# Patient Record
Sex: Female | Born: 1945 | Race: White | Hispanic: No | Smoking: Former smoker
Health system: Southern US, Community
[De-identification: ages and names within clinical notes are randomized; demographics above are authoritative.]

## PROBLEM LIST (undated history)

## (undated) ENCOUNTER — Emergency Department (HOSPITAL_COMMUNITY): Admission: EM | Discharge status: Absent without leave | Payer: Medicare Other

## (undated) DIAGNOSIS — E079 Disorder of thyroid, unspecified: Secondary | ICD-10-CM

## (undated) DIAGNOSIS — M1710 Unilateral primary osteoarthritis, unspecified knee: Secondary | ICD-10-CM

## (undated) DIAGNOSIS — E039 Hypothyroidism, unspecified: Secondary | ICD-10-CM

## (undated) DIAGNOSIS — Z87442 Personal history of urinary calculi: Secondary | ICD-10-CM

## (undated) DIAGNOSIS — R112 Nausea with vomiting, unspecified: Secondary | ICD-10-CM

## (undated) DIAGNOSIS — Z803 Family history of malignant neoplasm of breast: Secondary | ICD-10-CM

## (undated) DIAGNOSIS — Z800 Family history of malignant neoplasm of digestive organs: Secondary | ICD-10-CM

## (undated) DIAGNOSIS — Z96659 Presence of unspecified artificial knee joint: Secondary | ICD-10-CM

## (undated) DIAGNOSIS — C50411 Malignant neoplasm of upper-outer quadrant of right female breast: Secondary | ICD-10-CM

## (undated) DIAGNOSIS — H6690 Otitis media, unspecified, unspecified ear: Secondary | ICD-10-CM

## (undated) DIAGNOSIS — G8929 Other chronic pain: Secondary | ICD-10-CM

## (undated) DIAGNOSIS — E559 Vitamin D deficiency, unspecified: Secondary | ICD-10-CM

## (undated) DIAGNOSIS — Z923 Personal history of irradiation: Secondary | ICD-10-CM

## (undated) DIAGNOSIS — T7840XA Allergy, unspecified, initial encounter: Secondary | ICD-10-CM

## (undated) DIAGNOSIS — C4492 Squamous cell carcinoma of skin, unspecified: Secondary | ICD-10-CM

## (undated) DIAGNOSIS — M1990 Unspecified osteoarthritis, unspecified site: Secondary | ICD-10-CM

## (undated) DIAGNOSIS — R011 Cardiac murmur, unspecified: Secondary | ICD-10-CM

## (undated) DIAGNOSIS — Z9889 Other specified postprocedural states: Secondary | ICD-10-CM

## (undated) DIAGNOSIS — C801 Malignant (primary) neoplasm, unspecified: Secondary | ICD-10-CM

## (undated) DIAGNOSIS — C50919 Malignant neoplasm of unspecified site of unspecified female breast: Secondary | ICD-10-CM

## (undated) DIAGNOSIS — Z807 Family history of other malignant neoplasms of lymphoid, hematopoietic and related tissues: Secondary | ICD-10-CM

## (undated) DIAGNOSIS — Z170 Estrogen receptor positive status [ER+]: Secondary | ICD-10-CM

## (undated) DIAGNOSIS — J302 Other seasonal allergic rhinitis: Secondary | ICD-10-CM

## (undated) DIAGNOSIS — Z8041 Family history of malignant neoplasm of ovary: Secondary | ICD-10-CM

## (undated) DIAGNOSIS — Z836 Family history of other diseases of the respiratory system: Secondary | ICD-10-CM

## (undated) DIAGNOSIS — Z1379 Encounter for other screening for genetic and chromosomal anomalies: Secondary | ICD-10-CM

## (undated) HISTORY — PX: CATARACT EXTRACTION, BILATERAL: SHX1313

## (undated) HISTORY — DX: Malignant neoplasm of upper-outer quadrant of right female breast: C50.411

## (undated) HISTORY — DX: Family history of other malignant neoplasms of lymphoid, hematopoietic and related tissues: Z80.7

## (undated) HISTORY — DX: Otitis media, unspecified, unspecified ear: H66.90

## (undated) HISTORY — DX: Malignant neoplasm of unspecified site of unspecified female breast: C50.919

## (undated) HISTORY — DX: Estrogen receptor positive status (ER+): Z17.0

## (undated) HISTORY — DX: Family history of malignant neoplasm of ovary: Z80.41

## (undated) HISTORY — DX: Family history of other diseases of the respiratory system: Z83.6

## (undated) HISTORY — DX: Other chronic pain: G89.29

## (undated) HISTORY — DX: Presence of unspecified artificial knee joint: Z96.659

## (undated) HISTORY — DX: Unilateral primary osteoarthritis, unspecified knee: M17.10

## (undated) HISTORY — PX: TONSILLECTOMY: SUR1361

## (undated) HISTORY — DX: Vitamin D deficiency, unspecified: E55.9

## (undated) HISTORY — DX: Disorder of thyroid, unspecified: E07.9

## (undated) HISTORY — DX: Hypothyroidism, unspecified: E03.9

## (undated) HISTORY — DX: Encounter for other screening for genetic and chromosomal anomalies: Z13.79

## (undated) HISTORY — DX: Family history of malignant neoplasm of digestive organs: Z80.0

## (undated) HISTORY — DX: Malignant (primary) neoplasm, unspecified: C80.1

## (undated) HISTORY — DX: Family history of malignant neoplasm of breast: Z80.3

## (undated) HISTORY — DX: Cardiac murmur, unspecified: R01.1

## (undated) HISTORY — DX: Morbid (severe) obesity due to excess calories: E66.01

---
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
## 1999-04-22 ENCOUNTER — Other Ambulatory Visit: Admission: RE | Admit: 1999-04-22 | Discharge: 1999-04-22 | Payer: Self-pay | Admitting: Obstetrics and Gynecology

## 2000-07-20 ENCOUNTER — Other Ambulatory Visit: Admission: RE | Admit: 2000-07-20 | Discharge: 2000-07-20 | Payer: Self-pay | Admitting: Obstetrics and Gynecology

## 2001-09-08 ENCOUNTER — Other Ambulatory Visit: Admission: RE | Admit: 2001-09-08 | Discharge: 2001-09-08 | Payer: Self-pay | Admitting: Obstetrics and Gynecology

## 2003-08-29 ENCOUNTER — Encounter (HOSPITAL_COMMUNITY): Admission: RE | Admit: 2003-08-29 | Discharge: 2003-11-27 | Payer: Self-pay | Admitting: Radiology

## 2003-09-14 HISTORY — PX: BREAST LUMPECTOMY: SHX2

## 2003-09-27 ENCOUNTER — Encounter: Admission: RE | Admit: 2003-09-27 | Discharge: 2003-09-27 | Payer: Self-pay | Admitting: Surgery

## 2003-09-27 ENCOUNTER — Ambulatory Visit (HOSPITAL_COMMUNITY): Admission: RE | Admit: 2003-09-27 | Discharge: 2003-09-27 | Payer: Self-pay | Admitting: Surgery

## 2003-10-01 ENCOUNTER — Ambulatory Visit (HOSPITAL_BASED_OUTPATIENT_CLINIC_OR_DEPARTMENT_OTHER): Admission: RE | Admit: 2003-10-01 | Discharge: 2003-10-01 | Payer: Self-pay | Admitting: Surgery

## 2003-10-01 ENCOUNTER — Ambulatory Visit (HOSPITAL_COMMUNITY): Admission: RE | Admit: 2003-10-01 | Discharge: 2003-10-01 | Payer: Self-pay | Admitting: Surgery

## 2003-11-06 ENCOUNTER — Ambulatory Visit: Admission: RE | Admit: 2003-11-06 | Discharge: 2003-12-26 | Payer: Self-pay | Admitting: *Deleted

## 2003-12-28 HISTORY — PX: OTHER SURGICAL HISTORY: SHX169

## 2004-03-27 ENCOUNTER — Encounter: Admission: RE | Admit: 2004-03-27 | Discharge: 2004-03-27 | Payer: Self-pay | Admitting: Surgery

## 2004-05-19 ENCOUNTER — Ambulatory Visit: Payer: Self-pay | Admitting: Oncology

## 2004-08-25 ENCOUNTER — Encounter: Admission: RE | Admit: 2004-08-25 | Discharge: 2004-08-25 | Payer: Self-pay | Admitting: Oncology

## 2004-11-14 ENCOUNTER — Ambulatory Visit: Payer: Self-pay | Admitting: Oncology

## 2004-11-17 ENCOUNTER — Ambulatory Visit (HOSPITAL_COMMUNITY): Admission: RE | Admit: 2004-11-17 | Discharge: 2004-11-17 | Payer: Self-pay | Admitting: Oncology

## 2005-03-30 ENCOUNTER — Encounter: Admission: RE | Admit: 2005-03-30 | Discharge: 2005-03-30 | Payer: Self-pay | Admitting: Oncology

## 2005-11-02 ENCOUNTER — Encounter: Admission: RE | Admit: 2005-11-02 | Discharge: 2005-11-02 | Payer: Self-pay | Admitting: Oncology

## 2005-11-05 ENCOUNTER — Ambulatory Visit: Payer: Self-pay | Admitting: Oncology

## 2005-11-10 LAB — CBC WITH DIFFERENTIAL/PLATELET
Basophils Absolute: 0 10*3/uL (ref 0.0–0.1)
Eosinophils Absolute: 0.1 10*3/uL (ref 0.0–0.5)
HGB: 13.9 g/dL (ref 11.6–15.9)
LYMPH%: 16.6 % (ref 14.0–48.0)
MCV: 87.9 fL (ref 81.0–101.0)
MONO#: 0.5 10*3/uL (ref 0.1–0.9)
MONO%: 8 % (ref 0.0–13.0)
NEUT#: 4.7 10*3/uL (ref 1.5–6.5)
Platelets: 192 10*3/uL (ref 145–400)
WBC: 6.4 10*3/uL (ref 3.9–10.0)

## 2005-11-10 LAB — COMPREHENSIVE METABOLIC PANEL
Albumin: 4.2 g/dL (ref 3.5–5.2)
Alkaline Phosphatase: 76 U/L (ref 39–117)
BUN: 18 mg/dL (ref 6–23)
CO2: 25 mEq/L (ref 19–32)
Glucose, Bld: 98 mg/dL (ref 70–99)
Potassium: 4.5 mEq/L (ref 3.5–5.3)
Total Bilirubin: 1 mg/dL (ref 0.3–1.2)
Total Protein: 6.2 g/dL (ref 6.0–8.3)

## 2006-05-18 ENCOUNTER — Encounter: Admission: RE | Admit: 2006-05-18 | Discharge: 2006-05-18 | Payer: Self-pay | Admitting: Oncology

## 2006-08-23 ENCOUNTER — Ambulatory Visit (HOSPITAL_COMMUNITY): Admission: RE | Admit: 2006-08-23 | Discharge: 2006-08-24 | Payer: Self-pay | Admitting: General Surgery

## 2006-08-23 ENCOUNTER — Encounter (INDEPENDENT_AMBULATORY_CARE_PROVIDER_SITE_OTHER): Payer: Self-pay | Admitting: Specialist

## 2006-08-23 HISTORY — PX: APPENDECTOMY: SHX54

## 2006-11-04 ENCOUNTER — Encounter: Admission: RE | Admit: 2006-11-04 | Discharge: 2006-11-04 | Payer: Self-pay | Admitting: Family Medicine

## 2006-11-05 ENCOUNTER — Ambulatory Visit: Payer: Self-pay | Admitting: Oncology

## 2006-11-10 LAB — CBC WITH DIFFERENTIAL/PLATELET
Basophils Absolute: 0 10*3/uL (ref 0.0–0.1)
Eosinophils Absolute: 0.1 10*3/uL (ref 0.0–0.5)
HGB: 14 g/dL (ref 11.6–15.9)
MCV: 87.5 fL (ref 81.0–101.0)
MONO#: 0.8 10*3/uL (ref 0.1–0.9)
MONO%: 10.8 % (ref 0.0–13.0)
NEUT#: 4.9 10*3/uL (ref 1.5–6.5)
RBC: 4.55 10*6/uL (ref 3.70–5.32)
RDW: 13.9 % (ref 11.3–14.5)
WBC: 7.1 10*3/uL (ref 3.9–10.0)

## 2006-11-10 LAB — COMPREHENSIVE METABOLIC PANEL
Albumin: 4.4 g/dL (ref 3.5–5.2)
Alkaline Phosphatase: 78 U/L (ref 39–117)
BUN: 16 mg/dL (ref 6–23)
CO2: 26 mEq/L (ref 19–32)
Calcium: 8.8 mg/dL (ref 8.4–10.5)
Chloride: 105 mEq/L (ref 96–112)
Glucose, Bld: 96 mg/dL (ref 70–99)
Potassium: 4.4 mEq/L (ref 3.5–5.3)
Sodium: 143 mEq/L (ref 135–145)
Total Protein: 6.4 g/dL (ref 6.0–8.3)

## 2006-11-10 LAB — CANCER ANTIGEN 27.29: CA 27.29: 18 U/mL (ref 0–39)

## 2006-11-30 ENCOUNTER — Other Ambulatory Visit: Admission: RE | Admit: 2006-11-30 | Discharge: 2006-11-30 | Payer: Self-pay | Admitting: Family Medicine

## 2007-11-08 ENCOUNTER — Encounter: Admission: RE | Admit: 2007-11-08 | Discharge: 2007-11-08 | Payer: Self-pay | Admitting: Oncology

## 2007-11-21 ENCOUNTER — Ambulatory Visit: Payer: Self-pay | Admitting: Oncology

## 2007-11-23 LAB — CBC WITH DIFFERENTIAL/PLATELET
BASO%: 0.8 % (ref 0.0–2.0)
Basophils Absolute: 0.1 10*3/uL (ref 0.0–0.1)
EOS%: 1.9 % (ref 0.0–7.0)
Eosinophils Absolute: 0.1 10*3/uL (ref 0.0–0.5)
HCT: 41 % (ref 34.8–46.6)
HGB: 14.2 g/dL (ref 11.6–15.9)
LYMPH%: 25 % (ref 14.0–48.0)
MCH: 30.4 pg (ref 26.0–34.0)
MCHC: 34.6 g/dL (ref 32.0–36.0)
MCV: 87.9 fL (ref 81.0–101.0)
MONO#: 0.6 10*3/uL (ref 0.1–0.9)
MONO%: 8.7 % (ref 0.0–13.0)
NEUT#: 4.4 10*3/uL (ref 1.5–6.5)
NEUT%: 63.6 % (ref 39.6–76.8)
Platelets: 202 10*3/uL (ref 145–400)
RBC: 4.66 10*6/uL (ref 3.70–5.32)
RDW: 13.3 % (ref 11.3–14.5)
WBC: 6.9 10*3/uL (ref 3.9–10.0)
lymph#: 1.7 10*3/uL (ref 0.9–3.3)

## 2007-11-24 LAB — COMPREHENSIVE METABOLIC PANEL
ALT: 20 U/L (ref 0–35)
AST: 16 U/L (ref 0–37)
Albumin: 4.3 g/dL (ref 3.5–5.2)
Alkaline Phosphatase: 86 U/L (ref 39–117)
BUN: 16 mg/dL (ref 6–23)
CO2: 23 mEq/L (ref 19–32)
Calcium: 9 mg/dL (ref 8.4–10.5)
Chloride: 107 mEq/L (ref 96–112)
Creatinine, Ser: 0.94 mg/dL (ref 0.40–1.20)
Glucose, Bld: 97 mg/dL (ref 70–99)
Potassium: 4.2 mEq/L (ref 3.5–5.3)
Sodium: 140 mEq/L (ref 135–145)
Total Bilirubin: 0.8 mg/dL (ref 0.3–1.2)
Total Protein: 6.7 g/dL (ref 6.0–8.3)

## 2007-11-24 LAB — CANCER ANTIGEN 27.29: CA 27.29: 21 U/mL (ref 0–39)

## 2008-05-15 ENCOUNTER — Ambulatory Visit: Payer: Self-pay | Admitting: Oncology

## 2008-11-08 ENCOUNTER — Encounter: Admission: RE | Admit: 2008-11-08 | Discharge: 2008-11-08 | Payer: Self-pay | Admitting: Oncology

## 2008-11-30 ENCOUNTER — Ambulatory Visit: Payer: Self-pay | Admitting: Oncology

## 2008-12-04 LAB — COMPREHENSIVE METABOLIC PANEL
ALT: 18 U/L (ref 0–35)
AST: 18 U/L (ref 0–37)
Albumin: 4.2 g/dL (ref 3.5–5.2)
Alkaline Phosphatase: 79 U/L (ref 39–117)
BUN: 15 mg/dL (ref 6–23)
CO2: 25 mEq/L (ref 19–32)
Calcium: 9.4 mg/dL (ref 8.4–10.5)
Chloride: 105 mEq/L (ref 96–112)
Creatinine, Ser: 0.98 mg/dL (ref 0.40–1.20)
Glucose, Bld: 93 mg/dL (ref 70–99)
Potassium: 4.7 mEq/L (ref 3.5–5.3)
Sodium: 140 mEq/L (ref 135–145)
Total Bilirubin: 1 mg/dL (ref 0.3–1.2)
Total Protein: 6.6 g/dL (ref 6.0–8.3)

## 2008-12-04 LAB — CBC WITH DIFFERENTIAL/PLATELET
BASO%: 0.4 % (ref 0.0–2.0)
Basophils Absolute: 0 10*3/uL (ref 0.0–0.1)
EOS%: 3.7 % (ref 0.0–7.0)
Eosinophils Absolute: 0.2 10*3/uL (ref 0.0–0.5)
HCT: 40.9 % (ref 34.8–46.6)
HGB: 14.7 g/dL (ref 11.6–15.9)
LYMPH%: 17.9 % (ref 14.0–49.7)
MCH: 32 pg (ref 25.1–34.0)
MCHC: 35.9 g/dL (ref 31.5–36.0)
MCV: 89.2 fL (ref 79.5–101.0)
MONO#: 0.5 10*3/uL (ref 0.1–0.9)
MONO%: 8.1 % (ref 0.0–14.0)
NEUT#: 4.2 10*3/uL (ref 1.5–6.5)
NEUT%: 69.9 % (ref 38.4–76.8)
Platelets: 200 10*3/uL (ref 145–400)
RBC: 4.59 10*6/uL (ref 3.70–5.45)
RDW: 13.6 % (ref 11.2–14.5)
WBC: 6.1 10*3/uL (ref 3.9–10.3)
lymph#: 1.1 10*3/uL (ref 0.9–3.3)

## 2008-12-04 LAB — CANCER ANTIGEN 27.29: CA 27.29: 22 U/mL (ref 0–39)

## 2009-11-14 ENCOUNTER — Encounter: Admission: RE | Admit: 2009-11-14 | Discharge: 2009-11-14 | Payer: Self-pay | Admitting: Family Medicine

## 2009-12-03 ENCOUNTER — Ambulatory Visit: Payer: Self-pay | Admitting: Oncology

## 2010-09-25 ENCOUNTER — Ambulatory Visit: Payer: PRIVATE HEALTH INSURANCE | Attending: Family Medicine | Admitting: Physical Therapy

## 2010-09-25 DIAGNOSIS — IMO0001 Reserved for inherently not codable concepts without codable children: Secondary | ICD-10-CM | POA: Insufficient documentation

## 2010-09-25 DIAGNOSIS — M25519 Pain in unspecified shoulder: Secondary | ICD-10-CM | POA: Insufficient documentation

## 2010-09-25 DIAGNOSIS — M25619 Stiffness of unspecified shoulder, not elsewhere classified: Secondary | ICD-10-CM | POA: Insufficient documentation

## 2010-09-30 ENCOUNTER — Ambulatory Visit: Payer: PRIVATE HEALTH INSURANCE | Admitting: Physical Therapy

## 2010-10-02 ENCOUNTER — Ambulatory Visit: Payer: PRIVATE HEALTH INSURANCE | Admitting: Physical Therapy

## 2010-10-07 ENCOUNTER — Ambulatory Visit: Payer: PRIVATE HEALTH INSURANCE | Admitting: Physical Therapy

## 2010-10-09 ENCOUNTER — Ambulatory Visit: Payer: PRIVATE HEALTH INSURANCE | Admitting: Physical Therapy

## 2010-10-14 ENCOUNTER — Ambulatory Visit: Payer: PRIVATE HEALTH INSURANCE | Attending: Family Medicine | Admitting: Physical Therapy

## 2010-10-14 DIAGNOSIS — M25619 Stiffness of unspecified shoulder, not elsewhere classified: Secondary | ICD-10-CM | POA: Insufficient documentation

## 2010-10-14 DIAGNOSIS — IMO0001 Reserved for inherently not codable concepts without codable children: Secondary | ICD-10-CM | POA: Insufficient documentation

## 2010-10-14 DIAGNOSIS — M25519 Pain in unspecified shoulder: Secondary | ICD-10-CM | POA: Insufficient documentation

## 2010-10-16 ENCOUNTER — Other Ambulatory Visit: Payer: Self-pay | Admitting: Family Medicine

## 2010-10-16 ENCOUNTER — Ambulatory Visit: Payer: PRIVATE HEALTH INSURANCE | Admitting: Physical Therapy

## 2010-10-16 DIAGNOSIS — Z9889 Other specified postprocedural states: Secondary | ICD-10-CM

## 2010-10-21 ENCOUNTER — Ambulatory Visit: Payer: PRIVATE HEALTH INSURANCE | Admitting: Physical Therapy

## 2010-10-23 ENCOUNTER — Encounter: Payer: PRIVATE HEALTH INSURANCE | Admitting: Physical Therapy

## 2010-10-28 ENCOUNTER — Ambulatory Visit: Payer: PRIVATE HEALTH INSURANCE | Admitting: Physical Therapy

## 2010-10-31 NOTE — Op Note (Signed)
Elizabeth Ramirez, Elizabeth Ramirez              ACCOUNT NO.:  [id number]   MEDICAL RECORD NO.:  [id number]          PATIENT TYPE:  AMB   LOCATION:  DAY                          FACILITY:  [facility]   PHYSICIAN:  Adolph Pollack, M.D.DATE OF BIRTH:  14-Jul-1945   DATE OF PROCEDURE:  08/23/2006  DATE OF DISCHARGE:                               OPERATIVE REPORT   PREOPERATIVE DIAGNOSIS:  Acute appendicitis.   POSTOPERATIVE DIAGNOSIS:  Acute appendicitis.   PROCEDURE:  Laparoscopic appendectomy.   SURGEON:  Adolph Pollack, M.D.   ANESTHESIA:  General.   INDICATIONS:  This 65 year old female presented to Dr. Bascom Levels office  with increasing right lower quadrant pain.  Evaluation demonstrated  white blood cell count 15,400 and a CT scan was consistent with acute  appendicitis.  She was brought to [facility] and is now  brought to the operating room for laparoscopic and possible open  appendectomy.   TECHNIQUE:  She was brought to the operating room, placed supine on the  operating table and general anesthetic was administered.  A Foley  catheter placed in the bladder.  The abdominal wall sterilely prepped  and draped.  Dilute Marcaine solution was infiltrated in the  subumbilical region.  A subumbilical incision was made through skin and  subcutaneous tissue.  She is obese so she had a very deep abdominal  wall.  An incision was made through the anterior fascia, posterior  fascia and peritoneum and peritoneal cavity was entered.  0-0 Vicryl  placed around the fascia of both.  A Hassan trocar was introduced into  the peritoneal cavity and pneumoperitoneum created by insufflation of  CO2 gas.   The laparoscope was introduced.  A 5 mm trocar was then placed into the  left lower quadrant and one in the right upper quadrant.  I retracted  the cecum medially exposing the appendix which was adherent to the  antimesenteric fat pad of the distal ileum.  I carefully separated this  using  blunt dissection then divided the mesoappendix with a harmonic  scalpel down to the interface between the appendix and cecum.  The  appendix was then amputated off the cecum with the GIA stapler.  The  appendix was put an Endopouch bag and removed through the subumbilical  port.   Following this I copiously irrigated out the right lower quadrant  region.  Of note was that there was no evidence of perforation of the  appendix.  I then evacuated fluid.  The staple line was solid without  evidence of bleeding.  The appendix had been removed in the Endopouch  bag through the subumbilical port and that trocar had been replaced.  I  then removed that subumbilical trocar.  A pursestring suture of 0-0  Vicryl had been placed and once it was tied down.  There was still a  fascia defect at the inferior portion the incision.  I then attempted to  use the Endoclose device; however, my trocar configuration did not allow  me to grasp the suture very well.  I subsequently abandoned this and  grasped the fascial edges with  Kochers and using a larger size needle  with 0-0 Vicryl, I was able to close this with two interrupted sutures.  I reinspected the abdominal cavity after closure.  There was no evidence  of bleeding or visceral injury.  Following this, remaining trocars were  removed and the CO2 gas released.  The skin incisions were closed with 4-  0 Monocryl subcuticular stitches followed by Steri-Strips and sterile  dressings.   She tolerated procedure well without any apparent complications and was  taken to recovery in satisfactory condition.      Adolph Pollack, M.D.  Electronically Signed     TJR/MEDQ  D:  08/23/2006  T:  08/24/2006  Job:  [id number]   cc:   Al Decant. Janey Greaser, MD  Fax: [phone]

## 2010-11-04 ENCOUNTER — Ambulatory Visit: Payer: PRIVATE HEALTH INSURANCE | Admitting: Physical Therapy

## 2010-11-06 ENCOUNTER — Ambulatory Visit: Payer: PRIVATE HEALTH INSURANCE | Admitting: Physical Therapy

## 2010-11-13 ENCOUNTER — Ambulatory Visit: Payer: PRIVATE HEALTH INSURANCE | Admitting: Physical Therapy

## 2010-11-18 ENCOUNTER — Encounter: Payer: PRIVATE HEALTH INSURANCE | Admitting: Physical Therapy

## 2010-11-19 ENCOUNTER — Encounter: Payer: PRIVATE HEALTH INSURANCE | Admitting: Physical Therapy

## 2010-11-20 ENCOUNTER — Ambulatory Visit: Payer: PRIVATE HEALTH INSURANCE | Attending: Family Medicine | Admitting: Physical Therapy

## 2010-11-20 DIAGNOSIS — IMO0001 Reserved for inherently not codable concepts without codable children: Secondary | ICD-10-CM | POA: Insufficient documentation

## 2010-11-20 DIAGNOSIS — M25519 Pain in unspecified shoulder: Secondary | ICD-10-CM | POA: Insufficient documentation

## 2010-11-20 DIAGNOSIS — M25619 Stiffness of unspecified shoulder, not elsewhere classified: Secondary | ICD-10-CM | POA: Insufficient documentation

## 2010-11-24 ENCOUNTER — Ambulatory Visit
Admission: RE | Admit: 2010-11-24 | Discharge: 2010-11-24 | Disposition: A | Discharge status: Home / Self Care | Payer: PRIVATE HEALTH INSURANCE | Source: Ambulatory Visit | Attending: Family Medicine | Admitting: Family Medicine

## 2010-11-24 DIAGNOSIS — Z9889 Other specified postprocedural states: Secondary | ICD-10-CM

## 2011-07-29 ENCOUNTER — Ambulatory Visit (INDEPENDENT_AMBULATORY_CARE_PROVIDER_SITE_OTHER): Payer: PRIVATE HEALTH INSURANCE | Admitting: Internal Medicine

## 2011-07-29 DIAGNOSIS — E039 Hypothyroidism, unspecified: Secondary | ICD-10-CM

## 2011-07-29 DIAGNOSIS — J329 Chronic sinusitis, unspecified: Secondary | ICD-10-CM

## 2011-07-29 DIAGNOSIS — R05 Cough: Secondary | ICD-10-CM

## 2011-07-29 DIAGNOSIS — J019 Acute sinusitis, unspecified: Secondary | ICD-10-CM

## 2011-07-29 HISTORY — DX: Hypothyroidism, unspecified: E03.9

## 2011-07-29 MED ORDER — HYDROCODONE-ACETAMINOPHEN 7.5-500 MG/15ML PO SOLN: 5.0000 mL | Freq: Four times a day (QID) | ORAL | Status: AC | PRN

## 2011-07-29 MED ORDER — AMOXICILLIN 500 MG PO CAPS: 1000.0000 mg | ORAL_CAPSULE | Freq: Two times a day (BID) | ORAL | Status: AC

## 2011-07-29 NOTE — Progress Notes (Signed)
Subjective:     Patient ID: Elizabeth Ramirez, female   DOB: March 31, 1946, 66 y.o.   MRN: [id number]  HPI Head congestion with a cough. Discharge is yellow-green.No SOB,CP.  Review of Systems     Objective:   Physical Exam  Constitutional: She is oriented to person, place, and time. She appears well-nourished.  HENT:  Right Ear: External ear normal.  Left Ear: External ear normal.  Nose: Mucosal edema, rhinorrhea and sinus tenderness present. Right sinus exhibits maxillary sinus tenderness and frontal sinus tenderness. Left sinus exhibits maxillary sinus tenderness and frontal sinus tenderness.  Mouth/Throat: No oropharyngeal exudate.  Cardiovascular: S1 normal, S2 normal and normal heart sounds.   Pulmonary/Chest: Effort normal and breath sounds normal. No respiratory distress.  Neurological: She is alert and oriented to person, place, and time. She has normal strength. Coordination normal.       Mild HA       Assessment:     Sinusitis Cough    Plan:     Amoxil 1g bid 10d lortab 6oz prn cough     RTC prn worse

## 2011-11-17 ENCOUNTER — Other Ambulatory Visit: Payer: Self-pay | Admitting: Family Medicine

## 2011-11-17 DIAGNOSIS — Z853 Personal history of malignant neoplasm of breast: Secondary | ICD-10-CM

## 2011-12-03 ENCOUNTER — Ambulatory Visit
Admission: RE | Admit: 2011-12-03 | Discharge: 2011-12-03 | Disposition: A | Discharge status: Home / Self Care | Payer: PRIVATE HEALTH INSURANCE | Source: Ambulatory Visit | Attending: Family Medicine | Admitting: Family Medicine

## 2011-12-03 DIAGNOSIS — Z853 Personal history of malignant neoplasm of breast: Secondary | ICD-10-CM

## 2012-04-06 ENCOUNTER — Ambulatory Visit: Payer: PRIVATE HEALTH INSURANCE | Admitting: Family Medicine

## 2012-10-06 ENCOUNTER — Ambulatory Visit (INDEPENDENT_AMBULATORY_CARE_PROVIDER_SITE_OTHER): Payer: PRIVATE HEALTH INSURANCE | Admitting: Physician Assistant

## 2012-10-06 ENCOUNTER — Encounter: Payer: Self-pay | Admitting: Physician Assistant

## 2012-10-06 VITALS — BP 156/88 | HR 83 | Temp 98.0°F | Resp 16 | Ht 64.0 in | Wt 248.0 lb

## 2012-10-06 DIAGNOSIS — N390 Urinary tract infection, site not specified: Secondary | ICD-10-CM

## 2012-10-06 DIAGNOSIS — R3915 Urgency of urination: Secondary | ICD-10-CM

## 2012-10-06 LAB — POCT URINALYSIS DIPSTICK
Glucose, UA: NEGATIVE
Nitrite, UA: POSITIVE
Protein, UA: NEGATIVE
Urobilinogen, UA: 0.2

## 2012-10-06 LAB — POCT UA - MICROSCOPIC ONLY
Bacteria, U Microscopic: NEGATIVE
Casts, Ur, LPF, POC: NEGATIVE
Crystals, Ur, HPF, POC: NEGATIVE
Epithelial cells, urine per micros: NEGATIVE
Mucus, UA: NEGATIVE
Yeast, UA: NEGATIVE

## 2012-10-06 MED ORDER — SULFAMETHOXAZOLE-TRIMETHOPRIM 800-160 MG PO TABS: 1.0000 | ORAL_TABLET | Freq: Two times a day (BID) | ORAL | Status: DC

## 2012-10-06 NOTE — Progress Notes (Signed)
Subjective:    Patient ID: Elizabeth Ramirez, female    DOB: [birth year], 67 y.o.   MRN: [id number]  HPI    Elizabeth Ramirez is a very pleasant 68 yr old female here because she feels like she has a UTI.  Noted urgency that started last night.  Also frequency and pressure.  Took Azo around midnight last night which helped with symptoms.  Denies dysuria or hematuria.  Has been trying to increase fluid intake.  States she gets these every couple years.  Denies vaginal symptoms.  Denies nausea, vomiting, back pain.  Some chills yesterday but no fever.     Review of Systems  Constitutional: Positive for chills. Negative for fever.  HENT: Negative.   Respiratory: Negative.   Cardiovascular: Negative.   Gastrointestinal: Positive for abdominal pain (lower abd, pressure). Negative for nausea and vomiting.  Genitourinary: Positive for urgency and frequency. Negative for dysuria, hematuria, flank pain and vaginal discharge.  Neurological: Negative.        Objective:   Physical Exam  Vitals reviewed. Constitutional: She is oriented to person, place, and time. She appears well-developed and well-nourished. No distress.  HENT:  Head: Normocephalic and atraumatic.  Eyes: Conjunctivae are normal. No scleral icterus.  Cardiovascular: Normal rate, regular rhythm and normal heart sounds.   Pulmonary/Chest: Effort normal and breath sounds normal. She has no wheezes. She has no rales.  Abdominal: Soft. Bowel sounds are normal. There is no tenderness. There is no CVA tenderness.  Neurological: She is alert and oriented to person, place, and time.  Skin: Skin is warm and dry.  Psychiatric: She has a normal mood and affect. Her behavior is normal.     Filed Vitals:   10/06/12 1037  BP: 156/88  Pulse: 83  Temp: 98 F (36.7 C)  Resp: 16     Results for orders placed in visit on 10/06/12  POCT UA - MICROSCOPIC ONLY      Result Value Range   WBC, Ur, HPF, POC 1-5     RBC, urine, microscopic neg     Bacteria, U Microscopic neg     Mucus, UA neg     Epithelial cells, urine per micros neg     Crystals, Ur, HPF, POC neg     Casts, Ur, LPF, POC neg     Yeast, UA neg    POCT URINALYSIS DIPSTICK      Result Value Range   Color, UA orange     Clarity, UA clear     Glucose, UA neg     Bilirubin, UA neg     Ketones, UA neg     Spec Grav, UA 1.010     Blood, UA neg     pH, UA 6.0     Protein, UA neg     Urobilinogen, UA 0.2     Nitrite, UA positive     Leukocytes, UA small (1+)          Assessment & Plan:  UTI (urinary tract infection) - Plan: Urine culture, sulfamethoxazole-trimethoprim (BACTRIM DS,SEPTRA DS) 800-160 MG per tablet  Urinary urgency - Plan: POCT UA - Microscopic Only, POCT urinalysis dipstick   Elizabeth Ramirez is a very pleasant 67 yr old female with UTI.  UA shows nitrite and leuks.  Will send cultures.  Pt with allergies to quinolones and tetracycline.  Has used Bactrim in the past with no issues.  Will treat with Bactrim x 5 days.  May continue Azo for the next  1-2 days.  Push fluids.  Will follow up on culture and alter therapy if necessary.  Pt understands and is in agreement with this plan.

## 2012-10-06 NOTE — Patient Instructions (Addendum)
Begin taking the antibiotic as directed.  Be sure to finish the full course.  Plenty of fluids (water is best!)  Continue Azo for the next 1-2 days if needed.  I will let you know when your culture is back and if we need to make any changes to your antibiotic.  Please let me know if any symptoms are worsening or not improving.    Urinary Tract Infection Urinary tract infections (UTIs) can develop anywhere along your urinary tract. Your urinary tract is your body's drainage system for removing wastes and extra water. Your urinary tract includes two kidneys, two ureters, a bladder, and a urethra. Your kidneys are a pair of bean-shaped organs. Each kidney is about the size of your fist. They are located below your ribs, one on each side of your spine. CAUSES Infections are caused by microbes, which are microscopic organisms, including fungi, viruses, and bacteria. These organisms are so small that they can only be seen through a microscope. Bacteria are the microbes that most commonly cause UTIs. SYMPTOMS  Symptoms of UTIs may vary by age and gender of the patient and by the location of the infection. Symptoms in young women typically include a frequent and intense urge to urinate and a painful, burning feeling in the bladder or urethra during urination. Older women and men are more likely to be tired, shaky, and weak and have muscle aches and abdominal pain. A fever may mean the infection is in your kidneys. Other symptoms of a kidney infection include pain in your back or sides below the ribs, nausea, and vomiting. DIAGNOSIS To diagnose a UTI, your caregiver will ask you about your symptoms. Your caregiver also will ask to provide a urine sample. The urine sample will be tested for bacteria and white blood cells. White blood cells are made by your body to help fight infection. TREATMENT  Typically, UTIs can be treated with medication. Because most UTIs are caused by a bacterial infection, they usually  can be treated with the use of antibiotics. The choice of antibiotic and length of treatment depend on your symptoms and the type of bacteria causing your infection. HOME CARE INSTRUCTIONS  If you were prescribed antibiotics, take them exactly as your caregiver instructs you. Finish the medication even if you feel better after you have only taken some of the medication.  Drink enough water and fluids to keep your urine clear or pale yellow.  Avoid caffeine, tea, and carbonated beverages. They tend to irritate your bladder.  Empty your bladder often. Avoid holding urine for long periods of time.  Empty your bladder before and after sexual intercourse.  After a bowel movement, women should cleanse from front to back. Use each tissue only once. SEEK MEDICAL CARE IF:   You have back pain.  You develop a fever.  Your symptoms do not begin to resolve within 3 days. SEEK IMMEDIATE MEDICAL CARE IF:   You have severe back pain or lower abdominal pain.  You develop chills.  You have nausea or vomiting.  You have continued burning or discomfort with urination. MAKE SURE YOU:   Understand these instructions.  Will watch your condition.  Will get help right away if you are not doing well or get worse. Document Released: 03/11/2005 Document Revised: 12/01/2011 Document Reviewed: 07/10/2011 [facility] Patient Information 2013 [place], [place].

## 2012-10-08 LAB — URINE CULTURE: Colony Count: 30000

## 2012-10-10 ENCOUNTER — Telehealth: Payer: Self-pay

## 2012-10-10 DIAGNOSIS — N390 Urinary tract infection, site not specified: Secondary | ICD-10-CM

## 2012-10-10 MED ORDER — SULFAMETHOXAZOLE-TRIMETHOPRIM 800-160 MG PO TABS: 1.0000 | ORAL_TABLET | Freq: Two times a day (BID) | ORAL | Status: DC

## 2012-10-10 NOTE — Telephone Encounter (Signed)
Thanks. Called her to advise.  

## 2012-10-10 NOTE — Telephone Encounter (Signed)
Called her, levaquin reaction was tendonitis, c/o persistant pain after this was 2007,(pain continues still ) she does want any quinolones.

## 2012-10-10 NOTE — Telephone Encounter (Signed)
Pt is calling back about lab results.  Call back number is [phone] she said that you could leave a message on her voicemail

## 2012-10-10 NOTE — Telephone Encounter (Signed)
Sent in rx for 3 more days of Septra

## 2012-10-10 NOTE — Telephone Encounter (Signed)
Has she taken Cipro before? Did she have tendon rupture with levaquin? We may need to change to this if she is still having symptoms.

## 2012-10-10 NOTE — Telephone Encounter (Signed)
Patient given information about the culture/ finishing Antibiotic, she takes the last one today, still has symptoms, burning/ urgency, please advise. She states okay to leave message. [facility] culture note as follows: Please let pt know that her urine culture did grow bacteria. The bacteria should be susceptible to the medication she is on. Encourage her to finish the full course.

## 2012-11-02 ENCOUNTER — Other Ambulatory Visit: Payer: Self-pay

## 2012-11-02 DIAGNOSIS — Z1231 Encounter for screening mammogram for malignant neoplasm of breast: Secondary | ICD-10-CM

## 2012-12-05 ENCOUNTER — Other Ambulatory Visit: Payer: Self-pay

## 2012-12-05 ENCOUNTER — Ambulatory Visit: Admission: RE | Admit: 2012-12-05 | Payer: PRIVATE HEALTH INSURANCE | Source: Ambulatory Visit

## 2012-12-05 ENCOUNTER — Ambulatory Visit
Admission: RE | Admit: 2012-12-05 | Discharge: 2012-12-05 | Disposition: A | Discharge status: Home / Self Care | Payer: No Typology Code available for payment source | Source: Ambulatory Visit

## 2012-12-05 DIAGNOSIS — Z1231 Encounter for screening mammogram for malignant neoplasm of breast: Secondary | ICD-10-CM

## 2012-12-08 ENCOUNTER — Ambulatory Visit: Payer: No Typology Code available for payment source

## 2013-02-09 ENCOUNTER — Ambulatory Visit (INDEPENDENT_AMBULATORY_CARE_PROVIDER_SITE_OTHER): Payer: PRIVATE HEALTH INSURANCE | Admitting: Family Medicine

## 2013-02-09 VITALS — BP 130/76 | HR 97 | Temp 97.9°F | Resp 20 | Ht 63.0 in | Wt 252.4 lb

## 2013-02-09 DIAGNOSIS — J019 Acute sinusitis, unspecified: Secondary | ICD-10-CM

## 2013-02-09 DIAGNOSIS — R05 Cough: Secondary | ICD-10-CM

## 2013-02-09 DIAGNOSIS — R059 Cough, unspecified: Secondary | ICD-10-CM

## 2013-02-09 MED ORDER — IPRATROPIUM BROMIDE 0.03 % NA SOLN: 2.0000 | Freq: Two times a day (BID) | NASAL | Status: DC

## 2013-02-09 MED ORDER — HYDROCODONE-HOMATROPINE 5-1.5 MG/5ML PO SYRP: 5.0000 mL | ORAL_SOLUTION | Freq: Every evening | ORAL | Status: DC | PRN

## 2013-02-09 MED ORDER — AMOXICILLIN-POT CLAVULANATE 875-125 MG PO TABS: 1.0000 | ORAL_TABLET | Freq: Two times a day (BID) | ORAL | Status: DC

## 2013-02-09 NOTE — Patient Instructions (Signed)

## 2013-02-09 NOTE — Progress Notes (Signed)
 [facility]:  Office Visit  Chief Complaint:  Chief Complaint  Patient presents with  . Sinus Problem    x 1 week    HPI: Elizabeth Ramirez is a 67 y.o. female who complains of 1 week history of sinus sxs and has had productive yellow/green sputum and is mostly at night. She feels congested. She tried otc meds and also fluticasone nasal spray but stopped due to nose bleeds. She has been taking Claritin D and Mucinex without relief. Denies ear pain. + facial pain   Past Medical History  Diagnosis Date  . Thyroid disease   . Cancer    Past Surgical History  Procedure Laterality Date  . Appendectomy    . Brain surgery     History   Social History  . Marital Status: Single    Spouse Name: N/A    Number of Children: N/A  . Years of Education: N/A   Social History Main Topics  . Smoking status: Former Smoker    Quit date: 06/16/1983  . Smokeless tobacco: None  . Alcohol Use: None  . Drug Use: None  . Sexual Activity: None   Other Topics Concern  . None   Social History Narrative  . None   Family History  Problem Relation Age of Onset  . Dementia Mother   . Pulmonary fibrosis Father   . Diabetes Brother   . Thyroid disease Brother   . Cancer Maternal Grandmother   . Stroke Maternal Grandfather   . Diabetes Paternal Grandfather    Allergies  Allergen Reactions  . Levofloxacin Other (See Comments)    Affected tendons  . Tetracyclines & Related Swelling    tongue swelling and peeling   Prior to Admission medications   Medication Sig Start Date End Date Taking? Authorizing Provider  Calcium-Vitamin D (CALTRATE 600 PLUS-VIT D PO) Take by mouth.   Yes Historical Provider, MD  diphenhydrAMINE (BENADRYL ALLERGY) 25 MG tablet Take 25 mg by mouth every 4 (four) hours.   Yes Historical Provider, MD  levothyroxine (SYNTHROID, LEVOTHROID) 150 MCG tablet Take 150 mcg by mouth daily.   Yes Historical Provider, MD  Acetaminophen (TYLENOL PO) Take by  mouth every 4 (four) hours as needed.    Historical Provider, MD  phenazopyridine (PYRIDIUM) 95 MG tablet Take 95 mg by mouth 3 (three) times daily as needed for pain.    Historical Provider, MD  sulfamethoxazole-trimethoprim (BACTRIM DS,SEPTRA DS) 800-160 MG per tablet Take 1 tablet by mouth 2 (two) times daily. 10/10/12   Heather Jaquita Rector, PA-C     ROS: The patient denies fevers, chills, night sweats, unintentional weight loss, chest pain, palpitations, wheezing, dyspnea on exertion, nausea, vomiting, abdominal pain, dysuria, hematuria, melena, numbness, weakness, or tingling.   All other systems have been reviewed and were otherwise negative with the exception of those mentioned in the HPI and as above.    PHYSICAL EXAM: Filed Vitals:   02/09/13 1606  BP: 130/76  Pulse: 97  Temp: 97.9 F (36.6 C)  Resp: 20   Filed Vitals:   02/09/13 1606  Height: 5\' 3"  (1.6 m)  Weight: 252 lb 6.4 oz (114.488 kg)   Body mass index is 44.72 kg/(m^2).  General: Alert, no acute distress HEENT:  Normocephalic, atraumatic, oropharynx patent. EOMI, PERRLA, + sinus tenderness, no Exudates. TM , + blood in nasal passage left nostril Cardiovascular:  Regular rate and rhythm, no rubs murmurs or gallops.  No Carotid bruits, radial pulse intact.  No pedal edema.  Respiratory: Clear to auscultation bilaterally.  No wheezes, rales, or rhonchi.  No cyanosis, no use of accessory musculature GI: No organomegaly, abdomen is soft and non-tender, positive bowel sounds.  No masses. Skin: No rashes. Neurologic: Facial musculature symmetric. Psychiatric: Patient is appropriate throughout our interaction. Lymphatic: No cervical lymphadenopathy Musculoskeletal: Gait intact.   LABS:    EKG/XRAY:   Primary read interpreted by Dr. Conley Rolls at [facility].   ASSESSMENT/PLAN: Encounter Diagnoses  Name Primary?  . Sinusitis, acute Yes  . Cough    Rx Augmentin Rx Hycodan F/u prn Gross sideeffects, risk and benefits, and  alternatives of medications d/w patient. Patient is aware that all medications have potential sideeffects and we are unable to predict every sideeffect or drug-drug interaction that may occur.  ,  PHUONG, DO 02/09/2013 5:50 PM

## 2013-04-20 ENCOUNTER — Other Ambulatory Visit: Payer: Self-pay

## 2013-04-28 ENCOUNTER — Ambulatory Visit (INDEPENDENT_AMBULATORY_CARE_PROVIDER_SITE_OTHER): Payer: PRIVATE HEALTH INSURANCE | Admitting: Emergency Medicine

## 2013-04-28 VITALS — BP 124/82 | HR 96 | Temp 98.0°F | Resp 16 | Ht 63.5 in | Wt 240.0 lb

## 2013-04-28 DIAGNOSIS — J209 Acute bronchitis, unspecified: Secondary | ICD-10-CM

## 2013-04-28 DIAGNOSIS — J018 Other acute sinusitis: Secondary | ICD-10-CM

## 2013-04-28 MED ORDER — AMOXICILLIN-POT CLAVULANATE ER 1000-62.5 MG PO TB12: 2.0000 | ORAL_TABLET | Freq: Two times a day (BID) | ORAL | Status: DC

## 2013-04-28 MED ORDER — HYDROCOD POLST-CHLORPHEN POLST 10-8 MG/5ML PO LQCR: 5.0000 mL | Freq: Two times a day (BID) | ORAL | Status: DC | PRN

## 2013-04-28 MED ORDER — PSEUDOEPHEDRINE-GUAIFENESIN ER 60-600 MG PO TB12: 1.0000 | ORAL_TABLET | Freq: Two times a day (BID) | ORAL | Status: DC

## 2013-04-28 MED ORDER — ALBUTEROL SULFATE HFA 108 (90 BASE) MCG/ACT IN AERS: 2.0000 | INHALATION_SPRAY | RESPIRATORY_TRACT | Status: DC | PRN

## 2013-04-28 NOTE — Patient Instructions (Signed)
Metered Dose Inhaler (No Spacer Used) Inhaled medicines are the basis of asthma treatment and other breathing problems. Inhaled medicine can only be effective if used properly. Good technique assures that the medicine reaches the lungs. Metered dose inhalers (MDIs) are used to deliver a variety of inhaled medicines. These include quick relief or rescue medicines (such as bronchodilators) and controller medicines (such as corticosteroids). The medicine is delivered by pushing down on a metal canister to release a set amount of spray. If you are using different kinds of inhalers, use your quick relief medicine to open the airways 10 15 minutes before using a steroid if instructed to do so by your health care provider. If you are unsure which inhalers to use and the order of using them, ask your health care provider, nurse, or respiratory therapist. HOW TO USE THE INHALER 1. Remove cap from inhaler. 2. If you are using the inhaler for the first time, you will need to prime it. Shake the inhaler for 5 seconds and release four puffs into the air, away from your face. Ask your health care provider or pharmacist if you have questions about priming your inhaler. 3. Shake inhaler for 5 seconds before each breath in (inhalation). 4. Position the inhaler so that the top of the canister faces up. 5. Put your index finger on the top of the medicine canister. Your thumb supports the bottom of the inhaler. 6. Open your mouth. 7. Either place the inhaler between your teeth and place your lips tightly around the mouthpiece, or hold the inhaler 1 2 inches away from your open mouth. If you are unsure of which technique to use, ask your health care provider. 8. Breathe out (exhale) normally and as completely as possible. 9. Press the canister down with the index finger to release the medicine. 10. At the same time as the canister is pressed, inhale deeply and slowly until the lungs are completely filled. This should take  4 6 seconds. Keep your tongue down. 11. Hold the medicine in your lungs for up to 5 10 seconds (10 seconds is best). This helps the medicine get into the small airways of your lungs. 12. Breathe out slowly, through pursed lips. Whistling is an example of pursed lips. 13. Wait at least 1 minute between puffs. Continue with the above steps until you have taken the number of puffs your health care provider has ordered. Do not use the inhaler more than your health care provider directs you to. 14. Replace cap on inhaler. 15. Follow the directions from your health care provider or the inhaler insert for cleaning the inhaler. If you are using a steroid inhaler, rinse your mouth with water after your last puff, gargle, and spit out the water. Do not swallow the water. AVOID:  Inhaling before or after starting the spray of medicine. It takes practice to coordinate your breathing with triggering the spray.  Inhaling through the nose (rather than the mouth) when triggering the spray. HOW TO DETERMINE IF YOUR INHALER IS FULL OR NEARLY EMPTY You cannot know when an inhaler is empty by shaking it. A few inhalers are now being made with dose counters. Ask your health care provider for a prescription that has a dose counter if you feel you need that extra help. If your inhaler does not have a counter, ask your health care provider to help you determine the date you need to refill your inhaler. Write the refill date on a calendar or your inhaler canister.   Refill your inhaler 7 10 days before it runs out. Be sure to keep an adequate supply of medicine. This includes making sure it is not expired, and you have a spare inhaler.  SEEK MEDICAL CARE IF:   Symptoms are only partially relieved with your inhaler.  You are having trouble using your inhaler.  You experience some increase in phlegm. SEEK IMMEDIATE MEDICAL CARE IF:   You feel little or no relief with your inhalers. You are still wheezing and are feeling  shortness of breath or tightness in your chest or both.  You have dizziness, headaches, or fast heart rate.  You have chills, fever, or night sweats.  There is a noticeable increase in phlegm production, or there is blood in the phlegm. Document Released: 03/29/2007 Document Revised: 02/01/2013 Document Reviewed: 11/17/2012 [facility] Patient Information 2014 [place], [place]. Sinusitis Sinusitis is redness, soreness, and swelling (inflammation) of the paranasal sinuses. Paranasal sinuses are air pockets within the bones of your face (beneath the eyes, the middle of the forehead, or above the eyes). In healthy paranasal sinuses, mucus is able to drain out, and air is able to circulate through them by way of your nose. However, when your paranasal sinuses are inflamed, mucus and air can become trapped. This can allow bacteria and other germs to grow and cause infection. Sinusitis can develop quickly and last only a short time (acute) or continue over a long period (chronic). Sinusitis that lasts for more than 12 weeks is considered chronic.  CAUSES  Causes of sinusitis include:  Allergies.  Structural abnormalities, such as displacement of the cartilage that separates your nostrils (deviated septum), which can decrease the air flow through your nose and sinuses and affect sinus drainage.  Functional abnormalities, such as when the small hairs (cilia) that line your sinuses and help remove mucus do not work properly or are not present. SYMPTOMS  Symptoms of acute and chronic sinusitis are the same. The primary symptoms are pain and pressure around the affected sinuses. Other symptoms include:  Upper toothache.  Earache.  Headache.  Bad breath.  Decreased sense of smell and taste.  A cough, which worsens when you are lying flat.  Fatigue.  Fever.  Thick drainage from your nose, which often is green and may contain pus (purulent).  Swelling and warmth over the affected  sinuses. DIAGNOSIS  Your caregiver will perform a physical exam. During the exam, your caregiver may:  Look in your nose for signs of abnormal growths in your nostrils (nasal polyps).  Tap over the affected sinus to check for signs of infection.  View the inside of your sinuses (endoscopy) with a special imaging device with a light attached (endoscope), which is inserted into your sinuses. If your caregiver suspects that you have chronic sinusitis, one or more of the following tests may be recommended:  Allergy tests.  Nasal culture A sample of mucus is taken from your nose and sent to a lab and screened for bacteria.  Nasal cytology A sample of mucus is taken from your nose and examined by your caregiver to determine if your sinusitis is related to an allergy. TREATMENT  Most cases of acute sinusitis are related to a viral infection and will resolve on their own within 10 days. Sometimes medicines are prescribed to help relieve symptoms (pain medicine, decongestants, nasal steroid sprays, or saline sprays).  However, for sinusitis related to a bacterial infection, your caregiver will prescribe antibiotic medicines. These are medicines that will help kill the  bacteria causing the infection.  Rarely, sinusitis is caused by a fungal infection. In theses cases, your caregiver will prescribe antifungal medicine. For some cases of chronic sinusitis, surgery is needed. Generally, these are cases in which sinusitis recurs more than 3 times per year, despite other treatments. HOME CARE INSTRUCTIONS   Drink plenty of water. Water helps thin the mucus so your sinuses can drain more easily.  Use a humidifier.  Inhale steam 3 to 4 times a day (for example, sit in the bathroom with the shower running).  Apply a warm, moist washcloth to your face 3 to 4 times a day, or as directed by your caregiver.  Use saline nasal sprays to help moisten and clean your sinuses.  Take over-the-counter or  prescription medicines for pain, discomfort, or fever only as directed by your caregiver. SEEK IMMEDIATE MEDICAL CARE IF:  You have increasing pain or severe headaches.  You have nausea, vomiting, or drowsiness.  You have swelling around your face.  You have vision problems.  You have a stiff neck.  You have difficulty breathing. MAKE SURE YOU:   Understand these instructions.  Will watch your condition.  Will get help right away if you are not doing well or get worse. Document Released: 06/01/2005 Document Revised: 08/24/2011 Document Reviewed: 06/16/2011 [facility] Patient Information 2014 [place], [place].

## 2013-04-28 NOTE — Progress Notes (Signed)
[facility] [address], [place] [address] [phone]  Date:  04/28/2013   Name:  Elizabeth Ramirez   DOB:  [birth year]   MRN:  [id number]  PCP:  Gweneth Dimitri, MD    Chief Complaint: Sinus Problem   History of Present Illness:  Elizabeth Ramirez is a 67 y.o. very pleasant female patient who presents with the following:  Ill for a week with nasal congestion and purulent nasal discharge and post nasal drip.  Has a mild sore throat.  No fever or chills.  Has a cough productive of purulent sputum.  No wheezing or shortness of breath.  No nausea or vomiting.  No stool change of rash.  No improvement with over the counter medications or other home remedies.Denies other complaint or health concern today.   Patient Active Problem List   Diagnosis Date Noted  . Hypothyroid 07/29/2011    Past Medical History  Diagnosis Date  . Thyroid disease   . Cancer     Past Surgical History  Procedure Laterality Date  . Appendectomy    . Brain surgery      History  Substance Use Topics  . Smoking status: Former Smoker    Quit date: 06/16/1983  . Smokeless tobacco: Not on file  . Alcohol Use: Not on file    Family History  Problem Relation Age of Onset  . Dementia Mother   . Pulmonary fibrosis Father   . Diabetes Brother   . Thyroid disease Brother   . Cancer Maternal Grandmother   . Stroke Maternal Grandfather   . Diabetes Paternal Grandfather     Allergies  Allergen Reactions  . Levofloxacin Other (See Comments)    Affected tendons  . Tetracyclines & Related Swelling    tongue swelling and peeling    Medication list has been reviewed and updated.  Current Outpatient Prescriptions on File Prior to Visit  Medication Sig Dispense Refill  . Acetaminophen (TYLENOL PO) Take by mouth every 4 (four) hours as needed.      . Calcium-Vitamin D (CALTRATE 600 PLUS-VIT D PO) Take by mouth.      . diphenhydrAMINE (BENADRYL ALLERGY) 25 MG tablet Take 25 mg  by mouth every 4 (four) hours.      Marland Kitchen HYDROcodone-homatropine (HYCODAN) 5-1.5 MG/5ML syrup Take 5 mLs by mouth at bedtime as needed for cough.  120 mL  0  . ipratropium (ATROVENT) 0.03 % nasal spray Place 2 sprays into the nose every 12 (twelve) hours.  30 mL  0  . levothyroxine (SYNTHROID, LEVOTHROID) 150 MCG tablet Take 150 mcg by mouth daily.       No current facility-administered medications on file prior to visit.    Review of Systems:  As per HPI, otherwise negative.    Physical Examination: Filed Vitals:   04/28/13 1334  BP: 124/82  Pulse: 96  Temp: 98 F (36.7 C)  Resp: 16   Filed Vitals:   04/28/13 1334  Height: 5' 3.5" (1.613 m)  Weight: 240 lb (108.863 kg)   Body mass index is 41.84 kg/(m^2). Ideal Body Weight: Weight in (lb) to have BMI = 25: 143.1  GEN: WDWN, NAD, Non-toxic, A & O x 3 HEENT: Atraumatic, Normocephalic. Neck supple. No masses, No LAD. Ears and Nose: No external deformity. CV: RRR, No M/G/R. No JVD. No thrill. No extra heart sounds. PULM: CTA B, diffuse wheezes, no crackles, rhonchi. No retractions. No resp. distress. No accessory muscle use. ABD: S,  NT, ND, +BS. No rebound. No HSM. EXTR: No c/c/e NEURO Normal gait.  PSYCH: Normally interactive. Conversant. Not depressed or anxious appearing.  Calm demeanor.    Assessment and Plan: Sinusitis  Bronchitis with bronchospasm proventil augmentin mucinex d  Signed,  Phillips Odor, MD

## 2013-04-30 ENCOUNTER — Encounter: Payer: Self-pay | Admitting: Emergency Medicine

## 2013-07-18 ENCOUNTER — Ambulatory Visit (INDEPENDENT_AMBULATORY_CARE_PROVIDER_SITE_OTHER): Payer: No Typology Code available for payment source | Admitting: Emergency Medicine

## 2013-07-18 VITALS — BP 126/72 | HR 96 | Temp 98.6°F | Resp 16 | Ht 63.5 in | Wt 236.8 lb

## 2013-07-18 DIAGNOSIS — J018 Other acute sinusitis: Secondary | ICD-10-CM

## 2013-07-18 DIAGNOSIS — J209 Acute bronchitis, unspecified: Secondary | ICD-10-CM

## 2013-07-18 MED ORDER — HYDROCOD POLST-CHLORPHEN POLST 10-8 MG/5ML PO LQCR: 5.0000 mL | Freq: Two times a day (BID) | ORAL | Status: DC | PRN

## 2013-07-18 MED ORDER — PSEUDOEPHEDRINE-GUAIFENESIN ER 60-600 MG PO TB12: 1.0000 | ORAL_TABLET | Freq: Two times a day (BID) | ORAL | Status: DC

## 2013-07-18 MED ORDER — AMOXICILLIN-POT CLAVULANATE 875-125 MG PO TABS: 1.0000 | ORAL_TABLET | Freq: Two times a day (BID) | ORAL | Status: DC

## 2013-07-18 NOTE — Progress Notes (Signed)
[facility] [address] [place] [address] [phone]  Date:  07/18/2013   Name:  Elizabeth Ramirez   DOB:  09-Nov-1945   MRN:  [id number]  PCP:  Cari Caraway, MD    Chief Complaint: sinus pain, Cough, sinus drainnage and Chills   History of Present Illness:  Elizabeth Ramirez is a 68 y.o. very pleasant female patient who presents with the following:  Ill with nasal congestion and drainage. No fever or chills. Has a purulent discharge and post nasal drainage.  Scant non productive cough.  No wheezing or shortness of breath.  Had a fever and chills that have resolved.  No rash.  No improvement with over the counter medications or other home remedies. Denies other complaint or health concern today.   Patient Active Problem List   Diagnosis Date Noted  . Hypothyroid 07/29/2011    Past Medical History  Diagnosis Date  . Thyroid disease   . Cancer     Past Surgical History  Procedure Laterality Date  . Appendectomy    . Brain surgery      History  Substance Use Topics  . Smoking status: Former Smoker    Quit date: 06/16/1983  . Smokeless tobacco: Not on file  . Alcohol Use: Not on file    Family History  Problem Relation Age of Onset  . Dementia Mother   . Pulmonary fibrosis Father   . Diabetes Brother   . Thyroid disease Brother   . Cancer Maternal Grandmother   . Stroke Maternal Grandfather   . Diabetes Paternal Grandfather     Allergies  Allergen Reactions  . Levofloxacin Other (See Comments)    Affected tendons  . Tetracyclines & Related Swelling    tongue swelling and peeling    Medication list has been reviewed and updated.  Current Outpatient Prescriptions on File Prior to Visit  Medication Sig Dispense Refill  . Acetaminophen (TYLENOL PO) Take by mouth every 4 (four) hours as needed.      . Calcium-Vitamin D (CALTRATE 600 PLUS-VIT D PO) Take by mouth.      . diphenhydrAMINE (BENADRYL ALLERGY) 25 MG tablet Take 25 mg by  mouth every 4 (four) hours.      Marland Kitchen ipratropium (ATROVENT) 0.03 % nasal spray Place 2 sprays into the nose every 12 (twelve) hours.  30 mL  0  . levothyroxine (SYNTHROID, LEVOTHROID) 150 MCG tablet Take 150 mcg by mouth daily.      Marland Kitchen loratadine-pseudoephedrine (CLARITIN-D 12-HOUR) 5-120 MG per tablet Take 1 tablet by mouth 2 (two) times daily.      Marland Kitchen albuterol (PROVENTIL HFA;VENTOLIN HFA) 108 (90 BASE) MCG/ACT inhaler Inhale 2 puffs into the lungs every 4 (four) hours as needed for wheezing or shortness of breath (cough, shortness of breath or wheezing.).  1 Inhaler  1  . amoxicillin-clavulanate (AUGMENTIN XR) 1000-62.5 MG per tablet Take 2 tablets by mouth 2 (two) times daily.  40 tablet  0  . chlorpheniramine-HYDROcodone (TUSSIONEX PENNKINETIC ER) 10-8 MG/5ML LQCR Take 5 mLs by mouth every 12 (twelve) hours as needed.  60 mL  0  . Dextromethorphan-Guaifenesin (MUCINEX DM) 30-600 MG TB12 Take by mouth.      Marland Kitchen HYDROcodone-homatropine (HYCODAN) 5-1.5 MG/5ML syrup Take 5 mLs by mouth at bedtime as needed for cough.  120 mL  0  . pseudoephedrine-guaifenesin (MUCINEX D) 60-600 MG per tablet Take 1 tablet by mouth every 12 (twelve) hours.  18 tablet  0  No current facility-administered medications on file prior to visit.    Review of Systems:  As per HPI, otherwise negative.    Physical Examination: Filed Vitals:   07/18/13 1418  BP: 126/72  Pulse: 96  Temp: 98.6 F (37 C)  Resp: 16   Filed Vitals:   07/18/13 1418  Height: 5' 3.5" (1.613 m)  Weight: 236 lb 12.8 oz (107.412 kg)   Body mass index is 41.28 kg/(m^2). Ideal Body Weight: Weight in (lb) to have BMI = 25: 143.1  GEN: WDWN, NAD, Non-toxic, A & O x 3 HEENT: Atraumatic, Normocephalic. Neck supple. No masses, No LAD. Ears and Nose: No external deformity. CV: RRR, No M/G/R. No JVD. No thrill. No extra heart sounds. PULM: CTA B, no wheezes, crackles, rhonchi. No retractions. No resp. distress. No accessory muscle use. ABD: S,  NT, ND, +BS. No rebound. No HSM. EXTR: No c/c/e NEURO Normal gait.  PSYCH: Normally interactive. Conversant. Not depressed or anxious appearing.  Calm demeanor.    Assessment and Plan: Sinusitis Bronchitis augmentin mucinex tussionex   Signed,  Ellison Carwin, MD

## 2013-07-18 NOTE — Patient Instructions (Signed)

## 2013-10-06 ENCOUNTER — Encounter: Payer: Self-pay | Admitting: Internal Medicine

## 2013-10-19 ENCOUNTER — Ambulatory Visit (INDEPENDENT_AMBULATORY_CARE_PROVIDER_SITE_OTHER): Payer: No Typology Code available for payment source | Admitting: Family Medicine

## 2013-10-19 VITALS — BP 120/92 | HR 93 | Temp 98.2°F | Resp 20 | Ht 63.0 in | Wt 241.0 lb

## 2013-10-19 DIAGNOSIS — H6690 Otitis media, unspecified, unspecified ear: Secondary | ICD-10-CM

## 2013-10-19 DIAGNOSIS — J329 Chronic sinusitis, unspecified: Secondary | ICD-10-CM

## 2013-10-19 HISTORY — DX: Otitis media, unspecified, unspecified ear: H66.90

## 2013-10-19 MED ORDER — AZITHROMYCIN 250 MG PO TABS: ORAL_TABLET | ORAL | Status: DC

## 2013-10-19 MED ORDER — NEOMYCIN-POLYMYXIN-HC 3.5-10000-1 OT SOLN: 3.0000 [drp] | Freq: Three times a day (TID) | OTIC | Status: DC

## 2013-10-19 NOTE — Progress Notes (Signed)
1.  SUBJECTIVE: URI symptoms:  Elizabeth Ramirez is a 68 y.o. female who complains of URI symptoms present for past 5 days.  Describes ear pain in both ears since that time.  Has tried mucinex without relief.  Sick contacts are none.  No fevers or chills. No nausea or vomiting.  Denies smoking cigarettes.  OBJECTIVE: BP 120/92  Pulse 93  Temp(Src) 98.2 F (36.8 C) (Oral)  Resp 20  Ht 5\' 3"  (1.6 m)  Wt 241 lb (109.317 kg)  BMI 42.70 kg/m2  SpO2 95% Gen:  Patient sitting on exam table, appears stated age in no acute distress Head: Normocephalic atraumatic Eyes: EOMI, PERRL, sclera and conjunctiva non-erythematous Ears:  Canals clear on Left.  Does have some very mild abrasion and erythema on Right from scratching yesterday.  No wax present.  TM's erythematous and edematous Right and Left side.  Nose:  Nasal turbinates grossly enlarged bilaterally.  Mouth: Mucosa membranes moist. Tonsils +2, nonenlarged, non-erythematous.  Throat and pharynx normal appearing. Neck: No cervical lymphadenopathy noted Heart:  RRR, no murmurs auscultated. Pulm:  Clear to auscultation bilaterally with good air movement.  No wheezes or rales noted.    Imp/Plan: 1.  Ear infection: - Bilateral - Treat with cortisporin plus z-pak.  States she doesn't think Augmentin is helping her.  -Patient requesting referral to ENT for increasing number of sinus and ear infections per year.  Has seen [facility] in past.  Will place referral today.

## 2013-10-19 NOTE — Patient Instructions (Signed)
I'm sending in the [facility] for you.  We will refer you to ENT.  Use hydrogen peroxide on your Right ear.  Try the drops one at a time there and stop if it hurts. You can use them regularly in your Left ear.    Keep taking the Claritin.    Keep using the Ipratropium bromide.

## 2013-10-24 ENCOUNTER — Encounter: Payer: Self-pay | Admitting: Internal Medicine

## 2013-11-17 ENCOUNTER — Other Ambulatory Visit: Payer: Self-pay

## 2013-11-17 DIAGNOSIS — Z1231 Encounter for screening mammogram for malignant neoplasm of breast: Secondary | ICD-10-CM

## 2013-12-07 ENCOUNTER — Ambulatory Visit
Admission: RE | Admit: 2013-12-07 | Discharge: 2013-12-07 | Disposition: A | Discharge status: Home / Self Care | Payer: No Typology Code available for payment source | Source: Ambulatory Visit

## 2013-12-07 DIAGNOSIS — Z1231 Encounter for screening mammogram for malignant neoplasm of breast: Secondary | ICD-10-CM

## 2014-01-15 ENCOUNTER — Other Ambulatory Visit: Payer: Self-pay | Admitting: Gastroenterology

## 2014-04-10 ENCOUNTER — Ambulatory Visit (INDEPENDENT_AMBULATORY_CARE_PROVIDER_SITE_OTHER): Payer: No Typology Code available for payment source | Admitting: Family Medicine

## 2014-04-10 VITALS — BP 123/84 | HR 78 | Temp 98.4°F | Resp 17 | Ht 64.5 in | Wt 248.0 lb

## 2014-04-10 DIAGNOSIS — J0100 Acute maxillary sinusitis, unspecified: Secondary | ICD-10-CM

## 2014-04-10 DIAGNOSIS — R059 Cough, unspecified: Secondary | ICD-10-CM

## 2014-04-10 DIAGNOSIS — R05 Cough: Secondary | ICD-10-CM

## 2014-04-10 MED ORDER — HYDROCOD POLST-CHLORPHEN POLST 10-8 MG/5ML PO LQCR: 5.0000 mL | Freq: Two times a day (BID) | ORAL | Status: DC | PRN

## 2014-04-10 MED ORDER — AMOXICILLIN-POT CLAVULANATE 875-125 MG PO TABS: 1.0000 | ORAL_TABLET | Freq: Two times a day (BID) | ORAL | Status: DC

## 2014-04-10 NOTE — Progress Notes (Signed)
 Chief Complaint:  Chief Complaint  Patient presents with  . URI  . Sinusitis    HPI: Elizabeth Ramirez is a 68 y.o. female who is here for  Nasal congestion and chest congestion and deep cough and has yellow green productive cough 6 days, has tried claritin D in the daytime,  And has been using nasal saline and atrovent NS.  She has had chills, no fevers, No CP SOB or wheezing  Past Medical History  Diagnosis Date  . Thyroid disease   . Cancer    Past Surgical History  Procedure Laterality Date  . Appendectomy    . Brain surgery     History   Social History  . Marital Status: Single    Spouse Name: N/A    Number of Children: N/A  . Years of Education: N/A   Social History Main Topics  . Smoking status: Former Smoker    Quit date: 06/16/1983  . Smokeless tobacco: None  . Alcohol Use: None  . Drug Use: None  . Sexual Activity: None   Other Topics Concern  . None   Social History Narrative  . None   Family History  Problem Relation Age of Onset  . Dementia Mother   . Pulmonary fibrosis Father   . Diabetes Brother   . Thyroid disease Brother   . Cancer Maternal Grandmother   . Stroke Maternal Grandfather   . Diabetes Paternal Grandfather    Allergies  Allergen Reactions  . vofloxacin Other (See Comments)    Affected tendons  . Tetracyclines & Related Swelling    tongue swelling and peeling   Prior to Admission medications   Medication Sig Start Date End Date Taking? Authorizing Provider  diphenhydrAMINE (BENADRYL ALLERGY) 25 MG tablet Take 25 mg by mouth every 4 (four) hours.   Yes Historical Provider, MD  ipratropium (ATROVENT) 0.03 % nasal spray Place 2 sprays into the nose every 12 (twelve) hours. 02/09/13  Yes  P , DO  levothyroxine (SYNTHROID, LEVOTHROID) 137 MCG tablet Take 137 mcg by mouth daily before breakfast.   Yes Historical Provider, MD  loratadine-pseudoephedrine (CLARITIN-D 12-HOUR) 5-120 MG per tablet Take 1 tablet by mouth 2  (two) times daily.   Yes Historical Provider, MD     ROS: The patient denies fevers,  night sweats, unintentional weight loss, chest pain, palpitations, wheezing, dyspnea on exertion, nausea, vomiting, abdominal pain, dysuria, hematuria, melena, numbness, weakness, or tingling.   All other systems have been reviewed and were otherwise negative with the exception of those mentioned in the HPI and as above.    PHYSICAL EXAM: Filed Vitals:   04/10/14 1843  BP: 123/84  Pulse: 78  Temp: 98.4 F (36.9 C)  Resp: 17   Filed Vitals:   04/10/14 1843  Height: 5' 4.5" (1.638 m)  Weight: 248 lb (112.492 kg)   Body mass index is 41.93 kg/(m^2).  General: Alert, no acute distress HEENT:  Normocephalic, atraumatic, oropharynx patent. EOMI, PERRLA, + sinus tenderness ( fronatl and maxillary), TM nl, no exudates, + PND Cardiovascular:  Regular rate and rhythm, no rubs murmurs or gallops.  No Carotid bruits, radial pulse intact. No pedal edema.  Respiratory: Clear to auscultation bilaterally.  No wheezes, rales, or rhonchi.  No cyanosis, no use of accessory musculature GI: No organomegaly, abdomen is soft and non-tender, positive bowel sounds.  No masses. Skin: No rashes. Neurologic: Facial musculature symmetric. Psychiatric: Patient is appropriate throughout our interaction. Lymphatic: No cervical lymphadenopathy  Musculoskeletal: Gait intact.   LABS: Results for orders placed in visit on 10/06/12  URINE CULTURE      Result Value Ref Range   Culture ESCHERICHIA COLI     Colony Count 30,000 COLONIES/ML     Organism ID, Bacteria ESCHERICHIA COLI    POCT UA - MICROSCOPIC ONLY      Result Value Ref Range   WBC, Ur, HPF, POC 1-5     RBC, urine, microscopic neg     Bacteria, U Microscopic neg     Mucus, UA neg     Epithelial cells, urine per micros neg     Crystals, Ur, HPF, POC neg     Casts, Ur, LPF, POC neg     Yeast, UA neg    POCT URINALYSIS DIPSTICK      Result Value Ref Range    Color, UA orange     Clarity, UA clear     Glucose, UA neg     Bilirubin, UA neg     Ketones, UA neg     Spec Grav, UA 1.010     Blood, UA neg     pH, UA 6.0     Protein, UA neg     Urobilinogen, UA 0.2     Nitrite, UA positive     Leukocytes, UA small (1+)       EKG/XRAY:   Primary read interpreted by Dr. Marin Comment at [facility].   ASSESSMENT/PLAN: Encounter Diagnoses  Name Primary?  . Acute maxillary sinusitis, recurrence not specified Yes  . Cough    Rx Tussionex Rx Augmenitn C/w atrovent NS and also saline, F/u prn, no chest xray at this time, she will get one if worsenign sxs   Gross sideeffects, risk and benefits, and alternatives of medications d/w patient. Patient is aware that all medications have potential sideeffects and we are unable to predict every sideeffect or drug-drug interaction that may occur.  , [place], DO 04/11/2014 2:07 PM

## 2014-04-10 NOTE — Patient Instructions (Signed)

## 2014-06-15 HISTORY — PX: BREAST LUMPECTOMY: SHX2

## 2014-11-28 ENCOUNTER — Other Ambulatory Visit: Payer: Self-pay

## 2014-11-28 ENCOUNTER — Ambulatory Visit (INDEPENDENT_AMBULATORY_CARE_PROVIDER_SITE_OTHER): Payer: No Typology Code available for payment source | Admitting: Emergency Medicine

## 2014-11-28 VITALS — BP 104/70 | HR 79 | Temp 97.8°F | Resp 18 | Ht 64.0 in | Wt 252.0 lb

## 2014-11-28 DIAGNOSIS — J209 Acute bronchitis, unspecified: Secondary | ICD-10-CM | POA: Diagnosis not present

## 2014-11-28 DIAGNOSIS — Z1231 Encounter for screening mammogram for malignant neoplasm of breast: Secondary | ICD-10-CM

## 2014-11-28 DIAGNOSIS — J0140 Acute pansinusitis, unspecified: Secondary | ICD-10-CM | POA: Diagnosis not present

## 2014-11-28 MED ORDER — AMOXICILLIN-POT CLAVULANATE 875-125 MG PO TABS: 1.0000 | ORAL_TABLET | Freq: Two times a day (BID) | ORAL | Status: DC

## 2014-11-28 MED ORDER — HYDROCOD POLST-CPM POLST ER 10-8 MG/5ML PO SUER: 5.0000 mL | Freq: Two times a day (BID) | ORAL | Status: DC

## 2014-11-28 NOTE — Progress Notes (Signed)
Subjective:  Patient ID: Elizabeth Ramirez, female    DOB: July 12, 1945  Age: 69 y.o. MRN: [id number]  CC: Nasal Congestion and Cough   HPI Elizabeth Ramirez presents  With an acute upper respiratory infection. She has nasal congestion and pressure cheeks and forehead. She has post nasal drainage noted. On color. She has a cough productive of purulent sputum. She has no wheezing or shortness of breath. No fever or chills. He has no nausea vomiting. No stool change. She's been ill for 1 week. She has no improvement with over-the-counter medication. Using Claritin D with no improvement.  History Elizabeth Ramirez has a past medical history of Thyroid disease and Cancer.   She has past surgical history that includes Appendectomy and Brain surgery.   Her  family history includes Cancer in her maternal grandmother; Dementia in her mother; Diabetes in her brother and paternal grandfather; Pulmonary fibrosis in her father; Stroke in her maternal grandfather; Thyroid disease in her brother.  She   reports that she quit smoking about 31 years ago. She does not have any smokeless tobacco history on file. Her alcohol and drug histories are not on file.  Outpatient Prescriptions Prior to Visit  Medication Sig Dispense Refill  . diphenhydrAMINE (BENADRYL ALLERGY) 25 MG tablet Take 25 mg by mouth every 4 (four) hours.    Marland Kitchen ipratropium (ATROVENT) 0.03 % nasal spray Place 2 sprays into the nose every 12 (twelve) hours. 30 mL 0  . levothyroxine (SYNTHROID, LEVOTHROID) 137 MCG tablet Take 137 mcg by mouth daily before breakfast.    . loratadine-pseudoephedrine (CLARITIN-D 12-HOUR) 5-120 MG per tablet Take 1 tablet by mouth 2 (two) times daily.    Marland Kitchen amoxicillin-clavulanate (AUGMENTIN) 875-125 MG per tablet Take 1 tablet by mouth 2 (two) times daily. 20 tablet 0  . chlorpheniramine-HYDROcodone (TUSSIONEX PENNKINETIC ER) 10-8 MG/5ML LQCR Take 5 mLs by mouth every 12 (twelve) hours as needed for cough. 120 mL 0   No  facility-administered medications prior to visit.    History   Social History  . Marital Status: Single    Spouse Name: N/A  . Number of Children: N/A  . Years of Education: N/A   Social History Main Topics  . Smoking status: Former Smoker    Quit date: 06/16/1983  . Smokeless tobacco: Not on file  . Alcohol Use: Not on file  . Drug Use: Not on file  . Sexual Activity: Not on file   Other Topics Concern  . None   Social History Narrative     Review of Systems  Constitutional: Negative for fever, chills and appetite change.  HENT: Positive for congestion, postnasal drip, rhinorrhea, sinus pressure and sore throat. Negative for ear pain.   Eyes: Negative for pain and redness.  Respiratory: Positive for cough. Negative for shortness of breath and wheezing.   Cardiovascular: Negative for leg swelling.  Gastrointestinal: Negative for nausea, vomiting, abdominal pain, diarrhea, constipation and blood in stool.  Endocrine: Negative for polyuria.  Genitourinary: Negative for dysuria, urgency, frequency and flank pain.  Musculoskeletal: Negative for gait problem.  Skin: Negative for rash.  Neurological: Negative for weakness and headaches.  Psychiatric/Behavioral: Negative for confusion and decreased concentration. The patient is not nervous/anxious.     Objective:  BP 104/70 mmHg  Pulse 79  Temp(Src) 97.8 F (36.6 C)  Resp 18  Ht 5\' 4"  (1.626 m)  Wt 252 lb (114.306 kg)  BMI 43.23 kg/m2  SpO2 98%  Physical Exam  Constitutional: She is oriented to  person, place, and time. She appears well-developed and well-nourished. No distress.  HENT:  Head: Normocephalic and atraumatic.  Right Ear: External ear normal.  Left Ear: External ear normal.  Nose: Nose normal.  Eyes: Conjunctivae and EOM are normal. Pupils are equal, round, and reactive to light. No scleral icterus.  Neck: Normal range of motion. Neck supple. No tracheal deviation present.  Cardiovascular: Normal rate,  regular rhythm and normal heart sounds.   Pulmonary/Chest: Effort normal. No respiratory distress. She has no wheezes. She has no rales.  Abdominal: She exhibits no mass. There is no tenderness. There is no rebound and no guarding.  Musculoskeletal: She exhibits no edema.  Lymphadenopathy:    She has no cervical adenopathy.  Neurological: She is alert and oriented to person, place, and time. Coordination normal.  Skin: Skin is warm and dry. No rash noted.  Psychiatric: She has a normal mood and affect. Her behavior is normal.      Assessment & Plan:   Elizabeth Ramirez was seen today for nasal congestion and cough.  Diagnoses and all orders for this visit:  Acute bronchitis, unspecified organism  Acute pansinusitis, recurrence not specified  Other orders -     amoxicillin-clavulanate (AUGMENTIN) 875-125 MG per tablet; Take 1 tablet by mouth 2 (two) times daily. -     chlorpheniramine-HYDROcodone (TUSSIONEX PENNKINETIC ER) 10-8 MG/5ML SUER; Take 5 mLs by mouth 2 (two) times daily.   I have discontinued Elizabeth Ramirez's amoxicillin-clavulanate and chlorpheniramine-HYDROcodone. I am also having her start on amoxicillin-clavulanate and chlorpheniramine-HYDROcodone. Additionally, I am having her maintain her diphenhydrAMINE, ipratropium, loratadine-pseudoephedrine, and levothyroxine.  Meds ordered this encounter  Medications  . amoxicillin-clavulanate (AUGMENTIN) 875-125 MG per tablet    Sig: Take 1 tablet by mouth 2 (two) times daily.    Dispense:  20 tablet    Refill:  0  . chlorpheniramine-HYDROcodone (TUSSIONEX PENNKINETIC ER) 10-8 MG/5ML SUER    Sig: Take 5 mLs by mouth 2 (two) times daily.    Dispense:  60 mL    Refill:  0    Appropriate red flag conditions were discussed with the patient as well as actions that should be taken.  Patient expressed his understanding.  Follow-up: Return if symptoms worsen or fail to improve.  Roselee Culver, MD

## 2014-11-28 NOTE — Patient Instructions (Signed)

## 2014-12-11 ENCOUNTER — Ambulatory Visit
Admission: RE | Admit: 2014-12-11 | Discharge: 2014-12-11 | Disposition: A | Discharge status: Home / Self Care | Payer: No Typology Code available for payment source | Source: Ambulatory Visit

## 2014-12-11 DIAGNOSIS — Z1231 Encounter for screening mammogram for malignant neoplasm of breast: Secondary | ICD-10-CM

## 2014-12-13 ENCOUNTER — Other Ambulatory Visit: Payer: Self-pay | Admitting: Family Medicine

## 2014-12-13 DIAGNOSIS — R928 Other abnormal and inconclusive findings on diagnostic imaging of breast: Secondary | ICD-10-CM

## 2014-12-14 ENCOUNTER — Other Ambulatory Visit: Payer: Self-pay | Admitting: Family Medicine

## 2014-12-14 ENCOUNTER — Ambulatory Visit
Admission: RE | Admit: 2014-12-14 | Discharge: 2014-12-14 | Disposition: A | Discharge status: Home / Self Care | Payer: No Typology Code available for payment source | Source: Ambulatory Visit | Attending: Family Medicine | Admitting: Family Medicine

## 2014-12-14 DIAGNOSIS — R921 Mammographic calcification found on diagnostic imaging of breast: Secondary | ICD-10-CM

## 2014-12-14 DIAGNOSIS — R928 Other abnormal and inconclusive findings on diagnostic imaging of breast: Secondary | ICD-10-CM

## 2014-12-19 ENCOUNTER — Ambulatory Visit (INDEPENDENT_AMBULATORY_CARE_PROVIDER_SITE_OTHER): Payer: No Typology Code available for payment source | Admitting: Family Medicine

## 2014-12-19 VITALS — BP 150/86 | HR 92 | Temp 97.9°F | Resp 18 | Ht 64.0 in | Wt 253.0 lb

## 2014-12-19 DIAGNOSIS — R300 Dysuria: Secondary | ICD-10-CM | POA: Diagnosis not present

## 2014-12-19 DIAGNOSIS — N3090 Cystitis, unspecified without hematuria: Secondary | ICD-10-CM

## 2014-12-19 LAB — POCT URINALYSIS DIPSTICK
BILIRUBIN UA: NEGATIVE
Glucose, UA: NEGATIVE
KETONES UA: NEGATIVE
Nitrite, UA: NEGATIVE
Protein, UA: 30
Spec Grav, UA: 1.025
Urobilinogen, UA: 0.2
pH, UA: 6

## 2014-12-19 LAB — POCT UA - MICROSCOPIC ONLY
CASTS, UR, LPF, POC: NEGATIVE
Crystals, Ur, HPF, POC: NEGATIVE
Mucus, UA: NEGATIVE
Renal tubular cells: POSITIVE
Yeast, UA: NEGATIVE

## 2014-12-19 MED ORDER — SULFAMETHOXAZOLE-TRIMETHOPRIM 800-160 MG PO TABS: 1.0000 | ORAL_TABLET | Freq: Two times a day (BID) | ORAL | Status: DC

## 2014-12-19 MED ORDER — PHENAZOPYRIDINE HCL 200 MG PO TABS: 200.0000 mg | ORAL_TABLET | Freq: Three times a day (TID) | ORAL | Status: DC | PRN

## 2014-12-19 NOTE — Progress Notes (Signed)
  Subjective:  Patient ID: Elizabeth Ramirez, female    DOB: 01-06-1946  Age: 69 y.o. MRN: [id number]  69 year old lady with history of dysuria. This started yesterday. Not sexually involved. It is been a long time since she has had a UTI.     Objective:   Abdomen soft and nontender. No CVA tenderness. Results for orders placed or performed in visit on 12/19/14  POCT urinalysis dipstick  Result Value Ref Range   Color, UA yellow    Clarity, UA clear    Glucose, UA neg    Bilirubin, UA neg    Ketones, UA neg    Spec Grav, UA 1.025    Blood, UA trace-intact    pH, UA 6.0    Protein, UA 30    Urobilinogen, UA 0.2    Nitrite, UA neg    Leukocytes, UA large (3+) (A) Negative    Assessment & Plan:   Assessment: Cystitis Dysuria  Plan: Patient Instructions  Drink plenty of fluids  Take sulfamethoxazole one pill twice daily at breakfast and supper  Take the Pyridium 1 pill 3 times daily for a day or 2 as needed for the urinary pain and frequency. Take caution because the brown urine does stain white clothing.  In the event of getting acutely worse return or go to the emergency room if necessary  We will let you know if there are any concerns from the urine culture     Karina Lenderman, MD 12/19/2014

## 2014-12-19 NOTE — Patient Instructions (Signed)
Drink plenty of fluids  Take sulfamethoxazole one pill twice daily at breakfast and supper  Take the Pyridium 1 pill 3 times daily for a day or 2 as needed for the urinary pain and frequency. Take caution because the brown urine does stain white clothing.  In the event of getting acutely worse return or go to the emergency room if necessary  We will let you know if there are any concerns from the urine culture

## 2014-12-21 ENCOUNTER — Ambulatory Visit
Admission: RE | Admit: 2014-12-21 | Discharge: 2014-12-21 | Disposition: A | Discharge status: Home / Self Care | Payer: No Typology Code available for payment source | Source: Ambulatory Visit | Attending: Family Medicine | Admitting: Family Medicine

## 2014-12-21 ENCOUNTER — Other Ambulatory Visit: Payer: Self-pay | Admitting: Family Medicine

## 2014-12-21 DIAGNOSIS — R921 Mammographic calcification found on diagnostic imaging of breast: Secondary | ICD-10-CM

## 2014-12-22 LAB — URINE CULTURE

## 2014-12-24 ENCOUNTER — Ambulatory Visit
Admission: RE | Admit: 2014-12-24 | Discharge: 2014-12-24 | Disposition: A | Discharge status: Home / Self Care | Payer: No Typology Code available for payment source | Source: Ambulatory Visit | Attending: Family Medicine | Admitting: Family Medicine

## 2014-12-24 ENCOUNTER — Other Ambulatory Visit: Payer: Self-pay | Admitting: Family Medicine

## 2014-12-24 DIAGNOSIS — R921 Mammographic calcification found on diagnostic imaging of breast: Secondary | ICD-10-CM

## 2014-12-24 MED ORDER — NITROFURANTOIN MONOHYD MACRO 100 MG PO CAPS: 100.0000 mg | ORAL_CAPSULE | Freq: Two times a day (BID) | ORAL | Status: DC

## 2014-12-28 ENCOUNTER — Other Ambulatory Visit: Payer: Self-pay

## 2014-12-28 DIAGNOSIS — D0511 Intraductal carcinoma in situ of right breast: Secondary | ICD-10-CM

## 2014-12-28 NOTE — Addendum Note (Signed)
Addended by: Excell Seltzer T on: 12/28/2014 06:09 PM   Modules accepted: Orders

## 2015-01-01 ENCOUNTER — Encounter: Payer: Self-pay | Admitting: Genetic Counselor

## 2015-01-01 ENCOUNTER — Telehealth: Payer: Self-pay | Admitting: *Deleted

## 2015-01-01 ENCOUNTER — Ambulatory Visit (HOSPITAL_BASED_OUTPATIENT_CLINIC_OR_DEPARTMENT_OTHER): Payer: No Typology Code available for payment source | Admitting: Genetic Counselor

## 2015-01-01 ENCOUNTER — Other Ambulatory Visit: Payer: Self-pay | Admitting: General Surgery

## 2015-01-01 ENCOUNTER — Other Ambulatory Visit: Payer: No Typology Code available for payment source

## 2015-01-01 DIAGNOSIS — Z809 Family history of malignant neoplasm, unspecified: Secondary | ICD-10-CM

## 2015-01-01 DIAGNOSIS — Z853 Personal history of malignant neoplasm of breast: Secondary | ICD-10-CM

## 2015-01-01 DIAGNOSIS — Z801 Family history of malignant neoplasm of trachea, bronchus and lung: Secondary | ICD-10-CM

## 2015-01-01 DIAGNOSIS — D0511 Intraductal carcinoma in situ of right breast: Secondary | ICD-10-CM | POA: Diagnosis not present

## 2015-01-01 DIAGNOSIS — C50911 Malignant neoplasm of unspecified site of right female breast: Secondary | ICD-10-CM

## 2015-01-01 DIAGNOSIS — C50919 Malignant neoplasm of unspecified site of unspecified female breast: Secondary | ICD-10-CM

## 2015-01-01 DIAGNOSIS — Z8041 Family history of malignant neoplasm of ovary: Secondary | ICD-10-CM

## 2015-01-01 DIAGNOSIS — Z803 Family history of malignant neoplasm of breast: Secondary | ICD-10-CM | POA: Diagnosis not present

## 2015-01-01 DIAGNOSIS — Z800 Family history of malignant neoplasm of digestive organs: Secondary | ICD-10-CM

## 2015-01-01 DIAGNOSIS — Z315 Encounter for genetic counseling: Secondary | ICD-10-CM | POA: Diagnosis not present

## 2015-01-01 DIAGNOSIS — D493 Neoplasm of unspecified behavior of breast: Secondary | ICD-10-CM

## 2015-01-01 DIAGNOSIS — Z806 Family history of leukemia: Secondary | ICD-10-CM

## 2015-01-01 NOTE — Telephone Encounter (Signed)
Received referral from [facility].  Called pt and confirmed 01/01/15 genetic appt and 01/14/15 med onc appt.  Mailed before appt letter, calendar, welcoming packet & intake form to pt.  Emailed Robin at [facility] to make her aware.  Placed a copy of records in Dr. Virgie Dad box and took one to HIM to scan.

## 2015-01-01 NOTE — Progress Notes (Signed)
REFERRING PROVIDER: Lurline Del, MD  PRIMARY PROVIDER:  Cari Caraway, MD  OTHER PROVIDER(S): Excell Seltzer, MD  PRIMARY REASON FOR VISIT:  1. Breast cancer, right   2. History of breast cancer in female   3. Family history of ovarian cancer   4. Family history of stomach cancer   5. Family history of chronic lymphoid leukemia   6. Family history of colon cancer   7. Family history of cancer      HISTORY OF PRESENT ILLNESS:   [name], a 69 y.o. female, was seen for a [facility] cancer genetics consultation at the request of Dr. [name] due to a personal history of two breast cancer primaries and a family history of ovarian, and other cancers.  [name] presents to clinic today with a friend to discuss the possibility of a hereditary predisposition to cancer, genetic testing, and to further clarify her future cancer risks, as well as potential cancer risks for family members.   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  She will see Drs. Magrinat and Pablo Ledger to discuss treatment on 01/14/15.  Surgery will be either mastectomy or bilateral mastectomy.  [name] has a history of a separate primary cancer of the right breast, diagnosed in 2005 at the age of 41.  She reports that this breast cancer was ER+ and that this was treated with right lumpectomy and anastrozole.  CANCER HISTORY:   No history exists.  2016 - DCIS of right breast (08M); ER/PR+ 2005 - Separate primary breast cancer (58y); ER+  HORMONAL RISK FACTORS:  Menarche was at age 12.  First live birth at age - no children.  OCP use for approximately 0 years.  Ovaries intact: yes.  Hysterectomy: no.  Menopausal status: postmenopausal.  HRT use: 0 years. Colonoscopy: yes; one sessile serrated adenoma found in 2015; return in 5 years. Mammogram within the last year: yes. Number of breast biopsies: 2. Up to  date with pelvic exams:  yes. Any excessive radiation exposure in the past:  Recalls that she swallowed some quarters when she was 69 years old and had to be x-rayed everyday for 2 weeks   Past Medical History  Diagnosis Date  . Thyroid disease   . Cancer   . Breast cancer 2016; 2005    Separate primaries; 2016 - DCIS of R breast; ER/PR+    Past Surgical History  Procedure Laterality Date  . Appendectomy    . Brain surgery      History   Social History  . Marital Status: Single    Spouse Name: N/A  . Number of Children: N/A  . Years of Education: N/A   Social History Main Topics  . Smoking status: Former Smoker -- 1.00 packs/day for 9 years    Types: Cigarettes    Quit date: 06/16/1983  . Smokeless tobacco: Never Used  . Alcohol Use: 4.2 oz/week    7 Glasses of wine per week     Comment: one glass w/ dinner  . Drug Use: Not on file  . Sexual Activity: Not on file   Other Topics Concern  . None   Social History Narrative     FAMILY HISTORY:  We obtained a detailed, 4-generation family history.  Significant diagnoses are listed below: Family History  Problem Relation Age of Onset  . Dementia Mother   . Other Mother 37    "  spot on lung"; secondhand smoke exposure  . Pulmonary fibrosis Father   . Diabetes Brother   . Thyroid disease Brother   . Leukemia Brother 70    chronic lymphocytic   . Stomach cancer Maternal Grandmother     dx. 28s  . Heart attack Maternal Grandfather   . Diabetes Paternal Grandfather   . Stroke Paternal Grandfather   . Parkinson's disease Maternal Aunt   . Diabetes Paternal Aunt   . Other Paternal Aunt     bowel obstruction  . Heart Problems Paternal Uncle   . Ovarian cancer Maternal Aunt     dx. 45s; paternal half sister of her mother  . Multiple myeloma Maternal Aunt   . Multiple myeloma Maternal Aunt 75  . Kidney failure Paternal Aunt   . Pulmonary fibrosis Paternal Aunt   . Stroke Paternal Aunt   . Heart Problems  Paternal Aunt   . Emphysema Paternal Uncle   . Heart Problems Paternal Uncle   . Lung cancer Paternal Uncle     smoker  . Colon cancer Cousin   . Breast cancer Cousin     dx. 55s    [name] has one brother who was diagnosed with CLL at the age of 28.  Her mother died at the age of 25 and had a history of a "spot on her lung" found at the age of 66.  Ms. Shelby Mattocks father died of pulmonary fibrosis at the age of 48.    There is a maternal family history of cancer.  Ms. Shelby Mattocks mother has two full siblings and eight paternal half siblings.  Her full brother and sister lived into their 42s, cancer-free.  One paternal half-sister (of four half-sisters) died of ovarian cancer in her 17s.  [name] has no information on this aunt's children.  Two more half-sisters were diagnosed with multiple myeloma in their 70s-80s.  One half-brother died of alcohol-related issues in his 34s.  The remaining half siblings are alive and cancer-free in their 29s.  Ms. Shelby Mattocks maternal grandfather died of a heart attack at 107.  Her maternal grandmother died of stomach cancer at the age of 67.  She has no information for the siblings or parents of her maternal grandmother.  To her knowledge, no additional maternal relatives have had cancer.  Ms. Shelby Mattocks father had two full brothers, three full sisters, two maternal half-sisters, and one maternal half-brother.  His full siblings all passed away between the ages of 17 and their 92s.  One of these full sisters passed away at 60 of diabetes and also had complications related to a bowel obstruction.  His maternal half-brother passed away of lung cancer at the age of 55, he was a smoker.  [name] has one first cousin, through one of the half-sisters who had colon cancer at an unknown age.  The daughter of this aunt (Ms. Shelby Mattocks first cousin once-removed) was diagnosed with breast cancer in her 52s.  Both of [name]'s paternal grandparents died in their  70s--her grandfather due to a stroke and her grandmother with colitis.  To her knowledge, no additional paternal relatives have had cancer.  Patient's maternal ancestors are of [place] and [place] descent, and paternal ancestors are of [place] and [place] descent. There is no reported Ashkenazi Jewish ancestry. There is no known consanguinity.  GENETIC COUNSELING ASSESSMENT: Samari Bittinger is a 69 y.o. female with a personal and family history of cancer which is somewhat suggestive of a hereditary cancer syndrome and  predisposition to cancer. We, therefore, discussed and recommended the following at today's visit.   DISCUSSION: We reviewed the characteristics, features and inheritance patterns of hereditary cancer syndromes. We also discussed genetic testing, including the appropriate family members to test, the process of testing, insurance coverage and turn-around-time for results. We discussed the implications of a negative, positive and/or variant of uncertain significant result. We recommended [name] pursue genetic testing for the 20-gene Breast/Ovarian Cancer panel through [facility] [name], MD).   Based on [name]'s personal and family history of cancer, she meets medical criteria for genetic testing. Despite that she meets criteria, she may still have an out of pocket cost. We discussed that if her out of pocket cost for testing is over $100, the laboratory will call and confirm whether she wants to proceed with testing.  If the out of pocket cost of testing is less than $100 she will be billed by the genetic testing laboratory.   PLAN: After considering the risks, benefits, and limitations, [name]  provided informed consent to pursue genetic testing and the blood sample was sent to [facility] for analysis of the 20-gene Breast/Ovarian Cancer Panel test. The Breast/Ovarian Cancer gene panel offered by [facility] includes sequencing and deletion/duplication analysis  for the following 19 genes:  ATM, BARD1, BRCA1, BRCA2, BRIP1, CDH1, CHEK2, FANCC, MLH1, MSH2, MSH6, NBN, PALB2, PMS2, PTEN, RAD51C, RAD51D, TP53, and XRCC2.  This panel also includes deletion/duplication analysis (without sequencing) for one gene, EPCAM.  Results should be available within approximately 2-3 weeks' time, at which point they will be disclosed by telephone to [name], as will any additional recommendations warranted by these results. [name] will receive a summary of her genetic counseling visit and a copy of her results once available. This information will also be available in Epic. We encouraged [name] to remain in contact with cancer genetics annually so that we can continuously update the family history and inform her of any changes in cancer genetics and testing that may be of benefit for her family. Ms. Shelby Mattocks questions were answered to her satisfaction today. Our contact information was provided should additional questions or concerns arise.  Thank you for the referral and allowing [place] to share in the care of your patient.   Jeanine Luz, MS Genetic Counselor [email or web address] Phone: [phone]  The patient was seen for a total of 60 minutes in face-to-face genetic counseling.  This patient was discussed with Drs. Magrinat, Lindi Adie and/or Burr Medico who agrees with the above.    _______________________________________________________________________ For Office Staff:  Number of people involved in session: 2 Was an Intern/ student involved with case: no

## 2015-01-09 ENCOUNTER — Encounter: Payer: Self-pay | Admitting: Internal Medicine

## 2015-01-13 NOTE — Progress Notes (Signed)
[facility]  [phone]) [phone] [phone]     ID: Elizabeth Ramirez DOB: July 28, 1945  MR#: [id number]  QZR#:[id number]  Patient Care Team: Cari Caraway, MD as PCP - General (Family Medicine) Chauncey Cruel, MD as Consulting Physician (Oncology) Excell Seltzer, MD as Consulting Physician (General Surgery) PCP: Cari Caraway, MD GYN:  Bobbye Charleston M.D. OTHER MD: Gaynelle Arabian MD,  Justice Britain M.D.  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 This was estrogen and progesterone receptor positive, HER-2 not amplified. Margins were ample. She participated in the MA-27 study and received anastrozole between June 2005 and June 2010, at which time she was released from follow-up.  More recently, Elizabeth Ramirez had screening bilateral mammography showing a suspicious area in the right breast and on 12/14/2014 underwent right diagnostic mammography at the breast Center. The breast density was category A. There was a group of pleomorphic calcifications in the upper central right breast spanning up to 2.8 cm. Biopsy of this area was obtained 12/21/2014 ([id number]) and showed ductal carcinoma in situ, grade 2 or 3, estrogen receptor 95% positive, and progesterone receptor 60% positive, both with strong staining intensity.  The patient's case was presented at the multidisciplinary breast cancer conference 01/02/2015. At that time a preliminary plan was proposed, namely genetics counseling and if no mutation noted breast MRI to be followed by lumpectomy, radiation, and anti-estrogens.  Her subsequent history is as detailed below.  INTERVAL HISTORY:  Elizabeth Ramirez  Was evaluated in the breast clinic  01/14/2015 accompanied by her significant other Elizabeth Ramirez. I had  not seen Elizabeth Ramirez for the last 5 years.  She is still working in Personal assistant, and she [facility] for [facility] or at a [employer]. She is still very concerned about radiation and she had many questions regarding anti-estrogens which we have addressed today  REVIEW OF SYSTEMS: There were no specific symptoms leading to the original mammogram, which was routinely scheduled. The patient denies unusual headaches, visual changes, nausea, vomiting, stiff neck, dizziness, or gait imbalance. There has been no cough, phlegm production, or pleurisy, no chest pain or pressure, and no change in bowel or bladder habits. The patient denies fever, rash, bleeding, unexplained fatigue or unexplained weight loss. She does have some arthritis symptoms Elizabeth Ramirez is planning to have right shoulder and right knee replacement surgery later this year. She wonders if the anastrozole she took for 5 years could have contributed to that. A detailed review of systems was otherwise entirely negative.  PAST MEDICAL HISTORY: Past Medical History  Diagnosis Date  . Thyroid disease   . Cancer   . Breast cancer 2016; 2005    Separate primaries; 2016 - DCIS of R breast; ER/PR+    PAST SURGICAL HISTORY: Past Surgical History  Procedure Laterality Date  . Appendectomy    . Brain surgery      FAMILY HISTORY Family History  Problem Relation Age of Onset  . Dementia Mother   . Other Mother 109    "spot on lung"; secondhand smoke exposure  . Pulmonary fibrosis Father   . Diabetes Brother   . Thyroid disease Brother   . Leukemia Brother 70    chronic lymphocytic   . Stomach cancer Maternal Grandmother  dx. 54s  . Heart attack Maternal Grandfather   . Diabetes Paternal Grandfather   . Stroke Paternal Grandfather   . Parkinson's disease Maternal Aunt   . Diabetes Paternal Aunt   . Other Paternal Aunt     bowel obstruction  . Heart Problems Paternal Uncle   . Ovarian cancer Maternal Aunt     dx. 44s;  paternal half sister of her mother  . Multiple myeloma Maternal Aunt   . Multiple myeloma Maternal Aunt 75  . Kidney failure Paternal Aunt   . Pulmonary fibrosis Paternal Aunt   . Stroke Paternal Aunt   . Heart Problems Paternal Aunt   . Emphysema Paternal Uncle   . Heart Problems Paternal Uncle   . Lung cancer Paternal Uncle     smoker  . Colon cancer Cousin   . Breast cancer Cousin     dx. 43s   [facility] Cancer father died at the age of 38 from pulmonary fibrosis. Her mother died at age 71 with severe dementia. The patient had one brother no sisters. Her maternal grandmother died from stomach cancer the age of 81. The patient's mother had a half sister with ovarian cancer.  GYNECOLOGIC HISTORY:  No LMP recorded. Patient is postmenopausal.  menarche age 27, she isGX P0. Menopause 2003. Did not take hormone replacement  SOCIAL HISTORY:  Works as a Cabin crew, Publishing copy in renovations. At home it's her and Elizabeth Ramirez , who also is in real estate, with her own investment business.Elizabeth Ramirez is a catholic, though not currently practicing    ADVANCED DIRECTIVES:  Elizabeth Ramirez is Elizabeth Ramirez as healthcare part turning   HEALTH MAINTENANCE: History  Substance Use Topics  . Smoking status: Former Smoker -- 1.00 packs/day for 9 years    Types: Cigarettes    Quit date: 06/16/1983  . Smokeless tobacco: Never Used  . Alcohol Use: 4.2 oz/week    7 Glasses of wine per week     Comment: one glass w/ dinner     Colonoscopy:  20/15, [place] she knee  PAP: up-to-date/Elizabeth Ramirez  Bone density: due  Lipid panel:  Allergies  Allergen Reactions  . Tetracyclines & Related Swelling    tongue swelling and peeling  . Levofloxacin Other (See Comments)    Affected tendons  . Other Other (See Comments)    Crabs and bay leaves    Current Outpatient Prescriptions  Medication Sig Dispense Refill  . Calcium Carbonate-Vitamin D (CALTRATE 600+D PO) Take 1 tablet by mouth 2 (two) times daily.    . diphenhydrAMINE (BENADRYL  ALLERGY) 25 MG tablet Take 25 mg by mouth every 4 (four) hours.    Marland Kitchen ipratropium (ATROVENT) 0.03 % nasal spray Place 2 sprays into the nose every 12 (twelve) hours. 30 mL 0  . levothyroxine (SYNTHROID, LEVOTHROID) 137 MCG tablet Take 137 mcg by mouth daily before breakfast.    . loratadine-pseudoephedrine (CLARITIN-D 12-HOUR) 5-120 MG per tablet Take 1 tablet by mouth 2 (two) times daily.    . Omega-3 Fatty Acids (FISH OIL) 1200 MG CAPS Take 1,200 mg by mouth daily.     No current facility-administered medications for this visit.    OBJECTIVE:  Middle-aged white woman in no acute distress Filed Vitals:   01/14/15 1617  BP: 137/62  Ramirez: 79  Temp: 97.8 F (36.6 C)  Resp: 18     Body mass index is 43.15 kg/(m^2).    ECOG FS:1 - Symptomatic but completely ambulatory  Ocular: Sclerae unicteric, pupils equal, round and reactive  to light Ear-nose-throat: Oropharynx clear and moist Lymphatic: No cervical or supraclavicular adenopathy Lungs no rales or rhonchi, good excursion bilaterally Heart regular rate and rhythm, no murmur appreciated Abd soft,  Obese,nontender, positive bowel sounds MSK no focal spinal tenderness, no joint edema Neuro: non-focal, well-oriented, appropriate affect Breasts:  The right breast is status post prior lumpectomy and sentinel lymph node sampling. Those incisions have healed nicely. I do not palpate any suspicious mass and there is no skin or nipple change of concern. The right axilla is benign. The left breast is unremarkable   LAB RESULTS:  CMP     Component Value Date/Time   NA 140 12/04/2008 1133   K 4.7 12/04/2008 1133   CL 105 12/04/2008 1133   CO2 25 12/04/2008 1133   GLUCOSE 93 12/04/2008 1133   BUN 15 12/04/2008 1133   CREATININE 0.98 12/04/2008 1133   CALCIUM 9.4 12/04/2008 1133   PROT 6.6 12/04/2008 1133   ALBUMIN 4.2 12/04/2008 1133   AST 18 12/04/2008 1133   ALT 18 12/04/2008 1133   ALKPHOS 79 12/04/2008 1133   BILITOT 1.0 12/04/2008  1133    INo results found for: SPEP, UPEP  Lab Results  Component Value Date   WBC 6.1 12/04/2008   NEUTROABS 4.2 12/04/2008   HGB 14.7 12/04/2008   HCT 40.9 12/04/2008   MCV 89.2 12/04/2008   PLT 200 12/04/2008      Chemistry      Component Value Date/Time   NA 140 12/04/2008 1133   K 4.7 12/04/2008 1133   CL 105 12/04/2008 1133   CO2 25 12/04/2008 1133   BUN 15 12/04/2008 1133   CREATININE 0.98 12/04/2008 1133      Component Value Date/Time   CALCIUM 9.4 12/04/2008 1133   ALKPHOS 79 12/04/2008 1133   AST 18 12/04/2008 1133   ALT 18 12/04/2008 1133   BILITOT 1.0 12/04/2008 1133       Lab Results  Component Value Date   LABCA2 22 12/04/2008    No components found for: [id number]  No results for input(s): INR in the last 168 hours.  Urinalysis    Component Value Date/Time   BILIRUBINUR neg 12/19/2014 1427   PROTEINUR 30 12/19/2014 1427   UROBILINOGEN 0.2 12/19/2014 1427   NITRITE neg 12/19/2014 1427   LEUKOCYTESUR large (3+)* 12/19/2014 1427    STUDIES: Mm Digital Diagnostic Unilat R  12/21/2014   CLINICAL DATA:  Suspicious microcalcifications right breast post biopsy.  EXAM: DIAGNOSTIC RIGHT MAMMOGRAM POST STEREOTACTIC BIOPSY  COMPARISON:  Previous exam(s).  FINDINGS: Mammographic images were obtained following right breast stereotactic vacuum guided biopsy of calcifications in the right breast 12 o'clock. Cc and lateral views of the right breast demonstrate biopsy clip in the area of concern.  IMPRESSION: Post biopsy mammogram demonstrating biopsy clip in the area of concern.  Final Assessment: Post Procedure Mammograms for Marker Placement   Electronically Signed   By: Elizabeth Ramirez M.D.   On: 12/21/2014 15:21   Mm Radiologist Eval And Mgmt  12/24/2014   ADDENDUM REPORT: 12/24/2014 15:33  ADDENDUM: Pathology results were deemed concordant by Elizabeth Ramirez. I discussed these pathology results with the patient today, and the need for surgical consultation was  discussed.   Electronically Signed   By: Elizabeth Ramirez M.D.   On: 12/24/2014 15:33   12/24/2014   EXAM: ESTABLISHED PATIENT OFFICE VISIT - LEVEL II  CHIEF COMPLAINT: Patient with history of recent stereotactic biopsy for right breast calcifications presenting  for pathology results and post biopsy evaluation.  HISTORY OF PRESENT ILLNESS: Right breast calcifications status post stereotactic biopsy revealing DCIS.  EXAM: Patient without complaint status post stereotactic biopsy. No evidence of postprocedural complications. No evidence of postprocedural infection or significant hematoma.  PATHOLOGY: Breast, right, needle core biopsy, 12 o'clock- DUCTAL CARCINOMA IN SITU WITH NECROSIS AND CALCIFICATIONS,  ASSESSMENT AND PLAN: ASSESSMENT AND PLAN Recommend surgical consultation for excision.  Electronically Signed: By: Elizabeth Ramirez M.D. On: 12/24/2014 15:12   Mm Rt Breast Bx W Loc Dev 1st Lesion Image Bx Spec Stereo Guide  12/21/2014   CLINICAL DATA:  Suspicious right breast calcifications for biopsy  EXAM: RIGHT BREAST STEREOTACTIC CORE NEEDLE BIOPSY  COMPARISON:  Previous exams.  FINDINGS: The patient and I discussed the procedure of stereotactic-guided biopsy including benefits and alternatives. We discussed the high likelihood of a successful procedure. We discussed the risks of the procedure including infection, bleeding, tissue injury, clip migration, and inadequate sampling. Informed written consent was given. The usual time out protocol was performed immediately prior to the procedure.  Using sterile technique and 2% Lidocaine as local anesthetic, under stereotactic guidance, a 9 gauge vacuum assisted stereotactic device was used to perform core needle biopsy of calcifications in the lower outer quadrant of the left breast using a cranial approach. Specimen radiograph was performed showing inclusion a calcifications of concern. Specimens with calcifications are identified for pathology.  At the conclusion of  the procedure, a tissue marker clip was deployed into the biopsy cavity. Follow-up 2-view mammogram was performed and dictated separately.  IMPRESSION: Stereotactic-guided biopsy of right breast. No apparent complications.   Electronically Signed   By: Elizabeth Ramirez M.D.   On: 12/21/2014 15:06    ASSESSMENT: 69 y.o. [place] woman  (1) status post right lumpectomy April 2005 for a pT1a pN0, stage IA invasive tubular breast cancer, estrogen and progesterone receptor positive, HER-2 negative, in the setting of DCIS;   (a) did not receive radiation  (b) status post anastrozole for 5 years completed June 2010  (2) status post right breast upper outer quadrant biopsy 12/21/2014 for ductal carcinoma in situ, grade 2 or 3, estrogen and progesterone receptor positive.  PLAN: We spent the better part of today's hour-long appointment discussing the biology of breast cancer in general, and the specifics of the patient's tumor in particular.The patient understands that in noninvasive ductal carcinoma, also called ductal carcinoma in situ ("DCIS") the breast cancer cells remain trapped in the ducts were they started. They cannot travel to a vital organ. For that reason these cancers in themselves are not life-threatening.  If the whole breast is removed then all the ducts are removed and since the cancer cells are trapped in the ducts, the cure rate with mastectomy for noninvasive breast cancer is approximately 99%. Nevertheless we recommend lumpectomy, because there is no survival advantage to mastectomy and because the cosmetic result is generally superior with breast conservation.  In patients who carry a deleterious mutations such as for example BRCA, the risk of a new breast cancer developing may be sufficiently great that the patient may choose bilateral mastectomies. However if the patient wishes to keep her breasts in that situation it is safe to do so. That would require intensified screening, which  generally means not only yearly mammography but a yearly breast MRI as well. Of course, if there is a deleterious mutation bilateral oophorectomy would be necessary as there is no standard screening protocol for ovarian cancer.  Kaylanie has a good understanding of all these facts. Because of her father's history of pulmonary fibrosis she remains very concerned regarding postoperative radiation. She understands that this is completely standard and that it will reduce the risk of recurrence in the breast by more than half. Furthermore she is interested in avoiding further anti-estrogens and I think this would be fairly reasonable if she does take radiation but more of an issue if she does not. For those reasons I strongly urged her to bring her questions to Dr. Pablo Ledger next week and clarify concerns.   If the genetics testing is negative, as I expect, the patient will proceed to breast MRI and then surgery under Dr. Excell Seltzer. She would then proceed to radiation and this is can I get take her into October.  Accordingly she will see me again in November. She knows I will be glad to discuss any questions or concerns with her before then as necessary.  When she returns to see me will make a definitive decision regarding anti-estrogens.   The patient has a good understanding of the overall plan. She agrees with it. She knows the goal of treatment in her case is cure. She will call with any problems that may develop before her next visit here.  Chauncey Cruel, MD   01/14/2015 5:24 PM Medical Oncology and Hematology [facility] [address] [place], [place] [address] Tel. [phone]    Fax. [phone]

## 2015-01-14 ENCOUNTER — Encounter: Payer: Self-pay | Admitting: Oncology

## 2015-01-14 ENCOUNTER — Ambulatory Visit: Payer: No Typology Code available for payment source

## 2015-01-14 ENCOUNTER — Ambulatory Visit (HOSPITAL_BASED_OUTPATIENT_CLINIC_OR_DEPARTMENT_OTHER): Payer: No Typology Code available for payment source | Admitting: Oncology

## 2015-01-14 VITALS — BP 137/62 | HR 79 | Temp 97.8°F | Resp 18 | Ht 64.0 in | Wt 251.5 lb

## 2015-01-14 DIAGNOSIS — C50411 Malignant neoplasm of upper-outer quadrant of right female breast: Secondary | ICD-10-CM | POA: Insufficient documentation

## 2015-01-14 DIAGNOSIS — Z803 Family history of malignant neoplasm of breast: Secondary | ICD-10-CM

## 2015-01-14 DIAGNOSIS — D0511 Intraductal carcinoma in situ of right breast: Secondary | ICD-10-CM | POA: Diagnosis not present

## 2015-01-14 DIAGNOSIS — Z853 Personal history of malignant neoplasm of breast: Secondary | ICD-10-CM | POA: Diagnosis not present

## 2015-01-14 DIAGNOSIS — Z807 Family history of other malignant neoplasms of lymphoid, hematopoietic and related tissues: Secondary | ICD-10-CM

## 2015-01-14 DIAGNOSIS — Z8041 Family history of malignant neoplasm of ovary: Secondary | ICD-10-CM

## 2015-01-14 DIAGNOSIS — C50911 Malignant neoplasm of unspecified site of right female breast: Secondary | ICD-10-CM

## 2015-01-14 DIAGNOSIS — Z170 Estrogen receptor positive status [ER+]: Secondary | ICD-10-CM | POA: Diagnosis not present

## 2015-01-14 DIAGNOSIS — Z801 Family history of malignant neoplasm of trachea, bronchus and lung: Secondary | ICD-10-CM

## 2015-01-14 HISTORY — DX: Malignant neoplasm of upper-outer quadrant of right female breast: C50.411

## 2015-01-17 ENCOUNTER — Telehealth: Payer: Self-pay | Admitting: Genetic Counselor

## 2015-01-17 ENCOUNTER — Ambulatory Visit: Payer: No Typology Code available for payment source | Admitting: Radiation Oncology

## 2015-01-17 ENCOUNTER — Ambulatory Visit: Payer: No Typology Code available for payment source

## 2015-01-18 ENCOUNTER — Ambulatory Visit: Payer: Self-pay | Admitting: Genetic Counselor

## 2015-01-18 DIAGNOSIS — C50911 Malignant neoplasm of unspecified site of right female breast: Secondary | ICD-10-CM

## 2015-01-18 DIAGNOSIS — Z809 Family history of malignant neoplasm, unspecified: Secondary | ICD-10-CM

## 2015-01-18 DIAGNOSIS — Z1379 Encounter for other screening for genetic and chromosomal anomalies: Secondary | ICD-10-CM

## 2015-01-18 HISTORY — DX: Encounter for other screening for genetic and chromosomal anomalies: Z13.79

## 2015-01-18 NOTE — Progress Notes (Signed)
GENETIC TEST RESULTS  HPI: [name] was previously seen in the [facility] clinic due to a personal history of breast cancer, family history of ovarian and other cancers, and concerns regarding a hereditary predisposition to cancer. Please refer to our prior cancer genetics clinic note from January 01, 2015 for more information regarding Elizabeth Ramirez's medical, social and family histories, and our assessment and recommendations, at the time. [name] recent genetic test results were disclosed to her, as were recommendations warranted by these results. These results and recommendations are discussed in more detail below.  GENETIC TEST RESULTS: At the time of Elizabeth Ramirez's visit on 01/01/15, we recommended she pursue genetic testing of the 20-gene Breast/Ovarian Cancer Panel through [facility] Elizabeth Pigeon, MD).  The Breast/Ovarian gene panel offered by [facility] includes sequencing and deletion/duplication analysis for the following 19 genes:  ATM, BARD1, BRCA1, BRCA2, BRIP1, CDH1, CHEK2, FANCC, MLH1, MSH2, MSH6, NBN, PALB2, PMS2, PTEN, RAD51C, RAD51D, TP53, and XRCC2.  This panel also includes deletion/duplication analysis (without sequencing) for one gene, EPCAM.  Those results are now back, the report date of which is January 14, 2015.  Genetic testing was normal, and did not reveal a deleterious mutation in these genes.  Additionally, no variants of uncertain significance (VUSs) were found.  The test report will be scanned into EPIC and will be located under the Media tab.   We discussed with [name] that since the current genetic testing is not perfect, it is possible there may be a gene mutation in one of these genes that current testing cannot detect, but that chance is small. We also discussed, that it is possible that another gene that has not yet been discovered, or that we have not yet tested, is responsible for the cancer diagnoses in the family, and it is,  therefore, important to remain in touch with cancer genetics in the future so that we can continue to offer [name] the most up to date genetic testing.   CANCER SCREENING RECOMMENDATIONS:  While we still do not have an explanation for the personal or family history of cancer, this result is reassuring and indicates that [name] likely does not have an increased risk for a future cancer due to a mutation in one of these genes. This normal test also suggests that Elizabeth Ramirez's cancer was most likely not due to an inherited predisposition associated with one of these genes.  Most cancers happen by chance and this negative test suggests that her cancer falls into this category, in particular because there are many family members (including Elizabeth Ramirez's parents) who are unaffected by cancer.  We, therefore, recommended she continue to follow the cancer management and screening guidelines provided by her oncology and primary healthcare providers.   RECOMMENDATIONS FOR FAMILY MEMBERS: Women in this family might be at some increased risk of developing cancer, over the general population risk, simply due to the family history of cancer. We recommended women in this family have a yearly mammogram beginning at age 76, or 61 years younger than the earliest onset of cancer, an an annual clinical breast exam, and perform monthly breast self-exams. Women in this family should also have a gynecological exam as recommended by their primary provider. All family members should have a colonoscopy by age 25.  Based on Elizabeth Ramirez's family history, her maternal first cousins (the daughters of the aunt who passed away of ovarian cancer) would be eligible for genetic counseling and testing.  [name]  is not in contact with these relatives, however, and does not currently have the ability to reach out to them.  In the future, [name] will let us know if we can be of any assistance in coordinating genetic  counseling and/or testing for these family members.  FOLLOW-UP: Lastly, we discussed with [name] that cancer genetics is a rapidly advancing field and it is possible that new genetic tests will be appropriate for her and/or her family members in the future. We encouraged her to remain in contact with cancer genetics on an annual basis so we can update her personal and family histories and let her know of advances in cancer genetics that may benefit this family.   Our contact number was provided. [name] questions were answered to her satisfaction, and she knows she is welcome to call us at anytime with additional questions or concerns.   Jeanine Luz, MS Genetic Counselor [email or web address] Phone: [phone]

## 2015-01-18 NOTE — Telephone Encounter (Signed)
Discussed with [name] that her genetic test results were negative for changes within any of 20 genes that would cause her to be at an increased risk for breast, ovarian, and other related types of cancer.  Additionally, no uncertain changes were found.  We still do not have an explanation for the personal or family history of cancer, but most cancers happen by chance.  We discussed that this result and the family history (several relatives, including [name]'s parents are unaffected by cancer) is further reassuring that this is the likely the case.  Future cancer screening for [name] and her relatives will be based on the personal and family history of cancer.  Women in the family are at a somewhat increased risk for cancer based on the history and should begin mammogram screening by the age of 12, if they have not already done so.  We discussed that [name]'s maternal first cousins through her aunt who passed away of ovarian cancer, would be eligible for genetic counseling and testing, in the event that she would ever have the ability of re-establishing contact with them.

## 2015-01-21 ENCOUNTER — Encounter: Payer: Self-pay | Admitting: *Deleted

## 2015-01-21 ENCOUNTER — Telehealth: Payer: Self-pay | Admitting: Oncology

## 2015-01-21 NOTE — Telephone Encounter (Signed)
Left message to confirm appointment for October/November. Mailed calendar

## 2015-01-21 NOTE — Progress Notes (Addendum)
Location of Breast Cancer:Right Breast ductal carcinoma in situ  Histology per Pathology Report:12/21/14   Diagnosis Breast, right, needle core biopsy, 12 o'clock - DUCTAL CARCINOMA IN SITU WITH NECROSIS AND CALCIFICATIONS, SEE COMMENT. Receptor Status: ER(+,95% PR (+60%), Her2-neu (neg)  Did patient present with symptoms (if so, please note symptoms) or was this found on screening mammography?: routine screening mammogram,   Past/Anticipated interventions by surgeon, if any: Referral Dr. Melina Schools 04/29/15 right total knee arthroplasty pre scheduled  Past/Anticipated interventions by medical oncology, if any: Chemotherapy : Dr. [name], 01/14/15 Breast clinic,Genetic testing  Was neg, will proceed with MRI and then surgery with Dr. Excell Seltzer  Lymphedema issues, if any: No   Pain issues, if any:No  SAFETY ISSUES:  Prior radiation? NO  Pacemaker/ICD?   Possible current pregnancy?post-menopausal  Is the patient on methotrexate?No  Current Complaints / other details:Here with [name].,No pregnancies.menarache age 2, GOPO;menopause [age] S/P Right  Lumpectomy  April 2005 no radiation, followed by hormonal therapy  Anastrozole 5 years completed June 2010,Former smoker 1ppd x 9 years,,Quit 06/16/1983; occasional alcohol ,  Brother Leukemia  ,Maternal Aunt Multiple myeloma,, Maternal Aunt ovarian ca dx 30's, paternal half sister of mother;Paternal Uncle,Lung ca(smoker), cousin breast ca, cousin colon ca, Father died age 65 pulmonary fibrosis, mother died age 29 severe dementia,  Allergies:  Tetracyclines & related=swelling, Levofloxacin, crabs & bay leaves Emmaline Kluver, Kathyrn Drown, RN 01/21/2015,8:13 AM

## 2015-01-23 ENCOUNTER — Ambulatory Visit
Admission: RE | Admit: 2015-01-23 | Discharge: 2015-01-23 | Disposition: A | Discharge status: Home / Self Care | Payer: No Typology Code available for payment source | Source: Ambulatory Visit | Attending: Radiation Oncology | Admitting: Radiation Oncology

## 2015-01-23 ENCOUNTER — Encounter: Payer: Self-pay | Admitting: Radiation Oncology

## 2015-01-23 VITALS — BP 144/86 | HR 88 | Temp 98.2°F | Wt 254.7 lb

## 2015-01-23 DIAGNOSIS — C50911 Malignant neoplasm of unspecified site of right female breast: Secondary | ICD-10-CM

## 2015-01-23 DIAGNOSIS — D0591 Unspecified type of carcinoma in situ of right breast: Secondary | ICD-10-CM | POA: Insufficient documentation

## 2015-01-23 DIAGNOSIS — Z510 Encounter for antineoplastic radiation therapy: Secondary | ICD-10-CM | POA: Insufficient documentation

## 2015-01-23 HISTORY — DX: Allergy, unspecified, initial encounter: T78.40XA

## 2015-01-23 NOTE — Progress Notes (Signed)
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 She underwent right lumpectomy and sentinel lymph node biopsy. Her tumor was ER/PR positive with negative margins. She received anastrozole between June 2005 and June 2010. She underwent a screening mammogram in July of this year. A group of pleomorphic calcifications were seen in the right breast measuring 2.8 cm. Biopsy showed grade II DCIS, this was ER/PR positive. She has an MRI of the bilateral breast ordered, but not scheduled. She is waiting to see Dr. Excell Seltzer to discuss surgery until after the MRI.  She has seen Dr. [name] and discussed antiestrogen therapy. She has many concerns about radiation. She has undergone genetic testing which is negative for genetic mutations. She is accompanied by her partner. She has kne surgery scheduled in November.   PREVIOUS RADIATION THERAPY: No  Past medical, social and family history were reviewed in the electronic chart. Review of symptoms was reviewed in the electronic chart. Medications were reviewed in the electronic chart.   PHYSICAL EXAM:  Filed Vitals:   01/23/15 1333  BP: 144/86  Pulse: 88  Temp: 98.2 F (36.8 C)  .254 lb 11.2 oz (115.531 kg). Pt is a pleasant woman who is alert and oriented times three. No respiratory distress.  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 She doesn't want a mastectomy due to symmetry issues. We also discussed since that this is a recurrent disease in the same breast, I recommend adjuvant radiation even though this is  Stage 0. She had many questions including skin irritation, lung damage, and secondary malignancies. We discussed each one of these in detail. We discussed the process of CT simulation and the placement of tattoos. We discussed 5 weeks of radiation treatment as an outpatient and hopefully finish her treatment in time for a month of rest before her knee surgery on April 29, 2015. She has several conferences in the Fall and we discussed that she cannot miss more than 4 treatments without starting to affect her outcome.  She is going to have her MRI scheduled now that her genetic testing is back and will follow up with Dr. Excell Seltzer to get her surgery scheduled. I will plan on seeing her back after her surgery to schedule simulation. She was given information on simulation and the use of a CT scan to avoid critical structures including her lung, which she is concerned about due to her family hx of pulmonary fibrosis.  I advised the pt that it is fine for her to receive steroid injections into her shoulder and knee for pain. I also gave the pt a calendar on the "After Breast Cancer Class" that might help her with her shoulder ROM.  I spent 40 minutes  face to face with the patient and more than 50% of that time was spent in counseling and/or coordination of care.  This document serves as a record of services personally performed by Thea Silversmith, MD. It was created on her behalf by Darcus Austin, a trained medical scribe. The creation of this record is based on the scribe's personal observations  and the provider's statements to them. This document has been checked and approved by the attending provider.    ------------------------------------------------  Thea Silversmith, MD

## 2015-02-01 ENCOUNTER — Other Ambulatory Visit: Payer: Self-pay | Admitting: General Surgery

## 2015-02-01 ENCOUNTER — Ambulatory Visit
Admission: RE | Admit: 2015-02-01 | Discharge: 2015-02-01 | Disposition: A | Discharge status: Home / Self Care | Payer: No Typology Code available for payment source | Source: Ambulatory Visit | Attending: General Surgery | Admitting: General Surgery

## 2015-02-01 DIAGNOSIS — D0511 Intraductal carcinoma in situ of right breast: Secondary | ICD-10-CM

## 2015-02-01 MED ORDER — GADOBENATE DIMEGLUMINE 529 MG/ML IV SOLN
20.0000 mL | Freq: Once | INTRAVENOUS | Status: AC | PRN
  Administered 2015-02-01: 20 mL via INTRAVENOUS

## 2015-02-05 ENCOUNTER — Other Ambulatory Visit: Payer: Self-pay | Admitting: General Surgery

## 2015-02-05 DIAGNOSIS — D0511 Intraductal carcinoma in situ of right breast: Secondary | ICD-10-CM

## 2015-02-07 ENCOUNTER — Encounter (HOSPITAL_BASED_OUTPATIENT_CLINIC_OR_DEPARTMENT_OTHER): Payer: Self-pay | Admitting: *Deleted

## 2015-02-08 ENCOUNTER — Encounter (HOSPITAL_BASED_OUTPATIENT_CLINIC_OR_DEPARTMENT_OTHER): Payer: Self-pay | Admitting: *Deleted

## 2015-02-11 ENCOUNTER — Encounter (HOSPITAL_BASED_OUTPATIENT_CLINIC_OR_DEPARTMENT_OTHER)
Admission: RE | Disposition: A | Discharge status: Home / Self Care | Payer: Self-pay | Source: Ambulatory Visit | Attending: General Surgery

## 2015-02-11 ENCOUNTER — Ambulatory Visit (HOSPITAL_BASED_OUTPATIENT_CLINIC_OR_DEPARTMENT_OTHER)
Admission: RE | Admit: 2015-02-11 | Discharge: 2015-02-11 | Disposition: A | Discharge status: Home / Self Care | Payer: No Typology Code available for payment source | Source: Ambulatory Visit | Attending: General Surgery | Admitting: General Surgery

## 2015-02-11 ENCOUNTER — Ambulatory Visit (HOSPITAL_BASED_OUTPATIENT_CLINIC_OR_DEPARTMENT_OTHER): Payer: No Typology Code available for payment source | Admitting: Anesthesiology

## 2015-02-11 ENCOUNTER — Encounter (HOSPITAL_BASED_OUTPATIENT_CLINIC_OR_DEPARTMENT_OTHER): Payer: Self-pay | Admitting: Anesthesiology

## 2015-02-11 ENCOUNTER — Ambulatory Visit
Admission: RE | Admit: 2015-02-11 | Discharge: 2015-02-11 | Disposition: A | Discharge status: Home / Self Care | Payer: No Typology Code available for payment source | Source: Ambulatory Visit | Attending: General Surgery | Admitting: General Surgery

## 2015-02-11 DIAGNOSIS — Z170 Estrogen receptor positive status [ER+]: Secondary | ICD-10-CM | POA: Insufficient documentation

## 2015-02-11 DIAGNOSIS — Z87891 Personal history of nicotine dependence: Secondary | ICD-10-CM | POA: Diagnosis not present

## 2015-02-11 DIAGNOSIS — Z6841 Body Mass Index (BMI) 40.0 and over, adult: Secondary | ICD-10-CM | POA: Insufficient documentation

## 2015-02-11 DIAGNOSIS — E039 Hypothyroidism, unspecified: Secondary | ICD-10-CM | POA: Insufficient documentation

## 2015-02-11 DIAGNOSIS — D0511 Intraductal carcinoma in situ of right breast: Secondary | ICD-10-CM | POA: Insufficient documentation

## 2015-02-11 DIAGNOSIS — Z881 Allergy status to other antibiotic agents status: Secondary | ICD-10-CM | POA: Insufficient documentation

## 2015-02-11 DIAGNOSIS — C50911 Malignant neoplasm of unspecified site of right female breast: Secondary | ICD-10-CM

## 2015-02-11 HISTORY — DX: Other specified postprocedural states: Z98.890

## 2015-02-11 HISTORY — PX: BREAST LUMPECTOMY WITH NEEDLE LOCALIZATION: SHX5759

## 2015-02-11 HISTORY — DX: Unspecified osteoarthritis, unspecified site: M19.90

## 2015-02-11 HISTORY — DX: Nausea with vomiting, unspecified: R11.2

## 2015-02-11 HISTORY — DX: Hypothyroidism, unspecified: E03.9

## 2015-02-11 SURGERY — BREAST LUMPECTOMY WITH NEEDLE LOCALIZATION
Anesthesia: General | Site: Breast | Laterality: Right

## 2015-02-11 MED ORDER — CHLORHEXIDINE GLUCONATE 4 % EX LIQD: 1.0000 "application " | Freq: Once | CUTANEOUS | Status: DC

## 2015-02-11 MED ORDER — FENTANYL CITRATE (PF) 100 MCG/2ML IJ SOLN
INTRAMUSCULAR | Status: AC
  Filled 2015-02-11: qty 2

## 2015-02-11 MED ORDER — FENTANYL CITRATE (PF) 100 MCG/2ML IJ SOLN
50.0000 ug | INTRAMUSCULAR | Status: AC | PRN
  Administered 2015-02-11 (×2): 25 ug via INTRAVENOUS
  Administered 2015-02-11: 50 ug via INTRAVENOUS

## 2015-02-11 MED ORDER — SCOPOLAMINE 1 MG/3DAYS TD PT72: 1.0000 | MEDICATED_PATCH | Freq: Once | TRANSDERMAL | Status: DC | PRN

## 2015-02-11 MED ORDER — FENTANYL CITRATE (PF) 100 MCG/2ML IJ SOLN
INTRAMUSCULAR | Status: AC
  Filled 2015-02-11: qty 6

## 2015-02-11 MED ORDER — LIDOCAINE HCL (CARDIAC) 20 MG/ML IV SOLN
INTRAVENOUS | Status: DC | PRN
  Administered 2015-02-11: 50 mg via INTRAVENOUS

## 2015-02-11 MED ORDER — CEFAZOLIN SODIUM-DEXTROSE 2-3 GM-% IV SOLR
INTRAVENOUS | Status: AC
  Filled 2015-02-11: qty 50

## 2015-02-11 MED ORDER — MIDAZOLAM HCL 2 MG/2ML IJ SOLN: 1.0000 mg | INTRAMUSCULAR | Status: DC | PRN

## 2015-02-11 MED ORDER — BUPIVACAINE-EPINEPHRINE 0.5% -1:200000 IJ SOLN
INTRAMUSCULAR | Status: DC | PRN
  Administered 2015-02-11: 20 mL

## 2015-02-11 MED ORDER — LACTATED RINGERS IV SOLN
INTRAVENOUS | Status: DC
  Administered 2015-02-11: 13:00:00 via INTRAVENOUS

## 2015-02-11 MED ORDER — PROMETHAZINE HCL 25 MG/ML IJ SOLN: 6.2500 mg | INTRAMUSCULAR | Status: DC | PRN

## 2015-02-11 MED ORDER — CEFAZOLIN SODIUM-DEXTROSE 2-3 GM-% IV SOLR
2.0000 g | INTRAVENOUS | Status: AC
  Administered 2015-02-11: 2 g via INTRAVENOUS

## 2015-02-11 MED ORDER — GLYCOPYRROLATE 0.2 MG/ML IJ SOLN: 0.2000 mg | Freq: Once | INTRAMUSCULAR | Status: DC | PRN

## 2015-02-11 MED ORDER — DEXAMETHASONE SODIUM PHOSPHATE 4 MG/ML IJ SOLN
INTRAMUSCULAR | Status: DC | PRN
  Administered 2015-02-11: 10 mg via INTRAVENOUS

## 2015-02-11 MED ORDER — HYDROCODONE-ACETAMINOPHEN 5-325 MG PO TABS: 1.0000 | ORAL_TABLET | ORAL | Status: DC | PRN

## 2015-02-11 MED ORDER — FENTANYL CITRATE (PF) 100 MCG/2ML IJ SOLN
25.0000 ug | INTRAMUSCULAR | Status: DC | PRN
  Administered 2015-02-11: 50 ug via INTRAVENOUS

## 2015-02-11 MED ORDER — PROPOFOL INFUSION 10 MG/ML OPTIME
INTRAVENOUS | Status: DC | PRN
  Administered 2015-02-11: 200 ug/kg/min via INTRAVENOUS

## 2015-02-11 MED ORDER — PROPOFOL 10 MG/ML IV BOLUS
INTRAVENOUS | Status: DC | PRN
  Administered 2015-02-11: 200 mg via INTRAVENOUS

## 2015-02-11 SURGICAL SUPPLY — 41 items
APPLIER CLIP 9.375 MED OPEN (MISCELLANEOUS) ×3
BLADE SURG 15 STRL LF DISP TIS (BLADE) ×1 IMPLANT
BLADE SURG 15 STRL SS (BLADE) ×2
CANISTER SUCT 1200ML W/VALVE (MISCELLANEOUS) IMPLANT
CHLORAPREP W/TINT 26ML (MISCELLANEOUS) ×3 IMPLANT
CLIP APPLIE 9.375 MED OPEN (MISCELLANEOUS) ×1 IMPLANT
CLIP TI WIDE RED SMALL 6 (CLIP) IMPLANT
COVER BACK TABLE 60X90IN (DRAPES) ×3 IMPLANT
COVER MAYO STAND STRL (DRAPES) ×3 IMPLANT
DEVICE DUBIN W/COMP PLATE 8390 (MISCELLANEOUS) ×3 IMPLANT
DRAPE LAPAROTOMY 100X72 PEDS (DRAPES) ×3 IMPLANT
DRAPE UTILITY XL STRL (DRAPES) ×3 IMPLANT
ELECT COATED BLADE 2.86 ST (ELECTRODE) ×3 IMPLANT
ELECT REM PT RETURN 9FT ADLT (ELECTROSURGICAL) ×3
ELECTRODE REM PT RTRN 9FT ADLT (ELECTROSURGICAL) ×1 IMPLANT
GLOVE BIOGEL PI IND STRL 6.5 (GLOVE) ×2 IMPLANT
GLOVE BIOGEL PI IND STRL 8 (GLOVE) ×1 IMPLANT
GLOVE BIOGEL PI INDICATOR 6.5 (GLOVE) ×4
GLOVE BIOGEL PI INDICATOR 8 (GLOVE) ×2
GLOVE ECLIPSE 6.5 STRL STRAW (GLOVE) ×3 IMPLANT
GLOVE ECLIPSE 7.5 STRL STRAW (GLOVE) ×3 IMPLANT
GOWN STRL REUS W/ TWL LRG LVL3 (GOWN DISPOSABLE) ×1 IMPLANT
GOWN STRL REUS W/ TWL XL LVL3 (GOWN DISPOSABLE) ×1 IMPLANT
GOWN STRL REUS W/TWL LRG LVL3 (GOWN DISPOSABLE) ×2
GOWN STRL REUS W/TWL XL LVL3 (GOWN DISPOSABLE) ×2
KIT MARKER MARGIN INK (KITS) ×3 IMPLANT
LIQUID BAND (GAUZE/BANDAGES/DRESSINGS) ×3 IMPLANT
NEEDLE HYPO 25X1 1.5 SAFETY (NEEDLE) ×3 IMPLANT
NS IRRIG 1000ML POUR BTL (IV SOLUTION) ×3 IMPLANT
PACK BASIN DAY SURGERY FS (CUSTOM PROCEDURE TRAY) ×3 IMPLANT
PENCIL BUTTON HOLSTER BLD 10FT (ELECTRODE) ×3 IMPLANT
SLEEVE SCD COMPRESS KNEE MED (MISCELLANEOUS) ×3 IMPLANT
SUT MON AB 5-0 PS2 18 (SUTURE) ×3 IMPLANT
SUT VICRYL 3-0 CR8 SH (SUTURE) ×3 IMPLANT
SYR BULB 3OZ (MISCELLANEOUS) IMPLANT
SYR CONTROL 10ML LL (SYRINGE) ×3 IMPLANT
TOWEL OR 17X24 6PK STRL BLUE (TOWEL DISPOSABLE) IMPLANT
TOWEL OR NON WOVEN STRL DISP B (DISPOSABLE) ×3 IMPLANT
TUBE CONNECTING 20'X1/4 (TUBING)
TUBE CONNECTING 20X1/4 (TUBING) IMPLANT
YANKAUER SUCT BULB TIP NO VENT (SUCTIONS) IMPLANT

## 2015-02-11 NOTE — Discharge Instructions (Signed)
[facility] Office Phone Number [phone]  BREAST BIOPSY/ Lumpectomy: POST OP INSTRUCTIONS  Always review your discharge instruction sheet given to you by the facility where your surgery was performed.  IF YOU HAVE DISABILITY OR FAMILY LEAVE FORMS, YOU MUST BRING THEM TO THE OFFICE FOR PROCESSING.  DO NOT GIVE THEM TO YOUR DOCTOR.  1. A prescription for pain medication may be given to you upon discharge.  Take your pain medication as prescribed, if needed.  If narcotic pain medicine is not needed, then you may take acetaminophen (Tylenol) or ibuprofen (Advil) as needed. 2. Take your usually prescribed medications unless otherwise directed 3. If you need a refill on your pain medication, please contact your pharmacy.  They will contact our office to request authorization.  Prescriptions will not be filled after 5pm or on week-ends. 4. You should eat very light the first 24 hours after surgery, such as soup, crackers, pudding, etc.  Resume your normal diet the day after surgery. 5. Most patients will experience some swelling and bruising in the breast.  Ice packs and a good support bra will help.  Swelling and bruising can take several days to resolve.  6. It is common to experience some constipation if taking pain medication after surgery.  Increasing fluid intake and taking a stool softener will usually help or prevent this problem from occurring.  A mild laxative (Milk of Magnesia or Miralax) should be taken according to package directions if there are no bowel movements after 48 hours. 7. Unless discharge instructions indicate otherwise, you may remove your bandages 24-48 hours after surgery, and you may shower at that time.  You may have steri-strips (small skin tapes) in place directly over the incision.  These strips should be left on the skin for 7-10 days.  If your surgeon used skin glue on the incision, you may shower in 24 hours.  The glue will flake off over the next 2-3  weeks.  Any sutures or staples will be removed at the office during your follow-up visit. 8. ACTIVITIES:  You may resume regular daily activities (gradually increasing) beginning the next day.  Wearing a good support bra or sports bra minimizes pain and swelling.  You may have sexual intercourse when it is comfortable. a. You may drive when you no longer are taking prescription pain medication, you can comfortably wear a seatbelt, and you can safely maneuver your car and apply brakes. b. RETURN TO WORK:  ______________________________________________________________________________________ 9. You should see your doctor in the office for a follow-up appointment approximately two weeks after your surgery.  Your doctors nurse will typically make your follow-up appointment when she calls you with your pathology report.  Expect your pathology report 2-3 business days after your surgery.    WHEN TO CALL YOUR DOCTOR: 1. Fever over 101.0 2. Nausea and/or vomiting. 3. Extreme swelling or bruising. 4. Continued bleeding from incision. 5. Increased pain, redness, or drainage from the incision.  The clinic staff is available to answer your questions during regular business hours.  Please dont hesitate to call and ask to speak to one of the nurses for clinical concerns.  If you have a medical emergency, go to the nearest emergency room or call 911.  A surgeon from [facility] is always on call at the hospital.  For further questions, please visit [email or web address]     Post Anesthesia Home Care Instructions  Activity: Get plenty of rest for the remainder of the day. A responsible adult  should stay with you for 24 hours following the procedure.  For the next 24 hours, DO NOT: -Drive a car -Paediatric nurse -Drink alcoholic beverages -Take any medication unless instructed by your physician -Make any legal decisions or sign important papers.  Meals: Start with liquid foods such  as gelatin or soup. Progress to regular foods as tolerated. Avoid greasy, spicy, heavy foods. If nausea and/or vomiting occur, drink only clear liquids until the nausea and/or vomiting subsides. Call your physician if vomiting continues.  Special Instructions/Symptoms: Your throat may feel dry or sore from the anesthesia or the breathing tube placed in your throat during surgery. If this causes discomfort, gargle with warm salt water. The discomfort should disappear within 24 hours.  If you had a scopolamine patch placed behind your ear for the management of post- operative nausea and/or vomiting:  1. The medication in the patch is effective for 72 hours, after which it should be removed.  Wrap patch in a tissue and discard in the trash. Wash hands thoroughly with soap and water. 2. You may remove the patch earlier than 72 hours if you experience unpleasant side effects which may include dry mouth, dizziness or visual disturbances. 3. Avoid touching the patch. Wash your hands with soap and water after contact with the patch.

## 2015-02-11 NOTE — Anesthesia Procedure Notes (Signed)
Procedure Name: LMA Insertion Performed by: Ezechiel Stooksbury W Pre-anesthesia Checklist: Patient identified, Timeout performed, Emergency Drugs available, Suction available and Patient being monitored Patient Re-evaluated:Patient Re-evaluated prior to inductionOxygen Delivery Method: Circle system utilized Preoxygenation: Pre-oxygenation with 100% oxygen Intubation Type: IV induction Ventilation: Mask ventilation without difficulty LMA: LMA inserted LMA Size: 4.0 Number of attempts: 1 Placement Confirmation: positive ETCO2 and breath sounds checked- equal and bilateral Tube secured with: Tape Dental Injury: Teeth and Oropharynx as per pre-operative assessment      

## 2015-02-11 NOTE — Transfer of Care (Signed)
Immediate Anesthesia Transfer of Care Note  Patient: Elizabeth Ramirez  Procedure(s) Performed: Procedure(s): WIRE BRACKETED RIGHT BREAST LUMPECTOMY  (Right)  Patient Location: PACU  Anesthesia Type:General  Level of Consciousness: awake, alert  and oriented  Airway & Oxygen Therapy: Patient Spontanous Breathing and Patient connected to face mask oxygen  Post-op Assessment: Report given to RN and Post -op Vital signs reviewed and stable  Post vital signs: Reviewed and stable  Last Vitals:  Filed Vitals:   02/11/15 1256  BP: 128/68  Pulse: 80  Temp: 36.8 C  Resp: 20    Complications: No apparent anesthesia complications

## 2015-02-11 NOTE — Anesthesia Postprocedure Evaluation (Signed)
  Anesthesia Post-op Note  Patient: Elizabeth Ramirez  Procedure(s) Performed: Procedure(s) (LRB): WIRE BRACKETED RIGHT BREAST LUMPECTOMY  (Right)  Patient Location: PACU  Anesthesia Type: General  Level of Consciousness: awake and alert   Airway and Oxygen Therapy: Patient Spontanous Breathing  Post-op Pain: mild  Post-op Assessment: Post-op Vital signs reviewed, Patient's Cardiovascular Status Stable, Respiratory Function Stable, Patent Airway and No signs of Nausea or vomiting  Last Vitals:  Filed Vitals:   02/11/15 1639  BP:   Pulse: 82  Temp:   Resp: 16    Post-op Vital Signs: stable   Complications: No apparent anesthesia complications

## 2015-02-11 NOTE — Interval H&P Note (Signed)
History and Physical Interval Note:  02/11/2015 2:16 PM  Elizabeth Ramirez  has presented today for surgery, with the diagnosis of dcis right breast  The various methods of treatment have been discussed with the patient and family. After consideration of risks, benefits and other options for treatment, the patient has consented to  Procedure(s): WIRE BRACKETED RIGHT BREAST LUMPECTOMY  (Right) as a surgical intervention .  The patient's history has been reviewed, patient examined, no change in status, stable for surgery.  I have reviewed the patient's chart and labs.  Questions were answered to the patient's satisfaction.     Kaydra Borgen T

## 2015-02-11 NOTE — Op Note (Signed)
Preoperative Diagnosis: dcis right breast  Postoprative Diagnosis: dcis right breast  Procedure: Procedure(s): WIRE BRACKETED RIGHT BREAST LUMPECTOMY    Surgeon: Excell Seltzer T   Assistants: none  Anesthesia:  General LMA anesthesia  Indications: Patient is a 69 year old female with a previous history of right breast lumpectomy for DCIS number of years ago treated with postoperative hormone therapy for 5 years and no radiation. She now presents with a new area of calcifications in the central to upper outer right breast and large core needle biopsies revealed ductal carcinoma in situ with 2 somewhat separate area of calcifications spanning about 4 cm. MRI has shown only the known malignancy measuring somewhat smaller by MRI. After discussion of options detailed elsewhere in risks of surgery reflexed to proceed with wire bracketed right breast lumpectomy is initial surgical therapy.    Procedure Detail:  Preoperatively the patient underwent stereotactic wire bracketing of the area of calcifications with one wire posterior to the most posterior extent of the calcifications in the second wire anterior near the biopsy clip just a few calcifications anterior to the shaft of the wire. Patient was taken operating room, placed in supine position on the operating table, and laryngeal mask general anesthesia induced. She received preoperative IV antibiotics. The right breast was widely sterilely prepped and draped. Patient timeout was performed and correct patient and procedure verified. I made a curvilinear incision at the superior areolar border and dissection was carried down into the subcutaneous tissue. A skin subcutaneous flap was then raised superiorly up to the wire insertion sites on the wires were brought into the lumpectomy cavity. Using cautery excised a generous specimen of breast tissue around the shafts and tips of both wires staying just posterior to the posterior wire and a little bit  anterior to the anterior wire and about a centimeter and a half on either side extending the excision into the subareolar area of the breast around the tips of both wires. The specimen was removed and oriented with ink and specimen mammogram obtained. This showed calcifications and biopsy clip and intact wires within the specimen although the calcifications extended near the anterior margin of the specimen. I excised about another three quarters of a centimeter of anterior margin which was inked and sent for permanent pathology. There did seem to be some firm tissue centrally within the specimen but not at the periphery. The lumpectomy cavity was carefully inspected and complete hemostasis obtained. The cavity was marked with clips. The breast is obtaining his tissue was closed with interrupted 30 Vicryls and the skin with subcuticular 5-0 Monocryl and Dermabond. Sponge needle in the stomach counts were correct.    Findings: As above   Estimated Blood Loss:  Minimal         Drains: None  Blood Given: none          Specimens: #1 right breast lumpectomy #2 further anterior margin        Complications:  * No complications entered in OR log *         Disposition: PACU - hemodynamically stable.         Condition: stable

## 2015-02-11 NOTE — H&P (Signed)
History of Present Illness Marland Kitchen T. Gaelen Brager MD; 12/28/2014 3:40 PM) Patient words: new breast cancer.  The patient is a 69 year old female who presents with breast cancer. Patint is a 53 post menopausal female referred by Dr. Abelardo Diesel for evaluation of recently diagnosed carcinoma of the right breast. She recently presented for a screening mamogram revealing a new area of pleomorphic calcifications in the right breast. She has a previous history of lumpectomy alone without radiation followed by hormonal therapy for DCIS of the right breast with surgery date 2005. Subsequent imaging included diagnostic mamogram showing an area of pleomorphic calcifications medial to the previous lumpectomy site measuring 1.8 x 2.8 x 2.8 cm.. A stereotactic guided breast biopsy was performed on December 21, 2014 with pathology revealing DCIS. She is seen now in the office for initial treatment planning. She has experienced no breast symptoms, specifically lump or pain or nipple discharge or skin changes. She does not have a personal history of treatment for DCIS of the right breast as above.  Findings at that time were the following: Tumor size: 2.8 cm Tumor grade: 2-3 Estrogen Receptor: Positive Progesterone Receptor: Positive     Other Problems Elbert Ewings, CMA; 12/28/2014 2:36 PM) Breast Cancer General anesthesia - complications Lump In Breast Thyroid Disease  Past Surgical History Elbert Ewings, CMA; 12/28/2014 2:36 PM) Appendectomy Oral Surgery Tonsillectomy  Diagnostic Studies History Elbert Ewings, CMA; 12/28/2014 2:36 PM) Colonoscopy within last year Mammogram within last year Pap Smear 1-5 years ago  Allergies Elbert Ewings, CMA; 12/28/2014 2:37 PM) Levofloxacin *CHEMICALS* Tetracycline *CHEMICALS*  Medication History Elbert Ewings, CMA; 12/28/2014 2:38 PM) Nitrofurantoin Monohyd Macro (100MG  Capsule, Oral) Active. Phenazopyridine HCl (200MG  Tablet, Oral)  Active. Sulfamethoxazole-Trimethoprim (800-160MG  Tablet, Oral) Active. Synthroid (137MCG Tablet, Oral) Active. Atrovent (0.03% Solution, Nasal) Active. Medications Reconciled  Social History Elbert Ewings, [place]; 12/28/2014 2:36 PM) Alcohol use Occasional alcohol use. Caffeine use Carbonated beverages, Coffee. No drug use Tobacco use Former smoker.  Family History Elbert Ewings, [place]; 12/28/2014 2:36 PM) Arthritis Father, Mother. Diabetes Mellitus Brother. Migraine Headache Mother. Respiratory Condition Father. Thyroid problems Brother.  Pregnancy / Birth History Elbert Ewings, CMA; 12/28/2014 2:36 PM) Age at menarche 78 years. Age of menopause 72-60 Gravida 0 Para 0  Review of Systems Elbert Ewings CMA; 12/28/2014 2:36 PM) General Not Present- Appetite Loss, Chills, Fatigue, Fever, Night Sweats, Weight Gain and Weight Loss. Skin Not Present- Change in Wart/Mole, Dryness, Hives, Jaundice, New Lesions, Non-Healing Wounds, Rash and Ulcer. HEENT Present- Seasonal Allergies and Wears glasses/contact lenses. Not Present- Earache, Hearing Loss, Hoarseness, Nose Bleed, Oral Ulcers, Ringing in the Ears, Sinus Pain, Sore Throat, Visual Disturbances and Yellow Eyes. Respiratory Not Present- Bloody sputum, Chronic Cough, Difficulty Breathing, Snoring and Wheezing. Breast Present- Breast Mass. Not Present- Breast Pain, Nipple Discharge and Skin Changes. Cardiovascular Not Present- Chest Pain, Difficulty Breathing Lying Down, Leg Cramps, Palpitations, Rapid Heart Rate, Shortness of Breath and Swelling of Extremities. Gastrointestinal Not Present- Abdominal Pain, Bloating, Bloody Stool, Change in Bowel Habits, Chronic diarrhea, Constipation, Difficulty Swallowing, Excessive gas, Gets full quickly at meals, Hemorrhoids, Indigestion, Nausea, Rectal Pain and Vomiting. Female Genitourinary Not Present- Frequency, Nocturia, Painful Urination, Pelvic Pain and Urgency. Musculoskeletal Present-  Joint Pain. Not Present- Back Pain, Joint Stiffness, Muscle Pain, Muscle Weakness and Swelling of Extremities. Neurological Not Present- Decreased Memory, Fainting, Headaches, Numbness, Seizures, Tingling, Tremor, Trouble walking and Weakness. Psychiatric Not Present- Anxiety, Bipolar, Change in Sleep Pattern, Depression, Fearful and Frequent crying. Endocrine Not Present- Cold Intolerance, Excessive  Hunger, Hair Changes, Heat Intolerance, Hot flashes and New Diabetes. Hematology Present- Easy Bruising. Not Present- Excessive bleeding, Gland problems, HIV and Persistent Infections.   Vitals Elbert Ewings CMA; 12/28/2014 2:38 PM) 12/28/2014 2:38 PM Weight: 252 lb Height: 64in Body Surface Area: 2.27 m Body Mass Index: 43.26 kg/m Temp.: 98.23F(Oral)  Pulse: 98 (Regular)  BP: 138/82 (Sitting, Left Arm, Standard)    Physical Exam Marland Kitchen T. Lelend Heinecke MD; 12/28/2014 3:41 PM) The physical exam findings are as follows: Note:General: Obese but otherwise well-appearing Caucasian female Skin: No rash or infection Lymph nodes: No cervical, supraclavicular or axillary nodes palpable Breasts: Healed lumpectomy site lateral right breast. No palpable masses in either breast. No nipple inversion or crusting or skin changes Lungs: Clear breath sounds bilaterally Cardiac: Regular rate and rhythm without murmurs Neurologic: Alert and fully oriented, affect normal. Gait normal.    Assessment & Plan Marland Kitchen T. Srihan Brutus MD; 12/28/2014 3:46 PM) BREAST NEOPLASM, TIS (DCIS), RIGHT (233.0  D05.11) Impression: 69 year old female with a new diagnosis of cancer of the right breast, upper outer quadrant. Clinical stage 0, ER positive, PR positive.. I discussed with the patient and family members present today initial surgical treatment options. We discussed options of breast conservation with lumpectomy or total mastectomy. As she has not had previous radiation therapy I think she would be a candidate  for breast conservation with lumpectomy. She would like to proceed with this plan. Particularly with her previous breast surgery I would like to obtain a bilateral breast MRI preoperatively. As this may be a second primary we will refer her for genetic counseling. She would like to see radiation and medical oncology preoperatively before making a final decision. . We discussed the indications and nature of the procedure, and expected recovery, in detail. Surgical risks including anesthetic complications, cardiorespiratory complications, bleeding, infection, wound healing complications, blood clots, lymphedema, local and distant recurrence and possible need for further surgery based on the final pathology was discussed and understood. Chemotherapy, hormonal therapy and radiation therapy have been discussed. They have been provided with literature regarding the treatment of breast cancer. I will review the above consultations and studies with her after they are completed and then likely plan to proceed with scheduling for wire bracketed right breast lumpectomy under general anesthesia as an outpatient. Current Plans  Referred to Radiation Oncology, for evaluation and follow up (Radiation Oncology). Referred to Oncology, for evaluation and follow up (Oncology). Referred to Genetic Counseling, for evaluation and follow up [employer]). MRI, BOTH BREASTS ([id number]) Instructed to make follow-up appointment for office visit following completion of diagnostic tests Pt Education - [facility] Breast Biopsy [facility]  Subsequently genetic testing was negative MRI showed: IMPRESSION: 1.1 cm area of non mass enhancement in the right 12:30 o'clock breast correspond to the site of biopsy-proven DCIS.  Plan to proceed with with wire bracketed lumpectomy after all above was reviewed with the patient  Edward Jolly MD, FACS  02/11/2015, 2:16 PM

## 2015-02-11 NOTE — Anesthesia Preprocedure Evaluation (Signed)
Anesthesia Evaluation  Patient identified by MRN, date of birth, ID band Patient awake    Reviewed: Allergy & Precautions, NPO status , Patient's Chart, lab work & pertinent test results  History of Anesthesia Complications (+) PONV and history of anesthetic complications  Airway Mallampati: II  TM Distance: >3 FB Neck ROM: Full    Dental no notable dental hx. (+) Teeth Intact, Dental Advisory Given   Pulmonary former smoker,  breath sounds clear to auscultation  Pulmonary exam normal       Cardiovascular Exercise Tolerance: Good - angina- Past MI negative cardio ROS Normal cardiovascular examRhythm:Regular Rate:Normal     Neuro/Psych negative neurological ROS     GI/Hepatic negative GI ROS, Neg liver ROS,   Endo/Other  Hypothyroidism Morbid obesity  Renal/GU negative Renal ROS     Musculoskeletal negative musculoskeletal ROS (+)   Abdominal   Peds  Hematology negative hematology ROS (+)   Anesthesia Other Findings Day of surgery medications reviewed with the patient.  Reproductive/Obstetrics                             Anesthesia Physical Anesthesia Plan  ASA: II  Anesthesia Plan: General   Post-op Pain Management:    Induction: Intravenous  Airway Management Planned: LMA  Additional Equipment:   Intra-op Plan:   Post-operative Plan: Extubation in OR  Informed Consent: I have reviewed the patients History and Physical, chart, labs and discussed the procedure including the risks, benefits and alternatives for the proposed anesthesia with the patient or authorized representative who has indicated his/her understanding and acceptance.   Dental advisory given  Plan Discussed with: CRNA  Anesthesia Plan Comments: (Risks/benefits of general anesthesia discussed with patient including risk of damage to teeth, lips, gum, and tongue, nausea/vomiting, allergic reactions to  medications, and the possibility of heart attack, stroke and death.  All patient questions answered.  Patient wishes to proceed.  HISTORY OF PONV--will do TIVA anesthetic.)        Anesthesia Quick Evaluation

## 2015-02-12 ENCOUNTER — Encounter (HOSPITAL_BASED_OUTPATIENT_CLINIC_OR_DEPARTMENT_OTHER): Payer: Self-pay | Admitting: General Surgery

## 2015-02-14 ENCOUNTER — Telehealth: Payer: Self-pay | Admitting: *Deleted

## 2015-02-14 NOTE — Telephone Encounter (Signed)
Per Dr. [name] let her know that "We are very pleased with surgical results!"   She was pleased.

## 2015-02-26 ENCOUNTER — Ambulatory Visit: Payer: No Typology Code available for payment source | Admitting: Radiation Oncology

## 2015-02-27 ENCOUNTER — Ambulatory Visit
Admission: RE | Admit: 2015-02-27 | Discharge: 2015-02-27 | Disposition: A | Discharge status: Home / Self Care | Payer: No Typology Code available for payment source | Source: Ambulatory Visit | Attending: Radiation Oncology | Admitting: Radiation Oncology

## 2015-02-27 DIAGNOSIS — C50911 Malignant neoplasm of unspecified site of right female breast: Secondary | ICD-10-CM

## 2015-02-27 DIAGNOSIS — Z510 Encounter for antineoplastic radiation therapy: Secondary | ICD-10-CM | POA: Diagnosis not present

## 2015-02-27 NOTE — Progress Notes (Signed)
Name: Elizabeth Ramirez   MRN: [id number]  Date:  02/27/2015  DOB: 1946/03/01  Status:outpatient    DIAGNOSIS: Breast cancer.  CONSENT VERIFIED: yes   SET UP: Patient is setup supine   IMMOBILIZATION:  The following immobilization was used:Custom Moldable Pillow, breast board.   NARRATIVE: Elizabeth Ramirez was brought to the [facility].  Identity was confirmed.  All relevant records and images related to the planned course of therapy were reviewed.  Then, the patient was positioned in a stable reproducible clinical set-up for radiation therapy.  Wires were placed to delineate the clinical extent of breast tissue. A wire was placed on the scar as well.  CT images were obtained.  An isocenter was placed. Skin markings were placed.  The CT images were loaded into the planning software where the target and avoidance structures were contoured.  The radiation prescription was entered and confirmed. The patient was discharged in stable condition and tolerated simulation well.    TREATMENT PLANNING NOTE:  Treatment planning then occurred. I have requested : MLC's, isodose plan, basic dose calculation  I personally designed and supervised the construction of 5 medically necessary complex treatment devices for the protection of critical normal structures including the lungs and contralateral breast as well as the immobilization device which is necessary for set up certainty.   3D simulation occurred. I requested and analyzed a dose volume histogram of the heart, lungs and lumpectomy cavity.   This document serves as a record of services personally performed by Thea Silversmith, MD. It was created on her behalf by Arlyce Harman, a trained medical scribe. The creation of this record is based on the scribe's personal observations and the provider's statements to them. This document has been checked and approved by the attending provider.     ------------------------------------------------  Thea Silversmith, MD

## 2015-03-05 ENCOUNTER — Encounter: Payer: Self-pay | Admitting: Radiation Oncology

## 2015-03-05 DIAGNOSIS — Z510 Encounter for antineoplastic radiation therapy: Secondary | ICD-10-CM | POA: Diagnosis not present

## 2015-03-06 ENCOUNTER — Ambulatory Visit
Admission: RE | Admit: 2015-03-06 | Discharge: 2015-03-06 | Disposition: A | Discharge status: Home / Self Care | Payer: No Typology Code available for payment source | Source: Ambulatory Visit | Attending: Radiation Oncology | Admitting: Radiation Oncology

## 2015-03-06 DIAGNOSIS — Z510 Encounter for antineoplastic radiation therapy: Secondary | ICD-10-CM | POA: Diagnosis not present

## 2015-03-07 ENCOUNTER — Ambulatory Visit
Admission: RE | Admit: 2015-03-07 | Discharge: 2015-03-07 | Disposition: A | Discharge status: Home / Self Care | Payer: No Typology Code available for payment source | Source: Ambulatory Visit | Attending: Radiation Oncology | Admitting: Radiation Oncology

## 2015-03-07 DIAGNOSIS — C50911 Malignant neoplasm of unspecified site of right female breast: Secondary | ICD-10-CM

## 2015-03-07 DIAGNOSIS — Z510 Encounter for antineoplastic radiation therapy: Secondary | ICD-10-CM | POA: Diagnosis not present

## 2015-03-07 MED ORDER — RADIAPLEXRX EX GEL
Freq: Once | CUTANEOUS | Status: AC
  Administered 2015-03-07: 18:00:00 via TOPICAL

## 2015-03-07 MED ORDER — ALRA NON-METALLIC DEODORANT (RAD-ONC)
1.0000 "application " | Freq: Once | TOPICAL | Status: AC
  Administered 2015-03-07: 1 via TOPICAL

## 2015-03-07 NOTE — Progress Notes (Signed)
Pt education done, radiation therapy and you book, alra and radaipelx gel given to patient, flyer on skin products and Val Malloy's RN business card, discussed qways to manage side effects,fatigue, skin irritation,.pain, swelling tenderness breat,increase protein in diet, stay hydrated, drink plenty fluids water,  Verbal understanding,teach back given 5:36 PM

## 2015-03-08 ENCOUNTER — Ambulatory Visit
Admission: RE | Admit: 2015-03-08 | Discharge: 2015-03-08 | Disposition: A | Discharge status: Home / Self Care | Payer: No Typology Code available for payment source | Source: Ambulatory Visit | Attending: Radiation Oncology | Admitting: Radiation Oncology

## 2015-03-08 DIAGNOSIS — Z510 Encounter for antineoplastic radiation therapy: Secondary | ICD-10-CM | POA: Diagnosis not present

## 2015-03-08 NOTE — Progress Notes (Signed)
Patient came to nursing stating  She had a rash under her right axilla and on side, she does sweat a lot, front of right breast no skin changes, looks like yeast, gave miconozole powder and telfa and  Mesh tube top to secure, will check again on Tuesday to see if this helps 2:05 PM

## 2015-03-11 ENCOUNTER — Ambulatory Visit
Admission: RE | Admit: 2015-03-11 | Discharge: 2015-03-11 | Disposition: A | Discharge status: Home / Self Care | Payer: No Typology Code available for payment source | Source: Ambulatory Visit | Attending: Radiation Oncology | Admitting: Radiation Oncology

## 2015-03-11 DIAGNOSIS — Z510 Encounter for antineoplastic radiation therapy: Secondary | ICD-10-CM | POA: Diagnosis not present

## 2015-03-12 ENCOUNTER — Encounter: Payer: Self-pay | Admitting: Radiation Oncology

## 2015-03-12 ENCOUNTER — Ambulatory Visit
Admission: RE | Admit: 2015-03-12 | Discharge: 2015-03-12 | Disposition: A | Discharge status: Home / Self Care | Payer: No Typology Code available for payment source | Source: Ambulatory Visit | Attending: Radiation Oncology | Admitting: Radiation Oncology

## 2015-03-12 VITALS — BP 145/64 | HR 78 | Temp 98.1°F | Resp 16 | Wt 253.5 lb

## 2015-03-12 DIAGNOSIS — C50911 Malignant neoplasm of unspecified site of right female breast: Secondary | ICD-10-CM

## 2015-03-12 DIAGNOSIS — Z510 Encounter for antineoplastic radiation therapy: Secondary | ICD-10-CM | POA: Diagnosis not present

## 2015-03-12 NOTE — Progress Notes (Signed)
Weekly Management Note Current Dose: 10  Gy  Projected Dose: 50 Gy   Narrative:  The patient presents for routine under treatment assessment.  CBCT/MVCT images/Port film x-rays were reviewed.  The chart was checked. Breast better with nystatin powder. Would like to use own antiperspirant.   Physical Findings: Weight: 253 lb 8 oz (114.987 kg). Unchanged  Impression:  The patient is tolerating radiation.  Plan:  Continue treatment as planned. OK to use own. Wipe prior to treatment. Continue radiaplex.

## 2015-03-12 NOTE — Progress Notes (Signed)
Rad txs right breat 4/20 txs completed, rash under axilla/side of breast,had given miconazole powder Friday, looks improved but still there, dry, patient sweats a lot ,stopped alra didn't help,, using radiaplex bid elsewhere on breast,occasional twinges in breast BP 145/64 mmHg  Pulse 78  Temp(Src) 98.1 F (36.7 C) (Oral)  Resp 16  Wt 253 lb 8 oz (114.987 kg)  Wt Readings from Last 3 Encounters:  03/12/15 253 lb 8 oz (114.987 kg)  02/11/15 248 lb (112.492 kg)  01/23/15 254 lb 11.2 oz (115.531 kg)

## 2015-03-13 ENCOUNTER — Ambulatory Visit
Admission: RE | Admit: 2015-03-13 | Discharge: 2015-03-13 | Disposition: A | Discharge status: Home / Self Care | Payer: No Typology Code available for payment source | Source: Ambulatory Visit | Attending: Radiation Oncology | Admitting: Radiation Oncology

## 2015-03-13 DIAGNOSIS — Z510 Encounter for antineoplastic radiation therapy: Secondary | ICD-10-CM | POA: Diagnosis not present

## 2015-03-14 ENCOUNTER — Ambulatory Visit
Admission: RE | Admit: 2015-03-14 | Discharge: 2015-03-14 | Disposition: A | Discharge status: Home / Self Care | Payer: No Typology Code available for payment source | Source: Ambulatory Visit | Attending: Radiation Oncology | Admitting: Radiation Oncology

## 2015-03-14 DIAGNOSIS — Z510 Encounter for antineoplastic radiation therapy: Secondary | ICD-10-CM | POA: Diagnosis not present

## 2015-03-15 ENCOUNTER — Ambulatory Visit
Admission: RE | Admit: 2015-03-15 | Discharge: 2015-03-15 | Disposition: A | Discharge status: Home / Self Care | Payer: No Typology Code available for payment source | Source: Ambulatory Visit | Attending: Radiation Oncology | Admitting: Radiation Oncology

## 2015-03-15 ENCOUNTER — Telehealth: Payer: Self-pay | Admitting: *Deleted

## 2015-03-15 DIAGNOSIS — Z510 Encounter for antineoplastic radiation therapy: Secondary | ICD-10-CM | POA: Diagnosis not present

## 2015-03-15 NOTE — Telephone Encounter (Signed)
Left vm for pt to return call regarding needs after 1st xrt. Contact information given.

## 2015-03-18 ENCOUNTER — Ambulatory Visit
Admission: RE | Admit: 2015-03-18 | Discharge: 2015-03-18 | Disposition: A | Discharge status: Home / Self Care | Payer: No Typology Code available for payment source | Source: Ambulatory Visit | Attending: Radiation Oncology | Admitting: Radiation Oncology

## 2015-03-18 ENCOUNTER — Ambulatory Visit: Payer: No Typology Code available for payment source

## 2015-03-18 DIAGNOSIS — Z510 Encounter for antineoplastic radiation therapy: Secondary | ICD-10-CM | POA: Diagnosis not present

## 2015-03-19 ENCOUNTER — Telehealth: Payer: Self-pay | Admitting: *Deleted

## 2015-03-19 ENCOUNTER — Encounter: Payer: Self-pay | Admitting: Radiation Oncology

## 2015-03-19 ENCOUNTER — Ambulatory Visit
Admission: RE | Admit: 2015-03-19 | Discharge: 2015-03-19 | Disposition: A | Discharge status: Home / Self Care | Payer: No Typology Code available for payment source | Source: Ambulatory Visit | Attending: Radiation Oncology | Admitting: Radiation Oncology

## 2015-03-19 ENCOUNTER — Ambulatory Visit: Payer: No Typology Code available for payment source

## 2015-03-19 VITALS — BP 111/71 | HR 74 | Resp 16 | Wt 255.3 lb

## 2015-03-19 DIAGNOSIS — Z510 Encounter for antineoplastic radiation therapy: Secondary | ICD-10-CM | POA: Diagnosis not present

## 2015-03-19 DIAGNOSIS — C50011 Malignant neoplasm of nipple and areola, right female breast: Secondary | ICD-10-CM | POA: Insufficient documentation

## 2015-03-19 MED ORDER — BIAFINE EX EMUL
Freq: Every day | CUTANEOUS | Status: DC
  Administered 2015-03-19: 17:00:00 via TOPICAL

## 2015-03-19 NOTE — Addendum Note (Signed)
Encounter addended by: Heywood Footman, RN on: 03/19/2015  4:32 PM<BR>     Documentation filed: [employer], Inpatient MAR, Orders

## 2015-03-19 NOTE — Telephone Encounter (Signed)
Spoke to pt concerning needs during xrt. Relate doing well and without complaints. Discussed survivorship program and referral after completion of xrt. Encourage pt to call with questions or needs. Received verbal understanding.

## 2015-03-19 NOTE — Progress Notes (Signed)
Weekly Management Note Current Dose: 22.5 Gy  Projected Dose: 50 Gy   Narrative:  The patient presents for routine under treatment assessment.  CBCT/MVCT images/Port film x-rays were reviewed.  The chart was checked. Reports using radiaplex bid as directed. Radiaplex is not reliving itching sensation. Feels like "bees are stinging me."  Physical Findings: Weight: 255 lb 4.8 oz (115.803 kg). No skin changes.  Impression:  The patient is tolerating radiation.  Plan:  Continue treatment as planned. The pt was given biafine to apply to the txt area.  This document serves as a record of services personally performed by Thea Silversmith, MD. It was created on her behalf by Darcus Austin, a trained medical scribe. The creation of this record is based on the scribe's personal observations and the provider's statements to them. This document has been checked and approved by the attending provider.

## 2015-03-19 NOTE — Progress Notes (Signed)
Weight and vitals stable. Denies pain. Denies fatigue. Heat rash at right mammary fold improving with meconizole powder. No skin changes noted within right breast treatment field. Reports using radiaplex bid as directed.   BP 111/71 mmHg  Pulse 74  Resp 16  Wt 255 lb 4.8 oz (115.803 kg) Wt Readings from Last 3 Encounters:  03/19/15 255 lb 4.8 oz (115.803 kg)  03/12/15 253 lb 8 oz (114.987 kg)  02/11/15 248 lb (112.492 kg)

## 2015-03-20 ENCOUNTER — Ambulatory Visit
Admission: RE | Admit: 2015-03-20 | Discharge: 2015-03-20 | Disposition: A | Discharge status: Home / Self Care | Payer: No Typology Code available for payment source | Source: Ambulatory Visit | Attending: Radiation Oncology | Admitting: Radiation Oncology

## 2015-03-20 ENCOUNTER — Ambulatory Visit: Payer: No Typology Code available for payment source

## 2015-03-20 DIAGNOSIS — Z510 Encounter for antineoplastic radiation therapy: Secondary | ICD-10-CM | POA: Diagnosis not present

## 2015-03-21 ENCOUNTER — Encounter: Payer: Self-pay | Admitting: Radiation Oncology

## 2015-03-21 ENCOUNTER — Ambulatory Visit: Payer: No Typology Code available for payment source

## 2015-03-21 ENCOUNTER — Ambulatory Visit
Admission: RE | Admit: 2015-03-21 | Discharge: 2015-03-21 | Disposition: A | Discharge status: Home / Self Care | Payer: No Typology Code available for payment source | Source: Ambulatory Visit | Attending: Radiation Oncology | Admitting: Radiation Oncology

## 2015-03-21 DIAGNOSIS — Z510 Encounter for antineoplastic radiation therapy: Secondary | ICD-10-CM | POA: Diagnosis not present

## 2015-03-22 ENCOUNTER — Other Ambulatory Visit: Payer: No Typology Code available for payment source

## 2015-03-22 ENCOUNTER — Encounter (HOSPITAL_COMMUNITY): Payer: Self-pay

## 2015-03-22 ENCOUNTER — Ambulatory Visit
Admission: RE | Admit: 2015-03-22 | Discharge: 2015-03-22 | Disposition: A | Discharge status: Home / Self Care | Payer: No Typology Code available for payment source | Source: Ambulatory Visit | Attending: Radiation Oncology | Admitting: Radiation Oncology

## 2015-03-22 ENCOUNTER — Ambulatory Visit: Payer: No Typology Code available for payment source

## 2015-03-22 DIAGNOSIS — Z510 Encounter for antineoplastic radiation therapy: Secondary | ICD-10-CM | POA: Diagnosis not present

## 2015-03-25 ENCOUNTER — Ambulatory Visit
Admission: RE | Admit: 2015-03-25 | Discharge: 2015-03-25 | Disposition: A | Discharge status: Home / Self Care | Payer: No Typology Code available for payment source | Source: Ambulatory Visit | Attending: Radiation Oncology | Admitting: Radiation Oncology

## 2015-03-25 DIAGNOSIS — Z510 Encounter for antineoplastic radiation therapy: Secondary | ICD-10-CM | POA: Diagnosis not present

## 2015-03-26 ENCOUNTER — Ambulatory Visit
Admission: RE | Admit: 2015-03-26 | Discharge: 2015-03-26 | Disposition: A | Discharge status: Home / Self Care | Payer: No Typology Code available for payment source | Source: Ambulatory Visit | Attending: Radiation Oncology | Admitting: Radiation Oncology

## 2015-03-26 ENCOUNTER — Encounter: Payer: Self-pay | Admitting: Radiation Oncology

## 2015-03-26 ENCOUNTER — Ambulatory Visit: Payer: No Typology Code available for payment source

## 2015-03-26 DIAGNOSIS — C50011 Malignant neoplasm of nipple and areola, right female breast: Secondary | ICD-10-CM

## 2015-03-26 DIAGNOSIS — Z510 Encounter for antineoplastic radiation therapy: Secondary | ICD-10-CM | POA: Diagnosis not present

## 2015-03-26 NOTE — Progress Notes (Signed)
Weekly Management Note Current Dose: 35  Gy  Projected Dose: 50 Gy   Narrative:  The patient presents for routine under treatment assessment.  CBCT/MVCT images/Port film x-rays were reviewed.  The chart was checked. Saw on tx machine for electron set up. Pt received letter from [employer] regarding a part of RT not being covered.   Physical Findings: Minimal pink skin. Some dermatitis in inframammary fold.   Impression:  The patient is tolerating radiation.  Plan:  Continue treatment as planned. Asked pt to fax in letter or bring to appt tomorrow.

## 2015-03-27 ENCOUNTER — Ambulatory Visit: Payer: No Typology Code available for payment source

## 2015-03-27 ENCOUNTER — Ambulatory Visit
Admission: RE | Admit: 2015-03-27 | Discharge: 2015-03-27 | Disposition: A | Discharge status: Home / Self Care | Payer: No Typology Code available for payment source | Source: Ambulatory Visit | Attending: Radiation Oncology | Admitting: Radiation Oncology

## 2015-03-28 ENCOUNTER — Ambulatory Visit
Admission: RE | Admit: 2015-03-28 | Discharge: 2015-03-28 | Disposition: A | Discharge status: Home / Self Care | Payer: No Typology Code available for payment source | Source: Ambulatory Visit | Attending: Radiation Oncology | Admitting: Radiation Oncology

## 2015-03-28 ENCOUNTER — Ambulatory Visit: Payer: No Typology Code available for payment source

## 2015-03-28 DIAGNOSIS — Z510 Encounter for antineoplastic radiation therapy: Secondary | ICD-10-CM | POA: Diagnosis not present

## 2015-03-29 ENCOUNTER — Ambulatory Visit
Admission: RE | Admit: 2015-03-29 | Discharge: 2015-03-29 | Disposition: A | Discharge status: Home / Self Care | Payer: No Typology Code available for payment source | Source: Ambulatory Visit | Attending: Oncology | Admitting: Oncology

## 2015-03-29 ENCOUNTER — Ambulatory Visit: Payer: No Typology Code available for payment source

## 2015-03-29 ENCOUNTER — Ambulatory Visit
Admission: RE | Admit: 2015-03-29 | Discharge: 2015-03-29 | Disposition: A | Discharge status: Home / Self Care | Payer: No Typology Code available for payment source | Source: Ambulatory Visit | Attending: Radiation Oncology | Admitting: Radiation Oncology

## 2015-03-29 DIAGNOSIS — C50911 Malignant neoplasm of unspecified site of right female breast: Secondary | ICD-10-CM

## 2015-03-29 DIAGNOSIS — Z510 Encounter for antineoplastic radiation therapy: Secondary | ICD-10-CM | POA: Diagnosis not present

## 2015-04-01 ENCOUNTER — Ambulatory Visit: Payer: No Typology Code available for payment source

## 2015-04-01 ENCOUNTER — Ambulatory Visit
Admission: RE | Admit: 2015-04-01 | Discharge: 2015-04-01 | Disposition: A | Discharge status: Home / Self Care | Payer: No Typology Code available for payment source | Source: Ambulatory Visit | Attending: Radiation Oncology | Admitting: Radiation Oncology

## 2015-04-01 DIAGNOSIS — Z510 Encounter for antineoplastic radiation therapy: Secondary | ICD-10-CM | POA: Diagnosis not present

## 2015-04-02 ENCOUNTER — Ambulatory Visit
Admission: RE | Admit: 2015-04-02 | Discharge: 2015-04-02 | Disposition: A | Discharge status: Home / Self Care | Payer: No Typology Code available for payment source | Source: Ambulatory Visit | Attending: Radiation Oncology | Admitting: Radiation Oncology

## 2015-04-02 ENCOUNTER — Encounter: Payer: Self-pay | Admitting: Radiation Oncology

## 2015-04-02 ENCOUNTER — Ambulatory Visit: Payer: No Typology Code available for payment source

## 2015-04-02 VITALS — BP 110/60 | HR 81 | Temp 98.0°F | Resp 18 | Wt 253.3 lb

## 2015-04-02 DIAGNOSIS — Z510 Encounter for antineoplastic radiation therapy: Secondary | ICD-10-CM | POA: Diagnosis not present

## 2015-04-02 DIAGNOSIS — C50011 Malignant neoplasm of nipple and areola, right female breast: Secondary | ICD-10-CM

## 2015-04-02 NOTE — Progress Notes (Signed)
Weekly Management Note Current Dose: 45.5  Gy  Projected Dose: 50 Gy   Narrative:  The patient presents for routine under treatment assessment.  CBCT/MVCT images/Port film x-rays were reviewed.  The chart was checked. Less painful breast. Inframammary fold has healed well. Knee surgery scheduled for December.   Physical Findings: Minimal pink skin. Some dermatitis in inframammary fold.   Impression:  The patient is tolerating radiation.  Plan:  Continue treatment as planned. Continue radiaplex. Follow up in 1 month. Discussed FYNN/survivorship.

## 2015-04-02 NOTE — Progress Notes (Signed)
Weekly rad txs right breast 18/20 completed,mild erythema, under inframmary fold much improved, almost healed , feeling better than last week, gave another radiaplex gel, gave [facility] flyer and [employer] fit program and gave  Survivorship www. website to patient  Month f/u appt card given, no pain, appetite good,  Little tired getting better 1:24 PM BP 110/60 mmHg  Pulse 81  Temp(Src) 98 F (36.7 C) (Oral)  Resp 18  Wt 253 lb 4.8 oz (114.896 kg)  Wt Readings from Last 3 Encounters:  04/02/15 253 lb 4.8 oz (114.896 kg)  03/19/15 255 lb 4.8 oz (115.803 kg)  03/12/15 253 lb 8 oz (114.987 kg)

## 2015-04-03 ENCOUNTER — Ambulatory Visit
Admission: RE | Admit: 2015-04-03 | Discharge: 2015-04-03 | Disposition: A | Discharge status: Home / Self Care | Payer: No Typology Code available for payment source | Source: Ambulatory Visit | Attending: Radiation Oncology | Admitting: Radiation Oncology

## 2015-04-03 ENCOUNTER — Ambulatory Visit: Payer: No Typology Code available for payment source

## 2015-04-03 DIAGNOSIS — Z510 Encounter for antineoplastic radiation therapy: Secondary | ICD-10-CM | POA: Diagnosis not present

## 2015-04-04 ENCOUNTER — Telehealth: Payer: Self-pay | Admitting: *Deleted

## 2015-04-04 ENCOUNTER — Encounter: Payer: Self-pay | Admitting: Radiation Oncology

## 2015-04-04 ENCOUNTER — Ambulatory Visit: Payer: No Typology Code available for payment source

## 2015-04-04 ENCOUNTER — Other Ambulatory Visit: Payer: Self-pay | Admitting: Adult Health

## 2015-04-04 ENCOUNTER — Ambulatory Visit
Admission: RE | Admit: 2015-04-04 | Discharge: 2015-04-04 | Disposition: A | Discharge status: Home / Self Care | Payer: No Typology Code available for payment source | Source: Ambulatory Visit | Attending: Radiation Oncology | Admitting: Radiation Oncology

## 2015-04-04 DIAGNOSIS — Z510 Encounter for antineoplastic radiation therapy: Secondary | ICD-10-CM | POA: Diagnosis not present

## 2015-04-04 DIAGNOSIS — C50911 Malignant neoplasm of unspecified site of right female breast: Secondary | ICD-10-CM

## 2015-04-04 NOTE — Telephone Encounter (Signed)
Called pt to congratulate on completion of xrt. Relate doing well and without complaints. Discussed survivorship care plan referral.  Encourage pt to call with needs or questions. Received verbal understanding.

## 2015-04-05 ENCOUNTER — Ambulatory Visit: Payer: No Typology Code available for payment source

## 2015-04-08 ENCOUNTER — Ambulatory Visit: Payer: No Typology Code available for payment source

## 2015-04-09 ENCOUNTER — Ambulatory Visit: Payer: No Typology Code available for payment source

## 2015-04-11 NOTE — Progress Notes (Signed)
  Radiation Oncology         [phone] ________________________________  Name: Elizabeth Ramirez MRN: [id number]  Date: 04/04/2015  DOB: Aug 21, 1945  End of Treatment Note  Diagnosis:  Breast cancer, right breast ([place])   Staging form: Breast, AJCC 7th Edition     Clinical: Stage 0 (Tis (DCIS), N0, M0) - Signed by Chauncey Cruel, MD on 01/14/2015    Indication for treatment:  Curative    Radiation treatment dates:   03/07/2015-04/04/2015  Site/dose:   Right breast/ 42.5 Gy at 2.5 Gy per fraction x 17 fractions.  Right breast boost/ 7.5 Gy at 2.5 Gy per fraction x 3 fractions  Beams/energy:  Opposed tangents with reduced fields / 10 and 15 MV photons Enface electrons / 18 MeV  Narrative: The patient tolerated radiation treatment relatively well.   She had some dermatitis that was painful for her. She was able to continue working.   Plan: The patient has completed radiation treatment. The patient will return to radiation oncology clinic for routine followup in one month. I advised them to call or return sooner if they have any questions or concerns related to their recovery or treatment.  ------------------------------------------------  Thea Silversmith, MD

## 2015-04-17 NOTE — Progress Notes (Signed)
Name: Elizabeth Ramirez   MRN: [id number]  Date:  03/20/15  DOB: 1946-02-26  Status:outpatient    DIAGNOSIS: Breast cancer, right breast ([place])   Staging form: Breast, AJCC 7th Edition     Clinical: Stage 0 (Tis (DCIS), N0, M0) - Signed by Chauncey Cruel, MD on 01/14/2015   CONSENT VERIFIED: yes   SET UP: Patient is setup supine   IMMOBILIZATION:  The following immobilization was used:Custom Moldable Pillow, breast board.   NARRATIVE: Elizabeth Ramirez underwent complex simulation and treatment planning for her boost treatment today.  Her tumor volume was outlined on the planning CT scan. The depth of her cavity was felt to be appropriate for treatment with electrons    18 MeV electrons will be prescribed to the 100%  isodose line.   I personally oversaw and approved the construction of a unique block which will be used for beam modification purposes.  An isodose plan is requested.

## 2015-04-17 NOTE — Progress Notes (Signed)
Radiation Oncology         [phone] ________________________________  Name: Elizabeth Ramirez      MRN: [id number]          Date: 02/27/15             DOB: 03/12/1946  Optical Surface Tracking Plan:  Since intensity modulated radiotherapy (IMRT) and 3D conformal radiation treatment methods are predicated on accurate and precise positioning for treatment, intrafraction motion monitoring is medically necessary to ensure accurate and safe treatment delivery.  The ability to quantify intrafraction motion without excessive ionizing radiation dose can only be performed with optical surface tracking. Accordingly, surface imaging offers the opportunity to obtain 3D measurements of patient position throughout IMRT and 3D treatments without excessive radiation exposure.  I am ordering optical surface tracking for this patient's upcoming course of radiotherapy. ________________________________ Signature   Reference:   Ursula Alert, J, et al. Surface imaging-based analysis of intrafraction motion for breast radiotherapy patients.Journal of [place], n. 6, nov. 2014. ISSN [id number].   Available at: <http://www.jacmp.org/index.php/jacmp/article/view/4957>.

## 2015-05-02 NOTE — Progress Notes (Signed)
   Department of Radiation Oncology  Phone:  [phone] Fax:        [phone]   Name: Elizabeth Ramirez MRN: [id number]  DOB: 10/29/1945  Date: 05/03/2015  Follow Up Visit Note  Diagnosis: Breast cancer, right breast ([place])   Staging form: Breast, AJCC 7th Edition     Clinical: Stage 0 (Tis (DCIS), N0, M0) - Signed by Chauncey Cruel, MD on 01/14/2015  Summary and Interval since last radiation: 42.5 Gy in 17 fractions plus boost to the right breast completed 04/04/15.  Interval History: Elizabeth Ramirez presents today for routine followup.  She is feeling well. She has good energy and is pleased with how her skin healed up. She is concerned about some firmness around her lumpectomy scar that she can now feel. She has not started anti estrogen treatment yet but has an appointment with Dr. [name] to discuss  Physical Exam:  There were no vitals filed for this visit. Well healed skin. Scar tissue palpable below the lumpectomy scar.   IMPRESSION: Elizabeth Ramirez is a 69 y.o. female with Stage 0 breast cancer in the right breast.  PLAN: She is doing well. We discussed the need for follow up every 4-6 months which she has scheduled.  We discussed the need for yearly mammograms which she can schedule with her OBGYN or with medical oncology. We discussed the need for sun protection in the treated area.  She can always call me with questions.  I will follow up with her on an as needed basis.   She will be referred to survivorship.   Thea Silversmith, MD

## 2015-05-03 ENCOUNTER — Ambulatory Visit
Admission: RE | Admit: 2015-05-03 | Discharge: 2015-05-03 | Disposition: A | Discharge status: Home / Self Care | Payer: No Typology Code available for payment source | Source: Ambulatory Visit | Attending: Radiation Oncology | Admitting: Radiation Oncology

## 2015-05-03 ENCOUNTER — Encounter: Payer: Self-pay | Admitting: Radiation Oncology

## 2015-05-03 VITALS — BP 136/80 | HR 88 | Temp 97.9°F | Resp 18 | Ht 64.0 in | Wt 260.7 lb

## 2015-05-03 DIAGNOSIS — C50011 Malignant neoplasm of nipple and areola, right female breast: Secondary | ICD-10-CM

## 2015-05-03 NOTE — Progress Notes (Signed)
Elizabeth Ramirez here for follow up.  She denies pain.  She reports her energy level is back to normal.  She is not taking anit-estrogen therapy yet.  She is asking for a refill on antifungal powder.  The skin on her right breast is pink.  She has an area of firmness about her right nipple.  She is using radiaplex.  Wt Readings from Last 3 Encounters:  05/03/15 260 lb 11.2 oz (118.253 kg)  04/02/15 253 lb 4.8 oz (114.896 kg)  03/19/15 255 lb 4.8 oz (115.803 kg)

## 2015-05-07 ENCOUNTER — Telehealth: Payer: Self-pay | Admitting: Oncology

## 2015-05-07 ENCOUNTER — Ambulatory Visit (HOSPITAL_BASED_OUTPATIENT_CLINIC_OR_DEPARTMENT_OTHER): Payer: No Typology Code available for payment source | Admitting: Oncology

## 2015-05-07 VITALS — BP 123/77 | HR 88 | Temp 97.7°F | Resp 18 | Ht 64.0 in | Wt 257.2 lb

## 2015-05-07 DIAGNOSIS — C50911 Malignant neoplasm of unspecified site of right female breast: Secondary | ICD-10-CM

## 2015-05-07 MED ORDER — ANASTROZOLE 1 MG PO TABS: 1.0000 mg | ORAL_TABLET | Freq: Every day | ORAL | Status: DC

## 2015-05-07 NOTE — Telephone Encounter (Signed)
Appointments made and avs printed for patient °

## 2015-05-07 NOTE — Progress Notes (Signed)
[facility]  [phone] [phone]     ID: Elizabeth Ramirez DOB: [birth year]  MR#: [id number]  WHQ#:[id number]  Patient Care Team: Cari Caraway, MD as PCP - General (Family Medicine) Chauncey Cruel, MD as Consulting Physician (Oncology) Excell Seltzer, MD as Consulting Physician (General Surgery) PCP: Cari Caraway, MD GYN:  Bobbye Charleston M.D. OTHER MD: Gaynelle Arabian MD,  Justice Britain M.D.  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 This was estrogen and progesterone receptor positive, HER-2 not amplified. Margins were ample. She participated in the MA-27 study and received anastrozole between June 2005 and June 2010, at which time she was released from follow-up.  More recently, Elizabeth Ramirez had screening bilateral mammography showing a suspicious area in the right breast and on 12/14/2014 underwent right diagnostic mammography at the breast Center. The breast density was category A. There was a group of pleomorphic calcifications in the upper central right breast spanning up to 2.8 cm. 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  The patient's case was presented at the multidisciplinary breast cancer conference 01/02/2015. At that time a preliminary plan was proposed, namely genetics counseling and if no mutation noted breast MRI to be followed by lumpectomy, radiation, and anti-estrogens.  Her subsequent history is as detailed below.  INTERVAL HISTORY: Dyna returns today for follow-up of her ductal carcinoma in situ. She underwent right lumpectomy  02/11/2015, showing (SZA [phone]) ductal carcinoma in situ, intermediate grade, with negative margins although the closest one remained at less than 0.1 cm. There was no invasive disease. Osie then proceeded to adjuvant radiation, which she completed in 04/04/2015. She is here now to discuss anti-estrogens.  Prior to this visit we obtained a bone density scan, which was normal.  REVIEW OF SYSTEMS: [name] did well with the radiation, with no desquamation, and with only mild fatigue. She continues to work right through the treatments. What bothers her is that she couldn't do the water aerobics because his skin was exposed. She then has significant problems with her knee, with newly replacement scheduled 05/27/2015 ([name]) so she can do a lot of walking either. This is very frustrating to her. She is experiencing some seasonal sinus issues and of course she has other aches and pains here in there which are not more persistent or intense than before. Aside from that a detailed review of systems today was noncontributory.  PAST MEDICAL HISTORY: Past Medical History  Diagnosis Date  . Thyroid disease   . Allergy     tetracycline, levofloxacin, crabs & bay leaves  . Cancer ([place])   . Breast cancer ([place]) 2016; 2005    Separate primaries; 2016 - DCIS of R breast; ER/PR+ April 2005  . Hypothyroidism   . Arthritis     Rt knee, Rt shoulder  . PONV (postoperative nausea and vomiting)     PAST SURGICAL HISTORY: Past Surgical History  Procedure Laterality Date  . Breast lumpectomy Right 09/2003    DCIS  . Appendectomy  08/23/2006  . Colonoscopy with biopsy  12/28/2003    neg  . Tonsillectomy      1954  . Wisdom  tooth extraction Bilateral 1965  . Breast lumpectomy with needle localization Right 02/11/2015    Procedure: WIRE BRACKETED RIGHT BREAST LUMPECTOMY ;  Surgeon: Excell Seltzer, MD;  Location: [facility];  Service: General;  Laterality: Right;    FAMILY HISTORY Family  History  Problem Relation Age of Onset  . Dementia Mother   . Other Mother 70    "spot on lung"; secondhand smoke exposure  . Pulmonary fibrosis Father   . Diabetes Brother   . Thyroid disease Brother   . Leukemia Brother 70    chronic lymphocytic   . Stomach cancer Maternal Grandmother     dx. 70s  . Heart attack Maternal Grandfather   . Diabetes Paternal Grandfather   . Stroke Paternal Grandfather   . Parkinson's disease Maternal Aunt   . Diabetes Paternal Aunt   . Other Paternal Aunt     bowel obstruction  . Heart Problems Paternal Uncle   . Ovarian cancer Maternal Aunt     dx. 47s; paternal half sister of her mother  . Multiple myeloma Maternal Aunt   . Multiple myeloma Maternal Aunt 75  . Kidney failure Paternal Aunt   . Pulmonary fibrosis Paternal Aunt   . Stroke Paternal Aunt   . Heart Problems Paternal Aunt   . Emphysema Paternal Uncle   . Heart Problems Paternal Uncle   . Lung cancer Paternal Uncle     smoker  . Colon cancer Cousin   . Breast cancer Cousin     dx. 43s   [facility] father died at the age of 70 from pulmonary fibrosis. Her mother died at age 70 with severe dementia. The patient had one brother no sisters. Her maternal grandmother died from stomach cancer the age of 70. The patient's mother had a half sister with ovarian cancer.  GYNECOLOGIC HISTORY:  No LMP recorded. Patient is postmenopausal.  menarche age 22, she isGX P0. Menopause 2003. Did not take hormone replacement  SOCIAL HISTORY:  Works as a Cabin crew, Publishing copy in renovations. At home it's her and Henriette Combs , who also is in real estate, with her own investment business.Katharine Look is a catholic, though not currently practicing    ADVANCED DIRECTIVES:  [name] is [employer] part turning   HEALTH MAINTENANCE: Social History  Substance Use Topics  . Smoking status: Former Smoker -- 1.00 packs/day for 9 years    Types: Cigarettes    Quit date: 06/16/1983  . Smokeless tobacco: Never  Used  . Alcohol Use: 4.2 oz/week    7 Glasses of wine per week     Comment: one glass w/ dinner     Colonoscopy:  20/15, [place] she knee  PAP: up-to-date/[facility]  Bone density: October 2016; normal  Lipid panel:  Allergies  Allergen Reactions  . Tetracyclines & Related Swelling    tongue swelling and peeling  . Levofloxacin Other (See Comments)    Affected tendons  . Other Other (See Comments)    Crabs and bay leaves  . Atrovent [Ipratropium]     Nose bleeds    Current Outpatient Prescriptions  Medication Sig Dispense Refill  . Calcium Carbonate-Vitamin D (CALTRATE 600+D PO) Take 1 tablet by mouth 2 (two) times daily.    . diphenhydrAMINE (BENADRYL ALLERGY) 25 MG tablet Take 25 mg by mouth every 4 (four) hours.    . Flaxseed, Linseed, (FLAX SEEDS PO) Take 1,200 mg by mouth.    . hyaluronate sodium (RADIAPLEXRX) GEL Apply 1 application topically 2 (two)  times daily.    Marland Kitchen ipratropium (ATROVENT) 0.03 % nasal spray Place 2 sprays into the nose every 12 (twelve) hours. (Patient not taking: Reported on 05/03/2015) 30 mL 0  . IPRATROPIUM BROMIDE NA Place into the nose.    . levothyroxine (SYNTHROID, LEVOTHROID) 137 MCG tablet Take 137 mcg by mouth daily before breakfast.    . loratadine-pseudoephedrine (CLARITIN-D 12-HOUR) 5-120 MG per tablet Take 1 tablet by mouth 2 (two) times daily.    Marland Kitchen Lysine HCl 1000 MG TABS Take by mouth.    . non-metallic deodorant Thornton Papas) MISC Apply 1 application topically daily as needed.    . pseudoephedrine-guaifenesin (MUCINEX D) 60-600 MG 12 hr tablet Take 1 tablet by mouth every 12 (twelve) hours.    . valACYclovir (VALTREX) 1000 MG tablet     . valACYclovir (VALTREX) 500 MG tablet Take 500 mg by mouth 2 (two) times daily.     No current facility-administered medications for this visit.    OBJECTIVE:  Middle-aged white woman who appears stated age 21 Vitals:   05/07/15 1339  BP: 123/77  Pulse: 88  Temp: 97.7 F (36.5 C)  Resp: 18     Body  mass index is 44.13 kg/(m^2).    ECOG FS:1 - Symptomatic but completely ambulatory  Sclerae unicteric, pupils round and equal Oropharynx clear and moist-- no thrush or other lesions No cervical or supraclavicular adenopathy Lungs no rales or rhonchi Heart regular rate and rhythm Abd soft, obese, nontender, positive bowel sounds MSK no focal spinal tenderness, no upper extremity lymphedema Neuro: nonfocal, well oriented, appropriate affect Breasts: the right breast is status post lumpectomy and radiation. There is no nipple inversion. There is a little bit of induration just below the scar, which is periareolar. There is no swelling or erythema. The overall cosmetic result is good. The right axilla is benign. The left breast is unremarkable   LAB RESULTS:  CMP     Component Value Date/Time   NA 140 12/04/2008 1133   K 4.7 12/04/2008 1133   CL 105 12/04/2008 1133   CO2 25 12/04/2008 1133   GLUCOSE 93 12/04/2008 1133   BUN 15 12/04/2008 1133   CREATININE 0.98 12/04/2008 1133   CALCIUM 9.4 12/04/2008 1133   PROT 6.6 12/04/2008 1133   ALBUMIN 4.2 12/04/2008 1133   AST 18 12/04/2008 1133   ALT 18 12/04/2008 1133   ALKPHOS 79 12/04/2008 1133   BILITOT 1.0 12/04/2008 1133    INo results found for: SPEP, UPEP  Lab Results  Component Value Date   WBC 6.1 12/04/2008   NEUTROABS 4.2 12/04/2008   HGB 14.7 12/04/2008   HCT 40.9 12/04/2008   MCV 89.2 12/04/2008   PLT 200 12/04/2008      Chemistry      Component Value Date/Time   NA 140 12/04/2008 1133   K 4.7 12/04/2008 1133   CL 105 12/04/2008 1133   CO2 25 12/04/2008 1133   BUN 15 12/04/2008 1133   CREATININE 0.98 12/04/2008 1133      Component Value Date/Time   CALCIUM 9.4 12/04/2008 1133   ALKPHOS 79 12/04/2008 1133   AST 18 12/04/2008 1133   ALT 18 12/04/2008 1133   BILITOT 1.0 12/04/2008 1133       Lab Results  Component Value Date   LABCA2 22 12/04/2008    No components found for: [id number]  No results  for input(s): INR in the last 168 hours.  Urinalysis    Component Value Date/Time  BILIRUBINUR neg 12/19/2014 1427   PROTEINUR 30 12/19/2014 1427   UROBILINOGEN 0.2 12/19/2014 1427   NITRITE neg 12/19/2014 1427   LEUKOCYTESUR large (3+)* 12/19/2014 1427    STUDIES: No results found.  ASSESSMENT: 69 y.o. [place] woman  (1) status post right lumpectomy April 2005 for a pT1a pN0, stage IA invasive tubular breast cancer, estrogen and progesterone receptor positive, HER-2 negative, in the setting of DCIS;   (a) did not receive radiation  (b) status post anastrozole for 5 years completed June 2010  (2) status post right breast upper outer quadrant biopsy 12/21/2014 for ductal carcinoma in situ, grade 2 or 3, estrogen and progesterone receptor positive.  (3) Status post right lumpectomy 02/11/2015 showing ductal carcinoma in situ, intermediate grade, measuring 0.9 cm, with close but negative margins  (4) adjuvant radiation: 03/07/2015-04/04/2015  Right breast/ 42.5 Gy at 2.5 Gy per fraction x 17 fractions.  Right breast boost/ 7.5 Gy at 2.5 Gy per fraction x 3 fractions  (5) genetic testing 01/14/2015 through the [facility] Panel offered by [facility] [name], MD) showed no deleterious mutations in ATM, BARD1, BRCA1, BRCA2, BRIP1, CDH1, CHEK2, FANCC, MLH1, MSH2, MSH6, NBN, PALB2, PMS2, PTEN, RAD51C, RAD51D, TP53, and XRCC2. This panel also includes deletion/duplication analysis (without sequencing) for one gene, EPCAM.  (6) to resume anastrozole 07/06/2014  (a) Bone density 03/29/2015 was normal, with the lowest T score at 0.4   PLAN: Charley tolerated anastrozole well before and she is hopeful to do equally well this time. We did discuss the possible toxicities, side effects and complications again in detail. She understands this will work to reduce the risk of local recurrence and also the risk of developing a new breast cancer in either  breast in the future.  Because of her upcoming right knee surgery 05/27/2015 we are going to wait until mid-January to start the anastrozole. The target date is 07/06/2014 (which is the date of her mother's death). She will let us know how she is doing and I will see her late March 2 troubleshoot any problems. She will have her mammography then in June and we will start seeing him yearly in July until she completes her 5 years of follow-up  She has a good understanding of this plan pressure agrees with it appears she knows the goal of treatment in her case is cure. She will call with any problems that may develop before her next visit here.  Chauncey Cruel, MD   05/07/2015 1:52 PM Medical Oncology and Hematology [facility] [address] [place], [place] [address] Tel. [phone]    Fax. [phone]

## 2015-05-07 NOTE — Telephone Encounter (Signed)
appointments.

## 2015-05-16 ENCOUNTER — Ambulatory Visit: Payer: Self-pay | Admitting: Orthopedic Surgery

## 2015-05-16 NOTE — Progress Notes (Signed)
Preoperative surgical orders have been place into the Epic hospital system for Elizabeth Ramirez on 05/16/2015, 11:54 AM  by Mickel Crow for surgery on 05-27-2015.  Preop Total Knee orders including Experal, IV Tylenol, and IV Decadron as long as there are no contraindications to the above medications. Arlee Muslim, PA-C

## 2015-05-20 ENCOUNTER — Encounter (HOSPITAL_COMMUNITY): Payer: Self-pay

## 2015-05-20 ENCOUNTER — Encounter (HOSPITAL_COMMUNITY)
Admission: RE | Admit: 2015-05-20 | Discharge: 2015-05-20 | Disposition: A | Discharge status: Home / Self Care | Payer: No Typology Code available for payment source | Source: Ambulatory Visit | Attending: Orthopedic Surgery | Admitting: Orthopedic Surgery

## 2015-05-20 DIAGNOSIS — Z01818 Encounter for other preprocedural examination: Secondary | ICD-10-CM | POA: Diagnosis present

## 2015-05-20 DIAGNOSIS — M179 Osteoarthritis of knee, unspecified: Secondary | ICD-10-CM | POA: Insufficient documentation

## 2015-05-20 HISTORY — DX: Other seasonal allergic rhinitis: J30.2

## 2015-05-20 HISTORY — DX: Personal history of urinary calculi: Z87.442

## 2015-05-20 LAB — COMPREHENSIVE METABOLIC PANEL
ALBUMIN: 4.1 g/dL (ref 3.5–5.0)
ALT: 24 U/L (ref 14–54)
ANION GAP: 7 (ref 5–15)
AST: 21 U/L (ref 15–41)
Alkaline Phosphatase: 82 U/L (ref 38–126)
BILIRUBIN TOTAL: 1.6 mg/dL — AB (ref 0.3–1.2)
BUN: 18 mg/dL (ref 6–20)
CO2: 28 mmol/L (ref 22–32)
Calcium: 9.2 mg/dL (ref 8.9–10.3)
Chloride: 104 mmol/L (ref 101–111)
Creatinine, Ser: 1 mg/dL (ref 0.44–1.00)
GFR calc non Af Amer: 56 mL/min — ABNORMAL LOW (ref >60)
GLUCOSE: 101 mg/dL — AB (ref 65–99)
POTASSIUM: 4.2 mmol/L (ref 3.5–5.1)
SODIUM: 139 mmol/L (ref 135–145)
TOTAL PROTEIN: 6.8 g/dL (ref 6.5–8.1)

## 2015-05-20 LAB — URINALYSIS, ROUTINE W REFLEX MICROSCOPIC
BILIRUBIN URINE: NEGATIVE
GLUCOSE, UA: NEGATIVE mg/dL
HGB URINE DIPSTICK: NEGATIVE
Ketones, ur: NEGATIVE mg/dL
Leukocytes, UA: NEGATIVE
Nitrite: NEGATIVE
PH: 6.5 (ref 5.0–8.0)
Protein, ur: NEGATIVE mg/dL
SPECIFIC GRAVITY, URINE: 1.009 (ref 1.005–1.030)

## 2015-05-20 LAB — CBC
HCT: 42.6 % (ref 36.0–46.0)
Hemoglobin: 14.5 g/dL (ref 12.0–15.0)
MCH: 30.4 pg (ref 26.0–34.0)
MCHC: 34 g/dL (ref 30.0–36.0)
MCV: 89.3 fL (ref 78.0–100.0)
Platelets: 179 10*3/uL (ref 150–400)
RBC: 4.77 MIL/uL (ref 3.87–5.11)
RDW: 13.8 % (ref 11.5–15.5)
WBC: 6.2 10*3/uL (ref 4.0–10.5)

## 2015-05-20 LAB — ABO/RH: ABO/RH(D): O NEG

## 2015-05-20 LAB — PROTIME-INR
INR: 0.94 (ref 0.00–1.49)
Prothrombin Time: 12.8 seconds (ref 11.6–15.2)

## 2015-05-20 LAB — APTT: APTT: 30 s (ref 24–37)

## 2015-05-20 LAB — SURGICAL PCR SCREEN
MRSA, PCR: NEGATIVE
Staphylococcus aureus: NEGATIVE

## 2015-05-20 NOTE — Patient Instructions (Addendum)
Elizabeth Ramirez  05/20/2015   Your procedure is scheduled on:   05-27-2015 Monday  Enter through [place] and follow signs to [facility] to [place]. Arrive at     0730   AM .  (Limit 1 person with you).  Call this number if you have problems the morning of surgery: [phone]  Or Presurgical Testing [phone] days before.   For Living Will and/or Health Care Power Attorney Forms: please provide copy for your medical record,may bring AM of surgery(Forms should be already notarized -we do not provide this service).(Yes, / information will be provided AM of surgery).     Do not eat food/ or drink: After Midnight.     Take these medicines the morning of surgery with A SIP OF WATER-   (DO NOT TAKE ANY DIABETIC MEDS AM OF SURGERY) : Levothyroxine.   Do not wear jewelry, make-up or nail polish.  Do not wear deodorant, lotions, powders, or perfumes.   Do not shave legs and under arms- 48 hours(2 days) prior to first CHG shower.(Shaving face and neck okay.)  Do not bring valuables to the hospital.(Hospital is not responsible for lost valuables).  Contacts, dentures or removable bridgework, body piercing, hair pins may not be worn into surgery.  Leave suitcase in the car. After surgery it may be brought to your room.  For patients admitted to the hospital, checkout time is 11:00 AM the day of discharge.(Restricted visitors-Any Persons displaying flu-like symptoms or illness).    Patients discharged the day of surgery will not be allowed to drive home. Must have responsible person with you x 24 hours once discharged.  Name and phone number of your driver: Estill Dooms [phone] cell     Please read over the following fact sheets that you were given:  CHG(Chlorhexidine Gluconate 4% Surgical Soap) use, MRSA Information, Blood Transfusion fact sheet, Incentive Spirometry Instruction.  Remember : Type/Screen "Blue armbands" - may not be  removed once applied(would result in being retested AM of surgery, if removed).         [facility] - Preparing for Surgery Before surgery, you can play an important role.  Because skin is not sterile, your skin needs to be as free of germs as possible.  You can reduce the number of germs on your skin by washing with CHG (chlorahexidine gluconate) soap before surgery.  CHG is an antiseptic cleaner which kills germs and bonds with the skin to continue killing germs even after washing. Please DO NOT use if you have an allergy to CHG or antibacterial soaps.  If your skin becomes reddened/irritated stop using the CHG and inform your nurse when you arrive at Short Stay. Do not shave (including legs and underarms) for at least 48 hours prior to the first CHG shower.  You may shave your face/neck. Please follow these instructions carefully:  1.  Shower with CHG Soap the night before surgery and the  morning of Surgery.  2.  If you choose to wash your hair, wash your hair first as usual with your  normal  shampoo.  3.  After you shampoo, rinse your hair and body thoroughly to remove the  shampoo.                           4.  Use CHG as you would any other liquid soap.  You can apply chg directly  to  the skin and wash                       Gently with a scrungie or clean washcloth.  5.  Apply the CHG Soap to your body ONLY FROM THE NECK DOWN.   Do not use on face/ open                           Wound or open sores. Avoid contact with eyes, ears mouth and genitals (private parts).                       Wash face,  Genitals (private parts) with your normal soap.             6.  Wash thoroughly, paying special attention to the area where your surgery  will be performed.  7.  Thoroughly rinse your body with warm water from the neck down.  8.  DO NOT shower/wash with your normal soap after using and rinsing off  the CHG Soap.                9.  Pat yourself dry with a clean towel.            10.  Wear clean  pajamas.            11.  Place clean sheets on your bed the night of your first shower and do not  sleep with pets. Day of Surgery : Do not apply any lotions/deodorants the morning of surgery.  Please wear clean clothes to the hospital/surgery center.  FAILURE TO FOLLOW THESE INSTRUCTIONS MAY RESULT IN THE CANCELLATION OF YOUR SURGERY PATIENT SIGNATURE_________________________________  NURSE SIGNATURE__________________________________  ________________________________________________________________________   Elizabeth Ramirez  An incentive spirometer is a tool that can help keep your lungs clear and active. This tool measures how well you are filling your lungs with each breath. Taking long deep breaths may help reverse or decrease the chance of developing breathing (pulmonary) problems (especially infection) following:  A long period of time when you are unable to move or be active. BEFORE THE PROCEDURE   If the spirometer includes an indicator to show your best effort, your nurse or respiratory therapist will set it to a desired goal.  If possible, sit up straight or lean slightly forward. Try not to slouch.  Hold the incentive spirometer in an upright position. INSTRUCTIONS FOR USE   Sit on the edge of your bed if possible, or sit up as far as you can in bed or on a chair.  Hold the incentive spirometer in an upright position.  Breathe out normally.  Place the mouthpiece in your mouth and seal your lips tightly around it.  Breathe in slowly and as deeply as possible, raising the piston or the ball toward the top of the column.  Hold your breath for 3-5 seconds or for as long as possible. Allow the piston or ball to fall to the bottom of the column.  Remove the mouthpiece from your mouth and breathe out normally.  Rest for a few seconds and repeat Steps 1 through 7 at least 10 times every 1-2 hours when you are awake. Take your time and take a few normal breaths between  deep breaths.  The spirometer may include an indicator to show your best effort. Use the indicator as a goal to work toward during each repetition.  After each  set of 10 deep breaths, practice coughing to be sure your lungs are clear. If you have an incision (the cut made at the time of surgery), support your incision when coughing by placing a pillow or rolled up towels firmly against it. Once you are able to get out of bed, walk around indoors and cough well. You may stop using the incentive spirometer when instructed by your caregiver.  RISKS AND COMPLICATIONS  Take your time so you do not get dizzy or light-headed.  If you are in pain, you may need to take or ask for pain medication before doing incentive spirometry. It is harder to take a deep breath if you are having pain. AFTER USE  Rest and breathe slowly and easily.  It can be helpful to keep track of a log of your progress. Your caregiver can provide you with a simple table to help with this. If you are using the spirometer at home, follow these instructions: [place] IF:   You are having difficultly using the spirometer.  You have trouble using the spirometer as often as instructed.  Your pain medication is not giving enough relief while using the spirometer.  You develop fever of 100.5 F (38.1 C) or higher. SEEK IMMEDIATE MEDICAL CARE IF:   You cough up bloody sputum that had not been present before.  You develop fever of 102 F (38.9 C) or greater.  You develop worsening pain at or near the incision site. MAKE SURE YOU:   Understand these instructions.  Will watch your condition.  Will get help right away if you are not doing well or get worse. Document Released: 10/12/2006 Document Revised: 08/24/2011 Document Reviewed: 12/13/2006 ExitCare Patient Information 2014 Exit[employer], [place].   ________________________________________________________________________  WHAT IS A BLOOD TRANSFUSION? Blood  Transfusion Information  A transfusion is the replacement of blood or some of its parts. Blood is made up of multiple cells which provide different functions.  Red blood cells carry oxygen and are used for blood loss replacement.  White blood cells fight against infection.  Platelets control bleeding.  Plasma helps clot blood.  Other blood products are available for specialized needs, such as hemophilia or other clotting disorders. BEFORE THE TRANSFUSION  Who gives blood for transfusions?   Healthy volunteers who are fully evaluated to make sure their blood is safe. This is blood bank blood. Transfusion therapy is the safest it has ever been in the practice of medicine. Before blood is taken from a donor, a complete history is taken to make sure that person has no history of diseases nor engages in risky social behavior (examples are intravenous drug use or sexual activity with multiple partners). The donor's travel history is screened to minimize risk of transmitting infections, such as malaria. The donated blood is tested for signs of infectious diseases, such as HIV and hepatitis. The blood is then tested to be sure it is compatible with you in order to minimize the chance of a transfusion reaction. If you or a relative donates blood, this is often done in anticipation of surgery and is not appropriate for emergency situations. It takes many days to process the donated blood. RISKS AND COMPLICATIONS Although transfusion therapy is very safe and saves many lives, the main dangers of transfusion include:   Getting an infectious disease.  Developing a transfusion reaction. This is an allergic reaction to something in the blood you were given. Every precaution is taken to prevent this. The decision to have a  blood transfusion has been considered carefully by your caregiver before blood is given. Blood is not given unless the benefits outweigh the risks. AFTER THE TRANSFUSION  Right after  receiving a blood transfusion, you will usually feel much better and more energetic. This is especially true if your red blood cells have gotten low (anemic). The transfusion raises the level of the red blood cells which carry oxygen, and this usually causes an energy increase.  The nurse administering the transfusion will monitor you carefully for complications. HOME CARE INSTRUCTIONS  No special instructions are needed after a transfusion. You may find your energy is better. Speak with your caregiver about any limitations on activity for underlying diseases you may have. SEEK MEDICAL CARE IF:   Your condition is not improving after your transfusion.  You develop redness or irritation at the intravenous (IV) site. SEEK IMMEDIATE MEDICAL CARE IF:  Any of the following symptoms occur over the next 12 hours:  Shaking chills.  You have a temperature by mouth above 102 F (38.9 C), not controlled by medicine.  Chest, back, or muscle pain.  People around you feel you are not acting correctly or are confused.  Shortness of breath or difficulty breathing.  Dizziness and fainting.  You get a rash or develop hives.  You have a decrease in urine output.  Your urine turns a dark color or changes to pink, red, or brown. Any of the following symptoms occur over the next 10 days:  You have a temperature by mouth above 102 F (38.9 C), not controlled by medicine.  Shortness of breath.  Weakness after normal activity.  The white part of the eye turns yellow (jaundice).  You have a decrease in the amount of urine or are urinating less often.  Your urine turns a dark color or changes to pink, red, or brown. Document Released: 05/29/2000 Document Revised: 08/24/2011 Document Reviewed: 01/16/2008 [facility] Patient Information 2014 [place], [place].  _______________________________________________________________________

## 2015-05-26 ENCOUNTER — Ambulatory Visit: Payer: Self-pay | Admitting: Orthopedic Surgery

## 2015-05-26 NOTE — H&P (Signed)
Elizabeth Ramirez DOB: 03/13/1946 Single / Language: Cleophus Molt / Race: White Female Date of Admission:  05/27/2015 CC:  Right Knee Pain History of Present Illness The patient is a 69 year old female who comes in for a preoperative History and Physical. The patient is scheduled for a right total knee arthroplasty to be performed by Dr. Dione Plover. [name], MD at [facility] on 05-27-2015. The patient is a 69 year old female who presented for follow up of their knee. The patient is being followed for their right osteoarthritis. They are month(s) out from cortisone injection adn previous gel series. Symptoms reported include: pain, aching, stiffness and pain with weightbearing. The patient feels that they are doing poorly and report their pain level to be severe. The following medication has been used for pain control: none. The patient has reported only temporay improvement of their symptoms with: Cortisone injections. She is known for having endstage arthritis in the lateral comparment of the right knee. She has tried cortisone with tempoarary benefit but has elected to proceed with surgery at this time. She states that it is difficult to stand up. She is not taking any medications for the knee at this time. Motrin gave her mouth ulcers and Aleve affected her kidney function. Tylenol has been no help. She has advanced arthritic changes in that knee. She also has some arthritis in the left, but no where near as bad. She has failed conservative measures and now wants to proceed with surgery. They have been treated conservatively in the past for the above stated problem and despite conservative measures, they continue to have progressive pain and severe functional limitations and dysfunction. They have failed non-operative management including home exercise, medications, and injections. It is felt that they would benefit from undergoing total joint replacement. Risks and benefits of the procedure have  been discussed with the patient and they elect to proceed with surgery. There are no active contraindications to surgery such as ongoing infection or rapidly progressive neurological disease.  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 Crab (Diagnostic) *DIAGNOSTIC PRODUCTS*   Family History Osteoporosis  mother Other medical problems  Father: Idiopathic Pulmonary Fibrosis Cancer  grandmother mothers side Cerebrovascular Accident  child Diabetes Mellitus  brother, grandfather mothers side and grandfather fathers side  Social History  Drug/Alcohol Rehab (Currently)  no Number of flights of stairs before winded  2-3 Illicit drug use  no Exercise  Exercises daily; does other and gym / weights Living situation  live with partner Tobacco / smoke exposure  no Most recent primary occupation  [employer] / Teacher, music In Diplomatic Services operational officer Tobacco use  Former smoker. Previously in rehab  no Current work status  working full time Children  0 Alcohol use  current drinker; drinks wine; 5-7 per week Marital status  partnered Pain Contract  no [place] to look into [place]  Medication History) Synthroid (137MCG Tablet, Oral daily) Active. Ipratropium Bromide (0.03% Solution, Nasal) Active.   Past Surgical History Tonsillectomy  Date: 89. Breast Mass; Local Excision  right lumpectomy - 2005 and 2016 Breast Biopsy  right Appendectomy  Date: 2008. Severly Impacteed Wisdom Teeth   Date: 24.  Review of Systems General Not Present- Chills, Fatigue, Fever, Memory Loss, Night Sweats, Weight Gain and Weight Loss. Skin Not Present- Eczema, Hives, Itching, Lesions and Rash. HEENT Not Present- Dentures, Double Vision, Headache, Hearing Loss, Tinnitus and Visual Loss. Respiratory Not Present- Allergies, Chronic Cough, Coughing up blood, Shortness of breath at rest and Shortness of breath with exertion. Cardiovascular Not Present- Chest Pain, Difficulty Breathing Lying Down, Murmur, Palpitations, Racing/skipping heartbeats and Swelling. Gastrointestinal Not Present- Abdominal Pain, Bloody Stool, Constipation, Diarrhea, Difficulty Swallowing, Heartburn, Jaundice, Loss of appetitie, Nausea and Vomiting. Female Genitourinary Not Present- Blood in Urine, Discharge, Flank Pain, Incontinence, Painful Urination, Urgency, Urinary frequency, Urinary Retention, Urinating at Night and Weak urinary stream. Musculoskeletal Present- Joint Pain. Not Present- Back Pain, Joint Swelling, Morning Stiffness, Muscle Pain, Muscle Weakness and Spasms. Neurological Not Present- Blackout spells, Difficulty with balance, Dizziness, Paralysis, Tremor and Weakness. Psychiatric Not Present- Insomnia.  Vitals Weight: 250 lb Height: 64in Body Surface Area: 2.15 m Body Mass Index: 42.91 kg/m  BP: 138/88 (Sitting, Left Arm, Standard)  Physical Exam General Mental Status -Alert, cooperative and good historian. General Appearance-pleasant, Not in acute distress. Orientation-Oriented X3. Build & Nutrition-Well nourished and Well developed.  Head and Neck Head-normocephalic, atraumatic . Neck Global Assessment - supple, no bruit auscultated on the right, no bruit auscultated on the left.  Eye Vision-Wears corrective lenses. Pupil - Bilateral-Regular and Round. Motion - Bilateral-EOMI.  Chest and Lung Exam Auscultation Breath sounds - clear at anterior chest wall and clear at  posterior chest wall. Adventitious sounds - No Adventitious sounds.  Cardiovascular Auscultation Rhythm - Regular rate and rhythm. Heart Sounds - S1 WNL and S2 WNL. Murmurs & Other Heart Sounds - Auscultation of the heart reveals - No Murmurs.  Abdomen Inspection Contour - Generalized moderate distention. Palpation/Percussion Tenderness - Abdomen is non-tender to palpation. Rigidity (guarding) - Abdomen is soft. Auscultation Auscultation of the abdomen reveals - Bowel sounds normal.  Female Genitourinary Note: Not done, not pertinent to present illness   Musculoskeletal Note: On exam, a well-developed female, in no distress. Right knee shows no swelling. There is marked crepitus with range of motion of the knee. She has diffuse tenderness. There is no instability.  Assessment & Plan  Primary osteoarthritis of right knee (M17.11)  Note:Surgical Plans: Right Total Knee Replacement  Disposition: Wants to look into [facility]  PCP: Dr. Theadore Nan  Topical TXA - Breast Cancer - Right sided - Avoid Right Arm for BPs and IVs  Anesthesia Issues: Severe nausea and vomiting postop in 2005 but did okay most recent surgery.  Signed electronically by Joelene Millin, III PA-C

## 2015-05-27 ENCOUNTER — Inpatient Hospital Stay (HOSPITAL_COMMUNITY): Payer: No Typology Code available for payment source | Admitting: Certified Registered Nurse Anesthetist

## 2015-05-27 ENCOUNTER — Encounter (HOSPITAL_COMMUNITY): Payer: Self-pay | Admitting: *Deleted

## 2015-05-27 ENCOUNTER — Encounter (HOSPITAL_COMMUNITY)
Admission: RE | Disposition: A | Discharge status: Skilled Nursing Facility | Payer: Self-pay | Source: Ambulatory Visit | Attending: Orthopedic Surgery

## 2015-05-27 ENCOUNTER — Inpatient Hospital Stay (HOSPITAL_COMMUNITY)
Admission: RE | Admit: 2015-05-27 | Discharge: 2015-05-29 | DRG: 470 | Disposition: A | Discharge status: Skilled Nursing Facility | Payer: No Typology Code available for payment source | Source: Ambulatory Visit | Attending: Orthopedic Surgery | Admitting: Orthopedic Surgery

## 2015-05-27 DIAGNOSIS — E039 Hypothyroidism, unspecified: Secondary | ICD-10-CM | POA: Diagnosis present

## 2015-05-27 DIAGNOSIS — C50911 Malignant neoplasm of unspecified site of right female breast: Secondary | ICD-10-CM | POA: Diagnosis present

## 2015-05-27 DIAGNOSIS — Z79899 Other long term (current) drug therapy: Secondary | ICD-10-CM | POA: Diagnosis not present

## 2015-05-27 DIAGNOSIS — M1710 Unilateral primary osteoarthritis, unspecified knee: Secondary | ICD-10-CM

## 2015-05-27 DIAGNOSIS — Z87891 Personal history of nicotine dependence: Secondary | ICD-10-CM | POA: Diagnosis not present

## 2015-05-27 DIAGNOSIS — Z6841 Body Mass Index (BMI) 40.0 and over, adult: Secondary | ICD-10-CM

## 2015-05-27 DIAGNOSIS — Z96659 Presence of unspecified artificial knee joint: Secondary | ICD-10-CM

## 2015-05-27 DIAGNOSIS — M1711 Unilateral primary osteoarthritis, right knee: Secondary | ICD-10-CM | POA: Diagnosis present

## 2015-05-27 DIAGNOSIS — M25561 Pain in right knee: Secondary | ICD-10-CM | POA: Diagnosis not present

## 2015-05-27 DIAGNOSIS — Z01812 Encounter for preprocedural laboratory examination: Secondary | ICD-10-CM

## 2015-05-27 DIAGNOSIS — M19011 Primary osteoarthritis, right shoulder: Secondary | ICD-10-CM | POA: Diagnosis present

## 2015-05-27 DIAGNOSIS — M179 Osteoarthritis of knee, unspecified: Secondary | ICD-10-CM

## 2015-05-27 DIAGNOSIS — E7800 Pure hypercholesterolemia, unspecified: Secondary | ICD-10-CM | POA: Diagnosis present

## 2015-05-27 HISTORY — DX: Presence of unspecified artificial knee joint: Z96.659

## 2015-05-27 HISTORY — PX: TOTAL KNEE ARTHROPLASTY: SHX125

## 2015-05-27 HISTORY — DX: Unilateral primary osteoarthritis, unspecified knee: M17.10

## 2015-05-27 HISTORY — DX: Osteoarthritis of knee, unspecified: M17.9

## 2015-05-27 LAB — TYPE AND SCREEN
ABO/RH(D): O NEG
ANTIBODY SCREEN: NEGATIVE

## 2015-05-27 SURGERY — ARTHROPLASTY, KNEE, TOTAL
Anesthesia: Spinal | Site: Knee | Laterality: Right

## 2015-05-27 MED ORDER — OXYCODONE HCL 5 MG PO TABS
5.0000 mg | ORAL_TABLET | ORAL | Status: DC | PRN
  Administered 2015-05-27 – 2015-05-28 (×7): 10 mg via ORAL
  Filled 2015-05-27 (×8): qty 2

## 2015-05-27 MED ORDER — FENTANYL CITRATE (PF) 100 MCG/2ML IJ SOLN
INTRAMUSCULAR | Status: DC | PRN
  Administered 2015-05-27: 50 ug via INTRAVENOUS
  Administered 2015-05-27 (×2): 25 ug via INTRAVENOUS

## 2015-05-27 MED ORDER — ONDANSETRON HCL 4 MG PO TABS: 4.0000 mg | ORAL_TABLET | Freq: Four times a day (QID) | ORAL | Status: DC | PRN

## 2015-05-27 MED ORDER — PROPOFOL 10 MG/ML IV BOLUS
INTRAVENOUS | Status: AC
  Filled 2015-05-27: qty 40

## 2015-05-27 MED ORDER — ACETAMINOPHEN 500 MG PO TABS
1000.0000 mg | ORAL_TABLET | Freq: Four times a day (QID) | ORAL | Status: AC
  Administered 2015-05-27 – 2015-05-28 (×4): 1000 mg via ORAL
  Filled 2015-05-27 (×4): qty 2

## 2015-05-27 MED ORDER — PROPOFOL 10 MG/ML IV BOLUS
INTRAVENOUS | Status: AC
  Filled 2015-05-27: qty 20

## 2015-05-27 MED ORDER — PSEUDOEPHEDRINE HCL 60 MG PO TABS
60.0000 mg | ORAL_TABLET | Freq: Every day | ORAL | Status: DC | PRN
  Filled 2015-05-27: qty 1

## 2015-05-27 MED ORDER — METHOCARBAMOL 1000 MG/10ML IJ SOLN
500.0000 mg | Freq: Four times a day (QID) | INTRAVENOUS | Status: DC | PRN
  Administered 2015-05-27 – 2015-05-28 (×2): 500 mg via INTRAVENOUS
  Filled 2015-05-27 (×5): qty 5

## 2015-05-27 MED ORDER — PROMETHAZINE HCL 25 MG/ML IJ SOLN: 6.2500 mg | INTRAMUSCULAR | Status: DC | PRN

## 2015-05-27 MED ORDER — CEFAZOLIN SODIUM-DEXTROSE 2-3 GM-% IV SOLR
2.0000 g | Freq: Four times a day (QID) | INTRAVENOUS | Status: AC
  Administered 2015-05-27 (×2): 2 g via INTRAVENOUS
  Filled 2015-05-27 (×2): qty 50

## 2015-05-27 MED ORDER — LACTATED RINGERS IV SOLN
INTRAVENOUS | Status: DC | PRN
  Administered 2015-05-27 (×3): via INTRAVENOUS

## 2015-05-27 MED ORDER — EPHEDRINE SULFATE 50 MG/ML IJ SOLN: INTRAMUSCULAR | Status: DC | PRN

## 2015-05-27 MED ORDER — CEFAZOLIN SODIUM-DEXTROSE 2-3 GM-% IV SOLR
2.0000 g | INTRAVENOUS | Status: AC
  Administered 2015-05-27: 2 g via INTRAVENOUS

## 2015-05-27 MED ORDER — CHLORHEXIDINE GLUCONATE 4 % EX LIQD: 60.0000 mL | Freq: Once | CUTANEOUS | Status: DC

## 2015-05-27 MED ORDER — LACTATED RINGERS IV SOLN: INTRAVENOUS | Status: DC

## 2015-05-27 MED ORDER — DOCUSATE SODIUM 100 MG PO CAPS
100.0000 mg | ORAL_CAPSULE | Freq: Two times a day (BID) | ORAL | Status: DC
  Administered 2015-05-27 – 2015-05-29 (×5): 100 mg via ORAL

## 2015-05-27 MED ORDER — FENTANYL CITRATE (PF) 100 MCG/2ML IJ SOLN
INTRAMUSCULAR | Status: AC
  Filled 2015-05-27: qty 2

## 2015-05-27 MED ORDER — GUAIFENESIN ER 600 MG PO TB12: 600.0000 mg | ORAL_TABLET | Freq: Every day | ORAL | Status: DC | PRN

## 2015-05-27 MED ORDER — PROPOFOL 10 MG/ML IV BOLUS
INTRAVENOUS | Status: DC | PRN
  Administered 2015-05-27: 20 mg via INTRAVENOUS

## 2015-05-27 MED ORDER — METOCLOPRAMIDE HCL 10 MG PO TABS: 5.0000 mg | ORAL_TABLET | Freq: Three times a day (TID) | ORAL | Status: DC | PRN

## 2015-05-27 MED ORDER — SODIUM CHLORIDE 0.9 % IJ SOLN
INTRAMUSCULAR | Status: AC
  Filled 2015-05-27: qty 50

## 2015-05-27 MED ORDER — IPRATROPIUM BROMIDE 0.03 % NA SOLN
1.0000 | Freq: Every day | NASAL | Status: DC
  Administered 2015-05-29: 1 via NASAL
  Filled 2015-05-27: qty 30

## 2015-05-27 MED ORDER — CEFAZOLIN SODIUM-DEXTROSE 2-3 GM-% IV SOLR
INTRAVENOUS | Status: AC
  Filled 2015-05-27: qty 50

## 2015-05-27 MED ORDER — BUPIVACAINE IN DEXTROSE 0.75-8.25 % IT SOLN
INTRATHECAL | Status: DC | PRN
  Administered 2015-05-27: 1.8 mL via INTRATHECAL

## 2015-05-27 MED ORDER — ACETAMINOPHEN 10 MG/ML IV SOLN
INTRAVENOUS | Status: AC
  Filled 2015-05-27: qty 100

## 2015-05-27 MED ORDER — ONDANSETRON HCL 4 MG/2ML IJ SOLN
4.0000 mg | Freq: Four times a day (QID) | INTRAMUSCULAR | Status: DC | PRN
  Administered 2015-05-27 – 2015-05-28 (×4): 4 mg via INTRAVENOUS
  Filled 2015-05-27 (×4): qty 2

## 2015-05-27 MED ORDER — BUPIVACAINE HCL (PF) 0.25 % IJ SOLN
INTRAMUSCULAR | Status: AC
  Filled 2015-05-27: qty 30

## 2015-05-27 MED ORDER — ONDANSETRON HCL 4 MG/2ML IJ SOLN
INTRAMUSCULAR | Status: DC | PRN
  Administered 2015-05-27: 4 mg via INTRAVENOUS

## 2015-05-27 MED ORDER — METHOCARBAMOL 500 MG PO TABS
500.0000 mg | ORAL_TABLET | Freq: Four times a day (QID) | ORAL | Status: DC | PRN
  Administered 2015-05-27 – 2015-05-29 (×4): 500 mg via ORAL
  Filled 2015-05-27 (×5): qty 1

## 2015-05-27 MED ORDER — FENTANYL CITRATE (PF) 100 MCG/2ML IJ SOLN: 25.0000 ug | INTRAMUSCULAR | Status: DC | PRN

## 2015-05-27 MED ORDER — BISACODYL 10 MG RE SUPP
10.0000 mg | Freq: Every day | RECTAL | Status: DC | PRN
  Administered 2015-05-28: 10 mg via RECTAL
  Filled 2015-05-27: qty 1

## 2015-05-27 MED ORDER — DEXAMETHASONE SODIUM PHOSPHATE 10 MG/ML IJ SOLN
10.0000 mg | Freq: Once | INTRAMUSCULAR | Status: AC
  Administered 2015-05-27: 10 mg via INTRAVENOUS

## 2015-05-27 MED ORDER — ONDANSETRON HCL 4 MG/2ML IJ SOLN
INTRAMUSCULAR | Status: AC
  Filled 2015-05-27: qty 2

## 2015-05-27 MED ORDER — SODIUM CHLORIDE 0.9 % IV SOLN
INTRAVENOUS | Status: DC
  Administered 2015-05-27 – 2015-05-28 (×2): via INTRAVENOUS

## 2015-05-27 MED ORDER — FLEET ENEMA 7-19 GM/118ML RE ENEM: 1.0000 | ENEMA | Freq: Once | RECTAL | Status: DC | PRN

## 2015-05-27 MED ORDER — SODIUM CHLORIDE 0.9 % IJ SOLN
INTRAMUSCULAR | Status: DC | PRN
  Administered 2015-05-27: 30 mL

## 2015-05-27 MED ORDER — DEXAMETHASONE SODIUM PHOSPHATE 10 MG/ML IJ SOLN
INTRAMUSCULAR | Status: AC
  Filled 2015-05-27: qty 1

## 2015-05-27 MED ORDER — MEPERIDINE HCL 50 MG/ML IJ SOLN
INTRAMUSCULAR | Status: AC
  Filled 2015-05-27: qty 1

## 2015-05-27 MED ORDER — PHENOL 1.4 % MT LIQD
1.0000 | OROMUCOSAL | Status: DC | PRN
  Filled 2015-05-27: qty 177

## 2015-05-27 MED ORDER — MIDAZOLAM HCL 5 MG/5ML IJ SOLN
INTRAMUSCULAR | Status: DC | PRN
  Administered 2015-05-27: 2 mg via INTRAVENOUS

## 2015-05-27 MED ORDER — BUPIVACAINE LIPOSOME 1.3 % IJ SUSP
INTRAMUSCULAR | Status: DC | PRN
  Administered 2015-05-27: 20 mL

## 2015-05-27 MED ORDER — SODIUM CHLORIDE 0.9 % IV BOLUS (SEPSIS)
250.0000 mL | Freq: Once | INTRAVENOUS | Status: AC
  Administered 2015-05-27: 250 mL via INTRAVENOUS

## 2015-05-27 MED ORDER — MORPHINE SULFATE (PF) 2 MG/ML IV SOLN
1.0000 mg | INTRAVENOUS | Status: DC | PRN
  Administered 2015-05-27 – 2015-05-28 (×10): 1 mg via INTRAVENOUS
  Filled 2015-05-27 (×12): qty 1

## 2015-05-27 MED ORDER — METOCLOPRAMIDE HCL 5 MG/ML IJ SOLN
5.0000 mg | Freq: Three times a day (TID) | INTRAMUSCULAR | Status: DC | PRN
  Administered 2015-05-27 – 2015-05-28 (×2): 10 mg via INTRAVENOUS
  Filled 2015-05-27 (×2): qty 2

## 2015-05-27 MED ORDER — RIVAROXABAN 10 MG PO TABS
10.0000 mg | ORAL_TABLET | Freq: Every day | ORAL | Status: DC
  Administered 2015-05-28 – 2015-05-29 (×2): 10 mg via ORAL
  Filled 2015-05-27 (×3): qty 1

## 2015-05-27 MED ORDER — MENTHOL 3 MG MT LOZG: 1.0000 | LOZENGE | OROMUCOSAL | Status: DC | PRN

## 2015-05-27 MED ORDER — BUPIVACAINE LIPOSOME 1.3 % IJ SUSP
20.0000 mL | Freq: Once | INTRAMUSCULAR | Status: DC
  Filled 2015-05-27: qty 20

## 2015-05-27 MED ORDER — MEPERIDINE HCL 50 MG/ML IJ SOLN
6.2500 mg | INTRAMUSCULAR | Status: DC | PRN
  Administered 2015-05-27: 12.5 mg via INTRAVENOUS

## 2015-05-27 MED ORDER — ACETAMINOPHEN 650 MG RE SUPP: 650.0000 mg | Freq: Four times a day (QID) | RECTAL | Status: DC | PRN

## 2015-05-27 MED ORDER — PROPOFOL 500 MG/50ML IV EMUL
INTRAVENOUS | Status: DC | PRN
  Administered 2015-05-27: 25 ug/kg/min via INTRAVENOUS

## 2015-05-27 MED ORDER — PHENYLEPHRINE 40 MCG/ML (10ML) SYRINGE FOR IV PUSH (FOR BLOOD PRESSURE SUPPORT)
PREFILLED_SYRINGE | INTRAVENOUS | Status: AC
  Filled 2015-05-27: qty 10

## 2015-05-27 MED ORDER — LEVOTHYROXINE SODIUM 137 MCG PO TABS
137.0000 ug | ORAL_TABLET | Freq: Every day | ORAL | Status: DC
  Administered 2015-05-28 – 2015-05-29 (×2): 137 ug via ORAL
  Filled 2015-05-27 (×3): qty 1

## 2015-05-27 MED ORDER — TRANEXAMIC ACID 1000 MG/10ML IV SOLN
2000.0000 mg | Freq: Once | INTRAVENOUS | Status: DC
  Filled 2015-05-27: qty 20

## 2015-05-27 MED ORDER — MIDAZOLAM HCL 2 MG/2ML IJ SOLN
INTRAMUSCULAR | Status: AC
  Filled 2015-05-27: qty 2

## 2015-05-27 MED ORDER — ACETAMINOPHEN 10 MG/ML IV SOLN
1000.0000 mg | Freq: Once | INTRAVENOUS | Status: AC
  Administered 2015-05-27: 1000 mg via INTRAVENOUS
  Filled 2015-05-27: qty 100

## 2015-05-27 MED ORDER — BUPIVACAINE HCL 0.25 % IJ SOLN
INTRAMUSCULAR | Status: DC | PRN
  Administered 2015-05-27: 20 mL

## 2015-05-27 MED ORDER — SODIUM CHLORIDE 0.9 % IV SOLN: INTRAVENOUS | Status: DC

## 2015-05-27 MED ORDER — ACETAMINOPHEN 325 MG PO TABS
650.0000 mg | ORAL_TABLET | Freq: Four times a day (QID) | ORAL | Status: DC | PRN
  Administered 2015-05-28 – 2015-05-29 (×3): 650 mg via ORAL
  Filled 2015-05-27 (×3): qty 2

## 2015-05-27 MED ORDER — POLYETHYLENE GLYCOL 3350 17 G PO PACK: 17.0000 g | PACK | Freq: Every day | ORAL | Status: DC | PRN

## 2015-05-27 MED ORDER — PSEUDOEPHEDRINE-GUAIFENESIN ER 60-600 MG PO TB12: 1.0000 | ORAL_TABLET | Freq: Every day | ORAL | Status: DC | PRN

## 2015-05-27 MED ORDER — TRAMADOL HCL 50 MG PO TABS
50.0000 mg | ORAL_TABLET | Freq: Four times a day (QID) | ORAL | Status: DC | PRN
  Administered 2015-05-27 – 2015-05-29 (×6): 100 mg via ORAL
  Filled 2015-05-27 (×6): qty 2

## 2015-05-27 MED ORDER — TRANEXAMIC ACID 1000 MG/10ML IV SOLN
2000.0000 mg | INTRAVENOUS | Status: DC | PRN
  Administered 2015-05-27: 2000 mg via TOPICAL

## 2015-05-27 MED ORDER — DIPHENHYDRAMINE HCL 12.5 MG/5ML PO ELIX
12.5000 mg | ORAL_SOLUTION | ORAL | Status: DC | PRN
  Filled 2015-05-27: qty 10

## 2015-05-27 MED ORDER — PHENYLEPHRINE HCL 10 MG/ML IJ SOLN
INTRAMUSCULAR | Status: DC | PRN
  Administered 2015-05-27 (×4): 80 ug via INTRAVENOUS
  Administered 2015-05-27 (×3): 40 ug via INTRAVENOUS
  Administered 2015-05-27: 80 ug via INTRAVENOUS

## 2015-05-27 SURGICAL SUPPLY — 51 items
BAG DECANTER FOR FLEXI CONT (MISCELLANEOUS) ×3 IMPLANT
BAG ZIPLOCK 12X15 (MISCELLANEOUS) ×3 IMPLANT
BANDAGE ELASTIC 6 VELCRO ST LF (GAUZE/BANDAGES/DRESSINGS) ×3 IMPLANT
BLADE SAG 18X100X1.27 (BLADE) ×3 IMPLANT
BLADE SAW SGTL 11.0X1.19X90.0M (BLADE) ×3 IMPLANT
BOWL SMART MIX CTS (DISPOSABLE) ×3 IMPLANT
CAPT KNEE TOTAL 3 ATTUNE ×3 IMPLANT
CEMENT HV SMART SET (Cement) ×6 IMPLANT
CLOSURE WOUND 1/2 X4 (GAUZE/BANDAGES/DRESSINGS) ×1
CLOTH BEACON ORANGE TIMEOUT ST (SAFETY) ×3 IMPLANT
CUFF TOURN SGL QUICK 34 (TOURNIQUET CUFF) ×2
CUFF TRNQT CYL 34X4X40X1 (TOURNIQUET CUFF) ×1 IMPLANT
DECANTER SPIKE VIAL GLASS SM (MISCELLANEOUS) ×3 IMPLANT
DRAPE U-SHAPE 47X51 STRL (DRAPES) ×3 IMPLANT
DRSG ADAPTIC 3X8 NADH LF (GAUZE/BANDAGES/DRESSINGS) ×3 IMPLANT
DRSG PAD ABDOMINAL 8X10 ST (GAUZE/BANDAGES/DRESSINGS) ×3 IMPLANT
DURAPREP 26ML APPLICATOR (WOUND CARE) ×3 IMPLANT
ELECT REM PT RETURN 9FT ADLT (ELECTROSURGICAL) ×3
ELECTRODE REM PT RTRN 9FT ADLT (ELECTROSURGICAL) ×1 IMPLANT
EVACUATOR 1/8 PVC DRAIN (DRAIN) ×3 IMPLANT
GAUZE SPONGE 4X4 12PLY STRL (GAUZE/BANDAGES/DRESSINGS) ×3 IMPLANT
GLOVE BIO SURGEON STRL SZ7.5 (GLOVE) ×3 IMPLANT
GLOVE BIO SURGEON STRL SZ8 (GLOVE) ×3 IMPLANT
GLOVE BIOGEL PI IND STRL 6.5 (GLOVE) IMPLANT
GLOVE BIOGEL PI IND STRL 8 (GLOVE) ×2 IMPLANT
GLOVE BIOGEL PI INDICATOR 6.5 (GLOVE)
GLOVE BIOGEL PI INDICATOR 8 (GLOVE) ×4
GLOVE SURG SS PI 6.5 STRL IVOR (GLOVE) IMPLANT
GOWN STRL REUS W/TWL LRG LVL3 (GOWN DISPOSABLE) ×3 IMPLANT
GOWN STRL REUS W/TWL XL LVL3 (GOWN DISPOSABLE) ×3 IMPLANT
HANDPIECE INTERPULSE COAX TIP (DISPOSABLE) ×2
IMMOBILIZER KNEE 20 (SOFTGOODS) ×3
IMMOBILIZER KNEE 20 THIGH 36 (SOFTGOODS) ×1 IMPLANT
MANIFOLD NEPTUNE II (INSTRUMENTS) ×3 IMPLANT
NS IRRIG 1000ML POUR BTL (IV SOLUTION) ×3 IMPLANT
PACK TOTAL KNEE CUSTOM (KITS) ×3 IMPLANT
PAD ABD 7.5X8 STRL (GAUZE/BANDAGES/DRESSINGS) ×3 IMPLANT
PADDING CAST COTTON 6X4 STRL (CAST SUPPLIES) ×6 IMPLANT
POSITIONER SURGICAL ARM (MISCELLANEOUS) ×3 IMPLANT
SET HNDPC FAN SPRY TIP SCT (DISPOSABLE) ×1 IMPLANT
STRIP CLOSURE SKIN 1/2X4 (GAUZE/BANDAGES/DRESSINGS) ×2 IMPLANT
SUT MNCRL AB 4-0 PS2 18 (SUTURE) ×3 IMPLANT
SUT VIC AB 2-0 CT1 27 (SUTURE) ×6
SUT VIC AB 2-0 CT1 TAPERPNT 27 (SUTURE) ×3 IMPLANT
SUT VLOC 180 0 24IN GS25 (SUTURE) ×3 IMPLANT
SYR 50ML LL SCALE MARK (SYRINGE) ×3 IMPLANT
TRAY FOLEY W/METER SILVER 14FR (SET/KITS/TRAYS/PACK) ×3 IMPLANT
TRAY FOLEY W/METER SILVER 16FR (SET/KITS/TRAYS/PACK) IMPLANT
WATER STERILE IRR 1500ML POUR (IV SOLUTION) ×3 IMPLANT
WRAP KNEE MAXI GEL POST OP (GAUZE/BANDAGES/DRESSINGS) ×3 IMPLANT
YANKAUER SUCT BULB TIP 10FT TU (MISCELLANEOUS) ×3 IMPLANT

## 2015-05-27 NOTE — Transfer of Care (Deleted)
Immediate Anesthesia Transfer of Care Note  Patient: Elizabeth Ramirez  Procedure(s) Performed: Procedure(s): RIGHT TOTAL KNEE ARTHROPLASTY (Right)  Patient Location: PACU  Anesthesia Type:Spinal  Level of Consciousness:  alert, patient cooperative and responds to stimulation  Airway & Oxygen Therapy:Patient Spontanous Breathing and Patient connected to face mask oxgen  Post-op Assessment:  Report given to PACU RN and Post -op Vital signs reviewed and stable  Post vital signs:  Reviewed and stable, T12 spinal level  Last Vitals:  Filed Vitals:   05/27/15 0730  BP: 128/69  Pulse: 84  Temp: 36.6 C  Resp: 18    Complications: No apparent anesthesia complications

## 2015-05-27 NOTE — H&P (View-Only) (Signed)
Elizabeth Ramirez DOB: 01-02-1946 Single / Language: Cleophus Molt / Race: White Female Date of Admission:  05/27/2015 CC:  Right Knee Pain History of Present Illness The patient is a 69 year old female who comes in for a preoperative History and Physical. The patient is scheduled for a right total knee arthroplasty to be performed by Dr. Dione Plover. [name], MD at [facility] on 05-27-2015. The patient is a 69 year old female who presented for follow up of their knee. The patient is being followed for their right osteoarthritis. They are month(s) out from cortisone injection adn previous gel series. Symptoms reported include: pain, aching, stiffness and pain with weightbearing. The patient feels that they are doing poorly and report their pain level to be severe. The following medication has been used for pain control: none. The patient has reported only temporay improvement of their symptoms with: Cortisone injections. She is known for having endstage arthritis in the lateral comparment of the right knee. She has tried cortisone with tempoarary benefit but has elected to proceed with surgery at this time. She states that it is difficult to stand up. She is not taking any medications for the knee at this time. Motrin gave her mouth ulcers and Aleve affected her kidney function. Tylenol has been no help. She has advanced arthritic changes in that knee. She also has some arthritis in the left, but no where near as bad. She has failed conservative measures and now wants to proceed with surgery. They have been treated conservatively in the past for the above stated problem and despite conservative measures, they continue to have progressive pain and severe functional limitations and dysfunction. They have failed non-operative management including home exercise, medications, and injections. It is felt that they would benefit from undergoing total joint replacement. Risks and benefits of the procedure have  been discussed with the patient and they elect to proceed with surgery. There are no active contraindications to surgery such as ongoing infection or rapidly progressive neurological disease.  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 Crab (Diagnostic) *DIAGNOSTIC PRODUCTS*   Family History Osteoporosis  mother Other medical problems  Father: Idiopathic Pulmonary Fibrosis Cancer  grandmother mothers side Cerebrovascular Accident  child Diabetes Mellitus  brother, grandfather mothers side and grandfather fathers side  Social History  Drug/Alcohol Rehab (Currently)  no Number of flights of stairs before winded  2-3 Illicit drug use  no Exercise  Exercises daily; does other and gym / weights Living situation  live with partner Tobacco / smoke exposure  no Most recent primary occupation  [employer] / Teacher, music In Diplomatic Services operational officer Tobacco use  Former smoker. Previously in rehab  no Current work status  working full time Children  0 Alcohol use  current drinker; drinks wine; 5-7 per week Marital status  partnered Pain Contract  no [place] to look into [facility]  Medication History) Synthroid (137MCG Tablet, Oral daily) Active. Ipratropium Bromide (0.03% Solution, Nasal) Active.   Past Surgical History Tonsillectomy  Date: 17. Breast Mass; Local Excision  right lumpectomy - 2005 and 2016 Breast Biopsy  right Appendectomy  Date: 2008. Severly Impacteed Wisdom Teeth   Date: 39.  Review of Systems General Not Present- Chills, Fatigue, Fever, Memory Loss, Night Sweats, Weight Gain and Weight Loss. Skin Not Present- Eczema, Hives, Itching, Lesions and Rash. HEENT Not Present- Dentures, Double Vision, Headache, Hearing Loss, Tinnitus and Visual Loss. Respiratory Not Present- Allergies, Chronic Cough, Coughing up blood, Shortness of breath at rest and Shortness of breath with exertion. Cardiovascular Not Present- Chest Pain, Difficulty Breathing Lying Down, Murmur, Palpitations, Racing/skipping heartbeats and Swelling. Gastrointestinal Not Present- Abdominal Pain, Bloody Stool, Constipation, Diarrhea, Difficulty Swallowing, Heartburn, Jaundice, Loss of appetitie, Nausea and Vomiting. Female Genitourinary Not Present- Blood in Urine, Discharge, Flank Pain, Incontinence, Painful Urination, Urgency, Urinary frequency, Urinary Retention, Urinating at Night and Weak urinary stream. Musculoskeletal Present- Joint Pain. Not Present- Back Pain, Joint Swelling, Morning Stiffness, Muscle Pain, Muscle Weakness and Spasms. Neurological Not Present- Blackout spells, Difficulty with balance, Dizziness, Paralysis, Tremor and Weakness. Psychiatric Not Present- Insomnia.  Vitals Weight: 250 lb Height: 64in Body Surface Area: 2.15 m Body Mass Index: 42.91 kg/m  BP: 138/88 (Sitting, Left Arm, Standard)  Physical Exam General Mental Status -Alert, cooperative and good historian. General Appearance-pleasant, Not in acute distress. Orientation-Oriented X3. Build & Nutrition-Well nourished and Well developed.  Head and Neck Head-normocephalic, atraumatic . Neck Global Assessment - supple, no bruit auscultated on the right, no bruit auscultated on the left.  Eye Vision-Wears corrective lenses. Pupil - Bilateral-Regular and Round. Motion - Bilateral-EOMI.  Chest and Lung Exam Auscultation Breath sounds - clear at anterior chest wall and clear at  posterior chest wall. Adventitious sounds - No Adventitious sounds.  Cardiovascular Auscultation Rhythm - Regular rate and rhythm. Heart Sounds - S1 WNL and S2 WNL. Murmurs & Other Heart Sounds - Auscultation of the heart reveals - No Murmurs.  Abdomen Inspection Contour - Generalized moderate distention. Palpation/Percussion Tenderness - Abdomen is non-tender to palpation. Rigidity (guarding) - Abdomen is soft. Auscultation Auscultation of the abdomen reveals - Bowel sounds normal.  Female Genitourinary Note: Not done, not pertinent to present illness   Musculoskeletal Note: On exam, a well-developed female, in no distress. Right knee shows no swelling. There is marked crepitus with range of motion of the knee. She has diffuse tenderness. There is no instability.  Assessment & Plan  Primary osteoarthritis of right knee (M17.11)  Note:Surgical Plans: Right Total Knee Replacement  Disposition: Wants to look into [place]  PCP: Dr. Theadore Nan  Topical TXA - Breast Cancer - Right sided - Avoid Right Arm for BPs and IVs  Anesthesia Issues: Severe nausea and vomiting postop in 2005 but did okay most recent surgery.  Signed electronically by Joelene Millin, III PA-C

## 2015-05-27 NOTE — Anesthesia Postprocedure Evaluation (Signed)
Anesthesia Post Note  Patient: Elizabeth Ramirez  Procedure(s) Performed: Procedure(s) (LRB): RIGHT TOTAL KNEE ARTHROPLASTY (Right)  Patient location during evaluation: PACU Anesthesia Type: Spinal Level of consciousness: oriented and awake and alert Pain management: pain level controlled Vital Signs Assessment: post-procedure vital signs reviewed and stable Respiratory status: spontaneous breathing, respiratory function stable and patient connected to nasal cannula oxygen Cardiovascular status: blood pressure returned to baseline and stable Postop Assessment: no headache and no backache Anesthetic complications: no    Last Vitals:  Filed Vitals:   05/27/15 1300 05/27/15 1324  BP: 109/69 115/60  Pulse: 85 87  Temp: 36.4 C 36.4 C  Resp: 15 14    Last Pain:  Filed Vitals:   05/27/15 1326  PainSc: 0-No pain                 [name]

## 2015-05-27 NOTE — Interval H&P Note (Signed)
History and Physical Interval Note:  05/27/2015 9:21 AM  Elizabeth Ramirez  has presented today for surgery, with the diagnosis of right knee osteoarthritis  The various methods of treatment have been discussed with the patient and family. After consideration of risks, benefits and other options for treatment, the patient has consented to  Procedure(s): RIGHT TOTAL KNEE ARTHROPLASTY (Right) as a surgical intervention .  The patient's history has been reviewed, patient examined, no change in status, stable for surgery.  I have reviewed the patient's chart and labs.  Questions were answered to the patient's satisfaction.     Gearlean Alf

## 2015-05-27 NOTE — Anesthesia Procedure Notes (Signed)
Spinal Patient location during procedure: OR Start time: 05/27/2015 10:16 AM End time: 05/27/2015 10:21 AM Staffing Anesthesiologist: Suella Broad D Resident/CRNA: Darlys Gales R Performed by: resident/CRNA  Preanesthetic Checklist Completed: patient identified, site marked, surgical consent, pre-op evaluation, timeout performed, IV checked, risks and benefits discussed and monitors and equipment checked Spinal Block Patient position: sitting Prep: Betadine Patient monitoring: heart rate, continuous pulse ox and blood pressure Approach: midline Location: L3-4 Injection technique: single-shot Needle Needle type: Spinocan  Needle gauge: 22 G Needle length: 9 cm Needle insertion depth: 8 cm Assessment Sensory level: T6 Additional Notes Expiration date of kit checked and confirmed. Patient tolerated procedure well, without complications.

## 2015-05-27 NOTE — Evaluation (Signed)
Physical Therapy Evaluation Patient Details Name: Elizabeth Ramirez MRN: [id number] DOB: 25-Jan-1946 Today's Date: 05/27/2015   History of Present Illness  pt s/p R TKA   Clinical Impression  Pt is s/p TKA resulting in the deficits listed below (see PT Problem List). Pt will benefit from further PT to increase their independence and safety with mobility to allow discharge to the venue listed below.        Follow Up Recommendations Home health PT (pt stated to go home, however depending on progress may need SNF, but sounds like she has A set up for home, barrier steps)    Equipment Recommendations  Rolling walker with 5" wheels    Recommendations for Other Services       Precautions / Restrictions Precautions Precautions: Knee Required Braces or Orthoses: Knee Immobilizer - Right Knee Immobilizer - Right: Discontinue once straight leg raise with < 10 degree lag Restrictions Weight Bearing Restrictions: No      Mobility  Bed Mobility Overal bed mobility: Needs Assistance Bed Mobility: Rolling;Supine to Sit;Sit to Supine     Supine to sit: Mod assist;HOB elevated;+2 for physical assistance Sit to supine: Mod assist;HOB elevated;+2 for physical assistance   General bed mobility comments: upper body and assist with R LE for pain control and ability.   Transfers Overall transfer level: Needs assistance Equipment used: Rolling walker (2 wheeled) Transfers: Sit to/from Stand Sit to Stand: Min assist         General transfer comment: needed cues for uses of UE for support and limited with push off due to orthopedic issues with R shoulder as well.   Ambulation/Gait Ambulation/Gait assistance: Mod assist;+2 safety/equipment Ambulation Distance (Feet): 5 Feet (5 small scooted steps to recliner and then back to bed. limited due to paina nd feeling little light headed.) Assistive device: Rolling walker (2 wheeled) Gait Pattern/deviations: Step-to pattern     General Gait  Details: small scooting hop/step in step to fashion.   Stairs            Wheelchair Mobility    Modified Rankin (Stroke Patients Only)       Balance                                             Pertinent Vitals/Pain Pain Assessment: 0-10 Pain Score: 8  Pain Location: R knee and r ankle on top Pain Descriptors / Indicators: Aching;Burning Pain Intervention(s): Monitored during session;Limited activity within patient's tolerance;Premedicated before session;Ice applied (transferred pt to recliner and then back to bed to help with pain. )    Home Living Family/patient expects to be discharged to:: Private residence Living Arrangements: Spouse/significant other Available Help at Discharge: Family (pt states they will have someone intitally there to help during the day when partner at work) Type of Home: House Home Access: Stairs to enter Entrance Stairs-Rails: None Entrance Stairs-Number of Steps: [place]: Two level;Full bath on main level (all bedrooms on 2nd floor, she does have recliner and pull out couch on 1st level if needed initially. ) Home Equipment: Cane - single point      Prior Function Level of Independence: Independent         Comments: works and occassionally used cane if had to walk a long distance.      Hand Dominance        Extremity/Trunk Assessment  Lower Extremity Assessment: RLE deficits/detail RLE Deficits / Details: 0-40 degrees tolerance for AAROM, generalized 3+/5 for quad, almost independent SLR        Communication   Communication: No difficulties  Cognition Arousal/Alertness: Awake/alert Behavior During Therapy: WFL for tasks assessed/performed Overall Cognitive Status: Within Functional Limits for tasks assessed                      General Comments      Exercises Total Joint Exercises Ankle Circles/Pumps: AROM;Both;5 reps;Supine Quad Sets: AROM;Supine;Right;5  reps Heel Slides: AAROM;Supine;Right;5 reps Straight Leg Raises: AAROM;Supine;Right;5 reps Goniometric ROM: 0-40      Assessment/Plan    PT Assessment Patient needs continued PT services  PT Diagnosis Difficulty walking   PT Problem List Decreased strength;Decreased range of motion;Decreased activity tolerance;Decreased mobility;Decreased knowledge of use of DME  PT Treatment Interventions DME instruction;Gait training;Stair training;Functional mobility training;Therapeutic activities;Therapeutic exercise;Patient/family education   PT Goals (Current goals can be found in the Care Plan section) Acute Rehab PT Goals Patient Stated Goal: I want to be able to walk  PT Goal Formulation: With patient Time For Goal Achievement: 06/10/15 Potential to Achieve Goals: Good    Frequency 7X/week   Barriers to discharge        Co-evaluation               End of Session Equipment Utilized During Treatment: Right knee immobilizer Activity Tolerance: Patient limited by pain Patient left: in bed;with call bell/phone within reach;with family/visitor present Nurse Communication: Mobility status         Time: 1655-1750 PT Time Calculation (min) (ACUTE ONLY): 55 min   Charges:   PT Evaluation $Initial PT Evaluation Tier I: 1 Procedure PT Treatments $Therapeutic Exercise: 8-22 mins $Therapeutic Activity: 23-37 mins   PT G CodesClide Dales 06-23-2015, 6:17 PM  Clide Dales, PT Pager: [phone] 06/23/2015

## 2015-05-27 NOTE — Progress Notes (Signed)
Utilization review completed.  

## 2015-05-27 NOTE — Progress Notes (Signed)
Pt's BP 95/44. Dr. Wynelle Link at bedside for rounds. Order received for 250 cc NS bolus.

## 2015-05-27 NOTE — Op Note (Signed)
Pre-operative diagnosis- Osteoarthritis  Right knee(s)  Post-operative diagnosis- Osteoarthritis Right knee(s)  Procedure-  Right  Total Knee Arthroplasty  Surgeon- Dione Plover. Marieelena Bartko, MD  Assistant- Arlee Muslim, PA-C   Anesthesia-  Spinal  EBL-* No blood loss amount entered *   Drains Hemovac  Tourniquet time-  Total Tourniquet Time Documented: Thigh (Right) - 36 minutes Total: Thigh (Right) - 36 minutes     Complications- None  Condition-PACU - hemodynamically stable.   Brief Clinical Note  Elizabeth Ramirez is a 69 y.o. year old female with end stage OA of her right knee with progressively worsening pain and dysfunction. She has constant pain, with activity and at rest and significant functional deficits with difficulties even with ADLs. She has had extensive non-op management including analgesics, injections of cortisone, and home exercise program, but remains in significant pain with significant dysfunction.Radiographs show bone on bone arthritis lateral and patellofemoral. She presents now for right Total Knee Arthroplasty.    Procedure in detail---   The patient is brought into the operating room and positioned supine on the operating table. After successful administration of  Spinal,   a tourniquet is placed high on the  Right thigh(s) and the lower extremity is prepped and draped in the usual sterile fashion. Time out is performed by the operating team and then the  Right lower extremity is wrapped in Esmarch, knee flexed and the tourniquet inflated to 300 mmHg.       A midline incision is made with a ten blade through the subcutaneous tissue to the level of the extensor mechanism. A fresh blade is used to make a medial parapatellar arthrotomy. Soft tissue over the proximal medial tibia is subperiosteally elevated to the joint line with a knife and into the semimembranosus bursa with a Cobb elevator. Soft tissue over the proximal lateral tibia is elevated with attention being  paid to avoiding the patellar tendon on the tibial tubercle. The patella is everted, knee flexed 90 degrees and the ACL and PCL are removed. Findings are bone on bone lateral and patellofemoral with large global osteophytes.        The drill is used to create a starting hole in the distal femur and the canal is thoroughly irrigated with sterile saline to remove the fatty contents. The 5 degree Right  valgus alignment guide is placed into the femoral canal and the distal femoral cutting block is pinned to remove 9 mm off the distal femur. Resection is made with an oscillating saw.      The tibia is subluxed forward and the menisci are removed. The extramedullary alignment guide is placed referencing proximally at the medial aspect of the tibial tubercle and distally along the second metatarsal axis and tibial crest. The block is pinned to remove 14mm off the more deficient lateral  side. Resection is made with an oscillating saw. Size 7is the most appropriate size for the tibia and the proximal tibia is prepared with the modular drill and keel punch for that size.      The femoral sizing guide is placed and size 7 is most appropriate. Rotation is marked off the epicondylar axis and confirmed by creating a rectangular flexion gap at 90 degrees. The size 7 cutting block is pinned in this rotation and the anterior, posterior and chamfer cuts are made with the oscillating saw. The intercondylar block is then placed and that cut is made.      Trial size 7 tibial component, trial size 7  posterior stabilized femur and a 10  mm posterior stabilized rotating platform insert trial is placed. Full extension is achieved with excellent varus/valgus and anterior/posterior balance throughout full range of motion. The patella is everted and thickness measured to be 22  mm. Free hand resection is taken to 12 mm, a 38 template is placed, lug holes are drilled, trial patella is placed, and it tracks normally. Osteophytes are removed  off the posterior femur with the trial in place. All trials are removed and the cut bone surfaces prepared with pulsatile lavage. Cement is mixed and once ready for implantation, the size 7 tibial implant, size  7 posterior stabilized femoral component, and the size 38 patella are cemented in place and the patella is held with the clamp. The trial insert is placed and the knee held in full extension. The Exparel (20 ml mixed with 30 ml saline) and .25% Bupivicaine, are injected into the extensor mechanism, posterior capsule, medial and lateral gutters and subcutaneous tissues.  All extruded cement is removed and once the cement is hard the permanent 10 mm posterior stabilized rotating platform insert is placed into the tibial tray.      The wound is copiously irrigated with saline solution and the extensor mechanism closed over a hemovac drain with #1 V-loc suture. The tourniquet is released for a total tourniquet time of 36  minutes. Flexion against gravity is 140 degrees and the patella tracks normally. Subcutaneous tissue is closed with 2.0 vicryl and subcuticular with running 4.0 Monocryl. The incision is cleaned and dried and steri-strips and a bulky sterile dressing are applied. The limb is placed into a knee immobilizer and the patient is awakened and transported to recovery in stable condition.      Please note that a surgical assistant was a medical necessity for this procedure in order to perform it in a safe and expeditious manner. Surgical assistant was necessary to retract the ligaments and vital neurovascular structures to prevent injury to them and also necessary for proper positioning of the limb to allow for anatomic placement of the prosthesis.   Dione Plover Tresten Pantoja, MD    05/27/2015, 11:23 AM

## 2015-05-27 NOTE — Anesthesia Preprocedure Evaluation (Addendum)
Anesthesia Evaluation  Patient identified by MRN, date of birth, ID band Patient awake    Reviewed: Allergy & Precautions, NPO status , Patient's Chart, lab work & pertinent test results  History of Anesthesia Complications (+) PONV and history of anesthetic complications  Airway Mallampati: II  TM Distance: >3 FB Neck ROM: Full    Dental  (+) Teeth Intact   Pulmonary former smoker,    breath sounds clear to auscultation       Cardiovascular negative cardio ROS   Rhythm:Regular Rate:Normal     Neuro/Psych negative neurological ROS  negative psych ROS   GI/Hepatic negative GI ROS, Neg liver ROS,   Endo/Other  Hypothyroidism   Renal/GU   negative genitourinary   Musculoskeletal  (+) Arthritis , Osteoarthritis,    Abdominal   Peds negative pediatric ROS (+)  Hematology   Anesthesia Other Findings   Reproductive/Obstetrics negative OB ROS                            Lab Results  Component Value Date   WBC 6.2 05/20/2015   HGB 14.5 05/20/2015   HCT 42.6 05/20/2015   MCV 89.3 05/20/2015   PLT 179 05/20/2015   Lab Results  Component Value Date   INR 0.94 05/20/2015   Lab Results  Component Value Date   CREATININE 1.00 05/20/2015   BUN 18 05/20/2015   NA 139 05/20/2015   K 4.2 05/20/2015   CL 104 05/20/2015   CO2 28 05/20/2015     Anesthesia Physical Anesthesia Plan  ASA: III  Anesthesia Plan: Spinal   Post-op Pain Management:    Induction: Intravenous  Airway Management Planned: Natural Airway and Simple Face Mask  Additional Equipment:   Intra-op Plan:   Post-operative Plan:   Informed Consent: I have reviewed the patients History and Physical, chart, labs and discussed the procedure including the risks, benefits and alternatives for the proposed anesthesia with the patient or authorized representative who has indicated his/her understanding and acceptance.      Plan Discussed with: CRNA  Anesthesia Plan Comments:         Anesthesia Quick Evaluation

## 2015-05-27 NOTE — Transfer of Care (Signed)
Immediate Anesthesia Transfer of Care Note  Patient: Elizabeth Ramirez  Procedure(s) Performed: Procedure(s): RIGHT TOTAL KNEE ARTHROPLASTY (Right)  Patient Location: PACU  Anesthesia Type:Spinal  Level of Consciousness:  sedated, patient cooperative and responds to stimulation  Airway & Oxygen Therapy:Patient Spontanous Breathing and Patient connected to face mask oxgen  Post-op Assessment:  Report given to PACU RN and Post -op Vital signs reviewed and stable  Post vital signs:  Reviewed and stable, patient states headache "5"  Last Vitals:  Filed Vitals:   05/27/15 0730 05/27/15 1200  BP: 128/69   Pulse: 84 74  Temp: 36.6 C   Resp: 18 10    Complications: No apparent anesthesia complications

## 2015-05-28 LAB — CBC
HCT: 34.3 % — ABNORMAL LOW (ref 36.0–46.0)
Hemoglobin: 11.6 g/dL — ABNORMAL LOW (ref 12.0–15.0)
MCH: 30 pg (ref 26.0–34.0)
MCHC: 33.8 g/dL (ref 30.0–36.0)
MCV: 88.6 fL (ref 78.0–100.0)
Platelets: 170 10*3/uL (ref 150–400)
RBC: 3.87 MIL/uL (ref 3.87–5.11)
RDW: 13.7 % (ref 11.5–15.5)
WBC: 9.1 10*3/uL (ref 4.0–10.5)

## 2015-05-28 LAB — BASIC METABOLIC PANEL
Anion gap: 6 (ref 5–15)
BUN: 11 mg/dL (ref 6–20)
CO2: 25 mmol/L (ref 22–32)
Calcium: 8.6 mg/dL — ABNORMAL LOW (ref 8.9–10.3)
Chloride: 104 mmol/L (ref 101–111)
Creatinine, Ser: 0.76 mg/dL (ref 0.44–1.00)
GFR calc Af Amer: >60 mL/min (ref >60)
GFR calc non Af Amer: >60 mL/min (ref >60)
Glucose, Bld: 130 mg/dL — ABNORMAL HIGH (ref 65–99)
Potassium: 3.7 mmol/L (ref 3.5–5.1)
Sodium: 135 mmol/L (ref 135–145)

## 2015-05-28 MED ORDER — PROMETHAZINE HCL 25 MG/ML IJ SOLN
6.2500 mg | Freq: Four times a day (QID) | INTRAMUSCULAR | Status: DC | PRN
  Administered 2015-05-28: 6.25 mg via INTRAVENOUS
  Filled 2015-05-28: qty 1

## 2015-05-28 NOTE — Evaluation (Signed)
Occupational Therapy Evaluation Patient Details Name: Elizabeth Ramirez MRN: [id number] DOB: [birth year] Today's Date: 05/28/2015    History of Present Illness pt s/p R TKA    Clinical Impression   Patient presenting with decreased ADL and functional mobility independence secondary to above. Patient independent PTA. Patient currently functioning at an overall min to mod assist level. Patient will benefit from acute OT to increase overall independence in the areas of ADLs, functional mobility, and overall safety in order to safely discharge home with 24/7 supervision/assist. For the first few days, pt will need 24/7 to ensure safety. IF pt does not have 24/7, ST SNF will be best option.    Follow Up Recommendations  Home health OT;Supervision/Assistance - 24 hour    Equipment Recommendations  3 in 1 bedside comode;Tub/shower bench    Recommendations for Other Services  None at this time    Precautions / Restrictions Precautions Precautions: Knee Required Braces or Orthoses: Knee Immobilizer - Right Knee Immobilizer - Right: Discontinue once straight leg raise with < 10 degree lag Restrictions Weight Bearing Restrictions: Yes RLE Weight Bearing: Weight bearing as tolerated    Mobility Bed Mobility Overal bed mobility: Needs Assistance Bed Mobility: Sit to Supine     Supine to sit: Mod assist     General bed mobility comments: Assist with management of BLEs  Transfers Overall transfer level: Needs assistance Equipment used: Rolling walker (2 wheeled) Transfers: Sit to/from Stand Sit to Stand: Min assist General transfer comment: Cues for hand a leg placement    Balance Overall balance assessment: Needs assistance Sitting-balance support: No upper extremity supported;Feet supported Sitting balance-Leahy Scale: Good     Standing balance support: Bilateral upper extremity supported;During functional activity Standing balance-Leahy Scale: Fair    ADL Overall ADL's :  Needs assistance/impaired Eating/Feeding: Set up;Sitting   Grooming: Set up;Sitting   Upper Body Bathing: Set up;Sitting   Lower Body Bathing: Moderate assistance;Sit to/from stand   Upper Body Dressing : Set up;Sitting   Lower Body Dressing: Moderate assistance;Sit to/from stand   Toilet Transfer: Min guard;RW;BSC;Grab bars;Ambulation   Toileting- Clothing Manipulation and Hygiene: Supervision/safety;Sitting/lateral lean       Functional mobility during ADLs: Supervision/safety;Min guard;Cueing for safety;Rolling walker      Pertinent Vitals/Pain Pain Assessment: Faces Faces Pain Scale: Hurts even more Pain Location: RLE Pain Descriptors / Indicators: Aching;Sore;Discomfort;Grimacing Pain Intervention(s): Limited activity within patient's tolerance;Monitored during session;Repositioned     Hand Dominance Right   Extremity/Trunk Assessment Upper Extremity Assessment Upper Extremity Assessment: RUE deficits/detail;Generalized weakness RUE Deficits / Details: Pt reports decreased strength mobility and needing shoulder replacement surgery   Lower Extremity Assessment Lower Extremity Assessment: Defer to PT evaluation   Cervical / Trunk Assessment Cervical / Trunk Assessment: Normal   Communication Communication Communication: No difficulties   Cognition Arousal/Alertness: Awake/alert Behavior During Therapy: WFL for tasks assessed/performed Overall Cognitive Status: Within Functional Limits for tasks assessed              Home Living Family/patient expects to be discharged to:: Private residence Living Arrangements: Spouse/significant other Available Help at Discharge: Family (pt states they will have someone there during the day when partner at work) Type of Home: House Home Access: Stairs to enter Technical brewer of Steps: 3 Entrance Stairs-Rails: None Home Layout: Two level;Full bath on main level (all bedrooms on second floor, pt does have  recliner and pull out couch on first floor prn initially) Alternate Level Stairs-Number of Steps: 10 then 4 Alternate Level Stairs-Rails:  Right Bathroom Shower/Tub: Tub/shower unit;Curtain   Bathroom Toilet: Standard     Home Equipment: Cane - single point   Prior Functioning/Environment Level of Independence: Independent  Comments: works and occassionally used cane if had to walk a long distance.     OT Diagnosis: Generalized weakness;Acute pain   OT Problem List: Decreased strength;Decreased range of motion;Decreased activity tolerance;Impaired balance (sitting and/or standing);Decreased safety awareness;Decreased knowledge of use of DME or AE;Decreased knowledge of precautions;Pain;Obesity   OT Treatment/Interventions: Self-care/ADL training;Therapeutic exercise;Energy conservation;DME and/or AE instruction;Patient/family education;Therapeutic activities;Balance training    OT Goals(Current goals can be found in the care plan section) Acute Rehab OT Goals Patient Stated Goal: go home safety OT Goal Formulation: With patient/family Time For Goal Achievement: 06/11/15 Potential to Achieve Goals: Good ADL Goals Pt Will Perform Grooming: with modified independence;standing Pt Will Perform Lower Body Bathing: with modified independence;sit to/from stand Pt Will Perform Lower Body Dressing: sit to/from stand;with modified independence Pt Will Transfer to Toilet: with modified independence;ambulating;bedside commode Pt Will Perform Tub/Shower Transfer: Tub transfer;ambulating;tub bench;rolling walker;with modified independence Pt/caregiver will Perform Home Exercise Program: Increased strength;With Supervision;With theraband;Both right and left upper extremity;With written HEP provided  OT Frequency: Min 2X/week   Barriers to D/C: Decreased caregiver support   End of Session Equipment Utilized During Treatment: Rolling walker CPM Right Knee CPM Right Knee: On  Activity Tolerance:  Patient tolerated treatment well Patient left: in bed;with call bell/phone within reach;with family/visitor present   Time: [id number] OT Time Calculation (min): 23 min Charges:  OT General Charges $OT Visit: 1 Procedure OT Evaluation $Initial OT Evaluation Tier I: 1 Procedure OT Treatments $Self Care/Home Management : 8-22 mins  Chrys Racer , MS, OTR/L, CLT Pager: [phone]  05/28/2015, 3:37 PM

## 2015-05-28 NOTE — Progress Notes (Signed)
Physical Therapy Treatment Patient Details Name: Elizabeth Ramirez MRN: [id number] DOB: 31-Jan-1946 Today's Date: 05/28/2015    History of Present Illness pt s/p R TKA     PT Comments    POD #1  pm session.  Assisted with amb a greater distance.  Assisted back to bed and performed TKR TE's followed by CPM.  Pt progressing slowly.    Follow Up Recommendations  Home health PT     Equipment Recommendations  Rolling walker with 5" wheels    Recommendations for Other Services       Precautions / Restrictions Precautions Precautions: Knee Precaution Comments: instructed on KI use for amb and stairs Required Braces or Orthoses: Knee Immobilizer - Right Knee Immobilizer - Right: Discontinue once straight leg raise with < 10 degree lag Restrictions Weight Bearing Restrictions: No RLE Weight Bearing: Weight bearing as tolerated    Mobility  Bed Mobility Overal bed mobility: Needs Assistance Bed Mobility: Supine to Sit     Supine to sit: Mod assist;Min assist Sit to supine: Mod assist;Max assist   General bed mobility comments: assisted back to bed for CPM  Transfers Overall transfer level: Needs assistance Equipment used: Rolling walker (2 wheeled) Transfers: Sit to/from Stand Sit to Stand: Min assist         General transfer comment: 25% VC's on proper tech and hand placement  Ambulation/Gait Ambulation/Gait assistance: Min assist Ambulation Distance (Feet): 15 Feet   Gait Pattern/deviations: Step-to pattern;Decreased stance time - right Gait velocity: decreased   General Gait Details: 50% VC's on proper sequencing, proper walker to self placement, upright posture and increased time.  Pt required 3 sitting rest breaks due to nausea to complete 12 feet.    Stairs            Wheelchair Mobility    Modified Rankin (Stroke Patients Only)       Balance Overall balance assessment: Needs assistance Sitting-balance support: No upper extremity  supported;Feet supported Sitting balance-Leahy Scale: Good     Standing balance support: Bilateral upper extremity supported;During functional activity Standing balance-Leahy Scale: Fair                      Cognition Arousal/Alertness: Awake/alert Behavior During Therapy: WFL for tasks assessed/performed Overall Cognitive Status: Within Functional Limits for tasks assessed                      Exercises   Total Knee Replacement TE's 10 reps B LE ankle pumps 10 reps towel squeezes 10 reps knee presses 10 reps heel slides  10 reps SAQ's 10 reps SLR's 10 reps ABD Followed by ICE     General Comments        Pertinent Vitals/Pain Pain Assessment: 0-10 Pain Score: 8  Faces Pain Scale: Hurts even more Pain Location: R knee Pain Descriptors / Indicators: Constant;Grimacing;Sore;Tender Pain Intervention(s): Monitored during session;Premedicated before session;Repositioned;Ice applied    Home Living Family/patient expects to be discharged to:: Private residence Living Arrangements: Spouse/significant other Available Help at Discharge: Family (pt states they will have someone there during the day when partner at work) Type of Home: House Home Access: Stairs to enter Entrance Stairs-Rails: None Home Layout: Two level;Full bath on main level (all bedrooms on second floor, pt does have recliner and pull out couch on first floor prn initially) Home Equipment: Cane - single point      Prior Function Level of Independence: Independent      Comments:  works and occassionally used cane if had to walk a long distance.    PT Goals (current goals can now be found in the care plan section) Acute Rehab PT Goals Patient Stated Goal: go home safety Progress towards PT goals: Progressing toward goals    Frequency  7X/week    PT Plan      Co-evaluation             End of Session Equipment Utilized During Treatment: Right knee immobilizer Activity  Tolerance: Patient limited by pain Patient left: in chair;with call bell/phone within reach;with family/visitor present     Time: 1405-1430 PT Time Calculation (min) (ACUTE ONLY): 25 min  Charges:  $Gait Training: 8-22 mins $Therapeutic Exercise: 8-22 mins                    G Codes:      Rica Koyanagi  PTA [facility]  Acute  Rehab Pager      [phone]

## 2015-05-28 NOTE — Clinical Social Work Note (Signed)
Clinical Social Work Assessment  Patient Details  Name: Elizabeth Ramirez MRN: [id number] Date of Birth: 1945-10-11  Date of referral:  05/28/15               Reason for consult:  Facility Placement, Discharge Planning                Permission sought to share information with:  Chartered certified accountant granted to share information::  Yes, Verbal Permission Granted  Name::        Agency::     Relationship::     Contact Information:     Housing/Transportation Living arrangements for the past 2 months:  Single Family Home Source of Information:  Patient Patient Interpreter Needed:    Criminal Activity/Legal Involvement Pertinent to Current Situation/Hospitalization:  No - Comment as needed Significant Relationships:  Significant Other Lives with:  Significant Other Do you feel safe going back to the place where you live?   (ST Rehab may be needed.) Need for family participation in patient care:  No (Coment)  Care giving concerns:  Pt may benefit from [place].   Social Worker assessment / plan:  Pt hospitalized on 05/27/15 for pre planned Right Total Knee Arthroplasty. CSW consulted to assist with d/c planning. Pt feels she may benefit from [place] and has requested [facility]. SNF is unable to accept pt due to an out standing claim. SNF has requested pt's lawyer contact facility. Pt has [employer] which will require prior approval for SNF placement. CSW will contact insurance and request authorization in case [facility] is able to accept pt for [facility] Re[facility].  Employment status:  Therapist, music:  Managed Care PT Recommendations:  [place], Home with [facility] / Referral to community resources:  [place]  Patient/Family's Response to care:  Pt feels she may benefit from [place] prior to returning home.  Patient/Family's Understanding of and Emotional Response to Diagnosis, Current Treatment, and  Prognosis:  Pt is aware of her medical status. She is hopeful that her prior claims issue can be worked out so she can go to [employer] for [employer].  Emotional Assessment Appearance:  Appears stated age Attitude/Demeanor/Rapport:  Other (cooperative) Affect (typically observed):  Apprehensive Orientation:  Oriented to Self, Oriented to Place, Oriented to  Time, Oriented to Situation Alcohol / Substance use:  Not Applicable Psych involvement (Current and /or in the community):  No (Comment)  Discharge Needs  Concerns to be addressed:  Discharge Planning Concerns Readmission within the last 30 days:  No Current discharge risk:  None Barriers to Discharge:  No Barriers Identified   Luretha Rued, [place] 05/28/2015, 4:12 PM

## 2015-05-28 NOTE — Clinical Social Work Placement (Signed)
   CLINICAL SOCIAL WORK PLACEMENT  NOTE  Date:  05/28/2015  Patient Details  Name: Elizabeth Ramirez MRN: [id number] Date of Birth: Nov 26, 1945  Clinical Social Work is seeking post-discharge placement for this patient at the [place] level of care (*CSW will initial, date and re-position this form in  chart as items are completed):  No   Patient/family provided with [place] Work Department's list of facilities offering this level of care within the geographic area requested by the patient (or if unable, by the patient's family).  Yes   Patient/family informed of their freedom to choose among providers that offer the needed level of care, that participate in Medicare, Medicaid or managed care program needed by the patient, have an available bed and are willing to accept the patient.  No   Patient/family informed of [facility]'s ownership interest in [facility] and [facility], as well as of the fact that they are under no obligation to receive care at these facilities.  PASRR submitted to EDS on 05/28/15     PASRR number received on 05/28/15     Existing PASRR number confirmed on       FL2 transmitted to all facilities in geographic area requested by pt/family on 05/28/15     FL2 transmitted to all facilities within larger geographic area on       Patient informed that his/her managed care company has contracts with or will negotiate with certain facilities, including the following:            Patient/family informed of bed offers received.  Patient chooses bed at       Physician recommends and patient chooses bed at      Patient to be transferred to   on  .  Patient to be transferred to facility by       Patient family notified on   of transfer.  Name of family member notified:        PHYSICIAN       Additional Comment:    _______________________________________________ Luretha Rued, [place] 05/28/2015,  4:22 PM

## 2015-05-28 NOTE — Progress Notes (Signed)
   Subjective: 1 Day Post-Op Procedure(s) (LRB): RIGHT TOTAL KNEE ARTHROPLASTY (Right) Patient reports pain as moderate.   Better this morning. Patient seen in rounds with Dr. Wynelle Link. Patient is well, but has had some minor complaints of pain in the knee, requiring pain medications We will resume therapy today.  Walked 5 feet yesterday. Plan is to go to [facility] depending on insurance after hospital stay.  Objective: Vital signs in last 24 hours: Temp:  [97.5 F (36.4 C)-98.3 F (36.8 C)] 97.8 F (36.6 C) (12/13 0537) Pulse Rate:  [61-88] 79 (12/13 0537) Resp:  [10-18] 16 (12/13 0537) BP: (95-119)/(44-78) 101/54 mmHg (12/13 0537) SpO2:  [93 %-100 %] 96 % (12/13 0537)  Intake/Output from previous day:  Intake/Output Summary (Last 24 hours) at 05/28/15 0838 Last data filed at 05/28/15 0700  Gross per 24 hour  Intake 5178.75 ml  Output   3305 ml  Net 1873.75 ml    Intake/Output this shift: UOP 1700  Labs:  Recent Labs  05/28/15 0459  HGB 11.6*    Recent Labs  05/28/15 0459  WBC 9.1  RBC 3.87  HCT 34.3*  PLT 170    Recent Labs  05/28/15 0459  NA 135  K 3.7  CL 104  CO2 25  BUN 11  CREATININE 0.76  GLUCOSE 130*  CALCIUM 8.6*   No results for input(s): LABPT, INR in the last 72 hours.  EXAM General - Patient is Alert, Appropriate and Oriented Extremity - Neurovascular intact Sensation intact distally Dressing - dressing C/D/I Motor Function - intact, moving foot and toes well on exam.  Hemovac pulled without difficulty.  Past Medical History  Diagnosis Date  . Thyroid disease   . Allergy     tetracycline, levofloxacin, crabs & bay leaves  . Hypothyroidism   . Arthritis     Rt knee, Rt shoulder  . PONV (postoperative nausea and vomiting)   . Seasonal allergies   . History of kidney stones     x1 '71  . Cancer ([place])   . 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   Weight as of this encounter: 115.214 kg (254 lb). Advance diet Up with therapy Plan for discharge tomorrow Discharge to SNF - [facility]  DVT Prophylaxis - Xarelto Weight-Bearing as tolerated to right leg D/C O2 and Pulse OX and try on Room Air  Arlee Muslim, PA-C Orthopaedic Surgery 05/28/2015, 8:38 AM

## 2015-05-28 NOTE — Discharge Summary (Signed)
Physician Discharge Summary   Patient ID: [name] MRN: [id number] DOB/AGE: [birth year] 69 y.o.  Admit date: 05/27/2015 Discharge date: 05/29/2015  Primary Diagnosis:  Osteoarthritis Right knee(s) Admission Diagnoses:  Past Medical History  Diagnosis Date  . Thyroid disease   . Allergy     tetracycline, levofloxacin, crabs & bay leaves  . Hypothyroidism   . Arthritis     Rt knee, Rt shoulder  . PONV (postoperative nausea and vomiting)   . Seasonal allergies   . History of kidney stones     x1 '71  . Cancer ([place])   . 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   Weight as of this encounter: 115.214 kg (254 lb).  Procedure:  Procedure(s) (LRB): RIGHT TOTAL KNEE ARTHROPLASTY (Right)   Consults: None  HPI: Elizabeth Ramirez is a 69 y.o. year old female with end stage OA of her right knee with progressively worsening pain and dysfunction. She has constant pain, with activity and at rest and significant functional deficits with difficulties even with ADLs. She has had extensive non-op management including analgesics, injections of cortisone, and home exercise program, but remains in significant pain with significant dysfunction.Radiographs show bone on bone arthritis lateral and patellofemoral. She presents now for right Total Knee Arthroplasty.  Laboratory Data: Admission on 05/27/2015  Component Date Value Ref Range Status  . WBC 05/28/2015 9.1  4.0 - 10.5 K/uL Final  . RBC 05/28/2015 3.87  3.87 - 5.11 MIL/uL Final  . Hemoglobin 05/28/2015 11.6* 12.0 - 15.0 g/dL Final  . HCT 05/28/2015 34.3* 36.0 - 46.0 % Final  . MCV 05/28/2015  88.6  78.0 - 100.0 fL Final  . MCH 05/28/2015 30.0  26.0 - 34.0 pg Final  . MCHC 05/28/2015 33.8  30.0 - 36.0 g/dL Final  . RDW 05/28/2015 13.7  11.5 - 15.5 % Final  . Platelets 05/28/2015 170  150 - 400 K/uL Final  . Sodium 05/28/2015 135  135 - 145 mmol/L Final  . Potassium 05/28/2015 3.7  3.5 - 5.1 mmol/L Final  . Chloride 05/28/2015 104  101 - 111 mmol/L Final  . CO2 05/28/2015 25  22 - 32 mmol/L Final  . Glucose, Bld 05/28/2015 130* 65 - 99 mg/dL Final  . BUN 05/28/2015 11  6 - 20 mg/dL Final  . Creatinine, Ser 05/28/2015 0.76  0.44 - 1.00 mg/dL Final  . Calcium 05/28/2015 8.6* 8.9 - 10.3 mg/dL Final  . GFR calc non Af Amer 05/28/2015 >60  >60 mL/min Final  . GFR calc Af Amer 05/28/2015 >60  >60 mL/min Final   Comment: (NOTE) The eGFR has been calculated using the CKD EPI equation. This calculation has not been validated in all clinical situations. eGFR's persistently <60 mL/min signify possible Chronic Kidney Disease.   . Anion gap 05/28/2015 6  5 - 15 Final  . WBC 05/29/2015 8.2  4.0 - 10.5 K/uL Final  . RBC 05/29/2015 3.55* 3.87 - 5.11 MIL/uL Final  . Hemoglobin 05/29/2015 10.8* 12.0 - 15.0 g/dL Final  . HCT 05/29/2015 31.9* 36.0 - 46.0 % Final  . MCV 05/29/2015 89.9  78.0 - 100.0 fL  Final  . MCH 05/29/2015 30.4  26.0 - 34.0 pg Final  . MCHC 05/29/2015 33.9  30.0 - 36.0 g/dL Final  . RDW 05/29/2015 14.0  11.5 - 15.5 % Final  . Platelets 05/29/2015 137* 150 - 400 K/uL Final  . Sodium 05/29/2015 137  135 - 145 mmol/L Final  . Potassium 05/29/2015 3.9  3.5 - 5.1 mmol/L Final  . Chloride 05/29/2015 105  101 - 111 mmol/L Final  . CO2 05/29/2015 26  22 - 32 mmol/L Final  . Glucose, Bld 05/29/2015 124* 65 - 99 mg/dL Final  . BUN 05/29/2015 11  6 - 20 mg/dL Final  . Creatinine, Ser 05/29/2015 0.90  0.44 - 1.00 mg/dL Final  . Calcium 05/29/2015 8.3* 8.9 - 10.3 mg/dL Final  . GFR calc non Af Amer 05/29/2015 >60  >60 mL/min Final  . GFR calc Af Amer 05/29/2015 >60  >60 mL/min  Final   Comment: (NOTE) The eGFR has been calculated using the CKD EPI equation. This calculation has not been validated in all clinical situations. eGFR's persistently <60 mL/min signify possible Chronic Kidney Disease.   Georgiann Hahn gap 05/29/2015 6  5 - 15 Final  Hospital Outpatient Visit on 05/20/2015  Component Date Value Ref Range Status  . MRSA, PCR 05/20/2015 NEGATIVE  NEGATIVE Final  . Staphylococcus aureus 05/20/2015 NEGATIVE  NEGATIVE Final   Comment:        The Xpert SA Assay (FDA approved for NASAL specimens in patients over 21 years of age), is one component of a comprehensive surveillance program.  Test performance has been validated by [facility] for patients greater than or equal to 59 year old. It is not intended to diagnose infection nor to guide or monitor treatment.   Marland Kitchen aPTT 05/20/2015 30  24 - 37 seconds Final  . WBC 05/20/2015 6.2  4.0 - 10.5 K/uL Final  . RBC 05/20/2015 4.77  3.87 - 5.11 MIL/uL Final  . Hemoglobin 05/20/2015 14.5  12.0 - 15.0 g/dL Final  . HCT 05/20/2015 42.6  36.0 - 46.0 % Final  . MCV 05/20/2015 89.3  78.0 - 100.0 fL Final  . MCH 05/20/2015 30.4  26.0 - 34.0 pg Final  . MCHC 05/20/2015 34.0  30.0 - 36.0 g/dL Final  . RDW 05/20/2015 13.8  11.5 - 15.5 % Final  . Platelets 05/20/2015 179  150 - 400 K/uL Final  . Sodium 05/20/2015 139  135 - 145 mmol/L Final  . Potassium 05/20/2015 4.2  3.5 - 5.1 mmol/L Final  . Chloride 05/20/2015 104  101 - 111 mmol/L Final  . CO2 05/20/2015 28  22 - 32 mmol/L Final  . Glucose, Bld 05/20/2015 101* 65 - 99 mg/dL Final  . BUN 05/20/2015 18  6 - 20 mg/dL Final  . Creatinine, Ser 05/20/2015 1.00  0.44 - 1.00 mg/dL Final  . Calcium 05/20/2015 9.2  8.9 - 10.3 mg/dL Final  . Total Protein 05/20/2015 6.8  6.5 - 8.1 g/dL Final  . Albumin 05/20/2015 4.1  3.5 - 5.0 g/dL Final  . AST 05/20/2015 21  15 - 41 U/L Final  . ALT 05/20/2015 24  14 - 54 U/L Final  . Alkaline Phosphatase 05/20/2015 82  38 - 126 U/L  Final  . Total Bilirubin 05/20/2015 1.6* 0.3 - 1.2 mg/dL Final  . GFR calc non Af Amer 05/20/2015 56* >60 mL/min Final  . GFR calc Af Amer 05/20/2015 >60  >60 mL/min Final   Comment: (NOTE) The eGFR has  been calculated using the CKD EPI equation. This calculation has not been validated in all clinical situations. eGFR's persistently <60 mL/min signify possible Chronic Kidney Disease.   . Anion gap 05/20/2015 7  5 - 15 Final  . Prothrombin Time 05/20/2015 12.8  11.6 - 15.2 seconds Final  . INR 05/20/2015 0.94  0.00 - 1.49 Final  . ABO/RH(D) 05/20/2015 O NEG   Final  . Antibody Screen 05/20/2015 NEG   Final  . Sample Expiration 05/20/2015 05/30/2015   Final  . Extend sample reason 05/20/2015 NO TRANSFUSIONS OR PREGNANCY IN THE PAST 3 MONTHS   Final  . Color, Urine 05/20/2015 YELLOW  YELLOW Final  . APPearance 05/20/2015 CLEAR  CLEAR Final  . Specific Gravity, Urine 05/20/2015 1.009  1.005 - 1.030 Final  . pH 05/20/2015 6.5  5.0 - 8.0 Final  . Glucose, UA 05/20/2015 NEGATIVE  NEGATIVE mg/dL Final  . Hgb urine dipstick 05/20/2015 NEGATIVE  NEGATIVE Final  . Bilirubin Urine 05/20/2015 NEGATIVE  NEGATIVE Final  . Ketones, ur 05/20/2015 NEGATIVE  NEGATIVE mg/dL Final  . Protein, ur 05/20/2015 NEGATIVE  NEGATIVE mg/dL Final  . Nitrite 05/20/2015 NEGATIVE  NEGATIVE Final  . Leukocytes, UA 05/20/2015 NEGATIVE  NEGATIVE Final   MICROSCOPIC NOT DONE ON URINES WITH NEGATIVE PROTEIN, BLOOD, LEUKOCYTES, NITRITE, OR GLUCOSE <1000 mg/dL.  . ABO/RH(D) 05/20/2015 O NEG   Final     X-Rays:No results found.  EKG:No orders found for this or any previous visit.   Hospital Course: Elizabeth Ramirez is a 69 y.o. who was admitted to [facility] They were brought to the operating room on 05/27/2015 and underwent Procedure(s): RIGHT TOTAL KNEE ARTHROPLASTY.  Patient tolerated the procedure well and was later transferred to the recovery room and then to the orthopaedic floor for postoperative care.   They were given PO and IV analgesics for pain control following their surgery.  They were given 24 hours of postoperative antibiotics of  Anti-infectives    Start     Dose/Rate Route Frequency Ordered Stop   05/27/15 1600  ceFAZolin (ANCEF) IVPB 2 g/50 mL premix     2 g 100 mL/hr over 30 Minutes Intravenous Every 6 hours 05/27/15 1345 05/27/15 2305   05/27/15 0824  ceFAZolin (ANCEF) IVPB 2 g/50 mL premix     2 g 100 mL/hr over 30 Minutes Intravenous On call to O.R. 05/27/15 7510 05/27/15 1030     and started on DVT prophylaxis in the form of Xarelto.   PT and OT were ordered for total joint protocol.  Discharge planning consulted to help with postop disposition and equipment needs.  Patient had a decent night on the evening of surgery.  They started to get up OOB with therapy on day one. Hemovac drain was pulled without difficulty.  Continued to work with therapy into day two.  Dressing was changed on day two and the incision was healing well.  Patient was seen in rounds on POD 2 and was ready to go the SNF of choice.  Diet: Cardiac diet Activity:WBAT Follow-up:in 2 weeks Disposition - Skilled nursing facility Discharged Condition: good     Discharge Instructions    Call MD / Call 911    Complete by:  As directed   If you experience chest pain or shortness of breath, CALL 911 and be transported to the hospital emergency room.  If you develope a fever above 101 F, pus (white drainage) or increased drainage or redness at the wound, or calf  pain, call your surgeon's office.     Change dressing    Complete by:  As directed   Change dressing daily with sterile 4 x 4 inch gauze dressing and apply TED hose. Do not submerge the incision under water.     Constipation Prevention    Complete by:  As directed   Drink plenty of fluids.  Prune juice may be helpful.  You may use a stool softener, such as Colace (over the counter) 100 mg twice a day.  Use MiraLax (over the counter) for constipation as  needed.     Diet general    Complete by:  As directed      Discharge instructions    Complete by:  As directed   Pick up stool softner and laxative for home use following surgery while on pain medications. Do not submerge incision under water. Please use good hand washing techniques while changing dressing each day. May shower starting three days after surgery. Please use a clean towel to pat the incision dry following showers. Continue to use ice for pain and swelling after surgery. Do not use any lotions or creams on the incision until instructed by your surgeon.  Take Xarelto for two and a half more weeks, then discontinue Xarelto. Once the patient has completed the blood thinner regimen, then take a Baby 81 mg Aspirin daily for three more weeks.  Postoperative Constipation Protocol  Constipation - defined medically as fewer than three stools per week and severe constipation as less than one stool per week.  One of the most common issues patients have following surgery is constipation.  Even if you have a regular bowel pattern at home, your normal regimen is likely to be disrupted due to multiple reasons following surgery.  Combination of anesthesia, postoperative narcotics, change in appetite and fluid intake all can affect your bowels.  In order to avoid complications following surgery, here are some recommendations in order to help you during your recovery period.  Colace (docusate) - Pick up an over-the-counter form of Colace or another stool softener and take twice a day as long as you are requiring postoperative pain medications.  Take with a full glass of water daily.  If you experience loose stools or diarrhea, hold the colace until you stool forms back up.  If your symptoms do not get better within 1 week or if they get worse, check with your doctor.  Dulcolax (bisacodyl) - Pick up over-the-counter and take as directed by the product packaging as needed to assist with the movement  of your bowels.  Take with a full glass of water.  Use this product as needed if not relieved by Colace only.   MiraLax (polyethylene glycol) - Pick up over-the-counter to have on hand.  MiraLax is a solution that will increase the amount of water in your bowels to assist with bowel movements.  Take as directed and can mix with a glass of water, juice, soda, coffee, or tea.  Take if you go more than two days without a movement. Do not use MiraLax more than once per day. Call your doctor if you are still constipated or irregular after using this medication for 7 days in a row.  If you continue to have problems with postoperative constipation, please contact the office for further assistance and recommendations.  If you experience "the worst abdominal pain ever" or develop nausea or vomiting, please contact the office immediatly for further recommendations for treatment.  When discharged from  the skilled rehab facility, please have the facility set up the patient's [place] prior to being released.  Please make sure this gets set up prior to release in order to avoid any lapse of therapy following the rehab stay.  Also provide the patient with their medications at time of release from the facility to include their pain medication, the muscle relaxants, and their blood thinner medication.  If the patient is still at the rehab facility at time of follow up appointment, please also assist the patient in arranging follow up appointment in our office and any transportation needs. ICE to the affected knee or hip every three hours for 30 minutes at a time and then as needed for pain and swelling.     Do not put a pillow under the knee. Place it under the heel.    Complete by:  As directed      Do not sit on low chairs, stoools or toilet seats, as it may be difficult to get up from low surfaces    Complete by:  As directed      Driving restrictions    Complete by:  As directed   No driving  until released by the physician.     Increase activity slowly as tolerated    Complete by:  As directed      Lifting restrictions    Complete by:  As directed   No lifting until released by the physician.     Patient may shower    Complete by:  As directed   You may shower without a dressing once there is no drainage.  Do not wash over the wound.  If drainage remains, do not shower until drainage stops.     TED hose    Complete by:  As directed   Use stockings (TED hose) for 3 weeks on both leg(s).  You may remove them at night for sleeping.     Weight bearing as tolerated    Complete by:  As directed   Laterality:  right  Extremity:  Lower            Medication List    STOP taking these medications        CALTRATE 600+D PO     FLAX SEEDS PO     L-Lysine 1000 MG Tabs      TAKE these medications        acetaminophen 325 MG tablet  Commonly known as:  TYLENOL  Take 2 tablets (650 mg total) by mouth every 6 (six) hours as needed for mild pain (or Fever >/= 101).     anastrozole 1 MG tablet  Commonly known as:  ARIMIDEX  Take 1 tablet (1 mg total) by mouth daily.     bisacodyl 10 MG suppository  Commonly known as:  DULCOLAX  Place 1 suppository (10 mg total) rectally daily as needed for moderate constipation.     diphenhydrAMINE 12.5 MG/5ML elixir  Commonly known as:  BENADRYL  Take 5-10 mLs (12.5-25 mg total) by mouth every 4 (four) hours as needed for itching.     docusate sodium 100 MG capsule  Commonly known as:  COLACE  Take 1 capsule (100 mg total) by mouth 2 (two) times daily.     ipratropium 0.03 % nasal spray  Commonly known as:  ATROVENT  Place 2 sprays into the nose every 12 (twelve) hours.     levothyroxine 137 MCG tablet  Commonly known as:  SYNTHROID, LEVOTHROID  Take 137 mcg by mouth daily before breakfast.     methocarbamol 500 MG tablet  Commonly known as:  ROBAXIN  Take 1 tablet (500 mg total) by mouth every 6 (six) hours as needed for  muscle spasms.     metoCLOPramide 5 MG tablet  Commonly known as:  REGLAN  Take 1 tablet (5 mg total) by mouth every 8 (eight) hours as needed for nausea (if ondansetron (ZOFRAN) ineffective.).     ondansetron 4 MG tablet  Commonly known as:  ZOFRAN  Take 1 tablet (4 mg total) by mouth every 6 (six) hours as needed for nausea.     oxyCODONE 5 MG immediate release tablet  Commonly known as:  Oxy IR/ROXICODONE  Take 1-2 tablets (5-10 mg total) by mouth every 3 (three) hours as needed for moderate pain or severe pain.     polyethylene glycol packet  Commonly known as:  MIRALAX / GLYCOLAX  Take 17 g by mouth daily as needed for mild constipation.     pseudoephedrine-guaifenesin 60-600 MG 12 hr tablet  Commonly known as:  MUCINEX D  Take 1 tablet by mouth daily as needed for congestion (allergies).     rivaroxaban 10 MG Tabs tablet  Commonly known as:  XARELTO  Take 1 tablet (10 mg total) by mouth daily with breakfast. Take Xarelto for two and a half more weeks, then discontinue Xarelto. Once the patient has completed the blood thinner regimen, then take a Baby 81 mg Aspirin daily for three more weeks.     sodium phosphate 7-19 GM/118ML Enem  Place 133 mLs (1 enema total) rectally once as needed for moderate constipation or severe constipation.     traMADol 50 MG tablet  Commonly known as:  ULTRAM  Take 1-2 tablets (50-100 mg total) by mouth every 6 (six) hours as needed (mild pain).     valACYclovir 1000 MG tablet  Commonly known as:  VALTREX  Take 2 g by mouth See admin instructions. Take 2g at onset of cold sore symptoms, repeat after 12 hours two times.       Follow-up Information    Follow up with Gearlean Alf, MD In 2 weeks.   Specialty:  Orthopedic Surgery   Why:  Call office ASAP at [phone] to setup appointment in two weeks with one of the PAs for Dr. Wynelle Link.   Contact information:   [address] [place] [address] [phone]        Signed: Arlee Muslim, PA-C Orthopaedic Surgery 05/29/2015, 2:38 PM

## 2015-05-28 NOTE — Progress Notes (Signed)
Physical Therapy Treatment Patient Details Name: Elizabeth Ramirez MRN: [id number] DOB: 02-13-1946 Today's Date: 05/28/2015    History of Present Illness pt s/p R TKA     PT Comments    POD # 1 am session. Applied KI and instructed on use. Assisted OOB with increased time to [facility] to void.  Assisted with amb a limited distance due to MAX c/o nausea and mild dizziness.  BP stable in both sitting and standing.  Returned to room and applied ICE.  Follow Up Recommendations  Home health PT     Equipment Recommendations  Rolling walker with 5" wheels    Recommendations for Other Services       Precautions / Restrictions Precautions Precautions: Knee Precaution Comments: instructed on KI use for amb and stairs Required Braces or Orthoses: Knee Immobilizer - Right Knee Immobilizer - Right: Discontinue once straight leg raise with < 10 degree lag Restrictions Weight Bearing Restrictions: No RLE Weight Bearing: Weight bearing as tolerated    Mobility  Bed Mobility Overal bed mobility: Needs Assistance Bed Mobility: Supine to Sit     Supine to sit: Mod assist;Min assist     General bed mobility comments: increased time esp to scoot to EOB  Transfers Overall transfer level: Needs assistance Equipment used: Rolling walker (2 wheeled) Transfers: Sit to/from Stand Sit to Stand: Min assist         General transfer comment: 25% VC's on proper tech and hand placement  Ambulation/Gait Ambulation/Gait assistance: Min assist Ambulation Distance (Feet): 12 Feet (5', 4', 3' (3 sitting rest breaks))   Gait Pattern/deviations: Step-to pattern;Decreased stance time - right Gait velocity: decreased   General Gait Details: 50% VC's on proper sequencing, proper walker to self placement, upright posture and increased time.  Pt required 3 sitting rest breaks due to nausea to complete 12 feet.    Stairs            Wheelchair Mobility    Modified Rankin (Stroke Patients  Only)       Balance Overall balance assessment: Needs assistance Sitting-balance support: No upper extremity supported;Feet supported Sitting balance-Leahy Scale: Good     Standing balance support: Bilateral upper extremity supported;During functional activity Standing balance-Leahy Scale: Fair                      Cognition Arousal/Alertness: Awake/alert Behavior During Therapy: WFL for tasks assessed/performed Overall Cognitive Status: Within Functional Limits for tasks assessed                      Exercises      General Comments        Pertinent Vitals/Pain Pain Assessment: 0-10 Pain Score: 8  Faces Pain Scale: Hurts even more Pain Location: R knee Pain Descriptors / Indicators: Constant;Grimacing;Sore;Tender Pain Intervention(s): Monitored during session;Premedicated before session;Repositioned;Ice applied    Home Living Family/patient expects to be discharged to:: Private residence Living Arrangements: Spouse/significant other Available Help at Discharge: Family (pt states they will have someone there during the day when partner at work) Type of Home: House Home Access: Stairs to enter Entrance Stairs-Rails: None Home Layout: Two level;Full bath on main level (all bedrooms on second floor, pt does have recliner and pull out couch on first floor prn initially) Home Equipment: Cane - single point      Prior Function Level of Independence: Independent      Comments: works and occassionally used cane if had to walk a long distance.  PT Goals (current goals can now be found in the care plan section) Acute Rehab PT Goals Patient Stated Goal: go home safety Progress towards PT goals: Progressing toward goals    Frequency  7X/week    PT Plan      Co-evaluation             End of Session Equipment Utilized During Treatment: Right knee immobilizer Activity Tolerance: Patient limited by pain Patient left: in chair;with call  bell/phone within reach;with family/visitor present     Time: 1035-1110 PT Time Calculation (min) (ACUTE ONLY): 35 min  Charges:  $Gait Training: 8-22 mins $Therapeutic Activity: 8-22 mins                    G Codes:      Rica Koyanagi  PTA [facility]  Acute  Rehab Pager      [phone]

## 2015-05-28 NOTE — Discharge Instructions (Addendum)
° °Dr. Frank [name] °Total Joint Specialist °[facility] °[address] °[place], [place] [address] °[phone] ° °TOTAL KNEE REPLACEMENT POSTOPERATIVE DIRECTIONS ° °Knee Rehabilitation, Guidelines Following Surgery  °Results after knee surgery are often greatly improved when you follow the exercise, range of motion and muscle strengthening exercises prescribed by your doctor. Safety measures are also important to protect the knee from further injury. Any time any of these exercises cause you to have increased pain or swelling in your knee joint, decrease the amount until you are comfortable again and slowly increase them. If you have problems or questions, call your caregiver or physical therapist for advice.  ° °HOME CARE INSTRUCTIONS  °Remove items at home which could result in a fall. This includes throw rugs or furniture in walking pathways.  °· ICE to the affected knee every three hours for 30 minutes at a time and then as needed for pain and swelling.  Continue to use ice on the knee for pain and swelling from surgery. You may notice swelling that will progress down to the foot and ankle.  This is normal after surgery.  Elevate the leg when you are not up walking on it.   °· Continue to use the breathing machine which will help keep your temperature down.  It is common for your temperature to cycle up and down following surgery, especially at night when you are not up moving around and exerting yourself.  The breathing machine keeps your lungs expanded and your temperature down. °· Do not place pillow under knee, focus on keeping the knee straight while resting ° °DIET °You may resume your previous home diet once your are discharged from the hospital. ° °DRESSING / WOUND CARE / SHOWERING °You may shower 3 days after surgery, but keep the wounds dry during showering.  You may use an occlusive plastic wrap (Press'n Seal for example), NO SOAKING/SUBMERGING IN THE BATHTUB.  If the  bandage gets wet, change with a clean dry gauze.  If the incision gets wet, pat the wound dry with a clean towel. °You may start showering once you are discharged home but do not submerge the incision under water. Just pat the incision dry and apply a dry gauze dressing on daily. °Change the surgical dressing daily and reapply a dry dressing each time. ° °ACTIVITY °Walk with your walker as instructed. °Use walker as long as suggested by your caregivers. °Avoid periods of inactivity such as sitting longer than an hour when not asleep. This helps prevent blood clots.  °You may resume a sexual relationship in one month or when given the OK by your doctor.  °You may return to work once you are cleared by your doctor.  °Do not drive a car for 6 weeks or until released by you surgeon.  °Do not drive while taking narcotics. ° °WEIGHT BEARING °Weight bearing as tolerated with assist device (walker, cane, etc) as directed, use it as long as suggested by your surgeon or therapist, typically at least 4-6 weeks. ° °POSTOPERATIVE CONSTIPATION PROTOCOL °Constipation - defined medically as fewer than three stools per week and severe constipation as less than one stool per week. ° °One of the most common issues patients have following surgery is constipation.  Even if you have a regular bowel pattern at home, your normal regimen is likely to be disrupted due to multiple reasons following surgery.  Combination of anesthesia, postoperative narcotics, change in appetite and fluid intake all can affect your bowels.    In order to avoid complications following surgery, here are some recommendations in order to help you during your recovery period. ° °Colace (docusate) - Pick up an over-the-counter form of Colace or another stool softener and take twice a day as long as you are requiring postoperative pain medications.  Take with a full glass of water daily.  If you experience loose stools or diarrhea, hold the colace until you stool forms  back up.  If your symptoms do not get better within 1 week or if they get worse, check with your doctor. ° °Dulcolax (bisacodyl) - Pick up over-the-counter and take as directed by the product packaging as needed to assist with the movement of your bowels.  Take with a full glass of water.  Use this product as needed if not relieved by Colace only.  ° °MiraLax (polyethylene glycol) - Pick up over-the-counter to have on hand.  MiraLax is a solution that will increase the amount of water in your bowels to assist with bowel movements.  Take as directed and can mix with a glass of water, juice, soda, coffee, or tea.  Take if you go more than two days without a movement. °Do not use MiraLax more than once per day. Call your doctor if you are still constipated or irregular after using this medication for 7 days in a row. ° °If you continue to have problems with postoperative constipation, please contact the office for further assistance and recommendations.  If you experience "the worst abdominal pain ever" or develop nausea or vomiting, please contact the office immediatly for further recommendations for treatment. ° °ITCHING ° If you experience itching with your medications, try taking only a single pain pill, or even half a pain pill at a time.  You can also use Benadryl over the counter for itching or also to help with sleep.  ° °TED HOSE STOCKINGS °Wear the elastic stockings on both legs for three weeks following surgery during the day but you may remove then at night for sleeping. ° °MEDICATIONS °See your medication summary on the “After Visit Summary” that the nursing staff will review with you prior to discharge.  You may have some home medications which will be placed on hold until you complete the course of blood thinner medication.  It is important for you to complete the blood thinner medication as prescribed by your surgeon.  Continue your approved medications as instructed at time of  discharge. ° °PRECAUTIONS °If you experience chest pain or shortness of breath - call 911 immediately for transfer to the hospital emergency department.  °If you develop a fever greater that 101 F, purulent drainage from wound, increased redness or drainage from wound, foul odor from the wound/dressing, or calf pain - CONTACT YOUR SURGEON.   °                                                °FOLLOW-UP APPOINTMENTS °Make sure you keep all of your appointments after your operation with your surgeon and caregivers. You should call the office at the above phone number and make an appointment for approximately two weeks after the date of your surgery or on the date instructed by your surgeon outlined in the "After Visit Summary". ° ° °RANGE OF MOTION AND STRENGTHENING EXERCISES  °Rehabilitation of the knee is important following a knee injury or   an operation. After just a few days of immobilization, the muscles of the thigh which control the knee become weakened and shrink (atrophy). Knee exercises are designed to build up the tone and strength of the thigh muscles and to improve knee motion. Often times heat used for twenty to thirty minutes before working out will loosen up your tissues and help with improving the range of motion but do not use heat for the first two weeks following surgery. These exercises can be done on a training (exercise) mat, on the floor, on a table or on a bed. Use what ever works the best and is most comfortable for you Knee exercises include:  °Leg Lifts - While your knee is still immobilized in a splint or cast, you can do straight leg raises. Lift the leg to 60 degrees, hold for 3 sec, and slowly lower the leg. Repeat 10-20 times 2-3 times daily. Perform this exercise against resistance later as your knee gets better.  °Quad and Hamstring Sets - Tighten up the muscle on the front of the thigh (Quad) and hold for 5-10 sec. Repeat this 10-20 times hourly. Hamstring sets are done by pushing the  foot backward against an object and holding for 5-10 sec. Repeat as with quad sets.  °· Leg Slides: Lying on your back, slowly slide your foot toward your buttocks, bending your knee up off the floor (only go as far as is comfortable). Then slowly slide your foot back down until your leg is flat on the floor again. °· Angel Wings: Lying on your back spread your legs to the side as far apart as you can without causing discomfort.  °A rehabilitation program following serious knee injuries can speed recovery and prevent re-injury in the future due to weakened muscles. Contact your doctor or a physical therapist for more information on knee rehabilitation.  ° °IF YOU ARE TRANSFERRED TO A SKILLED REHAB FACILITY °If the patient is transferred to a skilled rehab facility following release from the hospital, a list of the current medications will be sent to the facility for the patient to continue.  When discharged from the skilled rehab facility, please have the facility set up the patient's Home Health Physical Therapy prior to being released. Also, the skilled facility will be responsible for providing the patient with their medications at time of release from the facility to include their pain medication, the muscle relaxants, and their blood thinner medication. If the patient is still at the rehab facility at time of the two week follow up appointment, the skilled rehab facility will also need to assist the patient in arranging follow up appointment in our office and any transportation needs. ° °MAKE SURE YOU:  °Understand these instructions.  °Get help right away if you are not doing well or get worse.  ° ° °Pick up stool softner and laxative for home use following surgery while on pain medications. °Do not submerge incision under water. °Please use good hand washing techniques while changing dressing each day. °May shower starting three days after surgery. °Please use a clean towel to pat the incision dry following  showers. °Continue to use ice for pain and swelling after surgery. °Do not use any lotions or creams on the incision until instructed by your surgeon. ° °Take Xarelto for two and a half more weeks, then discontinue Xarelto. °Once the patient has completed the blood thinner regimen, then take a Baby 81 mg Aspirin daily for three more weeks. ° ° °Information   my medicine - XARELTO® (Rivaroxaban) ° °This medication education was reviewed with me or my healthcare representative as part of my discharge preparation.  The pharmacist that spoke with me during my hospital stay was:  Christine  °Why was Xarelto® prescribed for you? °Xarelto® was prescribed for you to reduce the risk of blood clots forming after orthopedic surgery. The medical term for these abnormal blood clots is venous thromboembolism (VTE). ° °What do you need to know about xarelto® ? °Take your Xarelto® ONCE DAILY at the same time every day. °You may take it either with or without food. ° °If you have difficulty swallowing the tablet whole, you may crush it and mix in applesauce just prior to taking your dose. ° °Take Xarelto® exactly as prescribed by your doctor and DO NOT stop taking Xarelto® without talking to the doctor who prescribed the medication.  Stopping without other VTE prevention medication to take the place of Xarelto® may increase your risk of developing a clot. ° °After discharge, you should have regular check-up appointments with your healthcare provider that is prescribing your Xarelto®.   ° °What do you do if you miss a dose? °If you miss a dose, take it as soon as you remember on the same day then continue your regularly scheduled once daily regimen the next day. Do not take two doses of Xarelto® on the same day.  ° °Important Safety Information °A possible side effect of Xarelto® is bleeding. You should call your healthcare provider right away if you experience any of the following: °? Bleeding from an injury or your nose that does  not stop. °? Unusual colored urine (red or dark brown) or unusual colored stools (red or black). °? Unusual bruising for unknown reasons. °? A serious fall or if you hit your head (even if there is no bleeding). ° °Some medicines may interact with Xarelto® and might increase your risk of bleeding while on Xarelto®. To help avoid this, consult your healthcare provider or pharmacist prior to using any new prescription or non-prescription medications, including herbals, vitamins, non-steroidal anti-inflammatory drugs (NSAIDs) and supplements. ° °This website has more information on Xarelto®: www.xarelto.com. ° ° ° °

## 2015-05-28 NOTE — NC FL2 (Signed)
[place] LEVEL OF CARE SCREENING TOOL     IDENTIFICATION  Patient Name: Elizabeth Ramirez Birthdate: [birth year] Sex: female Admission Date (Current Location): 05/27/2015  [facility] and [place] Number:     Facility and Address:  [facility],  [place] [address], [place]      Provider Number: [id number]  Attending Physician Name and Address:  Gaynelle Arabian, MD  Relative Name and Phone Number:       Current Level of Care: Hospital Recommended Level of Care: [place] Prior Approval Number:    Date Approved/Denied:   PASRR Number: [id number]  Discharge Plan: SNF    Current Diagnoses: Patient Active Problem List   Diagnosis Date Noted  . OA (osteoarthritis) of knee 05/27/2015  . 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 Genetic testing 01/18/2015  . Breast cancer, right breast ([place]) 01/14/2015  . Ear infection 10/19/2013  . Hypothyroid 07/29/2011    Orientation RESPIRATION BLADDER Height & Weight    Self, Time, Situation, Place  Normal Continent 5\' 4"  (162.6 cm) 254 lbs.  BEHAVIORAL SYMPTOMS/MOOD NEUROLOGICAL BOWEL NUTRITION STATUS  Other (Comment) (no behaviors)   Continent Diet  AMBULATORY STATUS COMMUNICATION OF NEEDS Skin   Extensive Assist Verbally Surgical wounds                       Personal Care Assistance Level of Assistance  Bathing, Feeding, Dressing Bathing Assistance: Limited assistance Feeding assistance: Independent Dressing Assistance: Limited assistance     Functional Limitations Info  Sight, Hearing, Speech Sight Info: Adequate Hearing Info: Adequate Speech Info: Adequate    SPECIAL CARE FACTORS FREQUENCY  PT (By licensed PT), OT (By licensed OT)     PT Frequency: 6-7 x wk OT Frequency: 6-7 x wk            Contractures Contractures Info: Not present    Additional Factors Info  Code Status Code Status Info: Full             Current Medications (05/28/2015):  This  is the current hospital active medication list Current Facility-Administered Medications  Medication Dose Route Frequency Provider Last Rate Last Dose  . 0.9 %  sodium chloride infusion   Intravenous Continuous Gaynelle Arabian, MD 75 mL/hr at 05/28/15 0121    . acetaminophen (TYLENOL) tablet 650 mg  650 mg Oral Q6H PRN Gaynelle Arabian, MD       Or  . acetaminophen (TYLENOL) suppository 650 mg  650 mg Rectal Q6H PRN Gaynelle Arabian, MD      . bisacodyl (DULCOLAX) suppository 10 mg  10 mg Rectal Daily PRN Gaynelle Arabian, MD      . diphenhydrAMINE (BENADRYL) 12.5 MG/5ML elixir 12.5-25 mg  12.5-25 mg Oral Q4H PRN Gaynelle Arabian, MD      . docusate sodium (COLACE) capsule 100 mg  100 mg Oral BID Gaynelle Arabian, MD   100 mg at 05/28/15 1035  . guaiFENesin (MUCINEX) 12 hr tablet 600 mg  600 mg Oral Daily PRN Gaynelle Arabian, MD       And  . pseudoephedrine (SUDAFED) tablet 60 mg  60 mg Oral Daily PRN Gaynelle Arabian, MD      . ipratropium (ATROVENT) 0.03 % nasal spray 1 spray  1 spray Nasal Daily Gaynelle Arabian, MD   1 spray at 05/28/15 1036  . levothyroxine (SYNTHROID, LEVOTHROID) tablet 137 mcg  137 mcg Oral QAC breakfast Gaynelle Arabian, MD   137 mcg at 05/28/15 0523  .  menthol-cetylpyridinium (CEPACOL) lozenge 3 mg  1 lozenge Oral PRN Gaynelle Arabian, MD       Or  . phenol (CHLORASEPTIC) mouth spray 1 spray  1 spray Mouth/Throat PRN Gaynelle Arabian, MD      . methocarbamol (ROBAXIN) tablet 500 mg  500 mg Oral Q6H PRN Gaynelle Arabian, MD   500 mg at 05/28/15 0444   Or  . methocarbamol (ROBAXIN) 500 mg in dextrose 5 % 50 mL IVPB  500 mg Intravenous Q6H PRN Gaynelle Arabian, MD   500 mg at 05/28/15 1318  . metoCLOPramide (REGLAN) tablet 5-10 mg  5-10 mg Oral Q8H PRN Gaynelle Arabian, MD       Or  . metoCLOPramide (REGLAN) injection 5-10 mg  5-10 mg Intravenous Q8H PRN Gaynelle Arabian, MD   10 mg at 05/28/15 0843  . morphine 2 MG/ML injection 1 mg  1 mg Intravenous Q2H PRN Gaynelle Arabian, MD   1 mg at 05/28/15 1045  .  ondansetron (ZOFRAN) tablet 4 mg  4 mg Oral Q6H PRN Gaynelle Arabian, MD       Or  . ondansetron [facility]) injection 4 mg  4 mg Intravenous Q6H PRN Gaynelle Arabian, MD   4 mg at 05/28/15 1209  . oxyCODONE (Oxy IR/ROXICODONE) immediate release tablet 5-10 mg  5-10 mg Oral Q3H PRN Gaynelle Arabian, MD   10 mg at 05/28/15 0749  . polyethylene glycol (MIRALAX / GLYCOLAX) packet 17 g  17 g Oral Daily PRN Gaynelle Arabian, MD      . promethazine (PHENERGAN) injection 6.25-12.5 mg  6.25-12.5 mg Intravenous Q6H PRN Arlee Muslim, PA-C   6.25 mg at 05/28/15 1243  . rivaroxaban (XARELTO) tablet 10 mg  10 mg Oral Q breakfast Gaynelle Arabian, MD   10 mg at 05/28/15 1035  . sodium phosphate (FLEET) 7-19 GM/118ML enema 1 enema  1 enema Rectal Once PRN Gaynelle Arabian, MD      . traMADol Veatrice Bourbon) tablet 50-100 mg  50-100 mg Oral Q6H PRN Gaynelle Arabian, MD   100 mg at 05/28/15 1520     Discharge Medications: Please see discharge summary for a list of discharge medications.  Relevant Imaging Results:  Relevant Lab Results:   Additional Information SS # [id number]  Tekesha Almgren, Randall An, LCSW

## 2015-05-28 NOTE — Care Management Note (Signed)
Case Management Note  Patient Details  Name: ANDRIA MASLOWSKI MRN: [id number] Date of Birth: [birth year]  Subjective/Objective:  S/p Right total knee replacement                  Action/Plan: Discharge planning per CSW  Expected Discharge Date:  05/29/15               Expected Discharge Plan:  [place]  In-House Referral:  Clinical Social Work  Discharge planning Services  CM Consult  Post Acute Care Choice:  NA Choice offered to:  NA  DME Arranged:  N/A DME Agency:  NA  HH Arranged:  NA HH Agency:  NA  Status of Service:  Completed, signed off  Medicare Important Message Given:    Date Medicare IM Given:    Medicare IM give by:    Date Additional Medicare IM Given:    Additional Medicare Important Message give by:     If discussed at [place] of Stay Meetings, dates discussed:    Additional Comments:  Guadalupe Maple, RN 05/28/2015, 11:16 AM

## 2015-05-29 LAB — CBC
HEMATOCRIT: 31.9 % — AB (ref 36.0–46.0)
HEMOGLOBIN: 10.8 g/dL — AB (ref 12.0–15.0)
MCH: 30.4 pg (ref 26.0–34.0)
MCHC: 33.9 g/dL (ref 30.0–36.0)
MCV: 89.9 fL (ref 78.0–100.0)
Platelets: 137 10*3/uL — ABNORMAL LOW (ref 150–400)
RBC: 3.55 MIL/uL — ABNORMAL LOW (ref 3.87–5.11)
RDW: 14 % (ref 11.5–15.5)
WBC: 8.2 10*3/uL (ref 4.0–10.5)

## 2015-05-29 LAB — BASIC METABOLIC PANEL
Anion gap: 6 (ref 5–15)
BUN: 11 mg/dL (ref 6–20)
CHLORIDE: 105 mmol/L (ref 101–111)
CO2: 26 mmol/L (ref 22–32)
CREATININE: 0.9 mg/dL (ref 0.44–1.00)
Calcium: 8.3 mg/dL — ABNORMAL LOW (ref 8.9–10.3)
GFR calc Af Amer: >60 mL/min (ref >60)
GFR calc non Af Amer: >60 mL/min (ref >60)
Glucose, Bld: 124 mg/dL — ABNORMAL HIGH (ref 65–99)
Potassium: 3.9 mmol/L (ref 3.5–5.1)
Sodium: 137 mmol/L (ref 135–145)

## 2015-05-29 MED ORDER — OXYCODONE HCL 5 MG PO TABS: 5.0000 mg | ORAL_TABLET | ORAL | Status: DC | PRN

## 2015-05-29 MED ORDER — RIVAROXABAN 10 MG PO TABS: 10.0000 mg | ORAL_TABLET | Freq: Every day | ORAL | Status: DC

## 2015-05-29 MED ORDER — ONDANSETRON HCL 4 MG PO TABS: 4.0000 mg | ORAL_TABLET | Freq: Four times a day (QID) | ORAL | Status: DC | PRN

## 2015-05-29 MED ORDER — DOCUSATE SODIUM 100 MG PO CAPS: 100.0000 mg | ORAL_CAPSULE | Freq: Two times a day (BID) | ORAL | Status: DC

## 2015-05-29 MED ORDER — ACETAMINOPHEN 325 MG PO TABS: 650.0000 mg | ORAL_TABLET | Freq: Four times a day (QID) | ORAL | Status: DC | PRN

## 2015-05-29 MED ORDER — POLYETHYLENE GLYCOL 3350 17 G PO PACK: 17.0000 g | PACK | Freq: Every day | ORAL | Status: DC | PRN

## 2015-05-29 MED ORDER — METHOCARBAMOL 500 MG PO TABS: 500.0000 mg | ORAL_TABLET | Freq: Four times a day (QID) | ORAL | Status: DC | PRN

## 2015-05-29 MED ORDER — FLEET ENEMA 7-19 GM/118ML RE ENEM: 1.0000 | ENEMA | Freq: Once | RECTAL | Status: DC | PRN

## 2015-05-29 MED ORDER — METOCLOPRAMIDE HCL 5 MG PO TABS: 5.0000 mg | ORAL_TABLET | Freq: Three times a day (TID) | ORAL | Status: DC | PRN

## 2015-05-29 MED ORDER — TRAMADOL HCL 50 MG PO TABS: 50.0000 mg | ORAL_TABLET | Freq: Four times a day (QID) | ORAL | Status: DC | PRN

## 2015-05-29 MED ORDER — BISACODYL 10 MG RE SUPP: 10.0000 mg | Freq: Every day | RECTAL | Status: DC | PRN

## 2015-05-29 MED ORDER — DIPHENHYDRAMINE HCL 12.5 MG/5ML PO ELIX: 12.5000 mg | ORAL_SOLUTION | ORAL | Status: DC | PRN

## 2015-05-29 NOTE — Progress Notes (Signed)
Physical Therapy Treatment Patient Details Name: Elizabeth Ramirez MRN: [id number] DOB: 11-28-1945 Today's Date: 05/29/2015    History of Present Illness pt s/p R TKA     PT Comments    POD # 2 am session.  Applied KI and assisted OOB to amb a greater distance.  Assisted to [facility] then to recliner.  Pt plans to D/C to SNF.  Follow Up Recommendations  Home health PT     Equipment Recommendations  Rolling walker with 5" wheels    Recommendations for Other Services       Precautions / Restrictions Precautions Precaution Comments: instructed on KI use for amb and stairs Required Braces or Orthoses: Knee Immobilizer - Right Knee Immobilizer - Right: Discontinue once straight leg raise with < 10 degree lag Restrictions Weight Bearing Restrictions: No RLE Weight Bearing: Weight bearing as tolerated    Mobility  Bed Mobility Overal bed mobility: Needs Assistance Bed Mobility: Supine to Sit     Supine to sit: Mod assist;Min assist     General bed mobility comments: assist with upper body and support r LE  Transfers Overall transfer level: Needs assistance Equipment used: Rolling walker (2 wheeled) Transfers: Sit to/from Stand Sit to Stand: Min assist         General transfer comment: 25% VC's on proper tech and hand placement  Ambulation/Gait Ambulation/Gait assistance: Min assist   Assistive device: Rolling walker (2 wheeled) Gait Pattern/deviations: Step-to pattern;Decreased stance time - right;Trunk flexed Gait velocity: decreased   General Gait Details: 50% VC's on proper sequencing, proper walker to self placement, upright posture and increased time. Mild c/o nausea.  Tolerated increased distance.   Stairs            Wheelchair Mobility    Modified Rankin (Stroke Patients Only)       Balance                                    Cognition Arousal/Alertness: Awake/alert Behavior During Therapy: WFL for tasks  assessed/performed Overall Cognitive Status: Within Functional Limits for tasks assessed                      Exercises      General Comments        Pertinent Vitals/Pain Pain Assessment: 0-10 Pain Score: 5  Pain Location: R knee Pain Descriptors / Indicators: Discomfort;Sore;Tender Pain Intervention(s): Premedicated before session;Repositioned;Ice applied;Monitored during session    Home Living                      Prior Function            PT Goals (current goals can now be found in the care plan section) Progress towards PT goals: Progressing toward goals    Frequency  7X/week    PT Plan Current plan remains appropriate    Co-evaluation             End of Session Equipment Utilized During Treatment: Right knee immobilizer Activity Tolerance: Patient tolerated treatment well Patient left: in chair;with call bell/phone within reach;with family/visitor present     Time: 1130-1155 PT Time Calculation (min) (ACUTE ONLY): 25 min  Charges:  $Gait Training: 8-22 mins $Therapeutic Activity: 8-22 mins                    G Codes:      Cecille Rubin  Aliha Diedrich  PTA [facility]  Acute  Rehab Pager      [phone]

## 2015-05-29 NOTE — Progress Notes (Signed)
   Subjective: 2 Days Post-Op Procedure(s) (LRB): RIGHT TOTAL KNEE ARTHROPLASTY (Right) Patient reports pain as mild.   Patient seen in rounds with Dr. Wynelle Link. Plan is to go to the SNF but running into some insurance issues.  Social worker, IT consultant, working on issue.  If gets resolved, possible transfer over to SNF later today.  Will wait and see. Patient is well, but has had some minor complaints of pain in the knee, requiring pain medications Patient possible transfer tot SNF later today.  Continue therapy at this time.  Objective: Vital signs in last 24 hours: Temp:  [97.9 F (36.6 C)-98.7 F (37.1 C)] 98.7 F (37.1 C) (12/14 0420) Pulse Rate:  [80-97] 92 (12/14 0420) Resp:  [15-16] 15 (12/14 0420) BP: (101-110)/(48-59) 101/54 mmHg (12/14 0420) SpO2:  [95 %-100 %] 95 % (12/14 0420)  Intake/Output from previous day:  Intake/Output Summary (Last 24 hours) at 05/29/15 0756 Last data filed at 05/28/15 2125  Gross per 24 hour  Intake 1343.75 ml  Output   1450 ml  Net -106.25 ml    Labs:  Recent Labs  05/28/15 0459 05/29/15 0405  HGB 11.6* 10.8*    Recent Labs  05/28/15 0459 05/29/15 0405  WBC 9.1 8.2  RBC 3.87 3.55*  HCT 34.3* 31.9*  PLT 170 137*    Recent Labs  05/28/15 0459 05/29/15 0405  NA 135 137  K 3.7 3.9  CL 104 105  CO2 25 26  BUN 11 11  CREATININE 0.76 0.90  GLUCOSE 130* 124*  CALCIUM 8.6* 8.3*   No results for input(s): LABPT, INR in the last 72 hours.  EXAM: General - Patient is Alert, Appropriate and Oriented Extremity - Neurovascular intact Sensation intact distally Dorsiflexion/Plantar flexion intact Incision - clean, dry, no drainage Motor Function - intact, moving foot and toes well on exam.   Assessment/Plan: 2 Days Post-Op Procedure(s) (LRB): RIGHT TOTAL KNEE ARTHROPLASTY (Right) Procedure(s) (LRB): RIGHT TOTAL KNEE ARTHROPLASTY (Right) Past Medical History  Diagnosis Date  . Thyroid disease   . Allergy     tetracycline,  levofloxacin, crabs & bay leaves  . Hypothyroidism   . Arthritis     Rt knee, Rt shoulder  . PONV (postoperative nausea and vomiting)   . Seasonal allergies   . History of kidney stones     x1 '71  . Cancer ([place])   . 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   Weight as of this encounter: 115.214 kg (254 lb). Up with therapy Discharge to SNF if insurance comes through Diet - Regular diet Follow up - in 2 weeks Activity - WBAT Disposition - Pending Condition Upon Discharge - Pending D/C Meds - See DC Summary DVT Prophylaxis - Xarelto  Arlee Muslim, PA-C Orthopaedic Surgery 05/29/2015, 7:56 AM

## 2015-05-29 NOTE — Progress Notes (Signed)
Physical Therapy Treatment Patient Details Name: Elizabeth Ramirez MRN: [id number] DOB: 1945/06/24 Today's Date: 05/29/2015    History of Present Illness pt s/p R TKA     PT Comments    POD # 2 pm session.  Assisted with amb a greater distance in hallway.  Assisted to [facility] then back to bed to perform TKR TE's followed by ICE.  Follow Up Recommendations  Home health PT     Equipment Recommendations  Rolling walker with 5" wheels    Recommendations for Other Services       Precautions / Restrictions Precautions Precautions: Knee Precaution Comments: instructed on KI use for amb and stairs Required Braces or Orthoses: Knee Immobilizer - Right Knee Immobilizer - Right: Discontinue once straight leg raise with < 10 degree lag Restrictions Weight Bearing Restrictions: No RLE Weight Bearing: Weight bearing as tolerated    Mobility  Bed Mobility Overal bed mobility: Needs Assistance Bed Mobility: Supine to Sit     Supine to sit: Mod assist;Min assist     General bed mobility comments: assist with upper body and support r LE  Transfers Overall transfer level: Needs assistance Equipment used: Rolling walker (2 wheeled) Transfers: Sit to/from Stand Sit to Stand: Min assist         General transfer comment: 25% VC's on proper tech and hand placement  Ambulation/Gait Ambulation/Gait assistance: Min assist Ambulation Distance (Feet): 25 Feet Assistive device: Rolling walker (2 wheeled) Gait Pattern/deviations: Step-to pattern;Decreased stance time - right;Trunk flexed Gait velocity: decreased   General Gait Details: 50% VC's on proper sequencing, proper walker to self placement, upright posture and increased time. Mild c/o nausea.  Tolerated increased distance.   Stairs            Wheelchair Mobility    Modified Rankin (Stroke Patients Only)       Balance                                    Cognition Arousal/Alertness:  Awake/alert Behavior During Therapy: WFL for tasks assessed/performed Overall Cognitive Status: Within Functional Limits for tasks assessed                      Exercises   Total Knee Replacement TE's 10 reps B LE ankle pumps 10 reps towel squeezes 10 reps knee presses 10 reps heel slides  10 reps SAQ's 10 reps SLR's 10 reps ABD Followed by ICE     General Comments        Pertinent Vitals/Pain Pain Assessment: 0-10 Pain Score: 5  Pain Location: R knee Pain Descriptors / Indicators: Discomfort;Sore;Tender Pain Intervention(s): Premedicated before session;Repositioned;Ice applied;Monitored during session    Home Living                      Prior Function            PT Goals (current goals can now be found in the care plan section) Progress towards PT goals: Progressing toward goals    Frequency  7X/week    PT Plan Current plan remains appropriate    Co-evaluation             End of Session Equipment Utilized During Treatment: Right knee immobilizer Activity Tolerance: Patient tolerated treatment well Patient left: in chair;with call bell/phone within reach;with family/visitor present     Time: 1415-1440 PT Time Calculation (min) (ACUTE ONLY):  25 min  Charges:  $Gait Training: 8-22 mins $Therapeutic Exercise: 8-22 mins                     G Codes:      Rica Koyanagi  PTA [facility]  Acute  Rehab Pager      [phone]

## 2015-05-29 NOTE — Clinical Social Work Placement (Signed)
   CLINICAL SOCIAL WORK PLACEMENT  NOTE  Date:  05/29/2015  Patient Details  Name: Elizabeth Ramirez MRN: [id number] Date of Birth: 1946-06-12  Clinical Social Work is seeking post-discharge placement for this patient at the [place] level of care (*CSW will initial, date and re-position this form in  chart as items are completed):  No   Patient/family provided with [place] Work Department's list of facilities offering this level of care within the geographic area requested by the patient (or if unable, by the patient's family).  Yes   Patient/family informed of their freedom to choose among providers that offer the needed level of care, that participate in Medicare, Medicaid or managed care program needed by the patient, have an available bed and are willing to accept the patient.  No   Patient/family informed of [facility]'s ownership interest in [facility] and [facility], as well as of the fact that they are under no obligation to receive care at these facilities.  PASRR submitted to EDS on 05/28/15     PASRR number received on 05/28/15     Existing PASRR number confirmed on       FL2 transmitted to all facilities in geographic area requested by pt/family on 05/28/15     FL2 transmitted to all facilities within larger geographic area on       Patient informed that his/her managed care company has contracts with or will negotiate with certain facilities, including the following:        Yes   Patient/family informed of bed offers received.  Patient [facility] bed at [facility]     Physician recommends and patient chooses bed at [facility]    Patient to be transferred to [facility] on 05/29/15.  Patient to be transferred to facility by [facility]     Patient family notified on 05/29/15 of transfer.  Name of family member notified:  Pt will notife family directly.     PHYSICIAN       Additional Comment: Pt is in agreement with  d/c to [facility] today. [employer] has provided authorization. Prior outstanding claim issue has been resolved. [facility] transport is required . Medical necessity form completed. Pt is aware out of pocket cost may be associated with [facility] transport. D/C Summary, scripts, avs reviewed by nsg. Scripts included in d/c packet. D/C Summary sent to SNF for review prior to d/c.   _______________________________________________ Luretha Rued, [place] 05/29/2015, 2:45 PM

## 2015-05-31 ENCOUNTER — Non-Acute Institutional Stay (SKILLED_NURSING_FACILITY): Payer: No Typology Code available for payment source | Admitting: Internal Medicine

## 2015-05-31 DIAGNOSIS — K5900 Constipation, unspecified: Secondary | ICD-10-CM | POA: Diagnosis not present

## 2015-05-31 DIAGNOSIS — E039 Hypothyroidism, unspecified: Secondary | ICD-10-CM

## 2015-05-31 DIAGNOSIS — R2681 Unsteadiness on feet: Secondary | ICD-10-CM | POA: Diagnosis not present

## 2015-05-31 DIAGNOSIS — M1711 Unilateral primary osteoarthritis, right knee: Secondary | ICD-10-CM | POA: Diagnosis not present

## 2015-05-31 DIAGNOSIS — C50911 Malignant neoplasm of unspecified site of right female breast: Secondary | ICD-10-CM

## 2015-05-31 DIAGNOSIS — D62 Acute posthemorrhagic anemia: Secondary | ICD-10-CM | POA: Diagnosis not present

## 2015-06-01 NOTE — Progress Notes (Signed)
Patient ID: Elizabeth Ramirez, female   DOB: 1946/03/27, 69 y.o.   MRN: [id number]     [facility]   PCP: [facility], MD  Code Status: full code  Allergies  Allergen Reactions  . Levofloxacin Other (See Comments)    Affected tendons, severe pain and swelling  . Tetracyclines & Related Swelling    tongue swelling and peeling  . Other Diarrhea and Rash    Crabs - diarrhea. Bay leaves - rash in her mouth  . Flonase [Fluticasone]     Nasal spray cause nose bleeds  . Nitrofurantoin Monohyd Macro Hives  . Scopolamine     "Headache and redness of eyes"  . Tramadol     Chief Complaint  Patient presents with  . New Admit To SNF     HPI:  69 y.o. patient is here for short term rehabilitation post hospital admission from 05/27/15-05/29/15 with right knee OA. She underwent right total knee arthroplasty. She is seen in her room today. She has experienced some mouth sores with tramadol and wants this discontinued. She has been constipated. She has been working with therapy. She has only been taking tylenol for pain 650 mg as needed and this is not helping her. She has not taken oxyIR with concern for drug dependence and constipation.  Review of Systems:  Constitutional: Negative for fever, chills, diaphoresis.  HENT: Negative for headache, congestion, nasal discharge, difficulty swallowing.   Eyes: Negative for eye pain, blurred vision, discharge.  Respiratory: Negative for cough, shortness of breath and wheezing.   Cardiovascular: Negative for chest pain, palpitations, leg swelling.  Gastrointestinal: Negative for heartburn, nausea, vomiting, abdominal pain. Genitourinary: Negative for dysuria and flank pain.  Musculoskeletal: Negative for back pain, falls. Skin: Negative for itching, rash.  Neurological: Negative for dizziness, tingling, focal weakness Psychiatric/Behavioral: Negative for depression    Past Medical History  Diagnosis Date  .  Thyroid disease   . Allergy     tetracycline, levofloxacin, crabs & bay leaves  . Hypothyroidism   . Arthritis     Rt knee, Rt shoulder  . PONV (postoperative nausea and vomiting)   . Seasonal allergies   . History of kidney stones     x1 '71  . Cancer ([place])   . 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 Breast lumpectomy Right 09/2003    DCIS  . Appendectomy  08/23/2006  . Colonoscopy with biopsy  12/28/2003    neg  . Tonsillectomy      1954  . Wisdom tooth extraction Bilateral 1965  . Breast lumpectomy with needle localization Right 02/11/2015    Procedure: WIRE BRACKETED RIGHT BREAST LUMPECTOMY ;  Surgeon: Excell Seltzer, MD;  Location: [facility];  Service: General;  Laterality: Right;  . Total knee arthroplasty Right 05/27/2015    Procedure: RIGHT TOTAL KNEE ARTHROPLASTY;  Surgeon: Gaynelle Arabian, MD;  Location: [facility];  Service: Orthopedics;  Laterality: Right;   Social History:   reports that she quit smoking about 31 years ago. Her smoking use included Cigarettes. She has a 9 pack-year smoking history. She has never used smokeless tobacco. She reports that she drinks about 4.2 oz of alcohol per week. She reports that she does not use illicit drugs.  Family History  Problem Relation Age of Onset  . Dementia Mother   .  Other Mother 74    "spot on lung"; secondhand smoke exposure  . Pulmonary fibrosis Father   . Diabetes Brother   . Thyroid disease Brother   . Leukemia Brother 70    chronic lymphocytic   . Stomach cancer Maternal Grandmother     dx. 73s  . Heart attack Maternal Grandfather   . Diabetes Paternal Grandfather   . Stroke Paternal Grandfather   . Parkinson's disease Maternal Aunt   . Diabetes Paternal Aunt   . Other Paternal Aunt     bowel obstruction  . Heart Problems Paternal Uncle   .  Ovarian cancer Maternal Aunt     dx. 64s; paternal half sister of her mother  . Multiple myeloma Maternal Aunt   . Multiple myeloma Maternal Aunt 75  . Kidney failure Paternal Aunt   . Pulmonary fibrosis Paternal Aunt   . Stroke Paternal Aunt   . Heart Problems Paternal Aunt   . Emphysema Paternal Uncle   . Heart Problems Paternal Uncle   . Lung cancer Paternal Uncle     smoker  . Colon cancer Cousin   . Breast cancer Cousin     dx. 50s    Medications:   Medication List       This list is accurate as of: 05/31/15 11:59 PM.  Always use your most recent med list.               acetaminophen 325 MG tablet  Commonly known as:  TYLENOL  Take 2 tablets (650 mg total) by mouth every 6 (six) hours as needed for mild pain (or Fever >/= 101).     anastrozole 1 MG tablet  Commonly known as:  ARIMIDEX  Take 1 tablet (1 mg total) by mouth daily.     bisacodyl 10 MG suppository  Commonly known as:  DULCOLAX  Place 1 suppository (10 mg total) rectally daily as needed for moderate constipation.     diphenhydrAMINE 12.5 MG/5ML elixir  Commonly known as:  BENADRYL  Take 5-10 mLs (12.5-25 mg total) by mouth every 4 (four) hours as needed for itching.     docusate sodium 100 MG capsule  Commonly known as:  COLACE  Take 1 capsule (100 mg total) by mouth 2 (two) times daily.     ipratropium 0.03 % nasal spray  Commonly known as:  ATROVENT  Place 2 sprays into the nose every 12 (twelve) hours.     levothyroxine 137 MCG tablet  Commonly known as:  SYNTHROID, LEVOTHROID  Take 137 mcg by mouth daily before breakfast.     methocarbamol 500 MG tablet  Commonly known as:  ROBAXIN  Take 1 tablet (500 mg total) by mouth every 6 (six) hours as needed for muscle spasms.     metoCLOPramide 5 MG tablet  Commonly known as:  REGLAN  Take 1 tablet (5 mg total) by mouth every 8 (eight) hours as needed for nausea (if ondansetron (ZOFRAN) ineffective.).     ondansetron 4 MG tablet  Commonly  known as:  ZOFRAN  Take 1 tablet (4 mg total) by mouth every 6 (six) hours as needed for nausea.     oxyCODONE 5 MG immediate release tablet  Commonly known as:  Oxy IR/ROXICODONE  Take 1-2 tablets (5-10 mg total) by mouth every 3 (three) hours as needed for moderate pain or severe pain.     polyethylene glycol packet  Commonly known as:  MIRALAX / GLYCOLAX  Take 17 g by mouth daily  as needed for mild constipation.     pseudoephedrine-guaifenesin 60-600 MG 12 hr tablet  Commonly known as:  MUCINEX D  Take 1 tablet by mouth daily as needed for congestion (allergies).     rivaroxaban 10 MG Tabs tablet  Commonly known as:  XARELTO  Take 1 tablet (10 mg total) by mouth daily with breakfast. Take Xarelto for two and a half more weeks, then discontinue Xarelto. Once the patient has completed the blood thinner regimen, then take a Baby 81 mg Aspirin daily for three more weeks.     sodium phosphate 7-19 GM/118ML Enem  Place 133 mLs (1 enema total) rectally once as needed for moderate constipation or severe constipation.     traMADol 50 MG tablet  Commonly known as:  ULTRAM  Take 1-2 tablets (50-100 mg total) by mouth every 6 (six) hours as needed (mild pain).     valACYclovir 1000 MG tablet  Commonly known as:  VALTREX  Take 2 g by mouth See admin instructions. Take 2g at onset of cold sore symptoms, repeat after 12 hours two times.         Physical Exam: Filed Vitals:   05/31/15 1730  BP: 131/68  Pulse: 88  Temp: 98.8 F (37.1 C)  Resp: 16  SpO2: 97%    General- elderly female, obese, in no acute distress Head- normocephalic, atraumatic Nose- normal nasal mucosa, no maxillary or frontal sinus tenderness, no nasal discharge Throat- moist mucus membrane Eyes- PERRLA, EOMI, no pallor, no icterus, no discharge, normal conjunctiva, normal sclera Neck- no cervical lymphadenopathy Cardiovascular- normal s1,s2, no murmurs, palpable dorsalis pedis and radial pulses, trace leg  edema Respiratory- bilateral clear to auscultation, no wheeze, no rhonchi, no crackles, no use of accessory muscles Abdomen- bowel sounds present, soft, non tender Musculoskeletal- able to move all 4 extremities, right knee limited range of motion, unsteady gait Neurological- no focal deficit, alert and oriented to person, place and time Skin- warm and dry, right knee surgical incision with steristrip in place, mild erythema present Psychiatry- normal mood and affect    Labs reviewed: Basic Metabolic Panel:  Recent Labs  05/20/15 1152 05/28/15 0459 05/29/15 0405  NA 139 135 137  K 4.2 3.7 3.9  CL 104 104 105  CO2 '28 25 26  ' GLUCOSE 101* 130* 124*  BUN '18 11 11  ' CREATININE 1.00 0.76 0.90  CALCIUM 9.2 8.6* 8.3*   Liver Function Tests:  Recent Labs  05/20/15 1152  AST 21  ALT 24  ALKPHOS 82  BILITOT 1.6*  PROT 6.8  ALBUMIN 4.1   No results for input(s): LIPASE, AMYLASE in the last 8760 hours. No results for input(s): AMMONIA in the last 8760 hours. CBC:  Recent Labs  05/20/15 1152 05/28/15 0459 05/29/15 0405  WBC 6.2 9.1 8.2  HGB 14.5 11.6* 10.8*  HCT 42.6 34.3* 31.9*  MCV 89.3 88.6 89.9  PLT 179 170 137*     Assessment/Plan  Unsteady gait Post right knee arthroplasty. Will have patient work with PT/OT as tolerated to regain strength and restore function.  Fall precautions are in place.  Right knee OA S/p right total knee arthroplasty. Will have her work with physical therapy and occupational therapy team to help with gait training and muscle strengthening exercises.fall precautions. Skin care. Encourage to be out of bed. Has f/u with orthopedics. Discontinue tramadol and list it in her allergies. Start tylenol extra strength 500 mg 2 tab bid for now and continue tylenol 650 mg q6h  prn for mild pain. Change her oxycodone IR to 5 mg 1-2 tab q6h prn moderate to severe pain and monitor. WBAT. Continue to wear ted hose. Continue robaxin 500 mg q6h prn muscle  spasm. Have reviewed pain regimen with the patient and she voices understanding this and will ask for it if needed. Continue xarelto for dvt prophylaxis  Blood loss anemia Post op, monitor cbc  Constipation Add prunes with lunch. Encouraged hydration. Discontinue colace and start senna s 2 tab qhs with miralax daily and monitor  Hypothyroidism Continue synthroid 137 mcg daily  history of right breast cancer Continue anastrozole for now  Goals of care: short term rehabilitation   Labs/tests ordered: cbc  Family/ staff Communication: reviewed care plan with patient and nursing supervisor    Blanchie Serve, MD  [place] [phone] (Monday-Friday 8 am - 5 pm) [phone] (afterhours)

## 2015-06-04 ENCOUNTER — Non-Acute Institutional Stay (SKILLED_NURSING_FACILITY): Payer: No Typology Code available for payment source | Admitting: Adult Health

## 2015-06-04 DIAGNOSIS — R2681 Unsteadiness on feet: Secondary | ICD-10-CM | POA: Diagnosis not present

## 2015-06-04 DIAGNOSIS — K5900 Constipation, unspecified: Secondary | ICD-10-CM | POA: Diagnosis not present

## 2015-06-04 DIAGNOSIS — D62 Acute posthemorrhagic anemia: Secondary | ICD-10-CM

## 2015-06-04 DIAGNOSIS — C50911 Malignant neoplasm of unspecified site of right female breast: Secondary | ICD-10-CM

## 2015-06-04 DIAGNOSIS — M1711 Unilateral primary osteoarthritis, right knee: Secondary | ICD-10-CM | POA: Diagnosis not present

## 2015-06-04 DIAGNOSIS — E039 Hypothyroidism, unspecified: Secondary | ICD-10-CM

## 2015-06-05 DIAGNOSIS — R030 Elevated blood-pressure reading, without diagnosis of hypertension: Secondary | ICD-10-CM | POA: Diagnosis not present

## 2015-06-05 DIAGNOSIS — M1990 Unspecified osteoarthritis, unspecified site: Secondary | ICD-10-CM | POA: Diagnosis not present

## 2015-06-05 DIAGNOSIS — Z96651 Presence of right artificial knee joint: Secondary | ICD-10-CM | POA: Diagnosis not present

## 2015-06-05 DIAGNOSIS — Z471 Aftercare following joint replacement surgery: Secondary | ICD-10-CM | POA: Diagnosis not present

## 2015-06-05 DIAGNOSIS — Z79891 Long term (current) use of opiate analgesic: Secondary | ICD-10-CM | POA: Diagnosis not present

## 2015-06-05 DIAGNOSIS — Z7901 Long term (current) use of anticoagulants: Secondary | ICD-10-CM | POA: Diagnosis not present

## 2015-06-06 DIAGNOSIS — Z7901 Long term (current) use of anticoagulants: Secondary | ICD-10-CM | POA: Diagnosis not present

## 2015-06-06 DIAGNOSIS — Z471 Aftercare following joint replacement surgery: Secondary | ICD-10-CM | POA: Diagnosis not present

## 2015-06-06 DIAGNOSIS — Z96651 Presence of right artificial knee joint: Secondary | ICD-10-CM | POA: Diagnosis not present

## 2015-06-06 DIAGNOSIS — M1990 Unspecified osteoarthritis, unspecified site: Secondary | ICD-10-CM | POA: Diagnosis not present

## 2015-06-06 DIAGNOSIS — R030 Elevated blood-pressure reading, without diagnosis of hypertension: Secondary | ICD-10-CM | POA: Diagnosis not present

## 2015-06-06 DIAGNOSIS — Z79891 Long term (current) use of opiate analgesic: Secondary | ICD-10-CM | POA: Diagnosis not present

## 2015-06-07 DIAGNOSIS — Z96651 Presence of right artificial knee joint: Secondary | ICD-10-CM | POA: Diagnosis not present

## 2015-06-07 DIAGNOSIS — M1990 Unspecified osteoarthritis, unspecified site: Secondary | ICD-10-CM | POA: Diagnosis not present

## 2015-06-07 DIAGNOSIS — R030 Elevated blood-pressure reading, without diagnosis of hypertension: Secondary | ICD-10-CM | POA: Diagnosis not present

## 2015-06-07 DIAGNOSIS — Z7901 Long term (current) use of anticoagulants: Secondary | ICD-10-CM | POA: Diagnosis not present

## 2015-06-07 DIAGNOSIS — Z79891 Long term (current) use of opiate analgesic: Secondary | ICD-10-CM | POA: Diagnosis not present

## 2015-06-07 DIAGNOSIS — Z471 Aftercare following joint replacement surgery: Secondary | ICD-10-CM | POA: Diagnosis not present

## 2015-06-11 ENCOUNTER — Other Ambulatory Visit: Payer: Self-pay | Admitting: Adult Health

## 2015-06-11 DIAGNOSIS — Z96651 Presence of right artificial knee joint: Secondary | ICD-10-CM | POA: Diagnosis not present

## 2015-06-11 DIAGNOSIS — Z471 Aftercare following joint replacement surgery: Secondary | ICD-10-CM | POA: Diagnosis not present

## 2015-06-11 DIAGNOSIS — M1990 Unspecified osteoarthritis, unspecified site: Secondary | ICD-10-CM | POA: Diagnosis not present

## 2015-06-11 DIAGNOSIS — Z7901 Long term (current) use of anticoagulants: Secondary | ICD-10-CM | POA: Diagnosis not present

## 2015-06-11 DIAGNOSIS — Z79891 Long term (current) use of opiate analgesic: Secondary | ICD-10-CM | POA: Diagnosis not present

## 2015-06-11 DIAGNOSIS — R030 Elevated blood-pressure reading, without diagnosis of hypertension: Secondary | ICD-10-CM | POA: Diagnosis not present

## 2015-06-14 DIAGNOSIS — Z96651 Presence of right artificial knee joint: Secondary | ICD-10-CM | POA: Diagnosis not present

## 2015-06-14 DIAGNOSIS — R030 Elevated blood-pressure reading, without diagnosis of hypertension: Secondary | ICD-10-CM | POA: Diagnosis not present

## 2015-06-14 DIAGNOSIS — Z471 Aftercare following joint replacement surgery: Secondary | ICD-10-CM | POA: Diagnosis not present

## 2015-06-14 DIAGNOSIS — Z7901 Long term (current) use of anticoagulants: Secondary | ICD-10-CM | POA: Diagnosis not present

## 2015-06-14 DIAGNOSIS — M1990 Unspecified osteoarthritis, unspecified site: Secondary | ICD-10-CM | POA: Diagnosis not present

## 2015-06-14 DIAGNOSIS — Z79891 Long term (current) use of opiate analgesic: Secondary | ICD-10-CM | POA: Diagnosis not present

## 2015-07-04 ENCOUNTER — Ambulatory Visit (HOSPITAL_BASED_OUTPATIENT_CLINIC_OR_DEPARTMENT_OTHER): Payer: BLUE CROSS/BLUE SHIELD | Admitting: Nurse Practitioner

## 2015-07-04 ENCOUNTER — Encounter: Payer: Self-pay | Admitting: Nurse Practitioner

## 2015-07-04 VITALS — BP 128/78 | HR 85 | Temp 97.9°F | Resp 18 | Ht 64.5 in | Wt 258.8 lb

## 2015-07-04 DIAGNOSIS — C50911 Malignant neoplasm of unspecified site of right female breast: Secondary | ICD-10-CM

## 2015-07-04 DIAGNOSIS — Z853 Personal history of malignant neoplasm of breast: Secondary | ICD-10-CM | POA: Diagnosis not present

## 2015-07-04 DIAGNOSIS — D0511 Intraductal carcinoma in situ of right breast: Secondary | ICD-10-CM | POA: Diagnosis not present

## 2015-07-04 NOTE — Progress Notes (Signed)
CLINIC:  Cancer Survivorship   REASON FOR VISIT:  Routine follow-up post-treatment for a recent history of breast cancer.  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   12/21/2014 Initial Biopsy Right breast core needle bx: DCIS with necrosis and calcifications, grade 2-3, ER+ (95%), PR+ (60%)   01/01/2015 Procedure Breast/Ovarian Gene panel ([facility]) reveals no clinically significant variant at ATM, BARD1, BRCA1, BRCA2, BRIP1, CDH1, CHEK2, FANCC, MLH1, MSH2, MSH6, NBN, PALB2, PMS2, PTEN, RAD51C, RAD51D, TP53, and XRCC2   02/01/2015 Breast MRI 1.1 cm area of non mass enhancement in the right 12:30 o'clock breast corresponding to the site of biopsy-proven DCIS.    02/01/2015 Clinical Stage Stage 0: Tis N0   02/11/2015 Definitive Surgery Right breast lumpectomy ([facility]): DCIS, intermediate grade, with comedonecrosis and associated calcifications   02/11/2015 Pathologic Stage Stage 0: Tis Nx   03/07/2015 - 04/04/2015 Radiation Therapy Adjuvant RT Pablo Ledger): Right breast/ 42.5 Gy at 2.5 Gy per fraction x 17 fractions.  Right breast boost/ 7.5 Gy at 2.5 Gy per fraction x 3 fractions    Anti-estrogen oral therapy Anastrozole 1 mg daily to resume 07/07/2015 (one month following knee surgery)    INTERVAL HISTORY:  Elizabeth Ramirez presents to the [facility] today for our initial meeting to review her survivorship care plan detailing her treatment course for breast cancer, as well as monitoring long-term side effects of that treatment, education regarding health maintenance, screening, and overall wellness and health promotion.     Overall, Elizabeth Ramirez reports feeling quite well  since completing her radiation therapy approximately two and a half months ago.  She denies fatigue and reports the minimal skin changes overlying her right breast have resolved  She denies any headache, cough, or shortness of breath.  She has a good appetite and denies any weight loss.  She has had no change in her breast aside from some thickening post radiation in her right breast. She is currently not taking her anastrozole having stopped it for her total right knee arthroplasty which she underwent last month. She is recovering well from that with some continued pain, which is manageable.  She continues to use a cane.  She will resume her anastrozole next week (07/07/2015).  REVIEW OF SYSTEMS:  General: Denies fever, chills, unintentional weight loss, or generalized fatigue.  HEENT: Denies visual changes, hearing loss, mouth sores or difficulty swallowing. Cardiac: Denies palpitations, chest pain, and lower extremity edema.  Respiratory: Denies wheeze or dyspnea on exertion.  Breast: Denies any new nodularity, masses, tenderness, nipple changes, or nipple discharge.  GI: Denies abdominal pain, constipation, diarrhea, nausea, or vomiting.  GU: Denies dysuria, hematuria, vaginal bleeding, vaginal discharge, or vaginal dryness.  Musculoskeletal: Pain in her right knee S/P surgery. Neuro: Denies recent fall or numbness / tingling in her extremities. Skin: Denies rash, pruritis, or open wounds.  Psych: Insomnia. Denies depression, anxiety, or memory loss.   A 14-point review of systems was completed and was negative, except as noted above.   ONCOLOGY TREATMENT TEAM:  1. Surgeon:  Dr. Excell Seltzer at [facility]  2. Medical Oncologist: Dr. [name] 3. Radiation Oncologist: Dr. Pablo Ledger    PAST MEDICAL/SURGICAL HISTORY:  Past Medical History  Diagnosis Date  .  Thyroid disease   . Allergy     tetracycline, levofloxacin, crabs & bay leaves  . Hypothyroidism   . Arthritis     Rt  knee, Rt shoulder  . PONV (postoperative nausea and vomiting)   . Seasonal allergies   . History of kidney stones     x1 '71  . Cancer ([place])   . Breast cancer ([place]) 2016; 2005    Separate primaries; 2016 - DCIS of R breast(surgery and radiation-last 04-04-15); ER/PR+ April 2005   Past Surgical History  Procedure Laterality Date  . Breast lumpectomy Right 09/2003    DCIS  . Appendectomy  08/23/2006  . Colonoscopy with biopsy  12/28/2003    neg  . Tonsillectomy      1954  . Wisdom tooth extraction Bilateral 1965  . Breast lumpectomy with needle localization Right 02/11/2015    Procedure: WIRE BRACKETED RIGHT BREAST LUMPECTOMY ;  Surgeon: Excell Seltzer, MD;  Location: [facility];  Service: General;  Laterality: Right;  . Total knee arthroplasty Right 05/27/2015    Procedure: RIGHT TOTAL KNEE ARTHROPLASTY;  Surgeon: Gaynelle Arabian, MD;  Location: [facility];  Service: Orthopedics;  Laterality: Right;     ALLERGIES:  Allergies  Allergen Reactions  . Levofloxacin Other (See Comments)    Affected tendons, severe pain and swelling  . Tetracyclines & Related Swelling    tongue swelling and peeling  . Other Diarrhea and Rash    Crabs - diarrhea. Bay leaves - rash in her mouth  . Flonase [Fluticasone]     Nasal spray cause nose bleeds  . Nitrofurantoin Monohyd Macro Hives  . Scopolamine     "Headache and redness of eyes"  . Tramadol      CURRENT MEDICATIONS:  Current Outpatient Prescriptions on File Prior to Visit  Medication Sig Dispense Refill  . acetaminophen (TYLENOL) 325 MG tablet Take 2 tablets (650 mg total) by mouth every 6 (six) hours as needed for mild pain (or Fever >/= 101). 40 tablet 0  . ipratropium (ATROVENT) 0.03 % nasal spray Place 2 sprays into the nose every 12 (twelve) hours. (Patient taking differently: Place 1 spray into the nose daily. ) 30 mL 0  . levothyroxine (SYNTHROID, LEVOTHROID) 137 MCG tablet Take 137 mcg by mouth daily before breakfast.     . anastrozole (ARIMIDEX) 1 MG tablet Take 1 tablet (1 mg total) by mouth daily. (Patient not taking: Reported on 07/04/2015) 90 tablet 4  . docusate sodium (COLACE) 100 MG capsule Take 1 capsule (100 mg total) by mouth 2 (two) times daily. (Patient not taking: Reported on 07/04/2015) 10 capsule 0  . oxyCODONE (OXY IR/ROXICODONE) 5 MG immediate release tablet Take 1-2 tablets (5-10 mg total) by mouth every 3 (three) hours as needed for moderate pain or severe pain. (Patient not taking: Reported on 07/04/2015) 80 tablet 0  . traMADol (ULTRAM) 50 MG tablet Take 1-2 tablets (50-100 mg total) by mouth every 6 (six) hours as needed (mild pain). (Patient not taking: Reported on 07/04/2015) 80 tablet 1  . valACYclovir (VALTREX) 1000 MG tablet Take 2 g by mouth See admin instructions. Reported on 07/04/2015  3   No current facility-administered medications on file prior to visit.     ONCOLOGIC FAMILY HISTORY:  Family History  Problem Relation Age of Onset  . Dementia Mother   . Other Mother 63    "spot on lung"; secondhand smoke exposure  . Pulmonary fibrosis Father   . Diabetes Brother   .  Thyroid disease Brother   . Leukemia Brother 70    chronic lymphocytic   . Stomach cancer Maternal Grandmother     dx. 61s  . Heart attack Maternal Grandfather   . Diabetes Paternal Grandfather   . Stroke Paternal Grandfather   . Parkinson's disease Maternal Aunt   . Diabetes Paternal Aunt   . Other Paternal Aunt     bowel obstruction  . Heart Problems Paternal Uncle   . Ovarian cancer Maternal Aunt     dx. 2s; paternal half sister of her mother  . Multiple myeloma Maternal Aunt   . Multiple myeloma Maternal Aunt 75  . Kidney failure Paternal Aunt   . Pulmonary fibrosis Paternal Aunt   . Stroke Paternal Aunt   . Heart Problems Paternal Aunt   . Emphysema Paternal Uncle   . Heart Problems Paternal Uncle   . Lung cancer Paternal Uncle     smoker  . Colon cancer Cousin   . Breast cancer Cousin      dx. 61s     GENETIC COUNSELING/TESTING: Yes, performed 01/01/2015: Breast/Ovarian Gene panel ([facility]) reveals no clinically significant variant at ATM, BARD1, BRCA1, BRCA2, BRIP1, CDH1, CHEK2, FANCC, MLH1, MSH2, MSH6, NBN, PALB2, PMS2, PTEN, RAD51C, RAD51D, TP53, and XRCC2   SOCIAL HISTORY:  Elizabeth Ramirez is single and lives with her significant other in [place], [place].  She has no children. 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  She is a former smoker.  She has one glass a wine / night and denies any current or history of illicit drug use.     PHYSICAL EXAMINATION:  Vital Signs: Filed Vitals:   07/04/15 1332  BP: 128/78  Pulse: 85  Temp: 97.9 F (36.6 C)  Resp: 18   ECOG Performance Status: 0  General: Well-nourished, well-appearing female in no acute distress.  She is unaccompanied in clinic today.   HEENT: Head is atraumatic and normocephalic.  Pupils equal and reactive to light and accomodation. Conjunctivae clear without exudate.  Sclerae anicteric. Oral mucosa is pink, moist, and intact without lesions.  Oropharynx is pink without lesions or erythema.  Lymph: No cervical, supraclavicular, infraclavicular, or axillary lymphadenopathy noted on palpation.  Cardiovascular: Regular rate and rhythm without murmurs, rubs, or gallops. Respiratory: Clear to auscultation bilaterally. Chest expansion symmetric without accessory muscle use on inspiration or expiration.  GI: Abdomen soft and round. No tenderness to palpation. Bowel sounds normoactive in 4 quadrants   GU: Deferred.  Musculoskeletal: Healing scar along right knee.  Neuro: No focal deficits. Steady gait.  Psych: Mood and affect normal and appropriate for situation.  Extremities: No edema, cyanosis, or clubbing.  Skin: Warm and dry. No open lesions noted.   LABORATORY DATA:  None for this visit.  DIAGNOSTIC IMAGING:  None for this visit.     ASSESSMENT AND PLAN:   1. 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  Elizabeth Ramirez is doing well without clinical symptoms worrisome for disease recurrence. She will follow-up with her medical oncologist,  Dr. [name], in March 2017 with history and physical examination per surveillance  protocol.  She will resume her anti-estrogen therapy with anastrozole as prescribed by Dr. [name] early next week. She was instructed to make Dr. [name] or myself aware if she begins to experience any new or increased side effects of the medication and I could see her back in clinic to help manage those side effects, as needed. A comprehensive survivorship care plan and treatment summary was reviewed with the patient today detailing her breast cancer diagnosis, treatment course, potential late/long-term effects of treatment, appropriate follow-up care with recommendations for the future, and patient education resources.  A copy of this summary, along with a letter will be sent to the patient's primary care provider via in basket message after today's visit.  Elizabeth Ramirez is welcome to return to the Survivorship Clinic in the future, as needed; no follow-up will be scheduled at this time.    2. Bone health:  Given Elizabeth Ramirez's age/history of breast cancer and her current treatment regimen including endocrine therapy with anastrozole, she is at risk for bone demineralization.  Per our records, her last DEXA scan was performed 03/29/2015 and was normal.  We will continue to monitor this closely while she is on endocrine therapy.   In the meantime, she was encouraged to increase her consumption of foods rich in calcium and vitamin D as well as to increase her weight-bearing activities.  She was given education on specific activities to promote bone health.  3. Cancer screening:  Due to Elizabeth Ramirez's history and her age, she should receive screening for skin cancers, colon cancer, and gynecologic cancers.  The information and recommendations are listed on the patient's comprehensive care plan/treatment summary and were reviewed in detail with the patient.    4. Health maintenance and wellness promotion: Elizabeth Ramirez was encouraged to consume 5-7 servings of fruits and vegetables per day. We reviewed the "Nutrition Rainbow" handout, as well as discussed recommendations to maximize nutrition and minimize recurrence, such as increased intake of fruits, vegetables, lean proteins, and minimizing the intake of red meats and processed foods.  She was also encouraged to engage in moderate to vigorous exercise for 30 minutes per day most days of the week. We discussed the [employer] fitness program, which is designed for cancer survivors to help them become more physically fit after cancer treatments.  She was instructed to limit her alcohol consumption and continue to abstain from tobacco use..  A copy of the "Take Control of Your Health" brochure was given to her reinforcing these recommendations.   5. Support services/counseling: It is not uncommon for this period of the patient's cancer care trajectory to be one of many emotions and stressors; even in patients who have a prior cancer diagnosis like Elizabeth Ramirez.  We discussed an opportunity for her to participate in the next session of [facility] ("Finding Your New Normal") support group series designed for patients after they have completed treatment.  Elizabeth Ramirez was encouraged to take advantage of our many other support services programs, support groups, and/or counseling in coping with  her new life as a cancer survivor after completing anti-cancer treatment.  She was offered support today through active listening and expressive supportive counseling.  She was given information regarding our available services and encouraged to contact me with any questions or for help enrolling in any of our support group/programs.    A total of 50 minutes of face-to-face time was spent with this patient with greater than 50% of that time in counseling and care-coordination.  Sylvan Cheese, NP  Survivorship Program [facility] [phone]   Note: PRIMARY CARE PROVIDER [facility], [place] [phone]

## 2015-09-04 ENCOUNTER — Encounter: Payer: Self-pay | Admitting: Oncology

## 2015-09-11 ENCOUNTER — Ambulatory Visit (HOSPITAL_BASED_OUTPATIENT_CLINIC_OR_DEPARTMENT_OTHER): Payer: BLUE CROSS/BLUE SHIELD | Admitting: Oncology

## 2015-09-11 VITALS — BP 132/51 | HR 82 | Temp 98.4°F | Resp 18 | Ht 64.5 in | Wt 260.2 lb

## 2015-09-11 DIAGNOSIS — C50911 Malignant neoplasm of unspecified site of right female breast: Secondary | ICD-10-CM

## 2015-09-11 DIAGNOSIS — Z853 Personal history of malignant neoplasm of breast: Secondary | ICD-10-CM | POA: Diagnosis not present

## 2015-09-11 DIAGNOSIS — M791 Myalgia: Secondary | ICD-10-CM

## 2015-09-11 DIAGNOSIS — D0511 Intraductal carcinoma in situ of right breast: Secondary | ICD-10-CM

## 2015-09-11 MED ORDER — LETROZOLE 2.5 MG PO TABS: 2.5000 mg | ORAL_TABLET | Freq: Every day | ORAL | Status: DC

## 2015-09-11 NOTE — Progress Notes (Signed)
[facility]  [phone] Fax:([phone]) [phone]     ID: ALAURA SCHIPPERS DOB: 14-Apr-1946  MR#: [id number]  WLN#:[id number]  Patient Care Team: Cari Caraway, MD as PCP - General (Family Medicine) Chauncey Cruel, MD as Consulting Physician (Oncology) Excell Seltzer, MD as Consulting Physician (General Surgery) Thea Silversmith, MD as Consulting Physician (Radiation Oncology) Sylvan Cheese, NP as Nurse Practitioner (Hematology and Oncology) PCP: Cari Caraway, MD GYN:  Bobbye Charleston M.D. OTHER MD: Gaynelle Arabian MD,  Justice Britain M.D.  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 This was estrogen and progesterone receptor positive, HER-2 not amplified. Margins were ample. She participated in the MA-27 study and received anastrozole between June 2005 and June 2010, at which time she was released from follow-up.  More recently, Lezlee had screening bilateral mammography showing a suspicious area in the right breast and on 12/14/2014 underwent right diagnostic mammography at the breast Center. The breast density was category A. There was a group of pleomorphic calcifications in the upper central right breast spanning up to 2.8 cm. Biopsy of this area was obtained 12/21/2014 ([id number]) and showed ductal carcinoma in situ, grade 2 or 3, estrogen receptor 95% positive, and progesterone receptor 60% positive, both with strong staining intensity.  The patient's case was presented at the multidisciplinary breast cancer conference 01/02/2015. At that time a preliminary plan was proposed, namely genetics counseling and if no mutation noted breast MRI to be followed by lumpectomy, radiation, and anti-estrogens.  Her  subsequent history is as detailed below.  INTERVAL HISTORY: Gayla returns today for follow-up of her estrogen receptor positive ductal carcinoma in situ. She started anastrozole January 2017. After tolerating it well initially she has developed significant arthralgias and myalgias, involving that just knees were we know she has arthritis but also shoulders, hips, and to some extent her hands. Vaginal dryness is not an issue. Cost also was fine.  REVIEW OF SYSTEMS: Nevaya had her knee surgery in December 2016 and did fine with that. Rehabilitation was not difficult. She now feels she can walk up to 2 blocks at a time and is planning to get back on her water aerobics more regularly. She does have some night sweats, but otherwise had a detailed review of systems today was noncontributory  PAST MEDICAL HISTORY: Past Medical History  Diagnosis Date  . Thyroid disease   . Allergy     tetracycline, levofloxacin, crabs & bay leaves  . Hypothyroidism   . Arthritis     Rt knee, Rt shoulder  . PONV (postoperative nausea and vomiting)   . Seasonal allergies   . History of kidney stones     x1 '71  . Cancer ([place])   . Breast cancer ([place]) 2016; 2005    Separate primaries; 2016 - DCIS of R breast(surgery and radiation-last 04-04-15); ER/PR+ April 2005    PAST SURGICAL HISTORY: Past Surgical History  Procedure Laterality Date  . Breast lumpectomy Right 09/2003    DCIS  . Appendectomy  08/23/2006  . Colonoscopy with biopsy  12/28/2003    neg  . Tonsillectomy      1954  . Wisdom tooth extraction Bilateral 1965  . Breast lumpectomy with needle localization Right 02/11/2015    Procedure: WIRE BRACKETED  RIGHT BREAST LUMPECTOMY ;  Surgeon: Excell Seltzer, MD;  Location: [facility];  Service: General;  Laterality: Right;  . Total knee arthroplasty Right 05/27/2015    Procedure: RIGHT TOTAL KNEE ARTHROPLASTY;  Surgeon: Gaynelle Arabian, MD;  Location: [facility];  Service: Orthopedics;   Laterality: Right;    FAMILY HISTORY Family History  Problem Relation Age of Onset  . Dementia Mother   . Other Mother 67    "spot on lung"; secondhand smoke exposure  . Pulmonary fibrosis Father   . Diabetes Brother   . Thyroid disease Brother   . Leukemia Brother 70    chronic lymphocytic   . Stomach cancer Maternal Grandmother     dx. 42s  . Heart attack Maternal Grandfather   . Diabetes Paternal Grandfather   . Stroke Paternal Grandfather   . Parkinson's disease Maternal Aunt   . Diabetes Paternal Aunt   . Other Paternal Aunt     bowel obstruction  . Heart Problems Paternal Uncle   . Ovarian cancer Maternal Aunt     dx. 74s; paternal half sister of her mother  . Multiple myeloma Maternal Aunt   . Multiple myeloma Maternal Aunt 75  . Kidney failure Paternal Aunt   . Pulmonary fibrosis Paternal Aunt   . Stroke Paternal Aunt   . Heart Problems Paternal Aunt   . Emphysema Paternal Uncle   . Heart Problems Paternal Uncle   . Lung cancer Paternal Uncle     smoker  . Colon cancer Cousin   . Breast cancer Cousin     dx. 71s   [facility] father died at the age of 65 from pulmonary fibrosis. Her mother died at age 59 with severe dementia. The patient had one brother no sisters. Her maternal grandmother died from stomach cancer the age of 17. The patient's mother had a half sister with ovarian cancer.  GYNECOLOGIC HISTORY:  No LMP recorded. Patient is postmenopausal.  menarche age 62, she isGX P0. Menopause 2003. Did not take hormone replacement  SOCIAL HISTORY:  Works as a Cabin crew, Publishing copy in renovations. At home it's her and Henriette Combs , who also is in real estate, with her own investment business.Katharine Look is a catholic, though not currently practicing    ADVANCED DIRECTIVES:  [name] is Chief Technology Officer of attorney   HEALTH MAINTENANCE: Social History  Substance Use Topics  . Smoking status: Former Smoker -- 1.00 packs/day for 9 years    Types: Cigarettes     Quit date: 06/16/1983  . Smokeless tobacco: Never Used  . Alcohol Use: 4.2 oz/week    7 Glasses of wine per week     Comment: one glass w/ dinner     Colonoscopy:  20/15, [place] she knee  PAP: up-to-date/[facility]  Bone density: October 2016; normal  Lipid panel:  Allergies  Allergen Reactions  . Levofloxacin Other (See Comments)    Affected tendons, severe pain and swelling  . Tetracyclines & Related Swelling    tongue swelling and peeling  . Other Diarrhea and Rash    Crabs - diarrhea. Bay leaves - rash in her mouth  . Flonase [Fluticasone]     Nasal spray cause nose bleeds  . Nitrofurantoin Monohyd Macro Hives  . Scopolamine     "Headache and redness of eyes"  . Tramadol     Current Outpatient Prescriptions  Medication Sig Dispense Refill  . acetaminophen (TYLENOL) 325 MG tablet Take 2 tablets (650 mg total) by mouth every 6 (  six) hours as needed for mild pain (or Fever >/= 101). 40 tablet 0  . anastrozole (ARIMIDEX) 1 MG tablet Take 1 tablet (1 mg total) by mouth daily. (Patient not taking: Reported on 07/04/2015) 90 tablet 4  . docusate sodium (COLACE) 100 MG capsule Take 1 capsule (100 mg total) by mouth 2 (two) times daily. (Patient not taking: Reported on 07/04/2015) 10 capsule 0  . ipratropium (ATROVENT) 0.03 % nasal spray Place 2 sprays into the nose every 12 (twelve) hours. (Patient taking differently: Place 1 spray into the nose daily. ) 30 mL 0  . levothyroxine (SYNTHROID, LEVOTHROID) 137 MCG tablet Take 137 mcg by mouth daily before breakfast.    . oxyCODONE (OXY IR/ROXICODONE) 5 MG immediate release tablet Take 1-2 tablets (5-10 mg total) by mouth every 3 (three) hours as needed for moderate pain or severe pain. (Patient not taking: Reported on 07/04/2015) 80 tablet 0  . traMADol (ULTRAM) 50 MG tablet Take 1-2 tablets (50-100 mg total) by mouth every 6 (six) hours as needed (mild pain). (Patient not taking: Reported on 07/04/2015) 80 tablet 1  . valACYclovir (VALTREX)  1000 MG tablet Take 2 g by mouth See admin instructions. Reported on 07/04/2015  3   No current facility-administered medications for this visit.    OBJECTIVE:  Middle-aged white woman In no acute distress Filed Vitals:   09/11/15 1457  BP: 132/51  Pulse: 82  Temp: 98.4 F (36.9 C)  Resp: 18     Body mass index is 43.99 kg/(m^2).    ECOG FS:1 - Symptomatic but completely ambulatory  Sclerae unicteric, EOMs intact Oropharynx clear, dentition in good repair No cervical or supraclavicular adenopathy Lungs no rales or rhonchi Heart regular rate and rhythm Abd soft, obese, nontender, positive bowel sounds MSK no focal spinal tenderness, no upper extremity lymphedema Neuro: nonfocal, well oriented, appropriate affect Breasts: The right breast is status post lumpectomy and radiation. I do not palpate any suspicious masses. There are no skin or nipple changes of concern. The right axilla is benign. Left breast is unremarkable   LAB RESULTS:  CMP     Component Value Date/Time   NA 137 05/29/2015 0405   K 3.9 05/29/2015 0405   CL 105 05/29/2015 0405   CO2 26 05/29/2015 0405   GLUCOSE 124* 05/29/2015 0405   BUN 11 05/29/2015 0405   CREATININE 0.90 05/29/2015 0405   CALCIUM 8.3* 05/29/2015 0405   PROT 6.8 05/20/2015 1152   ALBUMIN 4.1 05/20/2015 1152   AST 21 05/20/2015 1152   ALT 24 05/20/2015 1152   ALKPHOS 82 05/20/2015 1152   BILITOT 1.6* 05/20/2015 1152   GFRNONAA >60 05/29/2015 0405   GFRAA >60 05/29/2015 0405    INo results found for: SPEP, UPEP  Lab Results  Component Value Date   WBC 8.2 05/29/2015   NEUTROABS 4.2 12/04/2008   HGB 10.8* 05/29/2015   HCT 31.9* 05/29/2015   MCV 89.9 05/29/2015   PLT 137* 05/29/2015      Chemistry      Component Value Date/Time   NA 137 05/29/2015 0405   K 3.9 05/29/2015 0405   CL 105 05/29/2015 0405   CO2 26 05/29/2015 0405   BUN 11 05/29/2015 0405   CREATININE 0.90 05/29/2015 0405      Component Value Date/Time    CALCIUM 8.3* 05/29/2015 0405   ALKPHOS 82 05/20/2015 1152   AST 21 05/20/2015 1152   ALT 24 05/20/2015 1152   BILITOT 1.6* 05/20/2015 1152  Lab Results  Component Value Date   LABCA2 22 12/04/2008    No components found for: [id number]  No results for input(s): INR in the last 168 hours.  Urinalysis    Component Value Date/Time   COLORURINE YELLOW 05/20/2015 1120   APPEARANCEUR CLEAR 05/20/2015 1120   LABSPEC 1.009 05/20/2015 1120   PHURINE 6.5 05/20/2015 1120   GLUCOSEU NEGATIVE 05/20/2015 1120   HGBUR NEGATIVE 05/20/2015 1120   BILIRUBINUR NEGATIVE 05/20/2015 1120   BILIRUBINUR neg 12/19/2014 1427   KETONESUR NEGATIVE 05/20/2015 1120   PROTEINUR NEGATIVE 05/20/2015 1120   PROTEINUR 30 12/19/2014 1427   UROBILINOGEN 0.2 12/19/2014 1427   NITRITE NEGATIVE 05/20/2015 1120   NITRITE neg 12/19/2014 1427   LEUKOCYTESUR NEGATIVE 05/20/2015 1120    STUDIES: No results found.  ASSESSMENT: 70 y.o. [place] woman  (1) status post right lumpectomy April 2005 for a pT1a pN0, stage IA invasive tubular breast cancer, estrogen and progesterone receptor positive, HER-2 negative, in the setting of DCIS;   (a) did not receive radiation  (b) status post anastrozole for 5 years completed June 2010  (2) status post right breast upper outer quadrant biopsy 12/21/2014 for ductal carcinoma in situ, grade 2 or 3, estrogen and progesterone receptor positive.  (3) Status post right lumpectomy 02/11/2015 showing ductal carcinoma in situ, intermediate grade, measuring 0.9 cm, with close but negative margins  (4) adjuvant radiation: 03/07/2015-04/04/2015  Right breast/ 42.5 Gy at 2.5 Gy per fraction x 17 fractions.  Right breast boost/ 7.5 Gy at 2.5 Gy per fraction x 3 fractions  (5) genetic testing 01/14/2015 through the [facility] Panel offered by [facility] [name], MD) showed no deleterious mutations in ATM, BARD1, BRCA1, BRCA2, BRIP1, CDH1,  CHEK2, FANCC, MLH1, MSH2, MSH6, NBN, PALB2, PMS2, PTEN, RAD51C, RAD51D, TP53, and XRCC2. This panel also includes deletion/duplication analysis (without sequencing) for one gene, EPCAM.  (6) resumed anastrozole 07/06/2014, discontinued March 2017 with arthralgias and myalgias developing   (a) to start letrozole 10/14/2015  (b) Bone density 03/29/2015 was normal, with the lowest T score at 0.4   PLAN: Shyla we'll soon be out 2 years from her definitive surgery with no evidence of disease recurrence. This is very favorable.  Now that her knee is better she is noticing arthralgias and myalgias which were perhaps masked before. I think the anastrozole may well be the culprit and I have asked her to stop it. After a month, if she still having the same problems, then we could go back to anastrozole. More likely however we will switch to letrozole.  We discussed the possible toxicities, side effects and complications letrozole which she understands can be but do not have to be similar to those of anastrozole I have had many patients who could not tolerate 1 drug and did well with the other.  She will see me again in July. If she doesn't tolerate the letrozole well we will consider exemestane  Today I give her information on the [facility] program.  She knows to call for any problems that may develop before the next visit here.  Chauncey Cruel, MD   09/11/2015 3:08 PM Medical Oncology and Hematology [facility] [address] [place], [place] [address] Tel. [phone]    Fax. [phone] [address]

## 2015-10-14 ENCOUNTER — Encounter: Payer: Self-pay | Admitting: Oncology

## 2015-11-04 ENCOUNTER — Encounter: Payer: Self-pay | Admitting: Oncology

## 2015-11-12 ENCOUNTER — Other Ambulatory Visit: Payer: Self-pay | Admitting: *Deleted

## 2015-11-12 MED ORDER — ANASTROZOLE 1 MG PO TABS: 1.0000 mg | ORAL_TABLET | Freq: Every day | ORAL | Status: DC

## 2015-12-02 ENCOUNTER — Encounter: Payer: Self-pay | Admitting: Oncology

## 2015-12-04 ENCOUNTER — Encounter: Payer: Self-pay | Admitting: Oncology

## 2015-12-31 ENCOUNTER — Telehealth: Payer: Self-pay | Admitting: Oncology

## 2015-12-31 ENCOUNTER — Ambulatory Visit (HOSPITAL_BASED_OUTPATIENT_CLINIC_OR_DEPARTMENT_OTHER): Payer: BLUE CROSS/BLUE SHIELD | Admitting: Oncology

## 2015-12-31 VITALS — BP 146/81 | HR 82 | Temp 98.1°F | Resp 18 | Ht 64.5 in | Wt 262.6 lb

## 2015-12-31 DIAGNOSIS — C50411 Malignant neoplasm of upper-outer quadrant of right female breast: Secondary | ICD-10-CM

## 2015-12-31 DIAGNOSIS — D0511 Intraductal carcinoma in situ of right breast: Secondary | ICD-10-CM

## 2015-12-31 HISTORY — DX: Morbid (severe) obesity due to excess calories: E66.01

## 2015-12-31 NOTE — Progress Notes (Signed)
[place]  [phone]) [phone] Fax:(336) [phone]     ID: Elizabeth Ramirez DOB: [birth year]  MR#: [id number]  FWY#:[id number]  Patient Care Team: Cari Caraway, MD as PCP - General (Family Medicine) Chauncey Cruel, MD as Consulting Physician (Oncology) Excell Seltzer, MD as Consulting Physician (General Surgery) Thea Silversmith, MD as Consulting Physician (Radiation Oncology) Sylvan Cheese, NP as Nurse Practitioner (Hematology and Oncology) PCP: Cari Caraway, MD GYN:  Bobbye Charleston M.D. OTHER MD: Gaynelle Arabian MD,  Justice Britain M.D.  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 This was estrogen and progesterone receptor positive, HER-2 not amplified. Margins were ample. She participated in the MA-27 study and received anastrozole between June 2005 and June 2010, at which time she was released from follow-up.  More recently, Elizabeth Ramirez had screening bilateral mammography showing a suspicious area in the right breast and on 12/14/2014 underwent right diagnostic mammography at the breast Center. The breast density was category A. There was a group of pleomorphic calcifications in the upper central right breast spanning up to 2.8 cm. Biopsy of this area was obtained 12/21/2014 ([id number]) and showed ductal carcinoma in situ, grade 2 or 3, estrogen receptor 95% positive, and progesterone receptor 60% positive, both with strong staining intensity.  The patient's case was presented at the multidisciplinary breast cancer conference 01/02/2015. At that time a preliminary plan was proposed, namely genetics counseling and if no mutation noted breast MRI to be followed by lumpectomy, radiation, and anti-estrogens.  Her  subsequent history is as detailed below.  INTERVAL HISTORY: Elizabeth Ramirez returns today for follow-up of her right-sided ductal carcinoma in situ. To recap: She started anastrozole but did not tolerate it well. We started her on letrozole early June and that was worse. She tried to go back to the anastrozole but he was just as bad as before. In short she is intolerant of both those medications.  REVIEW OF SYSTEMS: Elizabeth Ramirez is a member of the gym and has a Clinical research associate and has just started going about 5 times a week. She does have pain in her joints spell, but much better than while on the aromatase inhibitors. Central these issues a detailed review of systems today was stable.  PAST MEDICAL HISTORY: Past Medical History  Diagnosis Date  . Thyroid disease   . Allergy     tetracycline, levofloxacin, crabs & bay leaves  . Hypothyroidism   . Arthritis     Rt knee, Rt shoulder  . PONV (postoperative nausea and vomiting)   . Seasonal allergies   . History of kidney stones     x1 '71  . Cancer ([place])   . Breast cancer ([place]) 2016; 2005    Separate primaries; 2016 - DCIS of R breast(surgery and radiation-last 04-04-15); ER/PR+ April 2005    PAST SURGICAL HISTORY: Past Surgical History  Procedure Laterality Date  . Breast lumpectomy Right 09/2003    DCIS  . Appendectomy  08/23/2006  . Colonoscopy with biopsy  12/28/2003    neg  . Tonsillectomy      1954  . Wisdom tooth extraction Bilateral 1965  . Breast lumpectomy with needle localization Right 02/11/2015    Procedure: WIRE BRACKETED RIGHT BREAST LUMPECTOMY ;  Surgeon: Excell Seltzer, MD;  Location: [facility];  Service: General;  Laterality: Right;  . Total knee arthroplasty Right 05/27/2015    Procedure: RIGHT TOTAL KNEE ARTHROPLASTY;  Surgeon: Gaynelle Arabian, MD;  Location: [facility];  Service: Orthopedics;  Laterality: Right;    FAMILY HISTORY Family History  Problem Relation Age of Onset  . Dementia Mother   . Other Mother 28     "spot on lung"; secondhand smoke exposure  . Pulmonary fibrosis Father   . Diabetes Brother   . Thyroid disease Brother   . Leukemia Brother 70    chronic lymphocytic   . Stomach cancer Maternal Grandmother     dx. 32s  . Heart attack Maternal Grandfather   . Diabetes Paternal Grandfather   . Stroke Paternal Grandfather   . Parkinson's disease Maternal Aunt   . Diabetes Paternal Aunt   . Other Paternal Aunt     bowel obstruction  . Heart Problems Paternal Uncle   . Ovarian cancer Maternal Aunt     dx. 62s; paternal half sister of her mother  . Multiple myeloma Maternal Aunt   . Multiple myeloma Maternal Aunt 75  . Kidney failure Paternal Aunt   . Pulmonary fibrosis Paternal Aunt   . Stroke Paternal Aunt   . Heart Problems Paternal Aunt   . Emphysema Paternal Uncle   . Heart Problems Paternal Uncle   . Lung cancer Paternal Uncle     smoker  . Colon cancer Cousin   . Breast cancer Cousin     dx. 36s   Elizabeth Ramirez Cancer father died at the age of 27 from pulmonary fibrosis. Her mother died at age 73 with severe dementia. The patient had one brother no sisters. Her maternal grandmother died from stomach cancer the age of 8. The patient's mother had a half sister with ovarian cancer.  GYNECOLOGIC HISTORY:  No LMP recorded. Patient is postmenopausal.  menarche age 24, she isGX P0. Menopause 2003. Did not take hormone replacement  SOCIAL HISTORY:  Works as a Cabin crew, Publishing copy in renovations. At home it's her and Henriette Combs , who also is in real estate, with her own investment business.Katharine Look is a catholic, though not currently practicing    ADVANCED DIRECTIVES:  [name] is Chief Technology Officer of attorney   HEALTH MAINTENANCE: Social History  Substance Use Topics  . Smoking status: Former Smoker -- 1.00 packs/day for 9 years    Types: Cigarettes    Quit date: 06/16/1983  . Smokeless tobacco: Never Used  . Alcohol Use: 4.2 oz/week    7 Glasses of wine per week     Comment:  one glass w/ dinner     Colonoscopy:  20/15, [place] she knee  PAP: up-to-date/[facility]  Bone density: October 2016; normal  Lipid panel:  Allergies  Allergen Reactions  . Levofloxacin Other (See Comments)    Affected tendons, severe pain and swelling  . Tetracyclines & Related Swelling    tongue swelling and peeling  . Other Diarrhea and Rash    Crabs - diarrhea. Bay leaves - rash in her mouth  . Flonase [Fluticasone]     Nasal spray cause nose bleeds  . Nitrofurantoin Monohyd Macro Hives  . Scopolamine     "Headache and redness of eyes"  . Tramadol     Current Outpatient Prescriptions  Medication Sig Dispense Refill  . anastrozole (ARIMIDEX) 1 MG tablet Take 1 tablet (1 mg total) by mouth daily. 30 tablet 4  . ipratropium (ATROVENT) 0.03 % nasal spray Place 2  sprays into the nose every 12 (twelve) hours. (Patient taking differently: Place 1 spray into the nose daily. ) 30 mL 0  . levothyroxine (SYNTHROID, LEVOTHROID) 137 MCG tablet Take 137 mcg by mouth daily before breakfast.     No current facility-administered medications for this visit.    OBJECTIVE:  Middle-aged white woman in no acute distress Filed Vitals:   12/31/15 1455  BP: 146/81  Pulse: 82  Temp: 98.1 F (36.7 C)  Resp: 18     Body mass index is 44.4 kg/(m^2).    ECOG FS:1 - Symptomatic but completely ambulatory  Sclerae unicteric, pupils round and equal Oropharynx clear and moist-- no thrush or other lesions No cervical or supraclavicular adenopathy Lungs no rales or rhonchi Heart regular rate and rhythm Abd soft, obese, nontender, positive bowel sounds MSK no focal spinal tenderness, no upper extremity lymphedema Neuro: nonfocal, well oriented, appropriate affect Breasts: The right breast is status post lumpectomy followed by radiation. There is an area of induration beneath the incision scar. This is unchanged. There is no unusual tenderness, swelling, or erythema. There is no evidence of local  recurrence. The right axilla is benign. Left breast is unremarkable.  RESULTS:  CMP     Component Value Date/Time   NA 137 05/29/2015 0405   K 3.9 05/29/2015 0405   CL 105 05/29/2015 0405   CO2 26 05/29/2015 0405   GLUCOSE 124* 05/29/2015 0405   BUN 11 05/29/2015 0405   CREATININE 0.90 05/29/2015 0405   CALCIUM 8.3* 05/29/2015 0405   PROT 6.8 05/20/2015 1152   ALBUMIN 4.1 05/20/2015 1152   AST 21 05/20/2015 1152   ALT 24 05/20/2015 1152   ALKPHOS 82 05/20/2015 1152   BILITOT 1.6* 05/20/2015 1152   GFRNONAA >60 05/29/2015 0405   GFRAA >60 05/29/2015 0405    INo results found for: SPEP, UPEP  Lab Results  Component Value Date   WBC 8.2 05/29/2015   NEUTROABS 4.2 12/04/2008   HGB 10.8* 05/29/2015   HCT 31.9* 05/29/2015   MCV 89.9 05/29/2015   PLT 137* 05/29/2015      Chemistry      Component Value Date/Time   NA 137 05/29/2015 0405   K 3.9 05/29/2015 0405   CL 105 05/29/2015 0405   CO2 26 05/29/2015 0405   BUN 11 05/29/2015 0405   CREATININE 0.90 05/29/2015 0405      Component Value Date/Time   CALCIUM 8.3* 05/29/2015 0405   ALKPHOS 82 05/20/2015 1152   AST 21 05/20/2015 1152   ALT 24 05/20/2015 1152   BILITOT 1.6* 05/20/2015 1152       Lab Results  Component Value Date   LABCA2 22 12/04/2008    No components found for: [id number]  No results for input(s): INR in the last 168 hours.  Urinalysis    Component Value Date/Time   COLORURINE YELLOW 05/20/2015 1120   APPEARANCEUR CLEAR 05/20/2015 1120   LABSPEC 1.009 05/20/2015 1120   PHURINE 6.5 05/20/2015 1120   GLUCOSEU NEGATIVE 05/20/2015 1120   HGBUR NEGATIVE 05/20/2015 1120   BILIRUBINUR NEGATIVE 05/20/2015 1120   BILIRUBINUR neg 12/19/2014 1427   KETONESUR NEGATIVE 05/20/2015 1120   PROTEINUR NEGATIVE 05/20/2015 1120   PROTEINUR 30 12/19/2014 1427   UROBILINOGEN 0.2 12/19/2014 1427   NITRITE NEGATIVE 05/20/2015 1120   NITRITE neg 12/19/2014 1427   LEUKOCYTESUR NEGATIVE 05/20/2015 1120     STUDIES: Mammography due this month  ASSESSMENT: 70 y.o. [place] woman  (1) status post right lumpectomy  April 2005 for a pT1a pN0, stage IA invasive breast cancer, estrogen and progesterone receptor positive, HER-2 negative, in the setting of DCIS;   (a) did not receive radiation  (b) status post anastrozole for 5 years completed June 2010  (2) status post right breast upper outer quadrant biopsy 12/21/2014 for ductal carcinoma in situ, grade 2 or 3, estrogen and progesterone receptor positive.  (3) Status post right lumpectomy 02/11/2015 showing ductal carcinoma in situ, intermediate grade, measuring 0.9 cm, with close but negative margins  (4) adjuvant radiation: 03/07/2015-04/04/2015  Right breast/ 42.5 Gy at 2.5 Gy per fraction x 17 fractions.  Right breast boost/ 7.5 Gy at 2.5 Gy per fraction x 3 fractions  (5) genetic testing 01/14/2015 through the [facility] Panel offered by [facility] [name], MD) showed no deleterious mutations in ATM, BARD1, BRCA1, BRCA2, BRIP1, CDH1, CHEK2, FANCC, MLH1, MSH2, MSH6, NBN, PALB2, PMS2, PTEN, RAD51C, RAD51D, TP53, and XRCC2. This panel also includes deletion/duplication analysis (without sequencing) for one gene, EPCAM.  (6) resumed anastrozole 07/06/2014, discontinued March 2017 with arthralgias and myalgias developing   (a) to start letrozole 10/14/2015  (b) Bone density 03/29/2015 was normal, with the lowest T score at 0.4   PLAN: Nilsa is now a year out from definitive surgery for her noninvasive breast cancer, with no evidence of disease recurrence. This is favorable.  She has been unable to tolerate anastrozole and letrozole. I think the chances of her tolerating exemestane are slim area considering that we are taking these medications more as a preventive and as a treatment, I am comfortable giving anti-estrogens a break. Note that we do not want to go to tamoxifen because of concerns  regarding blood clot.  Accordingly we are moving to observation in long-term follow-up. She sees her primary care physician in September and will see Dr. Excell Seltzer either in December or January. She will return to see me late July 2018 after her mammogram next year.  Incidentally she is due for mammography now and I'll schedule that for next week  Floetta has a good understanding of this plan. She agrees with it. She knows to call for any problems or issues that may develop before her next visit here.  Chauncey Cruel, MD   12/31/2015 2:56 PM Medical Oncology and Hematology [facility] [address] [place], [place] [address] Tel. [phone]    Fax. [phone] [address]

## 2015-12-31 NOTE — Telephone Encounter (Signed)
Gave patient avs report and appointments for August 2018  - August vs end of July 2018 per patient. Patient also given mammo for 01/09/16 @ 1 pm @ [facility].

## 2016-01-09 ENCOUNTER — Ambulatory Visit
Admission: RE | Admit: 2016-01-09 | Discharge: 2016-01-09 | Disposition: A | Discharge status: Home / Self Care | Payer: BLUE CROSS/BLUE SHIELD | Source: Ambulatory Visit | Attending: Oncology | Admitting: Oncology

## 2016-01-09 DIAGNOSIS — C50411 Malignant neoplasm of upper-outer quadrant of right female breast: Secondary | ICD-10-CM

## 2016-01-27 ENCOUNTER — Encounter: Payer: Self-pay | Admitting: Adult Health

## 2016-01-27 NOTE — Progress Notes (Signed)
This encounter was created in error - please disregard.

## 2016-01-27 NOTE — Progress Notes (Signed)
DATE:  06/04/15  MRN:  [id number]  BIRTHDAY: Jan 06, 1946  Facility:  [facility] Home Location:  [place] Room Number: 703-P  LEVEL OF CARE:  SNF (31)  Contact Information    Name Relation Home Work [place] Significant other [phone]  [phone]       Code Status History    Date Active Date Inactive Code Status Order ID Comments User Context   05/27/2015  1:45 PM 05/29/2015  6:49 PM Full Code [id number]  Gaynelle Arabian, MD Inpatient       Chief Complaint  Patient presents with  . Discharge Note    HISTORY OF PRESENT ILLNESS:  This is a 70 year old female who is for discharge home with Home health PT and OT. DME:  Standard walker.  She has been admitted to [facility] on 05/29/15 from [facility] with right knee osteoarthritis S/P right total knee arthroplasty.  Patient was admitted to this facility for short-term rehabilitation after the patient's recent hospitalization.  Patient has completed SNF rehabilitation and therapy has cleared the patient for discharge.   PAST MEDICAL HISTORY:  Past Medical History:  Diagnosis Date  . Allergy    tetracycline, levofloxacin, crabs & bay leaves  . Arthritis    Rt knee, Rt shoulder  . Breast cancer ([place]) 2016; 2005   Separate primaries; 2016 - DCIS of R breast(surgery and radiation-last 04-04-15); ER/PR+ April 2005  . Cancer ([place])   . History of kidney stones    x1 '71  . Hypothyroidism   . PONV (postoperative nausea and vomiting)   . Seasonal allergies   . Thyroid disease      CURRENT MEDICATIONS: Reviewed  Patient's Medications     OXYCODONE 5 MG 1-2 TABS  BY MOUTH EVERY 6 HOURS AS NEEDED  Previous Medications   LEVOTHYROXINE (SYNTHROID, LEVOTHROID) 137 MCG TABLET    Take 137 mcg by mouth daily before breakfast.     XARELTO 10 MG 1 TAB BY MOUTH DAILY DISCONTINUE ON 06/16/15 THEN START ASPIRIN EC 81 MG 1 TAB BY MOUTH DAILY     ROBAXIN 500 MG 1 TAB BY  MOUTH EVERY 6 HOURS AS NEEDED    Allergies  Allergen Reactions  . Levofloxacin Other (See Comments)    Affected tendons, severe pain and swelling  . Tetracyclines & Related Swelling    tongue swelling and peeling  . Other Diarrhea and Rash    Crabs - diarrhea. Bay leaves - rash in her mouth  . Flonase [Fluticasone]     Nasal spray cause nose bleeds  . Nitrofurantoin Monohyd Macro Hives  . Scopolamine     "Headache and redness of eyes"  . Tramadol     REVIEW OF SYSTEMS:  GENERAL: no change in appetite, no fatigue, no weight changes, no fever, chills or weakness EYES: Denies change in vision, dry eyes, eye pain, itching or discharge EARS: Denies change in hearing, ringing in ears, or earache NOSE: Denies nasal congestion or epistaxis MOUTH and THROAT: Denies oral discomfort, gingival pain or bleeding, pain from teeth or hoarseness   RESPIRATORY: no cough, SOB, DOE, wheezing, hemoptysis CARDIAC: no chest pain, edema or palpitations GI: no abdominal pain, diarrhea, constipation, heart burn, nausea or vomiting GU: Denies dysuria, frequency, hematuria, incontinence, or discharge PSYCHIATRIC: Denies feeling of depression or anxiety. No report of hallucinations, insomnia, paranoia, or agitation    PHYSICAL EXAMINATION  GENERAL APPEARANCE: Well nourished. In no acute distress. Morbidly obese  SKIN:  Right knee surgical incision is dry, no redness HEAD: Normal in size and contour. No evidence of trauma EYES: Lids open and close normally. No blepharitis, entropion or ectropion. PERRL. Conjunctivae are clear and sclerae are white. Lenses are without opacity EARS: Pinnae are normal. Patient hears normal voice tunes of the examiner MOUTH and THROAT: Lips are without lesions. Oral mucosa is moist and without lesions. Tongue is normal in shape, size, and color and without lesions NECK: supple, trachea midline, no neck masses, no thyroid tenderness, no thyromegaly LYMPHATICS: no LAN in the  neck, no supraclavicular LAN RESPIRATORY: breathing is even & unlabored, BS CTAB CARDIAC: RRR, no murmur,no extra heart sounds, no edema GI: abdomen soft, normal BS, no masses, no tenderness, no hepatomegaly, no splenomegaly EXTREMITIES:  Able to move X 4 extremities PSYCHIATRIC: Alert and oriented X 3. Affect and behavior are appropriate  LABS/RADIOLOGY: Labs reviewed: 05/30/16   WBC 6.7 hemoglobin 10.6 hematocrit 32.3 MCV 90.7 platelet 190 sodium 141 K4.7 glucose 104 BUN 10 creatinine 0.79 GFR [id number] Basic Metabolic Panel:  Recent Labs  05/20/15 1152 05/28/15 0459 05/29/15 0405  NA 139 135 137  K 4.2 3.7 3.9  CL 104 104 105  CO2 28 25 26   GLUCOSE 101* 130* 124*  BUN 18 11 11   CREATININE 1.00 0.76 0.90  CALCIUM 9.2 8.6* 8.3*   Liver Function Tests:  Recent Labs  05/20/15 1152  AST 21  ALT 24  ALKPHOS 82  BILITOT 1.6*  PROT 6.8  ALBUMIN 4.1   CBC:  Recent Labs  05/20/15 1152 05/28/15 0459 05/29/15 0405  WBC 6.2 9.1 8.2  HGB 14.5 11.6* 10.8*  HCT 42.6 34.3* 31.9*  MCV 89.3 88.6 89.9  PLT 179 170 137*      ASSESSMENT/PLAN:  Unsteady gait - for Home health PT and OT; fall precaution  Right knee osteoarthritis S/P right total knee arthroplasty - for home health PT and OT; continue OxyIR 5 mg 1-2 tabs by mouth every 6 hours when necessary and Tylenol 650 mg by mouth every 6 hours when necessary and Tylenol 500 mg ES take 2 tabs = 1000 mg by mouth twice a day for pain; Robaxin 500 mg 1 tab by mouth every 6 hours when necessary for muscle spasm; Xarelto 10 mg 1 tab by mouth daily  With stop date on 06/16/15 then aspirin 81 mg EC 1 tab by mouth daily 3 more weeks; follow-up with orthopedic surgeon  Constipation - continue senna S 2 tabs by mouth daily at bedtime and MiraLAX 17 g by mouth daily  Hypothyroidism - continue Synthroid 137 g 1 tab by mouth daily  Anemia, acute blood loss - re-checked hgb 10.6; stable  History of right breast cancer - patient reported  that Cristopher [facility] is not due to resume until 07/07/15    I have filled out patient's discharge paperwork and written prescriptions.  Patient will receive home health PT and OT.  DME provided: Standard Walker  Total discharge time: Greater than 30 minutes  Discharge time involved coordination of the discharge process with Education officer, museum, nursing staff and therapy department. Medical justification for home health services/DME verified.    Durenda Age, NP [employer] [phone]

## 2016-10-01 DIAGNOSIS — E039 Hypothyroidism, unspecified: Secondary | ICD-10-CM | POA: Diagnosis not present

## 2016-11-09 DIAGNOSIS — R05 Cough: Secondary | ICD-10-CM | POA: Diagnosis not present

## 2016-11-09 DIAGNOSIS — J0100 Acute maxillary sinusitis, unspecified: Secondary | ICD-10-CM | POA: Diagnosis not present

## 2016-11-23 DIAGNOSIS — J329 Chronic sinusitis, unspecified: Secondary | ICD-10-CM | POA: Diagnosis not present

## 2016-11-23 DIAGNOSIS — R05 Cough: Secondary | ICD-10-CM | POA: Diagnosis not present

## 2016-12-04 ENCOUNTER — Other Ambulatory Visit: Payer: Self-pay | Admitting: Oncology

## 2016-12-04 DIAGNOSIS — Z853 Personal history of malignant neoplasm of breast: Secondary | ICD-10-CM

## 2016-12-15 ENCOUNTER — Encounter: Payer: Self-pay | Admitting: Oncology

## 2017-01-07 ENCOUNTER — Ambulatory Visit: Payer: Medicare Other | Admitting: Oncology

## 2017-01-11 ENCOUNTER — Ambulatory Visit
Admission: RE | Admit: 2017-01-11 | Discharge: 2017-01-11 | Disposition: A | Discharge status: Home / Self Care | Payer: Medicare Other | Source: Ambulatory Visit | Attending: Oncology | Admitting: Oncology

## 2017-01-11 DIAGNOSIS — R928 Other abnormal and inconclusive findings on diagnostic imaging of breast: Secondary | ICD-10-CM | POA: Diagnosis not present

## 2017-01-11 DIAGNOSIS — Z853 Personal history of malignant neoplasm of breast: Secondary | ICD-10-CM

## 2017-01-11 HISTORY — DX: Personal history of irradiation: Z92.3

## 2017-01-17 NOTE — Progress Notes (Signed)
[facility]  Telephone:(336) [phone] Fax:(336) [phone]     ID: Elizabeth Ramirez DOB: 1945-07-31  MR#: [id number]  SVX#:[id number]  Patient Care Team: Cari Caraway, MD as PCP - General (Family Medicine) Viann Nielson, Virgie Dad, MD as Consulting Physician (Oncology) Excell Seltzer, MD as Consulting Physician (General Surgery) Thea Silversmith, MD (Inactive) as Consulting Physician (Radiation Oncology) Sylvan Cheese, NP as Nurse Practitioner (Hematology and Oncology) PCP: Cari Caraway, MD GYN:  Bobbye Charleston M.D. OTHER MD: Gaynelle Arabian MD,  Justice Britain M.D.  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 This was estrogen and progesterone receptor positive, HER-2 not amplified. Margins were ample. She participated in the MA-27 study and received anastrozole between June 2005 and June 2010, at which time she was released from follow-up.  More recently, Elizabeth Ramirez had screening bilateral mammography showing a suspicious area in the right breast and on 12/14/2014 underwent right diagnostic mammography at the [facility] Center. The breast density was category A. There was a group of pleomorphic calcifications in the upper central right breast spanning up to 2.8 cm. Biopsy of this area was obtained 12/21/2014 ([id number]) and showed ductal carcinoma in situ, grade 2 or 3, estrogen receptor 95% positive, and progesterone receptor 60% positive, both with strong staining intensity.  The patient's case was presented at the multidisciplinary breast cancer conference 01/02/2015. At that time a preliminary plan was proposed, namely genetics counseling and if no mutation noted breast MRI to be followed by lumpectomy, radiation, and  anti-estrogens.  Her subsequent history is as detailed below.  INTERVAL HISTORY: Ambry returns today for follow-up of her estrogen receptor positive breast cancer. Interval history is generally unremarkable. She continues on observation alone. She has minimal discomfort in the right breast at times, but no findings of concern and she just had mammography within the last 2 weeks which was unremarkable.  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 She does water aerobics 3 times a week, and does yoga at least once a week. She has an excellent diet. Her main problem continues to be insomnia. A detailed review of systems today was otherwise stable  PAST MEDICAL HISTORY: Past Medical History:  Diagnosis Date  . Allergy    tetracycline, levofloxacin, crabs & bay leaves  . Arthritis    Rt knee, Rt shoulder  . Breast cancer ([place]) 2016; 2005   Separate primaries; 2016 - DCIS of R breast(surgery and radiation-last 04-04-15); ER/PR+ April 2005  . Cancer ([place])   . History of kidney stones    x1 '71  . Hypothyroidism   . Personal history of radiation therapy   . PONV (postoperative nausea and vomiting)   . Seasonal allergies   . Thyroid disease     PAST SURGICAL HISTORY: Past Surgical History:  Procedure Laterality Date  . APPENDECTOMY  08/23/2006  . BREAST LUMPECTOMY Right 09/2003   DCIS  . BREAST LUMPECTOMY Right 2016  . BREAST LUMPECTOMY WITH NEEDLE LOCALIZATION Right 02/11/2015   Procedure: WIRE BRACKETED RIGHT BREAST LUMPECTOMY ;  Surgeon: Excell Seltzer, MD;  Location: [facility];  Service: General;  Laterality: Right;  . colonoscopy with biopsy  12/28/2003   neg  . TONSILLECTOMY  [facility] Right 05/27/2015   Procedure: RIGHT TOTAL KNEE ARTHROPLASTY;  Surgeon: Gaynelle Arabian, MD;  Location: [facility];  Service: Orthopedics;  Laterality: Right;  . WISDOM TOOTH EXTRACTION Bilateral 1965    FAMILY  HISTORY Family History  Problem Relation Age of Onset  . Dementia Mother   . Other Mother 43       "spot on lung"; secondhand smoke exposure  . Pulmonary fibrosis Father   . Diabetes Brother   . Thyroid disease Brother   . Leukemia Brother 70       chronic lymphocytic   . Stomach cancer Maternal Grandmother        dx. 64s  . Heart attack Maternal Grandfather   . Diabetes Paternal Grandfather   . Stroke Paternal Grandfather   . Parkinson's disease Maternal Aunt   . Diabetes Paternal Aunt   . Other Paternal Aunt        bowel obstruction  . Heart Problems Paternal Uncle   . Ovarian cancer Maternal Aunt        dx. 54s; paternal half sister of her mother  . Multiple myeloma Maternal Aunt   . Multiple myeloma Maternal Aunt 75  . Kidney failure Paternal Aunt   . Pulmonary fibrosis Paternal Aunt   . Stroke Paternal Aunt   . Heart Problems Paternal Aunt   . Emphysema Paternal Uncle   . Heart Problems Paternal Uncle   . Lung cancer Paternal Uncle        smoker  . Colon cancer Cousin   . Breast cancer Cousin        dx. 28s   [facility] father died at the age of 56 from pulmonary fibrosis. Her mother died at age 33 with severe dementia. The patient had one brother no sisters. Her maternal grandmother died from stomach cancer the age of 67. The patient's mother had a half sister with ovarian cancer.  GYNECOLOGIC HISTORY:  No LMP recorded. Patient is postmenopausal.  menarche age 8, she isGX P0. Menopause 2003. Did not take hormone replacement  SOCIAL HISTORY:  Works as a Cabin crew, Publishing copy in renovations. She retired from [employer] as of 2018 and is now doing some Electronics engineer. 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 Smoking status:  Former Smoker    Packs/day: 1.00    Years: 9.00    Types: Cigarettes    Quit date: 06/16/1983  . Smokeless tobacco: Never Used  . Alcohol use 4.2 oz/week    7 Glasses of wine per week     Comment: one glass w/ dinner     Colonoscopy:  20/15, [place] she knee  PAP: up-to-date/[facility]  Bone density: October 2016; normal  Lipid panel:  Allergies  Allergen Reactions  . Levofloxacin Other (See Comments)    Affected tendons, severe pain and swelling  . Tetracyclines & Related Swelling    tongue swelling and peeling  . Other Diarrhea and Rash    Crabs - diarrhea. Bay leaves - rash in her mouth  . Flonase [Fluticasone]     Nasal spray cause nose bleeds  . Nitrofurantoin Monohyd Macro Hives  . Scopolamine     "Headache and redness of eyes"  . Tramadol  Current Outpatient Prescriptions  Medication Sig Dispense Refill  . levothyroxine (SYNTHROID, LEVOTHROID) 137 MCG tablet Take 137 mcg by mouth daily before breakfast.     No current facility-administered medications for this visit.     OBJECTIVE:  Middle-aged white woman Who appears stated age  Vitals:   01/18/17 1319  BP: 134/80  Pulse: 71  Resp: 18  Temp: 97.6 F (36.4 C)     Body mass index is 44.99 kg/m.    ECOG FS:1 - Symptomatic but completely ambulatory  Sclerae unicteric, EOMs intact Oropharynx clear and moist No cervical or supraclavicular adenopathy Lungs no rales or rhonchi Heart regular rate and rhythm Abd soft, nontender, positive bowel sounds MSK no focal spinal tenderness, no upper extremity lymphedema Neuro: nonfocal, well oriented, appropriate affect Breasts: The right breast is status post lumpectomy and radiation. There is some distortion of the breast contour. There is no evidence of local recurrence. Left breast is benign. Both axillae are benign.   RESULTS:  CMP     Component Value Date/Time   NA 137 05/29/2015 0405   K 3.9 05/29/2015 0405   CL 105 05/29/2015 0405   CO2 26 05/29/2015  0405   GLUCOSE 124 (H) 05/29/2015 0405   BUN 11 05/29/2015 0405   CREATININE 0.90 05/29/2015 0405   CALCIUM 8.3 (L) 05/29/2015 0405   PROT 6.8 05/20/2015 1152   ALBUMIN 4.1 05/20/2015 1152   AST 21 05/20/2015 1152   ALT 24 05/20/2015 1152   ALKPHOS 82 05/20/2015 1152   BILITOT 1.6 (H) 05/20/2015 1152   GFRNONAA >60 05/29/2015 0405   GFRAA >60 05/29/2015 0405    INo results found for: SPEP, UPEP  Lab Results  Component Value Date   WBC 8.2 05/29/2015   NEUTROABS 4.2 12/04/2008   HGB 10.8 (L) 05/29/2015   HCT 31.9 (L) 05/29/2015   MCV 89.9 05/29/2015   PLT 137 (L) 05/29/2015      Chemistry      Component Value Date/Time   NA 137 05/29/2015 0405   K 3.9 05/29/2015 0405   CL 105 05/29/2015 0405   CO2 26 05/29/2015 0405   BUN 11 05/29/2015 0405   CREATININE 0.90 05/29/2015 0405      Component Value Date/Time   CALCIUM 8.3 (L) 05/29/2015 0405   ALKPHOS 82 05/20/2015 1152   AST 21 05/20/2015 1152   ALT 24 05/20/2015 1152   BILITOT 1.6 (H) 05/20/2015 1152       Lab Results  Component Value Date   LABCA2 22 12/04/2008    No components found for: LABCA125  No results for input(s): INR in the last 168 hours.  Urinalysis    Component Value Date/Time   COLORURINE YELLOW 05/20/2015 1120   APPEARANCEUR CLEAR 05/20/2015 1120   LABSPEC 1.009 05/20/2015 1120   PHURINE 6.5 05/20/2015 1120   GLUCOSEU NEGATIVE 05/20/2015 1120   HGBUR NEGATIVE 05/20/2015 1120   BILIRUBINUR NEGATIVE 05/20/2015 1120   BILIRUBINUR neg 12/19/2014 1427   KETONESUR NEGATIVE 05/20/2015 1120   PROTEINUR NEGATIVE 05/20/2015 1120   UROBILINOGEN 0.2 12/19/2014 1427   NITRITE NEGATIVE 05/20/2015 1120   LEUKOCYTESUR NEGATIVE 05/20/2015 1120    STUDIES: Bilateral diagnostic mammography with tomography at [facility] 01/11/2017 showed the breast density to be category B. There was no evidence of malignancy.  ASSESSMENT: 70 y.o. Elizabeth Ramirez woman  (1) status post right lumpectomy April  2005 for a pT1a pN0, stage IA invasive breast cancer, estrogen and progesterone receptor positive, HER-2 negative, in   the setting of DCIS;   (a) did not receive radiation  (b) status post anastrozole for 5 years completed June 2010  (2) status post right breast upper outer quadrant biopsy 12/21/2014 for ductal carcinoma in situ, grade 2 or 3, estrogen and progesterone receptor positive.  (3) Status post right lumpectomy 02/11/2015 showing ductal carcinoma in situ, intermediate grade, measuring 0.9 cm, with close but negative margins  (4) adjuvant radiation: 03/07/2015-04/04/2015  Right breast/ 42.5 Gy at 2.5 Gy per fraction x 17 fractions.  Right breast boost/ 7.5 Gy at 2.5 Gy per fraction x 3 fractions  (5) genetic testing 01/14/2015 through the [facility] Panel offered by [facility] [name], MD) showed no deleterious mutations in ATM, BARD1, BRCA1, BRCA2, BRIP1, CDH1, CHEK2, FANCC, MLH1, MSH2, MSH6, NBN, PALB2, PMS2, PTEN, RAD51C, RAD51D, TP53, and XRCC2. This panel also includes deletion/duplication analysis (without sequencing) for one gene, EPCAM.  (6) resumed anastrozole 07/06/2014, discontinued March 2017 with arthralgias and myalgias developing   (a) started letrozole 10/14/2015--discontinued after a brief course secondary to side effects  (b) Bone density 03/29/2015 was normal, with the lowest T score at 0.4   PLAN: Elizabeth Ramirez is now 2 years out from definitive surgery for her noninvasive breast cancer, with no evidence of disease recurrence. This is very favorable.  I understand her anxiety regarding her friend who had a recurrence. We discussed breast MRI, and she understands she does not qualify for it in terms of insurance, that it is not documented to improve survival in this setting, and that it would put her at risk of false positives.  We discussed anxiety as such and issues relating to posttraumatic stress. We discussed mild  antidepressants which can be helpful in this setting. At this time she prefers to continue what she is doing and indeed she hasn't good diet and a good exercise program, with yoga being particularly helpful  She will see me again in one year. She knows to call for any problems that may develop before that visit.  Chauncey Cruel, MD   01/18/2017 1:36 PM Medical Oncology and Hematology [facility] [address] [place], [place] [address] Tel. [phone]    Fax. [phone] [address]

## 2017-01-18 ENCOUNTER — Other Ambulatory Visit: Payer: Self-pay | Admitting: *Deleted

## 2017-01-18 ENCOUNTER — Ambulatory Visit (HOSPITAL_BASED_OUTPATIENT_CLINIC_OR_DEPARTMENT_OTHER): Payer: Medicare Other | Admitting: Oncology

## 2017-01-18 VITALS — BP 134/80 | HR 71 | Temp 97.6°F | Resp 18 | Ht 64.5 in | Wt 266.2 lb

## 2017-01-18 DIAGNOSIS — Z853 Personal history of malignant neoplasm of breast: Secondary | ICD-10-CM | POA: Diagnosis not present

## 2017-01-18 DIAGNOSIS — D0511 Intraductal carcinoma in situ of right breast: Secondary | ICD-10-CM | POA: Diagnosis not present

## 2017-01-18 DIAGNOSIS — F419 Anxiety disorder, unspecified: Secondary | ICD-10-CM

## 2017-01-18 DIAGNOSIS — C50411 Malignant neoplasm of upper-outer quadrant of right female breast: Secondary | ICD-10-CM

## 2017-01-18 DIAGNOSIS — Z170 Estrogen receptor positive status [ER+]: Secondary | ICD-10-CM | POA: Diagnosis not present

## 2017-01-18 DIAGNOSIS — G4700 Insomnia, unspecified: Secondary | ICD-10-CM

## 2017-02-21 DIAGNOSIS — J069 Acute upper respiratory infection, unspecified: Secondary | ICD-10-CM | POA: Diagnosis not present

## 2017-03-19 DIAGNOSIS — E039 Hypothyroidism, unspecified: Secondary | ICD-10-CM | POA: Diagnosis not present

## 2017-03-19 DIAGNOSIS — E782 Mixed hyperlipidemia: Secondary | ICD-10-CM | POA: Diagnosis not present

## 2017-03-19 DIAGNOSIS — B009 Herpesviral infection, unspecified: Secondary | ICD-10-CM | POA: Diagnosis not present

## 2017-03-19 DIAGNOSIS — Z0001 Encounter for general adult medical examination with abnormal findings: Secondary | ICD-10-CM | POA: Diagnosis not present

## 2017-03-19 DIAGNOSIS — E669 Obesity, unspecified: Secondary | ICD-10-CM | POA: Diagnosis not present

## 2017-03-19 DIAGNOSIS — M25511 Pain in right shoulder: Secondary | ICD-10-CM | POA: Diagnosis not present

## 2017-03-19 DIAGNOSIS — Z23 Encounter for immunization: Secondary | ICD-10-CM | POA: Diagnosis not present

## 2017-03-19 DIAGNOSIS — N183 Chronic kidney disease, stage 3 (moderate): Secondary | ICD-10-CM | POA: Diagnosis not present

## 2017-03-19 DIAGNOSIS — E559 Vitamin D deficiency, unspecified: Secondary | ICD-10-CM | POA: Diagnosis not present

## 2017-03-19 DIAGNOSIS — M25512 Pain in left shoulder: Secondary | ICD-10-CM | POA: Diagnosis not present

## 2017-03-19 DIAGNOSIS — G8929 Other chronic pain: Secondary | ICD-10-CM | POA: Diagnosis not present

## 2017-03-19 DIAGNOSIS — Z1159 Encounter for screening for other viral diseases: Secondary | ICD-10-CM | POA: Diagnosis not present

## 2017-03-19 DIAGNOSIS — M7551 Bursitis of right shoulder: Secondary | ICD-10-CM | POA: Diagnosis not present

## 2017-03-19 DIAGNOSIS — Z131 Encounter for screening for diabetes mellitus: Secondary | ICD-10-CM | POA: Diagnosis not present

## 2017-03-24 DIAGNOSIS — M25511 Pain in right shoulder: Secondary | ICD-10-CM | POA: Diagnosis not present

## 2017-03-24 DIAGNOSIS — Z0000 Encounter for general adult medical examination without abnormal findings: Secondary | ICD-10-CM | POA: Diagnosis not present

## 2017-03-24 DIAGNOSIS — Z131 Encounter for screening for diabetes mellitus: Secondary | ICD-10-CM | POA: Diagnosis not present

## 2017-03-24 DIAGNOSIS — J329 Chronic sinusitis, unspecified: Secondary | ICD-10-CM | POA: Diagnosis not present

## 2017-03-24 DIAGNOSIS — N183 Chronic kidney disease, stage 3 (moderate): Secondary | ICD-10-CM | POA: Diagnosis not present

## 2017-03-24 DIAGNOSIS — E039 Hypothyroidism, unspecified: Secondary | ICD-10-CM | POA: Diagnosis not present

## 2017-03-24 DIAGNOSIS — E669 Obesity, unspecified: Secondary | ICD-10-CM | POA: Diagnosis not present

## 2017-03-24 DIAGNOSIS — E559 Vitamin D deficiency, unspecified: Secondary | ICD-10-CM | POA: Diagnosis not present

## 2017-03-24 DIAGNOSIS — R011 Cardiac murmur, unspecified: Secondary | ICD-10-CM | POA: Diagnosis not present

## 2017-03-24 DIAGNOSIS — Z23 Encounter for immunization: Secondary | ICD-10-CM | POA: Diagnosis not present

## 2017-03-24 DIAGNOSIS — J309 Allergic rhinitis, unspecified: Secondary | ICD-10-CM | POA: Diagnosis not present

## 2017-03-24 DIAGNOSIS — B009 Herpesviral infection, unspecified: Secondary | ICD-10-CM | POA: Diagnosis not present

## 2017-03-29 DIAGNOSIS — M799 Soft tissue disorder, unspecified: Secondary | ICD-10-CM | POA: Diagnosis not present

## 2017-03-29 DIAGNOSIS — M6250 Muscle wasting and atrophy, not elsewhere classified, unspecified site: Secondary | ICD-10-CM | POA: Diagnosis not present

## 2017-03-29 DIAGNOSIS — M25512 Pain in left shoulder: Secondary | ICD-10-CM | POA: Diagnosis not present

## 2017-03-29 DIAGNOSIS — M25511 Pain in right shoulder: Secondary | ICD-10-CM | POA: Diagnosis not present

## 2017-04-02 DIAGNOSIS — M799 Soft tissue disorder, unspecified: Secondary | ICD-10-CM | POA: Diagnosis not present

## 2017-04-02 DIAGNOSIS — M25511 Pain in right shoulder: Secondary | ICD-10-CM | POA: Diagnosis not present

## 2017-04-02 DIAGNOSIS — M25512 Pain in left shoulder: Secondary | ICD-10-CM | POA: Diagnosis not present

## 2017-04-02 DIAGNOSIS — M6250 Muscle wasting and atrophy, not elsewhere classified, unspecified site: Secondary | ICD-10-CM | POA: Diagnosis not present

## 2017-04-05 DIAGNOSIS — M799 Soft tissue disorder, unspecified: Secondary | ICD-10-CM | POA: Diagnosis not present

## 2017-04-05 DIAGNOSIS — M25511 Pain in right shoulder: Secondary | ICD-10-CM | POA: Diagnosis not present

## 2017-04-05 DIAGNOSIS — M6250 Muscle wasting and atrophy, not elsewhere classified, unspecified site: Secondary | ICD-10-CM | POA: Diagnosis not present

## 2017-04-05 DIAGNOSIS — M25512 Pain in left shoulder: Secondary | ICD-10-CM | POA: Diagnosis not present

## 2017-04-08 DIAGNOSIS — M25512 Pain in left shoulder: Secondary | ICD-10-CM | POA: Diagnosis not present

## 2017-04-08 DIAGNOSIS — M799 Soft tissue disorder, unspecified: Secondary | ICD-10-CM | POA: Diagnosis not present

## 2017-04-08 DIAGNOSIS — M25511 Pain in right shoulder: Secondary | ICD-10-CM | POA: Diagnosis not present

## 2017-04-08 DIAGNOSIS — M6250 Muscle wasting and atrophy, not elsewhere classified, unspecified site: Secondary | ICD-10-CM | POA: Diagnosis not present

## 2017-04-09 DIAGNOSIS — M25511 Pain in right shoulder: Secondary | ICD-10-CM | POA: Diagnosis not present

## 2017-04-09 DIAGNOSIS — M6250 Muscle wasting and atrophy, not elsewhere classified, unspecified site: Secondary | ICD-10-CM | POA: Diagnosis not present

## 2017-04-09 DIAGNOSIS — M25512 Pain in left shoulder: Secondary | ICD-10-CM | POA: Diagnosis not present

## 2017-04-09 DIAGNOSIS — M799 Soft tissue disorder, unspecified: Secondary | ICD-10-CM | POA: Diagnosis not present

## 2017-04-12 DIAGNOSIS — M25512 Pain in left shoulder: Secondary | ICD-10-CM | POA: Diagnosis not present

## 2017-04-12 DIAGNOSIS — M799 Soft tissue disorder, unspecified: Secondary | ICD-10-CM | POA: Diagnosis not present

## 2017-04-12 DIAGNOSIS — M25511 Pain in right shoulder: Secondary | ICD-10-CM | POA: Diagnosis not present

## 2017-04-12 DIAGNOSIS — M6250 Muscle wasting and atrophy, not elsewhere classified, unspecified site: Secondary | ICD-10-CM | POA: Diagnosis not present

## 2017-04-15 DIAGNOSIS — M25511 Pain in right shoulder: Secondary | ICD-10-CM | POA: Diagnosis not present

## 2017-04-15 DIAGNOSIS — M25512 Pain in left shoulder: Secondary | ICD-10-CM | POA: Diagnosis not present

## 2017-04-15 DIAGNOSIS — M6250 Muscle wasting and atrophy, not elsewhere classified, unspecified site: Secondary | ICD-10-CM | POA: Diagnosis not present

## 2017-04-15 DIAGNOSIS — M799 Soft tissue disorder, unspecified: Secondary | ICD-10-CM | POA: Diagnosis not present

## 2017-04-21 DIAGNOSIS — M6250 Muscle wasting and atrophy, not elsewhere classified, unspecified site: Secondary | ICD-10-CM | POA: Diagnosis not present

## 2017-04-21 DIAGNOSIS — M25511 Pain in right shoulder: Secondary | ICD-10-CM | POA: Diagnosis not present

## 2017-04-21 DIAGNOSIS — M799 Soft tissue disorder, unspecified: Secondary | ICD-10-CM | POA: Diagnosis not present

## 2017-04-21 DIAGNOSIS — M25512 Pain in left shoulder: Secondary | ICD-10-CM | POA: Diagnosis not present

## 2017-04-23 DIAGNOSIS — M25512 Pain in left shoulder: Secondary | ICD-10-CM | POA: Diagnosis not present

## 2017-04-23 DIAGNOSIS — M25511 Pain in right shoulder: Secondary | ICD-10-CM | POA: Diagnosis not present

## 2017-04-23 DIAGNOSIS — M6250 Muscle wasting and atrophy, not elsewhere classified, unspecified site: Secondary | ICD-10-CM | POA: Diagnosis not present

## 2017-04-23 DIAGNOSIS — M799 Soft tissue disorder, unspecified: Secondary | ICD-10-CM | POA: Diagnosis not present

## 2017-04-26 ENCOUNTER — Encounter: Payer: Self-pay | Admitting: Cardiovascular Disease

## 2017-04-26 ENCOUNTER — Ambulatory Visit (INDEPENDENT_AMBULATORY_CARE_PROVIDER_SITE_OTHER): Payer: Medicare Other | Admitting: Cardiovascular Disease

## 2017-04-26 VITALS — BP 124/60 | HR 80 | Ht 64.5 in | Wt 262.8 lb

## 2017-04-26 DIAGNOSIS — R011 Cardiac murmur, unspecified: Secondary | ICD-10-CM

## 2017-04-26 DIAGNOSIS — E039 Hypothyroidism, unspecified: Secondary | ICD-10-CM

## 2017-04-26 NOTE — Patient Instructions (Signed)
Medication Instructions:  Your physician recommends that you continue on your current medications as directed. Please refer to the Current Medication list given to you today.   Labwork: None Ordered   Testing/Procedures: Your physician has requested that you have an echocardiogram. Echocardiography is a painless test that uses sound waves to create images of your heart. It provides your doctor with information about the size and shape of your heart and how well your heart's chambers and valves are working. This procedure takes approximately one hour. There are no restrictions for this procedure.    Follow-Up: Your physician recommends that you schedule a follow-up appointment in: as needed with Dr. Nahser.    If you need a refill on your cardiac medications before your next appointment, please call your pharmacy.   Thank you for choosing [facility]! Michelle Swinyer, RN [phone]    

## 2017-04-26 NOTE — Progress Notes (Signed)
Cardiology Office Note:    Date:  04/26/2017   ID:  Elizabeth Ramirez, DOB 28-Jul-1945, MRN [id number]  PCP:  Cari Caraway, MD  Cardiologist:  Mertie Moores, MD    Referring MD: Cari Caraway, MD   Chief Complaint  Patient presents with  . Heart Murmur    History of Present Illness:    Elizabeth Ramirez is a 71 y.o. female who is being seen today for the evaluation of heart murmur  at the request of Cari Caraway, MD.   Elizabeth Ramirez is a 71 y.o. female with a hx of hypothyroidism, breast cancer, obesity, right total knee replacement who we are asked to see today for evaluation of heart murmur.  Heart murmur was incidentally found on physical exam.  She denies having any episodes of chest pain or shortness of breath. She goes to water aerobics on a regular basis.  No cough or cold symptoms.   No seasonal allergies   Has had breast cancer twice ( right breast)    Past Medical History:  Diagnosis Date  . Allergy    tetracycline, levofloxacin, crabs & bay leaves  . Arthritis    Rt knee, Rt shoulder  . Breast cancer ([place]) 2016; 2005   Separate primaries; 2016 - DCIS of R breast(surgery and radiation-last 04-04-15); ER/PR+ April 2005  . Cancer ([place])   . Ear infection 10/19/2013  . Genetic testing 01/18/2015   Negative - no mutations found in any of 20 genes on the Breast/Ovarian Cancer Panel.  Additionally, no variants of uncertain significance (VUSs) were found.  The Breast/Ovarian gene panel offered by [facility] [name], MD) includes sequencing and deletion/duplication analysis for the following 19 genes:  ATM, BARD1, BRCA1, BRCA2, BRIP1, CDH1, CHEK2, FANCC, MLH1, MSH2, MSH6, NBN, PALB2, PMS2, PTEN, RAD51C, RAD51D, TP53, and XRCC2.  This panel also includes deletion/duplication analysis (without sequencing) for one gene, EPCAM.  Date of report is January 14, 2015.    Marland Kitchen Heart murmur   . History of kidney stones    x1 '71  . Hypothyroid 07/29/2011  .  Hypothyroidism   . Malignant neoplasm of upper-outer quadrant of right breast in female, estrogen receptor positive ([place]) 01/14/2015  . OA (osteoarthritis) of knee 05/27/2015  . Obesity, morbid, BMI 40.0-49.9 ([place]) 12/31/2015  . Other chronic pain   . Personal history of radiation therapy   . PONV (postoperative nausea and vomiting)   . 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 Seasonal allergies   . Thyroid disease   . Vitamin D deficiency     Past Surgical History:  Procedure Laterality Date  . APPENDECTOMY  08/23/2006  . BREAST LUMPECTOMY Right 09/2003   DCIS  . BREAST LUMPECTOMY Right 2016  . colonoscopy with biopsy  12/28/2003   neg  . TONSILLECTOMY     1954  . WISDOM TOOTH EXTRACTION Bilateral 1965    Current Medications: Current Meds  Medication Sig  . calcium carbonate (CALTRATE 600) 1500 (600 Ca) MG TABS tablet Take two tablets by mouth once daily  . ipratropium (ATROVENT) 0.03 % nasal spray ipratropium bromide 0.03 % nasal spray  SPRAY 2 APPLICATIONS IN EACH NOSTRIL AS NEEDED TWICE A DAY NASALLY 30 DAYS  . levothyroxine (SYNTHROID, LEVOTHROID) 137 MCG tablet Take 137 mcg by mouth daily before breakfast.     Allergies:   Levofloxacin; Tetracyclines & related; Other; Flonase [fluticasone]; Nitrofurantoin monohyd macro; Scopolamine; and Tramadol   Social History   Socioeconomic History  . Marital status: Single  Spouse name: None  . Number of children: 0  . Years of education: None  . Highest education level: None  Social Needs  . Financial resource strain: None  . Food insecurity - worry: None  . Food insecurity - inability: None  . Transportation needs - medical: None  . Transportation needs - non-medical: None  Occupational History  . None  Tobacco Use  . Smoking status: Former Smoker    Packs/day: 1.00    Years: 9.00    Pack years: 9.00    Types: Cigarettes    Last attempt to quit: 06/16/1983    Years since quitting: 33.8  . Smokeless tobacco: Never  Used  Substance and Sexual Activity  . Alcohol use: Yes    Alcohol/week: 4.2 oz    Types: 7 Glasses of wine per week    Comment: one glass w/ dinner  . Drug use: No  . Sexual activity: None  Other Topics Concern  . None  Social History Narrative  . None     Family History: The patient's family history includes Breast cancer in her cousin; Colitis in her paternal grandmother; Colon cancer in her cousin; Dementia in her mother; Diabetes in her brother, paternal aunt, and paternal grandfather; Emphysema in her paternal uncle; Heart Problems in her paternal aunt, paternal uncle, and paternal uncle; Heart attack in her maternal grandfather; Kidney failure in her paternal aunt; Leukemia (age of onset: 61) in her brother; Lung cancer in her paternal uncle; Multiple myeloma in her maternal aunt; Multiple myeloma (age of onset: 48) in her maternal aunt; Other in her paternal aunt; Other (age of onset: 70) in her mother; Ovarian cancer in her maternal aunt; Parkinson's disease in her maternal aunt; Pulmonary fibrosis in her father and paternal aunt; Stomach cancer in her maternal grandmother; Stroke in her paternal aunt and paternal grandfather; Thyroid disease in her brother. ROS:   Please see the history of present illness.     All other systems reviewed and are negative.  EKGs/Labs/Other Studies Reviewed:    The following studies were reviewed today:   EKG: 03/24/17   NSR at 68.  Normal ecg  Recent Labs: No results found for requested labs within last 8760 hours.  Recent Lipid Panel No results found for: CHOL, TRIG, HDL, CHOLHDL, VLDL, LDLCALC, LDLDIRECT  Physical Exam:    VS:  BP 124/60 (BP Location: Left Arm)   Pulse 80   Ht 5' 4.5" (1.638 m)   Wt 262 lb 12.8 oz (119.2 kg)   SpO2 93%   BMI 44.41 kg/m     Wt Readings from Last 3 Encounters:  04/26/17 262 lb 12.8 oz (119.2 kg)  01/18/17 266 lb 3.2 oz (120.7 kg)  12/31/15 262 lb 9.6 oz (119.1 kg)     GEN: Obese middle-aged  female, no acute distress HEENT: Normal NECK: No JVD; No carotid bruits LYMPHATICS: No lymphadenopathy CARDIAC: RR, she has a very soft systolic murmur.   RESPIRATORY:  Clear to auscultation without rales, wheezing or rhonchi  ABDOMEN: Soft, non-tender, non-distended MUSCULOSKELETAL:  No edema; No deformity  SKIN: Warm and dry NEUROLOGIC:  Alert and oriented x 3 PSYCHIATRIC:  Normal affect   ASSESSMENT:    No diagnosis found. PLAN:    In order of problems listed above:  1. Soft systolic murmur.  Her murmur sounds fairly benign.  She does not have any symptoms.  We will get an echocardiogram for further evaluation of her heart murmur.  I will see her  back on an as-needed basis.  I will see her back  if the echo shows some abnormalities   Medication Adjustments/Labs and Tests Ordered: Current medicines are reviewed at length with the patient today.  Concerns regarding medicines are outlined above.  No orders of the defined types were placed in this encounter.  No orders of the defined types were placed in this encounter.   Signed, Mertie Moores, MD  04/26/2017 9:08 AM    [place]

## 2017-04-30 DIAGNOSIS — M25511 Pain in right shoulder: Secondary | ICD-10-CM | POA: Diagnosis not present

## 2017-04-30 DIAGNOSIS — M25512 Pain in left shoulder: Secondary | ICD-10-CM | POA: Diagnosis not present

## 2017-04-30 DIAGNOSIS — M799 Soft tissue disorder, unspecified: Secondary | ICD-10-CM | POA: Diagnosis not present

## 2017-04-30 DIAGNOSIS — M6250 Muscle wasting and atrophy, not elsewhere classified, unspecified site: Secondary | ICD-10-CM | POA: Diagnosis not present

## 2017-05-03 DIAGNOSIS — M6250 Muscle wasting and atrophy, not elsewhere classified, unspecified site: Secondary | ICD-10-CM | POA: Diagnosis not present

## 2017-05-03 DIAGNOSIS — M25511 Pain in right shoulder: Secondary | ICD-10-CM | POA: Diagnosis not present

## 2017-05-03 DIAGNOSIS — M799 Soft tissue disorder, unspecified: Secondary | ICD-10-CM | POA: Diagnosis not present

## 2017-05-03 DIAGNOSIS — M25512 Pain in left shoulder: Secondary | ICD-10-CM | POA: Diagnosis not present

## 2017-05-04 ENCOUNTER — Ambulatory Visit (HOSPITAL_COMMUNITY): Payer: Medicare Other | Attending: Cardiology

## 2017-05-04 ENCOUNTER — Encounter: Payer: Self-pay | Admitting: Cardiovascular Disease

## 2017-05-04 ENCOUNTER — Other Ambulatory Visit: Payer: Self-pay

## 2017-05-04 DIAGNOSIS — I081 Rheumatic disorders of both mitral and tricuspid valves: Secondary | ICD-10-CM | POA: Insufficient documentation

## 2017-05-04 DIAGNOSIS — Z87891 Personal history of nicotine dependence: Secondary | ICD-10-CM | POA: Diagnosis not present

## 2017-05-04 DIAGNOSIS — Z6841 Body Mass Index (BMI) 40.0 and over, adult: Secondary | ICD-10-CM | POA: Diagnosis not present

## 2017-05-04 DIAGNOSIS — E669 Obesity, unspecified: Secondary | ICD-10-CM | POA: Insufficient documentation

## 2017-05-04 DIAGNOSIS — R011 Cardiac murmur, unspecified: Secondary | ICD-10-CM | POA: Diagnosis not present

## 2017-05-10 DIAGNOSIS — M25511 Pain in right shoulder: Secondary | ICD-10-CM | POA: Diagnosis not present

## 2017-05-10 DIAGNOSIS — M25512 Pain in left shoulder: Secondary | ICD-10-CM | POA: Diagnosis not present

## 2017-05-10 DIAGNOSIS — M799 Soft tissue disorder, unspecified: Secondary | ICD-10-CM | POA: Diagnosis not present

## 2017-05-10 DIAGNOSIS — M6250 Muscle wasting and atrophy, not elsewhere classified, unspecified site: Secondary | ICD-10-CM | POA: Diagnosis not present

## 2017-05-14 DIAGNOSIS — M25511 Pain in right shoulder: Secondary | ICD-10-CM | POA: Diagnosis not present

## 2017-05-14 DIAGNOSIS — M25512 Pain in left shoulder: Secondary | ICD-10-CM | POA: Diagnosis not present

## 2017-05-14 DIAGNOSIS — M6250 Muscle wasting and atrophy, not elsewhere classified, unspecified site: Secondary | ICD-10-CM | POA: Diagnosis not present

## 2017-05-14 DIAGNOSIS — M799 Soft tissue disorder, unspecified: Secondary | ICD-10-CM | POA: Diagnosis not present

## 2017-05-17 DIAGNOSIS — M6250 Muscle wasting and atrophy, not elsewhere classified, unspecified site: Secondary | ICD-10-CM | POA: Diagnosis not present

## 2017-05-17 DIAGNOSIS — M25512 Pain in left shoulder: Secondary | ICD-10-CM | POA: Diagnosis not present

## 2017-05-17 DIAGNOSIS — M799 Soft tissue disorder, unspecified: Secondary | ICD-10-CM | POA: Diagnosis not present

## 2017-05-17 DIAGNOSIS — M25511 Pain in right shoulder: Secondary | ICD-10-CM | POA: Diagnosis not present

## 2017-05-20 DIAGNOSIS — M799 Soft tissue disorder, unspecified: Secondary | ICD-10-CM | POA: Diagnosis not present

## 2017-05-20 DIAGNOSIS — M25512 Pain in left shoulder: Secondary | ICD-10-CM | POA: Diagnosis not present

## 2017-05-20 DIAGNOSIS — M25511 Pain in right shoulder: Secondary | ICD-10-CM | POA: Diagnosis not present

## 2017-05-20 DIAGNOSIS — M6250 Muscle wasting and atrophy, not elsewhere classified, unspecified site: Secondary | ICD-10-CM | POA: Diagnosis not present

## 2017-05-21 DIAGNOSIS — M1712 Unilateral primary osteoarthritis, left knee: Secondary | ICD-10-CM | POA: Diagnosis not present

## 2017-05-21 DIAGNOSIS — Z96651 Presence of right artificial knee joint: Secondary | ICD-10-CM | POA: Diagnosis not present

## 2017-05-21 DIAGNOSIS — Z471 Aftercare following joint replacement surgery: Secondary | ICD-10-CM | POA: Diagnosis not present

## 2017-05-21 DIAGNOSIS — M1711 Unilateral primary osteoarthritis, right knee: Secondary | ICD-10-CM | POA: Diagnosis not present

## 2017-05-28 DIAGNOSIS — M799 Soft tissue disorder, unspecified: Secondary | ICD-10-CM | POA: Diagnosis not present

## 2017-05-28 DIAGNOSIS — M6250 Muscle wasting and atrophy, not elsewhere classified, unspecified site: Secondary | ICD-10-CM | POA: Diagnosis not present

## 2017-05-28 DIAGNOSIS — M25511 Pain in right shoulder: Secondary | ICD-10-CM | POA: Diagnosis not present

## 2017-05-28 DIAGNOSIS — M25512 Pain in left shoulder: Secondary | ICD-10-CM | POA: Diagnosis not present

## 2017-05-31 DIAGNOSIS — M6250 Muscle wasting and atrophy, not elsewhere classified, unspecified site: Secondary | ICD-10-CM | POA: Diagnosis not present

## 2017-05-31 DIAGNOSIS — M25512 Pain in left shoulder: Secondary | ICD-10-CM | POA: Diagnosis not present

## 2017-05-31 DIAGNOSIS — M25511 Pain in right shoulder: Secondary | ICD-10-CM | POA: Diagnosis not present

## 2017-05-31 DIAGNOSIS — M799 Soft tissue disorder, unspecified: Secondary | ICD-10-CM | POA: Diagnosis not present

## 2017-06-03 DIAGNOSIS — M25511 Pain in right shoulder: Secondary | ICD-10-CM | POA: Diagnosis not present

## 2017-06-03 DIAGNOSIS — M6250 Muscle wasting and atrophy, not elsewhere classified, unspecified site: Secondary | ICD-10-CM | POA: Diagnosis not present

## 2017-06-03 DIAGNOSIS — M799 Soft tissue disorder, unspecified: Secondary | ICD-10-CM | POA: Diagnosis not present

## 2017-06-03 DIAGNOSIS — M25512 Pain in left shoulder: Secondary | ICD-10-CM | POA: Diagnosis not present

## 2017-06-14 DIAGNOSIS — M6250 Muscle wasting and atrophy, not elsewhere classified, unspecified site: Secondary | ICD-10-CM | POA: Diagnosis not present

## 2017-06-14 DIAGNOSIS — M25511 Pain in right shoulder: Secondary | ICD-10-CM | POA: Diagnosis not present

## 2017-06-14 DIAGNOSIS — M25512 Pain in left shoulder: Secondary | ICD-10-CM | POA: Diagnosis not present

## 2017-06-14 DIAGNOSIS — M799 Soft tissue disorder, unspecified: Secondary | ICD-10-CM | POA: Diagnosis not present

## 2017-08-03 DIAGNOSIS — M1712 Unilateral primary osteoarthritis, left knee: Secondary | ICD-10-CM | POA: Diagnosis not present

## 2017-08-03 DIAGNOSIS — Z6841 Body Mass Index (BMI) 40.0 and over, adult: Secondary | ICD-10-CM | POA: Diagnosis not present

## 2017-08-03 DIAGNOSIS — M25512 Pain in left shoulder: Secondary | ICD-10-CM | POA: Diagnosis not present

## 2017-08-03 DIAGNOSIS — G8929 Other chronic pain: Secondary | ICD-10-CM | POA: Diagnosis not present

## 2017-08-03 DIAGNOSIS — M25511 Pain in right shoulder: Secondary | ICD-10-CM | POA: Diagnosis not present

## 2017-08-18 DIAGNOSIS — M25511 Pain in right shoulder: Secondary | ICD-10-CM | POA: Diagnosis not present

## 2017-08-18 DIAGNOSIS — M25512 Pain in left shoulder: Secondary | ICD-10-CM | POA: Diagnosis not present

## 2017-08-18 DIAGNOSIS — M19012 Primary osteoarthritis, left shoulder: Secondary | ICD-10-CM | POA: Diagnosis not present

## 2017-08-18 DIAGNOSIS — M19011 Primary osteoarthritis, right shoulder: Secondary | ICD-10-CM | POA: Diagnosis not present

## 2017-09-13 DIAGNOSIS — H35363 Drusen (degenerative) of macula, bilateral: Secondary | ICD-10-CM | POA: Diagnosis not present

## 2017-09-13 DIAGNOSIS — H25013 Cortical age-related cataract, bilateral: Secondary | ICD-10-CM | POA: Diagnosis not present

## 2017-09-13 DIAGNOSIS — H2513 Age-related nuclear cataract, bilateral: Secondary | ICD-10-CM | POA: Diagnosis not present

## 2017-09-13 DIAGNOSIS — H2512 Age-related nuclear cataract, left eye: Secondary | ICD-10-CM | POA: Diagnosis not present

## 2017-09-13 DIAGNOSIS — H35033 Hypertensive retinopathy, bilateral: Secondary | ICD-10-CM | POA: Diagnosis not present

## 2017-10-05 DIAGNOSIS — H2512 Age-related nuclear cataract, left eye: Secondary | ICD-10-CM | POA: Diagnosis not present

## 2017-10-05 DIAGNOSIS — H25812 Combined forms of age-related cataract, left eye: Secondary | ICD-10-CM | POA: Diagnosis not present

## 2017-10-20 DIAGNOSIS — M19012 Primary osteoarthritis, left shoulder: Secondary | ICD-10-CM | POA: Diagnosis not present

## 2017-10-20 DIAGNOSIS — R52 Pain, unspecified: Secondary | ICD-10-CM | POA: Diagnosis not present

## 2017-10-20 DIAGNOSIS — M19011 Primary osteoarthritis, right shoulder: Secondary | ICD-10-CM | POA: Diagnosis not present

## 2017-10-20 DIAGNOSIS — M25711 Osteophyte, right shoulder: Secondary | ICD-10-CM | POA: Diagnosis not present

## 2017-10-25 ENCOUNTER — Encounter: Payer: Self-pay | Admitting: Oncology

## 2017-10-26 ENCOUNTER — Other Ambulatory Visit: Payer: Self-pay | Admitting: Oncology

## 2017-10-26 NOTE — Progress Notes (Unsigned)
Elizabeth Ramirez  [phone] [phone]) [phone]     ID: Elizabeth Ramirez DOB: 10-31-1945  MR#: [id number]  TIR#:[id number]  Patient Care Team: Elizabeth Caraway, MD as PCP - General (Family Medicine) Magrinat, Virgie Dad, MD as Consulting Physician (Oncology) Elizabeth Seltzer, MD as Consulting Physician (General Surgery) Elizabeth Silversmith, MD (Inactive) as Consulting Physician (Radiation Oncology) Elizabeth Cheese, NP as Nurse Practitioner (Hematology and Oncology) PCP: Elizabeth Caraway, MD GYN:  Elizabeth Ramirez M.D. OTHER MD: Elizabeth Arabian MD,  Elizabeth Ramirez M.D.  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 This was estrogen and progesterone receptor positive, HER-2 not amplified. Margins were ample. She participated in the MA-27 study and received anastrozole between June 2005 and June 2010, at which time she was released from follow-up.  More recently, Elizabeth Ramirez had screening bilateral mammography showing a suspicious area in the right breast and on 12/14/2014 underwent right diagnostic mammography at the breast Center. The breast density was category A. There was a group of pleomorphic calcifications in the upper central right breast spanning up to 2.8 cm. Biopsy of this area was obtained 12/21/2014 ([id number]) and showed ductal carcinoma in situ, grade 2 or 3, estrogen receptor 95% positive, and progesterone receptor 60% positive, both with strong staining intensity.  The patient's case was presented at the multidisciplinary breast cancer conference 01/02/2015. At that time a preliminary plan was proposed, namely genetics counseling and if no mutation noted breast MRI to be followed by lumpectomy, radiation, and  anti-estrogens.  Her subsequent history is as detailed below.  INTERVAL HISTORY: Elizabeth Ramirez returns today for follow-up of her estrogen receptor positive breast cancer. Interval history is generally unremarkable. She continues on observation alone. She has minimal discomfort in the right breast at times, but no findings of concern and she just had mammography within the last 2 weeks which was unremarkable.  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 She does water aerobics 3 times a week, and does yoga at least once a week. She has an excellent diet. Her main problem continues to be insomnia. A detailed review of systems today was otherwise stable  PAST MEDICAL HISTORY: Past Medical History:  Diagnosis Date  . Allergy    tetracycline, levofloxacin, crabs & bay leaves  . Arthritis    Rt knee, Rt shoulder  . Breast cancer ([place]) 2016; 2005   Separate primaries; 2016 - DCIS of R breast(surgery and radiation-last 04-04-15); ER/PR+ April 2005  . Cancer ([place])   . Ear infection 10/19/2013  . Genetic testing 01/18/2015   Negative - no mutations found in any of 20 genes on the Breast/Ovarian Cancer Panel.  Additionally, no variants of uncertain significance (VUSs) were found.  The Breast/Ovarian gene panel offered by [facility] Elizabeth Pigeon, MD) includes sequencing and deletion/duplication analysis for the following 19 genes:  ATM, BARD1, BRCA1, BRCA2, BRIP1, CDH1, CHEK2, FANCC, MLH1, MSH2, MSH6, NBN, PALB2, PMS2, PTEN, RAD51C, RAD51D, TP53, and XRCC2.  This panel also includes deletion/duplication analysis (without sequencing) for one gene, EPCAM.  Date of report is January 14, 2015.    Marland Kitchen Heart murmur   . History of kidney stones    x1 '71  . Hypothyroid 07/29/2011  . Hypothyroidism   .  Malignant neoplasm of upper-outer quadrant of right breast in female, estrogen receptor positive ([place]) 01/14/2015  . OA (osteoarthritis) of knee 05/27/2015  .  Obesity, morbid, BMI 40.0-49.9 ([place]) 12/31/2015  . Other chronic pain   . Personal history of radiation therapy   . PONV (postoperative nausea and vomiting)   . 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 Seasonal allergies   . Thyroid disease   . Vitamin D deficiency     PAST SURGICAL HISTORY: Past Surgical History:  Procedure Laterality Date  . APPENDECTOMY  08/23/2006  . BREAST LUMPECTOMY Right 09/2003   DCIS  . BREAST LUMPECTOMY Right 2016  . BREAST LUMPECTOMY WITH NEEDLE LOCALIZATION Right 02/11/2015   Procedure: WIRE BRACKETED RIGHT BREAST LUMPECTOMY ;  Surgeon: Elizabeth Seltzer, MD;  Location: [facility];  Service: General;  Laterality: Right;  . colonoscopy with biopsy  12/28/2003   neg  . TONSILLECTOMY     1954  . TOTAL KNEE ARTHROPLASTY Right 05/27/2015   Procedure: RIGHT TOTAL KNEE ARTHROPLASTY;  Surgeon: Elizabeth Arabian, MD;  Location: [facility];  Service: Orthopedics;  Laterality: Right;  . WISDOM TOOTH EXTRACTION Bilateral 1965    FAMILY HISTORY Family History  Problem Relation Age of Onset  . Dementia Mother   . Other Mother 60       "spot on lung"; secondhand smoke exposure  . Pulmonary fibrosis Father   . Diabetes Brother   . Thyroid disease Brother   . Leukemia Brother 70       chronic lymphocytic   . Stomach cancer Maternal Grandmother        dx. 106s  . Heart attack Maternal Grandfather   . Diabetes Paternal Grandfather   . Stroke Paternal Grandfather   . Colitis Paternal Grandmother   . Parkinson's disease Maternal Aunt   . Diabetes Paternal Aunt   . Other Paternal Aunt        bowel obstruction  . Heart Problems Paternal Uncle   . Ovarian cancer Maternal Aunt        dx. 46s; paternal half sister of her mother  . Multiple myeloma Maternal Aunt   . Multiple myeloma Maternal Aunt 75  . Kidney failure Paternal Aunt   . Pulmonary fibrosis Paternal Aunt   . Stroke Paternal Aunt   . Heart Problems Paternal Aunt   . Emphysema Paternal Uncle     . Heart Problems Paternal Uncle   . Lung cancer Paternal Uncle        smoker  . Colon cancer Cousin   . Breast cancer Cousin        dx. 61s   [facility] father died at the age of 60 from pulmonary fibrosis. Her mother died at age 39 with severe dementia. The patient had one brother no sisters. Her maternal grandmother died from stomach cancer the age of 4. The patient's mother had a half sister with ovarian cancer.  GYNECOLOGIC HISTORY:  No LMP recorded. Patient is postmenopausal.  menarche age 88, she isGX P0. Menopause 2003. Did not take hormone replacement  SOCIAL HISTORY:  Works as a Cabin crew, Publishing copy in renovations. She retired from [employer] as of 2018 and is now doing some Electronics engineer. At home it's her and Henriette Combs , who also is in real estate, with her own investment business.Katharine Look is a catholic, though not currently practicing    ADVANCED DIRECTIVES:  [name] is Chief Technology Officer of attorney   HEALTH MAINTENANCE: Social History   Tobacco Use  .  Smoking status: Former Smoker    Packs/day: 1.00    Years: 9.00    Pack years: 9.00    Types: Cigarettes    Last attempt to quit: 06/16/1983    Years since quitting: 34.3  . Smokeless tobacco: Never Used  Substance Use Topics  . Alcohol use: Yes    Alcohol/week: 4.2 oz    Types: 7 Glasses of wine per week    Comment: one glass w/ dinner  . Drug use: No     Colonoscopy:  20/15, [place] she knee  PAP: up-to-date/[facility]  Bone density: October 2016; normal  Lipid panel:  Allergies  Allergen Reactions  . Levofloxacin Other (See Comments)    Affected tendons, severe pain and swelling  . Tetracyclines & Related Swelling    tongue swelling and peeling  . Other Diarrhea and Rash    Crabs - diarrhea. Bay leaves - rash in her mouth  . Flonase [Fluticasone]     Nasal spray cause nose bleeds  . Nitrofurantoin Monohyd Macro Hives  . Scopolamine     "Headache and redness of eyes"  . Tramadol     Current  Outpatient Medications  Medication Sig Dispense Refill  . calcium carbonate (CALTRATE 600) 1500 (600 Ca) MG TABS tablet Take two tablets by mouth once daily    . ipratropium (ATROVENT) 0.03 % nasal spray ipratropium bromide 0.03 % nasal spray  SPRAY 2 APPLICATIONS IN EACH NOSTRIL AS NEEDED TWICE A DAY NASALLY 30 DAYS    . levothyroxine (SYNTHROID, LEVOTHROID) 137 MCG tablet Take 137 mcg by mouth daily before breakfast.     No current facility-administered medications for this visit.     OBJECTIVE:  Middle-aged white woman Who appears stated age  There were no vitals filed for this visit.   There is no height or weight on file to calculate BMI.    ECOG FS:1 - Symptomatic but completely ambulatory  Sclerae unicteric, EOMs intact Oropharynx clear and moist No cervical or supraclavicular adenopathy Lungs no rales or rhonchi Heart regular rate and rhythm Abd soft, nontender, positive bowel sounds MSK no focal spinal tenderness, no upper extremity lymphedema Neuro: nonfocal, well oriented, appropriate affect Breasts: The right breast is status post lumpectomy and radiation. There is some distortion of the breast contour. There is no evidence of local recurrence. Left breast is benign. Both axillae are benign.   RESULTS:  CMP     Component Value Date/Time   NA 137 05/29/2015 0405   K 3.9 05/29/2015 0405   CL 105 05/29/2015 0405   CO2 26 05/29/2015 0405   GLUCOSE 124 (H) 05/29/2015 0405   BUN 11 05/29/2015 0405   CREATININE 0.90 05/29/2015 0405   CALCIUM 8.3 (L) 05/29/2015 0405   PROT 6.8 05/20/2015 1152   ALBUMIN 4.1 05/20/2015 1152   AST 21 05/20/2015 1152   ALT 24 05/20/2015 1152   ALKPHOS 82 05/20/2015 1152   BILITOT 1.6 (H) 05/20/2015 1152   GFRNONAA >60 05/29/2015 0405   GFRAA >60 05/29/2015 0405    INo results found for: SPEP, UPEP  Lab Results  Component Value Date   WBC 8.2 05/29/2015   NEUTROABS 4.2 12/04/2008   HGB 10.8 (L) 05/29/2015   HCT 31.9 (L)  05/29/2015   MCV 89.9 05/29/2015   PLT 137 (L) 05/29/2015      Chemistry      Component Value Date/Time   NA 137 05/29/2015 0405   K 3.9 05/29/2015 0405   CL 105 05/29/2015 0405  CO2 26 05/29/2015 0405   BUN 11 05/29/2015 0405   CREATININE 0.90 05/29/2015 0405      Component Value Date/Time   CALCIUM 8.3 (L) 05/29/2015 0405   ALKPHOS 82 05/20/2015 1152   AST 21 05/20/2015 1152   ALT 24 05/20/2015 1152   BILITOT 1.6 (H) 05/20/2015 1152       Lab Results  Component Value Date   LABCA2 22 12/04/2008    No components found for: [id number]  No results for input(s): INR in the last 168 hours.  Urinalysis    Component Value Date/Time   COLORURINE YELLOW 05/20/2015 1120   APPEARANCEUR CLEAR 05/20/2015 1120   LABSPEC 1.009 05/20/2015 1120   PHURINE 6.5 05/20/2015 1120   GLUCOSEU NEGATIVE 05/20/2015 1120   HGBUR NEGATIVE 05/20/2015 1120   BILIRUBINUR NEGATIVE 05/20/2015 1120   BILIRUBINUR neg 12/19/2014 1427   KETONESUR NEGATIVE 05/20/2015 1120   PROTEINUR NEGATIVE 05/20/2015 1120   UROBILINOGEN 0.2 12/19/2014 1427   NITRITE NEGATIVE 05/20/2015 1120   LEUKOCYTESUR NEGATIVE 05/20/2015 1120    STUDIES: Bilateral diagnostic mammography with tomography at [facility] 01/11/2017 showed the breast density to be category B. There was no evidence of malignancy.  ASSESSMENT: 72 y.o. [employer] woman  (1) status post right lumpectomy April 2005 for a pT1a pN0, stage IA invasive breast cancer, estrogen and progesterone receptor positive, HER-2 negative, in the setting of DCIS;   (a) did not receive radiation  (b) status post anastrozole for 5 years completed June 2010  (2) status post right breast upper outer quadrant biopsy 12/21/2014 for ductal carcinoma in situ, grade 2 or 3, estrogen and progesterone receptor positive.  (3) Status post right lumpectomy 02/11/2015 showing ductal carcinoma in situ, intermediate grade, measuring 0.9 cm, with close but negative  margins  (4) adjuvant radiation: 03/07/2015-04/04/2015  Right breast/ 42.5 Gy at 2.5 Gy per fraction x 17 fractions.  Right breast boost/ 7.5 Gy at 2.5 Gy per fraction x 3 fractions  (5) genetic testing 01/14/2015 through the [facility] Panel offered by [facility] Elizabeth Pigeon, MD) showed no deleterious mutations in ATM, BARD1, BRCA1, BRCA2, BRIP1, CDH1, CHEK2, FANCC, MLH1, MSH2, MSH6, NBN, PALB2, PMS2, PTEN, RAD51C, RAD51D, TP53, and XRCC2. This panel also includes deletion/duplication analysis (without sequencing) for one gene, EPCAM.  (6) resumed anastrozole 07/06/2014, discontinued March 2017 with arthralgias and myalgias developing   (a) started letrozole 10/14/2015--discontinued after a brief course secondary to side effects  (b) Bone density 03/29/2015 was normal, with the lowest T score at 0.4   PLAN: Myracle is now 2 years out from definitive surgery for her noninvasive breast cancer, with no evidence of disease recurrence. This is very favorable.  I understand her anxiety regarding her friend who had a recurrence. We discussed breast MRI, and she understands she does not qualify for it in terms of insurance, that it is not documented to improve survival in this setting, and that it would put her at risk of false positives.  We discussed anxiety as such and issues relating to posttraumatic stress. We discussed mild antidepressants which can be helpful in this setting. At this time she prefers to continue what she is doing and indeed she hasn't good diet and a good exercise program, with yoga being particularly helpful  She will see me again in one year. She knows to call for any problems that may develop before that visit.  Chauncey Cruel, MD   10/26/2017 7:55 AM Medical Oncology and Hematology [address]  [place], [place] [address] Tel. [phone]    Fax. [phone] [address]

## 2017-11-01 DIAGNOSIS — H2511 Age-related nuclear cataract, right eye: Secondary | ICD-10-CM | POA: Diagnosis not present

## 2017-11-01 DIAGNOSIS — H25011 Cortical age-related cataract, right eye: Secondary | ICD-10-CM | POA: Diagnosis not present

## 2017-11-05 DIAGNOSIS — M19011 Primary osteoarthritis, right shoulder: Secondary | ICD-10-CM | POA: Diagnosis not present

## 2017-11-05 DIAGNOSIS — M19012 Primary osteoarthritis, left shoulder: Secondary | ICD-10-CM | POA: Diagnosis not present

## 2017-11-09 DIAGNOSIS — H2511 Age-related nuclear cataract, right eye: Secondary | ICD-10-CM | POA: Diagnosis not present

## 2017-11-09 DIAGNOSIS — H25811 Combined forms of age-related cataract, right eye: Secondary | ICD-10-CM | POA: Diagnosis not present

## 2017-11-19 DIAGNOSIS — D239 Other benign neoplasm of skin, unspecified: Secondary | ICD-10-CM | POA: Diagnosis not present

## 2017-11-19 DIAGNOSIS — D229 Melanocytic nevi, unspecified: Secondary | ICD-10-CM | POA: Diagnosis not present

## 2017-11-19 DIAGNOSIS — L570 Actinic keratosis: Secondary | ICD-10-CM | POA: Diagnosis not present

## 2017-11-25 ENCOUNTER — Other Ambulatory Visit: Payer: Self-pay | Admitting: Oncology

## 2017-11-25 DIAGNOSIS — Z853 Personal history of malignant neoplasm of breast: Secondary | ICD-10-CM

## 2017-12-15 DIAGNOSIS — Z6841 Body Mass Index (BMI) 40.0 and over, adult: Secondary | ICD-10-CM | POA: Diagnosis not present

## 2017-12-15 DIAGNOSIS — Z01419 Encounter for gynecological examination (general) (routine) without abnormal findings: Secondary | ICD-10-CM | POA: Diagnosis not present

## 2018-01-06 DIAGNOSIS — L659 Nonscarring hair loss, unspecified: Secondary | ICD-10-CM | POA: Diagnosis not present

## 2018-01-12 ENCOUNTER — Ambulatory Visit
Admission: RE | Admit: 2018-01-12 | Discharge: 2018-01-12 | Disposition: A | Discharge status: Home / Self Care | Payer: Medicare Other | Source: Ambulatory Visit | Attending: Oncology | Admitting: Oncology

## 2018-01-12 DIAGNOSIS — Z853 Personal history of malignant neoplasm of breast: Secondary | ICD-10-CM | POA: Diagnosis not present

## 2018-01-12 DIAGNOSIS — R928 Other abnormal and inconclusive findings on diagnostic imaging of breast: Secondary | ICD-10-CM | POA: Diagnosis not present

## 2018-01-13 ENCOUNTER — Encounter: Payer: Self-pay | Admitting: Oncology

## 2018-01-16 NOTE — Progress Notes (Signed)
[place]  [phone] [phone]     ID: Elizabeth Ramirez DOB: 10-Jan-1946  MR#: [id number]  XNA#:[id number]  Patient Care Team: Cari Caraway, MD as PCP - General (Family Medicine) Leanette Eutsler, Virgie Dad, MD as Consulting Physician (Oncology) Excell Seltzer, MD as Consulting Physician (General Surgery) Thea Silversmith, MD as Consulting Physician (Radiation Oncology) Sylvan Cheese, NP as Nurse Practitioner (Hematology and Oncology) PCP: Cari Caraway, MD GYN:  Bobbye Charleston M.D. OTHER MD: Gaynelle Arabian MD,  Justice Britain M.D.  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 This was estrogen and progesterone receptor positive, HER-2 not amplified. Margins were ample. She participated in the MA-27 study and received anastrozole between June 2005 and June 2010, at which time she was released from follow-up.  More recently, Elizabeth Ramirez had screening bilateral mammography showing a suspicious area in the right breast and on 12/14/2014 underwent right diagnostic mammography at the breast Center. The breast density was category A. There was a group of pleomorphic calcifications in the upper central right breast spanning up to 2.8 cm. Biopsy of this area was obtained 12/21/2014 ([id number]) and showed ductal carcinoma in situ, grade 2 or 3, estrogen receptor 95% positive, and progesterone receptor 60% positive, both with strong staining intensity.  The patient's case was presented at the multidisciplinary breast cancer conference 01/02/2015. At that time a preliminary plan was proposed, namely genetics counseling and if no mutation noted breast MRI to be followed by lumpectomy, radiation, and anti-estrogens.  Her  subsequent history is as detailed below.  INTERVAL HISTORY: Elizabeth Ramirez returns today for follow-up of her estrogen receptor positive breast cancer. She has been doing well from a breast cancer prospective.   Since her last visit to the office, she underwent bilateral diagnostic mammography with tomography at the [facility] 01/12/2018 showed the breast density to be category B. There was no mammographic evidence of malignancy in either breast. Expected post lumpectomy changes involving the right breast.   REVIEW OF SYSTEMS: Elizabeth Ramirez reports that for exercise, she has tried yoga and water aerobics, however, she hasn't been able to continue due to pain. She has had issues with her shoulders and she has alpha 2 gamma globulin treatment completed in July by a provider in [place]. She has been advised that she would need to have a bilateral shoulder replacement, however, she is hesitating to complete that. She has spoken with Dr. Onnie Graham regarding this. She has been taking Rx Tylenol with no relief of her shoulder pain. She denies unusual headaches, visual changes, nausea, vomiting, or dizziness. There has been no unusual cough, phlegm production, or pleurisy. This been no change in bowel or bladder habits. She denies unexplained fatigue or unexplained weight loss, bleeding, rash, or fever. A detailed review of systems was otherwise stable.     PAST MEDICAL HISTORY: Past Medical History:  Diagnosis Date  . Allergy    tetracycline, levofloxacin, crabs & bay leaves  . Arthritis    Rt knee, Rt shoulder  . Breast cancer ([place]) 2016; 2005   Separate primaries; 2016 - DCIS of R breast(surgery and radiation-last 04-04-15); ER/PR+ April 2005  . Cancer ([place])   . Ear infection 10/19/2013  . Genetic testing 01/18/2015   Negative - no mutations found  in any of 20 genes on the Breast/Ovarian Cancer Panel.  Additionally, no variants of uncertain significance (VUSs) were found.  The Breast/Ovarian gene panel offered by [facility] [name], MD) includes sequencing and deletion/duplication analysis for the following 19 genes:  ATM, BARD1, BRCA1, BRCA2, BRIP1, CDH1, CHEK2, FANCC, MLH1, MSH2, MSH6, NBN, PALB2, PMS2, PTEN, RAD51C, RAD51D, TP53, and XRCC2.  This panel also includes deletion/duplication analysis (without sequencing) for one gene, EPCAM.  Date of report is January 14, 2015.    Marland Kitchen Heart murmur   . History of kidney stones    x1 '71  . Hypothyroid 07/29/2011  . Hypothyroidism   . Malignant neoplasm of upper-outer quadrant of right breast in female, estrogen receptor positive ([place]) 01/14/2015  . OA (osteoarthritis) of knee 05/27/2015  . Obesity, morbid, BMI 40.0-49.9 ([place]) 12/31/2015  . Other chronic pain   . Personal history of radiation therapy   . PONV (postoperative nausea and vomiting)   . 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 Seasonal allergies   . Thyroid disease   . Vitamin D deficiency     PAST SURGICAL HISTORY: Past Surgical History:  Procedure Laterality Date  . APPENDECTOMY  08/23/2006  . BREAST LUMPECTOMY Right 09/2003   DCIS  . BREAST LUMPECTOMY Right 2016  . BREAST LUMPECTOMY WITH NEEDLE LOCALIZATION Right 02/11/2015   Procedure: WIRE BRACKETED RIGHT BREAST LUMPECTOMY ;  Surgeon: Excell Seltzer, MD;  Location: [place];  Service: General;  Laterality: Right;  . colonoscopy with biopsy  12/28/2003   neg  . TONSILLECTOMY     1954  . TOTAL KNEE ARTHROPLASTY Right 05/27/2015   Procedure: RIGHT TOTAL KNEE ARTHROPLASTY;  Surgeon: Gaynelle Arabian, MD;  Location: [facility];  Service: Orthopedics;  Laterality: Right;  . WISDOM TOOTH EXTRACTION Bilateral 1965    FAMILY HISTORY Family History  Problem Relation Age of Onset  . Dementia Mother   . Other Mother 68       "spot on lung"; secondhand smoke exposure  . Pulmonary fibrosis Father   . Diabetes Brother   . Thyroid disease Brother   . Leukemia Brother 70       chronic lymphocytic   . Stomach cancer  Maternal Grandmother        dx. 43s  . Heart attack Maternal Grandfather   . Diabetes Paternal Grandfather   . Stroke Paternal Grandfather   . Colitis Paternal Grandmother   . Parkinson's disease Maternal Aunt   . Diabetes Paternal Aunt   . Other Paternal Aunt        bowel obstruction  . Heart Problems Paternal Uncle   . Ovarian cancer Maternal Aunt        dx. 29s; paternal half sister of her mother  . Multiple myeloma Maternal Aunt   . Multiple myeloma Maternal Aunt 75  . Kidney failure Paternal Aunt   . Pulmonary fibrosis Paternal Aunt   . Stroke Paternal Aunt   . Heart Problems Paternal Aunt   . Emphysema Paternal Uncle   . Heart Problems Paternal Uncle   . Lung cancer Paternal Uncle        smoker  . Colon cancer Cousin   . Breast cancer Cousin        dx. 69s   [place] [facility] father died at the age of 60 from pulmonary fibrosis. Her mother died at age 76 with severe dementia. The patient had one brother no sisters. Her maternal grandmother died from stomach cancer the age of 27. The  patient's mother had a half sister with ovarian cancer.  GYNECOLOGIC HISTORY:  No LMP recorded. Patient is postmenopausal.  menarche age 65, she isGX P0. Menopause 2003. Did not take hormone replacement  SOCIAL HISTORY:  Works as a Cabin crew, Publishing copy in renovations. She retired from [employer] as of 2018 and is now doing some Electronics engineer. At home it's her and Henriette Combs , who also is in real estate, with her own investment business.Elizabeth Ramirez is a catholic, though not currently practicing    ADVANCED DIRECTIVES:  [name] is Chief Technology Officer of attorney   HEALTH MAINTENANCE: Social History   Tobacco Use  . Smoking status: Former Smoker    Packs/day: 1.00    Years: 9.00    Pack years: 9.00    Types: Cigarettes    Last attempt to quit: 06/16/1983    Years since quitting: 34.6  . Smokeless tobacco: Never Used  Substance Use Topics  . Alcohol use: Yes    Alcohol/week: 4.2 oz    Types:  7 Glasses of wine per week    Comment: one glass w/ dinner  . Drug use: No     Colonoscopy:  20/15, [place] she knee  PAP: up-to-date/[facility]  Bone density: October 2016; normal  Lipid panel:  Allergies  Allergen Reactions  . Levofloxacin Other (See Comments)    Affected tendons, severe pain and swelling  . Tetracyclines & Related Swelling    tongue swelling and peeling  . Other Diarrhea and Rash    Crabs - diarrhea. Bay leaves - rash in her mouth  . Flonase [Fluticasone]     Nasal spray cause nose bleeds  . Nitrofurantoin Monohyd Macro Hives  . Scopolamine     "Headache and redness of eyes"  . Tramadol     Current Outpatient Medications  Medication Sig Dispense Refill  . calcium carbonate (CALTRATE 600) 1500 (600 Ca) MG TABS tablet Take two tablets by mouth once daily    . ipratropium (ATROVENT) 0.03 % nasal spray ipratropium bromide 0.03 % nasal spray  SPRAY 2 APPLICATIONS IN EACH NOSTRIL AS NEEDED TWICE A DAY NASALLY 30 DAYS    . levothyroxine (SYNTHROID, LEVOTHROID) 137 MCG tablet Take 137 mcg by mouth daily before breakfast.     No current facility-administered medications for this visit.     OBJECTIVE: Elizabeth Ramirez in no acute distress  Vitals:   01/17/18 1129  BP: (!) 149/77  Pulse: 76  Resp: 18  Temp: 98.2 F (36.8 C)  SpO2: 97%     Body mass index is 44.63 kg/m.    ECOG FS:1 - Symptomatic but completely ambulatory  Sclerae unicteric, pupils round and equal No cervical or supraclavicular adenopathy Lungs no rales or rhonchi Heart regular rate and rhythm Abd soft, nontender, positive bowel sounds MSK no focal spinal tenderness, no upper extremity lymphedema Neuro: nonfocal, well oriented, appropriate affect Breasts: The right breast has undergone lumpectomy followed by radiation, with no evidence of local recurrence.  The left breast is benign.  Both axillae are benign.  RESULTS:  CMP     Component Value Date/Time   NA 137 05/29/2015  0405   K 3.9 05/29/2015 0405   CL 105 05/29/2015 0405   CO2 26 05/29/2015 0405   GLUCOSE 124 (H) 05/29/2015 0405   BUN 11 05/29/2015 0405   CREATININE 0.90 05/29/2015 0405   CALCIUM 8.3 (L) 05/29/2015 0405   PROT 6.8 05/20/2015 1152   ALBUMIN 4.1 05/20/2015 1152   AST 21  05/20/2015 1152   ALT 24 05/20/2015 1152   ALKPHOS 82 05/20/2015 1152   BILITOT 1.6 (H) 05/20/2015 1152   GFRNONAA >60 05/29/2015 0405   GFRAA >60 05/29/2015 0405    INo results found for: SPEP, UPEP  Lab Results  Component Value Date   WBC 8.2 05/29/2015   NEUTROABS 4.2 12/04/2008   HGB 10.8 (L) 05/29/2015   HCT 31.9 (L) 05/29/2015   MCV 89.9 05/29/2015   PLT 137 (L) 05/29/2015      Chemistry      Component Value Date/Time   NA 137 05/29/2015 0405   K 3.9 05/29/2015 0405   CL 105 05/29/2015 0405   CO2 26 05/29/2015 0405   BUN 11 05/29/2015 0405   CREATININE 0.90 05/29/2015 0405      Component Value Date/Time   CALCIUM 8.3 (L) 05/29/2015 0405   ALKPHOS 82 05/20/2015 1152   AST 21 05/20/2015 1152   ALT 24 05/20/2015 1152   BILITOT 1.6 (H) 05/20/2015 1152       Lab Results  Component Value Date   LABCA2 22 12/04/2008    No components found for: [id number]  No results for input(s): INR in the last 168 hours.  Urinalysis    Component Value Date/Time   COLORURINE YELLOW 05/20/2015 1120   APPEARANCEUR CLEAR 05/20/2015 1120   LABSPEC 1.009 05/20/2015 1120   PHURINE 6.5 05/20/2015 1120   GLUCOSEU NEGATIVE 05/20/2015 1120   HGBUR NEGATIVE 05/20/2015 1120   BILIRUBINUR NEGATIVE 05/20/2015 1120   BILIRUBINUR neg 12/19/2014 1427   KETONESUR NEGATIVE 05/20/2015 1120   PROTEINUR NEGATIVE 05/20/2015 1120   UROBILINOGEN 0.2 12/19/2014 1427   NITRITE NEGATIVE 05/20/2015 1120   LEUKOCYTESUR NEGATIVE 05/20/2015 1120    STUDIES: Bilateral diagnostic mammography with tomography at [facility] 01/12/2018 showed the breast density to be category B. There was no mammographic evidence of  malignancy in either breast. Expected post lumpectomy changes involving the right breast.   ASSESSMENT: 72 y.o. Elizabeth Ramirez  (1) status post right lumpectomy April 2005 for a pT1a pN0, stage IA invasive breast cancer, estrogen and progesterone receptor positive, HER-2 negative, in the setting of DCIS;   (a) did not receive radiation  (b) status post anastrozole for 5 years completed June 2010  (2) status post right breast upper outer quadrant biopsy 12/21/2014 for ductal carcinoma in situ, grade 2 or 3, estrogen and progesterone receptor positive.  (3) Status post right lumpectomy 02/11/2015 showing ductal carcinoma in situ, intermediate grade, measuring 0.9 cm, with close but negative margins  (4) adjuvant radiation: 03/07/2015-04/04/2015  Right breast/ 42.5 Gy at 2.5 Gy per fraction x 17 fractions.  Right breast boost/ 7.5 Gy at 2.5 Gy per fraction x 3 fractions  (5) genetic testing 01/14/2015 through the [place] Panel offered by [facility] [name], MD) showed no deleterious mutations in ATM, BARD1, BRCA1, BRCA2, BRIP1, CDH1, CHEK2, FANCC, MLH1, MSH2, MSH6, NBN, PALB2, PMS2, PTEN, RAD51C, RAD51D, TP53, and XRCC2. This panel also includes deletion/duplication analysis (without sequencing) for one gene, EPCAM.  (6) resumed anastrozole 07/06/2014, discontinued March 2017 with arthralgias and myalgias developing   (a) started letrozole 10/14/2015--discontinued after a brief course secondary to side effects  (b) Bone density 03/29/2015 was normal, with the lowest T score at 0.4   PLAN: Elizabeth Ramirez is now 3 years out from definitive surgery for her noninvasive breast cancer, with no evidence of disease activity.  This is very favorable.  She is running into significant orthopedic problems.  I advised her to go ahead and get her shoulders done and then work on her knees.  She would like to take a long trip next year with Elizabeth Ramirez and she is not going to be  able to do it unless she gets herself into better shape  From a cancer point of view though she is doing terrific.  She will see me again in 1 year.  She knows to call for any other issues that may develop before then.  Harutyun Monteverde, Virgie Dad, MD  01/17/18 11:43 AM Medical Oncology and Hematology [facility] [address] [place],  [address] Tel. [phone]    Fax. [phone]    I, Soijett Blue am acting as scribe for Dr. Sarajane Jews C. Leaann Nevils.  I, Lurline Del MD, have reviewed the above documentation for accuracy and completeness, and I agree with the above.

## 2018-01-17 ENCOUNTER — Telehealth: Payer: Self-pay | Admitting: Oncology

## 2018-01-17 ENCOUNTER — Inpatient Hospital Stay: Payer: Medicare Other | Attending: Oncology | Admitting: Oncology

## 2018-01-17 DIAGNOSIS — Z87442 Personal history of urinary calculi: Secondary | ICD-10-CM

## 2018-01-17 DIAGNOSIS — M1710 Unilateral primary osteoarthritis, unspecified knee: Secondary | ICD-10-CM | POA: Diagnosis not present

## 2018-01-17 DIAGNOSIS — R011 Cardiac murmur, unspecified: Secondary | ICD-10-CM | POA: Insufficient documentation

## 2018-01-17 DIAGNOSIS — E039 Hypothyroidism, unspecified: Secondary | ICD-10-CM | POA: Diagnosis not present

## 2018-01-17 DIAGNOSIS — M129 Arthropathy, unspecified: Secondary | ICD-10-CM | POA: Diagnosis not present

## 2018-01-17 DIAGNOSIS — E559 Vitamin D deficiency, unspecified: Secondary | ICD-10-CM | POA: Diagnosis not present

## 2018-01-17 DIAGNOSIS — Z8041 Family history of malignant neoplasm of ovary: Secondary | ICD-10-CM | POA: Diagnosis not present

## 2018-01-17 DIAGNOSIS — Z87891 Personal history of nicotine dependence: Secondary | ICD-10-CM | POA: Diagnosis not present

## 2018-01-17 DIAGNOSIS — Z170 Estrogen receptor positive status [ER+]: Secondary | ICD-10-CM | POA: Diagnosis not present

## 2018-01-17 DIAGNOSIS — D0511 Intraductal carcinoma in situ of right breast: Secondary | ICD-10-CM | POA: Diagnosis not present

## 2018-01-17 DIAGNOSIS — Z923 Personal history of irradiation: Secondary | ICD-10-CM | POA: Diagnosis not present

## 2018-01-17 DIAGNOSIS — Z79811 Long term (current) use of aromatase inhibitors: Secondary | ICD-10-CM | POA: Insufficient documentation

## 2018-01-17 DIAGNOSIS — Z800 Family history of malignant neoplasm of digestive organs: Secondary | ICD-10-CM | POA: Diagnosis not present

## 2018-01-17 DIAGNOSIS — C50411 Malignant neoplasm of upper-outer quadrant of right female breast: Secondary | ICD-10-CM

## 2018-01-17 NOTE — Telephone Encounter (Signed)
Gave patient avs and calendar.   °

## 2018-03-18 DIAGNOSIS — M19011 Primary osteoarthritis, right shoulder: Secondary | ICD-10-CM | POA: Diagnosis not present

## 2018-03-18 DIAGNOSIS — M19012 Primary osteoarthritis, left shoulder: Secondary | ICD-10-CM | POA: Diagnosis not present

## 2018-03-29 DIAGNOSIS — Z853 Personal history of malignant neoplasm of breast: Secondary | ICD-10-CM | POA: Diagnosis not present

## 2018-03-29 DIAGNOSIS — R011 Cardiac murmur, unspecified: Secondary | ICD-10-CM | POA: Diagnosis not present

## 2018-03-29 DIAGNOSIS — E039 Hypothyroidism, unspecified: Secondary | ICD-10-CM | POA: Diagnosis not present

## 2018-03-29 DIAGNOSIS — N183 Chronic kidney disease, stage 3 (moderate): Secondary | ICD-10-CM | POA: Diagnosis not present

## 2018-03-29 DIAGNOSIS — L659 Nonscarring hair loss, unspecified: Secondary | ICD-10-CM | POA: Diagnosis not present

## 2018-03-29 DIAGNOSIS — E559 Vitamin D deficiency, unspecified: Secondary | ICD-10-CM | POA: Diagnosis not present

## 2018-03-29 DIAGNOSIS — Z86010 Personal history of colonic polyps: Secondary | ICD-10-CM | POA: Diagnosis not present

## 2018-03-29 DIAGNOSIS — B009 Herpesviral infection, unspecified: Secondary | ICD-10-CM | POA: Diagnosis not present

## 2018-03-29 DIAGNOSIS — Z0000 Encounter for general adult medical examination without abnormal findings: Secondary | ICD-10-CM | POA: Diagnosis not present

## 2018-03-29 DIAGNOSIS — Z131 Encounter for screening for diabetes mellitus: Secondary | ICD-10-CM | POA: Diagnosis not present

## 2018-03-29 DIAGNOSIS — E669 Obesity, unspecified: Secondary | ICD-10-CM | POA: Diagnosis not present

## 2018-03-29 DIAGNOSIS — Z23 Encounter for immunization: Secondary | ICD-10-CM | POA: Diagnosis not present

## 2018-04-01 DIAGNOSIS — Z23 Encounter for immunization: Secondary | ICD-10-CM | POA: Diagnosis not present

## 2018-04-05 DIAGNOSIS — M19011 Primary osteoarthritis, right shoulder: Secondary | ICD-10-CM | POA: Diagnosis not present

## 2018-04-05 DIAGNOSIS — B009 Herpesviral infection, unspecified: Secondary | ICD-10-CM | POA: Diagnosis not present

## 2018-04-05 DIAGNOSIS — D0511 Intraductal carcinoma in situ of right breast: Secondary | ICD-10-CM | POA: Diagnosis not present

## 2018-04-05 DIAGNOSIS — E039 Hypothyroidism, unspecified: Secondary | ICD-10-CM | POA: Diagnosis not present

## 2018-04-05 DIAGNOSIS — E782 Mixed hyperlipidemia: Secondary | ICD-10-CM | POA: Diagnosis not present

## 2018-04-05 DIAGNOSIS — Z0000 Encounter for general adult medical examination without abnormal findings: Secondary | ICD-10-CM | POA: Diagnosis not present

## 2018-04-05 DIAGNOSIS — E559 Vitamin D deficiency, unspecified: Secondary | ICD-10-CM | POA: Diagnosis not present

## 2018-04-05 DIAGNOSIS — N183 Chronic kidney disease, stage 3 (moderate): Secondary | ICD-10-CM | POA: Diagnosis not present

## 2018-04-05 DIAGNOSIS — J309 Allergic rhinitis, unspecified: Secondary | ICD-10-CM | POA: Diagnosis not present

## 2018-04-05 DIAGNOSIS — Z1331 Encounter for screening for depression: Secondary | ICD-10-CM | POA: Diagnosis not present

## 2018-04-05 DIAGNOSIS — R0989 Other specified symptoms and signs involving the circulatory and respiratory systems: Secondary | ICD-10-CM | POA: Diagnosis not present

## 2018-04-05 DIAGNOSIS — E669 Obesity, unspecified: Secondary | ICD-10-CM | POA: Diagnosis not present

## 2018-04-05 DIAGNOSIS — M19012 Primary osteoarthritis, left shoulder: Secondary | ICD-10-CM | POA: Diagnosis not present

## 2018-04-07 ENCOUNTER — Other Ambulatory Visit: Payer: Self-pay | Admitting: Family Medicine

## 2018-04-07 DIAGNOSIS — Z780 Asymptomatic menopausal state: Secondary | ICD-10-CM

## 2018-04-07 DIAGNOSIS — E2839 Other primary ovarian failure: Secondary | ICD-10-CM

## 2018-04-29 DIAGNOSIS — Z6841 Body Mass Index (BMI) 40.0 and over, adult: Secondary | ICD-10-CM | POA: Diagnosis not present

## 2018-04-29 DIAGNOSIS — M25562 Pain in left knee: Secondary | ICD-10-CM | POA: Diagnosis not present

## 2018-04-29 DIAGNOSIS — M1712 Unilateral primary osteoarthritis, left knee: Secondary | ICD-10-CM | POA: Diagnosis not present

## 2018-05-02 ENCOUNTER — Ambulatory Visit
Admission: RE | Admit: 2018-05-02 | Discharge: 2018-05-02 | Disposition: A | Discharge status: Home / Self Care | Payer: Medicare Other | Source: Ambulatory Visit | Attending: Family Medicine | Admitting: Family Medicine

## 2018-05-02 DIAGNOSIS — Z13820 Encounter for screening for osteoporosis: Secondary | ICD-10-CM | POA: Diagnosis not present

## 2018-05-02 DIAGNOSIS — E2839 Other primary ovarian failure: Secondary | ICD-10-CM

## 2018-05-02 DIAGNOSIS — Z780 Asymptomatic menopausal state: Secondary | ICD-10-CM | POA: Diagnosis not present

## 2018-05-09 DIAGNOSIS — M19011 Primary osteoarthritis, right shoulder: Secondary | ICD-10-CM | POA: Diagnosis not present

## 2018-05-09 DIAGNOSIS — M19012 Primary osteoarthritis, left shoulder: Secondary | ICD-10-CM | POA: Diagnosis not present

## 2018-07-04 DIAGNOSIS — N390 Urinary tract infection, site not specified: Secondary | ICD-10-CM | POA: Diagnosis not present

## 2018-07-12 ENCOUNTER — Encounter (HOSPITAL_COMMUNITY): Payer: Self-pay

## 2018-07-12 ENCOUNTER — Encounter (HOSPITAL_COMMUNITY)
Admission: RE | Admit: 2018-07-12 | Discharge: 2018-07-12 | Disposition: A | Discharge status: Home / Self Care | Payer: Medicare Other | Source: Ambulatory Visit | Attending: Orthopedic Surgery | Admitting: Orthopedic Surgery

## 2018-07-12 ENCOUNTER — Other Ambulatory Visit: Payer: Self-pay

## 2018-07-12 DIAGNOSIS — Z01818 Encounter for other preprocedural examination: Secondary | ICD-10-CM

## 2018-07-12 LAB — BASIC METABOLIC PANEL
ANION GAP: 7 (ref 5–15)
BUN: 24 mg/dL — AB (ref 8–23)
CHLORIDE: 104 mmol/L (ref 98–111)
CO2: 28 mmol/L (ref 22–32)
Calcium: 9.4 mg/dL (ref 8.9–10.3)
Creatinine, Ser: 1.15 mg/dL — ABNORMAL HIGH (ref 0.44–1.00)
GFR, EST AFRICAN AMERICAN: 55 mL/min — AB (ref >60)
GFR, EST NON AFRICAN AMERICAN: 47 mL/min — AB (ref >60)
Glucose, Bld: 99 mg/dL (ref 70–99)
POTASSIUM: 4.5 mmol/L (ref 3.5–5.1)
Sodium: 139 mmol/L (ref 135–145)

## 2018-07-12 LAB — CBC
HEMATOCRIT: 42.4 % (ref 36.0–46.0)
HEMOGLOBIN: 13.9 g/dL (ref 12.0–15.0)
MCH: 29.8 pg (ref 26.0–34.0)
MCHC: 32.8 g/dL (ref 30.0–36.0)
MCV: 90.8 fL (ref 80.0–100.0)
NRBC: 0 % (ref 0.0–0.2)
Platelets: 159 10*3/uL (ref 150–400)
RBC: 4.67 MIL/uL (ref 3.87–5.11)
RDW: 13.9 % (ref 11.5–15.5)
WBC: 4.3 10*3/uL (ref 4.0–10.5)

## 2018-07-12 LAB — SURGICAL PCR SCREEN
MRSA, PCR: NEGATIVE
Staphylococcus aureus: NEGATIVE

## 2018-07-12 NOTE — Patient Instructions (Addendum)
Elizabeth Ramirez  07/12/2018   Your procedure is scheduled on: 07-14-2018   Report to [facility] Main  Entrance    Report to admitting at 7:30AM    Call this number if you have problems the morning of surgery [phone]      Remember: Do not eat food or drink liquids :After Midnight. BRUSH YOUR TEETH MORNING OF SURGERY AND RINSE YOUR MOUTH OUT, NO CHEWING GUM CANDY OR MINTS.     Take these medicines the morning of surgery with A SIP OF WATER: nasal spray , levothyroxine                                 You may not have any metal on your body including hair pins and              piercings  Do not wear jewelry, make-up, lotions, powders or perfumes, deodorant             Do not wear nail polish.  Do not shave  48 hours prior to surgery.         Do not bring valuables to the hospital. [place].  Contacts, dentures or bridgework may not be worn into surgery.  Leave suitcase in the car. After surgery it may be brought to your room.                   Please read over the following fact sheets you were given: _____________________________________________________________________             [facility] - Preparing for Surgery Before surgery, you can play an important role.  Because skin is not sterile, your skin needs to be as free of germs as possible.  You can reduce the number of germs on your skin by washing with CHG (chlorahexidine gluconate) soap before surgery.  CHG is an antiseptic cleaner which kills germs and bonds with the skin to continue killing germs even after washing. Please DO NOT use if you have an allergy to CHG or antibacterial soaps.  If your skin becomes reddened/irritated stop using the CHG and inform your nurse when you arrive at Short Stay. Do not shave (including legs and underarms) for at least 48 hours prior to the first CHG shower.  You may shave your face/neck. Please follow  these instructions carefully:  1.  Shower with CHG Soap the night before surgery and the  morning of Surgery.  2.  If you choose to wash your hair, wash your hair first as usual with your  normal  shampoo.  3.  After you shampoo, rinse your hair and body thoroughly to remove the  shampoo.                           4.  Use CHG as you would any other liquid soap.  You can apply chg directly  to the skin and wash                       Gently with a scrungie or clean washcloth.  5.  Apply the CHG Soap to your body ONLY FROM THE NECK DOWN.   Do  not use on face/ open                           Wound or open sores. Avoid contact with eyes, ears mouth and genitals (private parts).                       Wash face,  Genitals (private parts) with your normal soap.             6.  Wash thoroughly, paying special attention to the area where your surgery  will be performed.  7.  Thoroughly rinse your body with warm water from the neck down.  8.  DO NOT shower/wash with your normal soap after using and rinsing off  the CHG Soap.                9.  Pat yourself dry with a clean towel.            10.  Wear clean pajamas.            11.  Place clean sheets on your bed the night of your first shower and do not  sleep with pets. Day of Surgery : Do not apply any lotions/deodorants the morning of surgery.  Please wear clean clothes to the hospital/surgery center.  FAILURE TO FOLLOW THESE INSTRUCTIONS MAY RESULT IN THE CANCELLATION OF YOUR SURGERY PATIENT SIGNATURE_________________________________  NURSE SIGNATURE__________________________________  ________________________________________________________________________   Adam Phenix  An incentive spirometer is a tool that can help keep your lungs clear and active. This tool measures how well you are filling your lungs with each breath. Taking long deep breaths may help reverse or decrease the chance of developing breathing (pulmonary) problems  (especially infection) following:  A long period of time when you are unable to move or be active. BEFORE THE PROCEDURE   If the spirometer includes an indicator to show your best effort, your nurse or respiratory therapist will set it to a desired goal.  If possible, sit up straight or lean slightly forward. Try not to slouch.  Hold the incentive spirometer in an upright position. INSTRUCTIONS FOR USE  1. Sit on the edge of your bed if possible, or sit up as far as you can in bed or on a chair. 2. Hold the incentive spirometer in an upright position. 3. Breathe out normally. 4. Place the mouthpiece in your mouth and seal your lips tightly around it. 5. Breathe in slowly and as deeply as possible, raising the piston or the ball toward the top of the column. 6. Hold your breath for 3-5 seconds or for as long as possible. Allow the piston or ball to fall to the bottom of the column. 7. Remove the mouthpiece from your mouth and breathe out normally. 8. Rest for a few seconds and repeat Steps 1 through 7 at least 10 times every 1-2 hours when you are awake. Take your time and take a few normal breaths between deep breaths. 9. The spirometer may include an indicator to show your best effort. Use the indicator as a goal to work toward during each repetition. 10. After each set of 10 deep breaths, practice coughing to be sure your lungs are clear. If you have an incision (the cut made at the time of surgery), support your incision when coughing by placing a pillow or rolled up towels firmly against it. Once you are able to get out of bed,  walk around indoors and cough well. You may stop using the incentive spirometer when instructed by your caregiver.  RISKS AND COMPLICATIONS  Take your time so you do not get dizzy or light-headed.  If you are in pain, you may need to take or ask for pain medication before doing incentive spirometry. It is harder to take a deep breath if you are having  pain. AFTER USE  Rest and breathe slowly and easily.  It can be helpful to keep track of a log of your progress. Your caregiver can provide you with a simple table to help with this. If you are using the spirometer at home, follow these instructions: [place] IF:   You are having difficultly using the spirometer.  You have trouble using the spirometer as often as instructed.  Your pain medication is not giving enough relief while using the spirometer.  You develop fever of 100.5 F (38.1 C) or higher. SEEK IMMEDIATE MEDICAL CARE IF:   You cough up bloody sputum that had not been present before.  You develop fever of 102 F (38.9 C) or greater.  You develop worsening pain at or near the incision site. MAKE SURE YOU:   Understand these instructions.  Will watch your condition.  Will get help right away if you are not doing well or get worse. Document Released: 10/12/2006 Document Revised: 08/24/2011 Document Reviewed: 12/13/2006 [facility] Patient Information 2014 [place], [place].   ________________________________________________________________________

## 2018-07-13 NOTE — Progress Notes (Signed)
Anesthesia Chart Review   Case:  [id number] Date/Time:  07/14/18 0945   Procedure:  REVERSE SHOULDER ARTHROPLASTY (Left ) - 149mn   Anesthesia type:  General   Pre-op diagnosis:  left shoulder osteoarthritis   Location:  [facility] ROOM 07 / [facility]   Surgeon:  SJustice Britain MD      DISCUSSION: 73yo former smoker (9 pack years, quit 06/16/83) with h/o PONV, breast cancer 2016 without recurrence, hypothyroidism, left shoulder OA scheduled for above surgery on 07/14/2018 with Dr. KJustice Britain   Previously seen by cardiology, Dr. PMertie Moores on 04/26/17 for consult regarding soft systolic murmur.  Echo 05/04/2017 with EF 633-83% mild diastolic dysfunction, mild mitral and tricuspid regurgitation.  Pt asymptomatic, advised to follow up prn.    Pt seen by myself during PST visit on 07/14/18.  Pt is concerned regarding memory loss post anesthesia which she states happened with her elderly mother.  Discussed nature of case and risk regarding cognitive impairments post operatively in depth today, pt reassured. 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  Assured pt blood pressure would be monitored appropriately during surgery.  She is also concerned regarding PONV, reports she cannot use scopolamine patches, reports adverse reaction in the past.    Pt will proceed with planned procedure barring acute status change.  VS: BP (!) 160/105   Pulse 79   Temp 36.6 C (Oral)   Resp 16   SpO2 98%   PROVIDERS: MCari Caraway MD is PCP   MLurline Del MD with Medical Oncology  Nahser, PArnette Norris MD is Cardiologist  LABS: Labs reviewed: Acceptable for surgery. (all labs ordered are listed, but only abnormal results are displayed)  Labs Reviewed  BASIC METABOLIC PANEL - Abnormal; Notable for the following components:      Result Value   BUN 24 (*)    Creatinine, Ser 1.15 (*)    GFR calc non Af Amer 47 (*)    GFR calc Af  Amer 55 (*)    All other components within normal limits  SURGICAL PCR SCREEN  CBC     IMAGES:   EKG: 07/12/2018 Rate 74 bpm Normal sinus rhythm Possible Left atrial enlargement New since previous tracing in 2008, LAE is new.  Otherwise normal ECG  CV: Echo 05/04/2017 Study Conclusions  - Left ventricle: The cavity size was normal. There was moderate   concentric hypertrophy. Systolic function was normal. The   estimated ejection fraction was in the range of 60% to 65%. Wall   motion was normal; there were no regional wall motion   abnormalities. Doppler parameters are consistent with abnormal   left ventricular relaxation (grade 1 diastolic dysfunction).   Doppler parameters are consistent with elevated ventricular   end-diastolic filling pressure. - Aortic valve: There was trivial regurgitation. - Mitral valve: There was mild regurgitation. - Left atrium: The atrium was mildly dilated. - Right ventricle: The cavity size was normal. Wall thickness was   normal. Systolic function was normal. - Right atrium: The atrium was normal in size. - Tricuspid valve: There was mild regurgitation. - Pulmonary arteries: Systolic pressure was mildly increased. PA   peak pressure: 33 mm Hg (S). - Inferior vena cava: The vessel was normal in size. - Pericardium, extracardiac: There was no pericardial effusion. Past Medical History:  Diagnosis Date  . Allergy    tetracycline, levofloxacin, crabs & bay leaves  . Arthritis    Rt  knee, Rt shoulder  . 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 Cancer ([place])   . Ear infection 10/19/2013  . Genetic testing 01/18/2015   Negative - no mutations found in any of 20 genes on the Breast/Ovarian Cancer Panel.  Additionally, no variants of uncertain significance (VUSs) were found.  The Breast/Ovarian gene panel offered by [facility] [name],  MD) includes sequencing and deletion/duplication analysis for the following 19 genes:  ATM, BARD1, BRCA1, BRCA2, BRIP1, CDH1, CHEK2, FANCC, MLH1, MSH2, MSH6, NBN, PALB2, PMS2, PTEN, RAD51C, RAD51D, TP53, and XRCC2.  This panel also includes deletion/duplication analysis (without sequencing) for one gene, EPCAM.  Date of report is January 14, 2015.    Marland Kitchen Heart murmur   . History of kidney stones    x1 '71  . Hypothyroid 07/29/2011  . Hypothyroidism   . Malignant neoplasm of upper-outer quadrant of right breast in female, estrogen receptor positive ([place]) 01/14/2015  . OA (osteoarthritis) of knee 05/27/2015  . Obesity, morbid, BMI 40.0-49.9 ([place]) 12/31/2015  . Other chronic pain   . Personal history of radiation therapy   . PONV (postoperative nausea and vomiting)    extreme nausea and vomiting - cannot tolerate scopalamine   . 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 Seasonal allergies   . Thyroid disease   . Vitamin D deficiency     Past Surgical History:  Procedure Laterality Date  . APPENDECTOMY  08/23/2006  . BREAST LUMPECTOMY Right 09/2003   DCIS  . BREAST LUMPECTOMY Right 2016  . BREAST LUMPECTOMY WITH NEEDLE LOCALIZATION Right 02/11/2015   Procedure: WIRE BRACKETED RIGHT BREAST LUMPECTOMY ;  Surgeon: Excell Seltzer, MD;  Location: [facility];  Service: General;  Laterality: Right;  . CATARACT EXTRACTION, BILATERAL  10-05-2017 left ; 11-09-2017 right   Dr Kandy Garrison   . colonoscopy with biopsy  12/28/2003   neg  . TONSILLECTOMY     1954  . TOTAL KNEE ARTHROPLASTY Right 05/27/2015   Procedure: RIGHT TOTAL KNEE ARTHROPLASTY;  Surgeon: Gaynelle Arabian, MD;  [facility];  Service: Orthopedics;  Laterality: Right;  . WISDOM TOOTH EXTRACTION Bilateral 1965    MEDICATIONS: . sulfamethoxazole-trimethoprim (BACTRIM DS,SEPTRA DS) 800-160 MG tablet  . Calcium Carbonate-Vitamin D (CALTRATE 600+D PO)  . ipratropium (ATROVENT) 0.03 % nasal spray  . levothyroxine  (SYNTHROID, LEVOTHROID) 137 MCG tablet  . naproxen sodium (ALEVE) 220 MG tablet   No current facility-administered medications for this encounter.     Maia Plan [facility]Surgical Testing  [phone] 07/13/18 11:05 AM

## 2018-07-13 NOTE — Anesthesia Preprocedure Evaluation (Addendum)
Anesthesia Evaluation  Patient identified by MRN, date of birth, ID band Patient awake    Reviewed: Allergy & Precautions, NPO status , Patient's Chart, lab work & pertinent test results  History of Anesthesia Complications (+) PONV and history of anesthetic complications  Airway Mallampati: II  TM Distance: >3 FB Neck ROM: Full    Dental  (+) Dental Advisory Given   Pulmonary neg pulmonary ROS, former smoker,    Pulmonary exam normal breath sounds clear to auscultation       Cardiovascular Normal cardiovascular exam+ Valvular Problems/Murmurs  Rhythm:Regular Rate:Normal     Neuro/Psych negative neurological ROS  negative psych ROS   GI/Hepatic negative GI ROS, Neg liver ROS,   Endo/Other  Hypothyroidism Morbid obesity  Renal/GU negative Renal ROS     Musculoskeletal  (+) Arthritis ,   Abdominal (+) + obese,   Peds  Hematology negative hematology ROS (+)   Anesthesia Other Findings   Reproductive/Obstetrics negative OB ROS                                                              Anesthesia Evaluation  Patient identified by MRN, date of birth, ID band Patient awake    Reviewed: Allergy & Precautions, NPO status , Patient's Chart, lab work & pertinent test results  History of Anesthesia Complications (+) PONV and history of anesthetic complications  Airway Mallampati: II  TM Distance: >3 FB Neck ROM: Full    Dental  (+) Teeth Intact   Pulmonary former smoker,    breath sounds clear to auscultation       Cardiovascular negative cardio ROS   Rhythm:Regular Rate:Normal     Neuro/Psych negative neurological ROS  negative psych ROS   GI/Hepatic negative GI ROS, Neg liver ROS,   Endo/Other  Hypothyroidism   Renal/GU   negative genitourinary   Musculoskeletal  (+) Arthritis , Osteoarthritis,    Abdominal   Peds negative pediatric ROS (+)   Hematology   Anesthesia Other Findings   Reproductive/Obstetrics negative OB ROS                            Lab Results  Component Value Date   WBC 4.3 07/12/2018   HGB 13.9 07/12/2018   HCT 42.4 07/12/2018   MCV 90.8 07/12/2018   PLT 159 07/12/2018   Lab Results  Component Value Date   INR 0.94 05/20/2015   Lab Results  Component Value Date   CREATININE 1.15 (H) 07/12/2018   BUN 24 (H) 07/12/2018   NA 139 07/12/2018   K 4.5 07/12/2018   CL 104 07/12/2018   CO2 28 07/12/2018     Anesthesia Physical Anesthesia Plan  ASA: III  Anesthesia Plan: Spinal   Post-op Pain Management:    Induction: Intravenous  Airway Management Planned: Natural Airway and Simple Face Mask  Additional Equipment:   Intra-op Plan:   Post-operative Plan:   Informed Consent: I have reviewed the patients History and Physical, chart, labs and discussed the procedure including the risks, benefits and alternatives for the proposed anesthesia with the patient or authorized representative who has indicated his/her understanding and acceptance.     Plan Discussed with: CRNA  Anesthesia Plan Comments:  Anesthesia Quick Evaluation  Anesthesia Physical Anesthesia Plan  ASA: III  Anesthesia Plan: General   Post-op Pain Management: GA combined w/ Regional for post-op pain   Induction: Intravenous  PONV Risk Score and Plan: 4 or greater and Ondansetron, Dexamethasone, Treatment may vary due to age or medical condition, Propofol infusion and Metaclopromide  Airway Management Planned: Oral ETT  Additional Equipment: None  Intra-op Plan:   Post-operative Plan: Extubation in OR  Informed Consent: I have reviewed the patients History and Physical, chart, labs and discussed the procedure including the risks, benefits and alternatives for the proposed anesthesia with the patient or authorized representative who has indicated his/her understanding and  acceptance.     Dental advisory given  Plan Discussed with: CRNA  Anesthesia Plan Comments: (See PST note 07/12/2018, Konrad Felix, PA-C)      Anesthesia Quick Evaluation

## 2018-07-14 ENCOUNTER — Inpatient Hospital Stay (HOSPITAL_COMMUNITY): Payer: Medicare Other | Admitting: Certified Registered Nurse Anesthetist

## 2018-07-14 ENCOUNTER — Encounter (HOSPITAL_COMMUNITY)
Admission: RE | Disposition: A | Discharge status: Home / Self Care | Payer: Self-pay | Source: Home / Self Care | Attending: Orthopedic Surgery

## 2018-07-14 ENCOUNTER — Encounter (HOSPITAL_COMMUNITY): Payer: Self-pay | Admitting: *Deleted

## 2018-07-14 ENCOUNTER — Other Ambulatory Visit: Payer: Self-pay

## 2018-07-14 ENCOUNTER — Inpatient Hospital Stay (HOSPITAL_COMMUNITY): Payer: Medicare Other | Admitting: Physician Assistant

## 2018-07-14 ENCOUNTER — Inpatient Hospital Stay (HOSPITAL_COMMUNITY)
Admission: RE | Admit: 2018-07-14 | Discharge: 2018-07-15 | DRG: 483 | Disposition: A | Discharge status: Home / Self Care | Payer: Medicare Other | Attending: Orthopedic Surgery | Admitting: Orthopedic Surgery

## 2018-07-14 DIAGNOSIS — Z6841 Body Mass Index (BMI) 40.0 and over, adult: Secondary | ICD-10-CM

## 2018-07-14 DIAGNOSIS — E039 Hypothyroidism, unspecified: Secondary | ICD-10-CM | POA: Diagnosis present

## 2018-07-14 DIAGNOSIS — M19012 Primary osteoarthritis, left shoulder: Secondary | ICD-10-CM | POA: Diagnosis not present

## 2018-07-14 DIAGNOSIS — Z96651 Presence of right artificial knee joint: Secondary | ICD-10-CM | POA: Diagnosis present

## 2018-07-14 DIAGNOSIS — Z833 Family history of diabetes mellitus: Secondary | ICD-10-CM | POA: Diagnosis not present

## 2018-07-14 DIAGNOSIS — Z79890 Hormone replacement therapy (postmenopausal): Secondary | ICD-10-CM | POA: Diagnosis not present

## 2018-07-14 DIAGNOSIS — G8918 Other acute postprocedural pain: Secondary | ICD-10-CM | POA: Diagnosis not present

## 2018-07-14 DIAGNOSIS — Z96612 Presence of left artificial shoulder joint: Secondary | ICD-10-CM

## 2018-07-14 DIAGNOSIS — Z853 Personal history of malignant neoplasm of breast: Secondary | ICD-10-CM

## 2018-07-14 DIAGNOSIS — Z87891 Personal history of nicotine dependence: Secondary | ICD-10-CM | POA: Diagnosis not present

## 2018-07-14 HISTORY — PX: REVERSE SHOULDER ARTHROPLASTY: SHX5054

## 2018-07-14 SURGERY — ARTHROPLASTY, SHOULDER, TOTAL, REVERSE
Anesthesia: General | Site: Shoulder | Laterality: Left

## 2018-07-14 MED ORDER — GLYCOPYRROLATE 0.2 MG/ML IJ SOLN
INTRAMUSCULAR | Status: DC | PRN
  Administered 2018-07-14: 0.2 mg via INTRAVENOUS

## 2018-07-14 MED ORDER — MEPERIDINE HCL 50 MG/ML IJ SOLN: 6.2500 mg | INTRAMUSCULAR | Status: DC | PRN

## 2018-07-14 MED ORDER — CHLORHEXIDINE GLUCONATE 4 % EX LIQD: 60.0000 mL | Freq: Once | CUTANEOUS | Status: DC

## 2018-07-14 MED ORDER — SODIUM CHLORIDE 0.9 % IV SOLN
INTRAVENOUS | Status: DC | PRN
  Administered 2018-07-14: 30 ug/min via INTRAVENOUS

## 2018-07-14 MED ORDER — METOCLOPRAMIDE HCL 5 MG/ML IJ SOLN
INTRAMUSCULAR | Status: DC | PRN
  Administered 2018-07-14: 5 mg via INTRAVENOUS

## 2018-07-14 MED ORDER — PROPOFOL 10 MG/ML IV BOLUS
INTRAVENOUS | Status: AC
  Filled 2018-07-14: qty 40

## 2018-07-14 MED ORDER — SODIUM CHLORIDE 0.9 % IR SOLN
Status: DC | PRN
  Administered 2018-07-14: 500 mL

## 2018-07-14 MED ORDER — METOCLOPRAMIDE HCL 5 MG/ML IJ SOLN
INTRAMUSCULAR | Status: AC
  Filled 2018-07-14: qty 2

## 2018-07-14 MED ORDER — SUCCINYLCHOLINE CHLORIDE 20 MG/ML IJ SOLN
INTRAMUSCULAR | Status: DC | PRN
  Administered 2018-07-14: 140 mg via INTRAVENOUS

## 2018-07-14 MED ORDER — MIDAZOLAM HCL 2 MG/2ML IJ SOLN
1.0000 mg | INTRAMUSCULAR | Status: DC
  Filled 2018-07-14: qty 2

## 2018-07-14 MED ORDER — HYDROMORPHONE HCL 1 MG/ML IJ SOLN: 0.2500 mg | INTRAMUSCULAR | Status: DC | PRN

## 2018-07-14 MED ORDER — PHENYLEPHRINE HCL 10 MG/ML IJ SOLN
INTRAMUSCULAR | Status: DC | PRN
  Administered 2018-07-14: 40 ug via INTRAVENOUS

## 2018-07-14 MED ORDER — EPHEDRINE SULFATE-NACL 50-0.9 MG/10ML-% IV SOSY
PREFILLED_SYRINGE | INTRAVENOUS | Status: DC | PRN
  Administered 2018-07-14: 200 mg via INTRAVENOUS

## 2018-07-14 MED ORDER — ONDANSETRON HCL 4 MG/2ML IJ SOLN
INTRAMUSCULAR | Status: AC
  Filled 2018-07-14: qty 2

## 2018-07-14 MED ORDER — PHENYLEPHRINE 40 MCG/ML (10ML) SYRINGE FOR IV PUSH (FOR BLOOD PRESSURE SUPPORT)
PREFILLED_SYRINGE | INTRAVENOUS | Status: DC | PRN
  Administered 2018-07-14: 80 ug via INTRAVENOUS
  Administered 2018-07-14: 40 ug via INTRAVENOUS

## 2018-07-14 MED ORDER — TRANEXAMIC ACID-NACL 1000-0.7 MG/100ML-% IV SOLN
1000.0000 mg | INTRAVENOUS | Status: AC
  Administered 2018-07-14: 1000 mg via INTRAVENOUS
  Filled 2018-07-14: qty 100

## 2018-07-14 MED ORDER — SUCCINYLCHOLINE CHLORIDE 200 MG/10ML IV SOSY
PREFILLED_SYRINGE | INTRAVENOUS | Status: AC
  Filled 2018-07-14: qty 10

## 2018-07-14 MED ORDER — PROPOFOL 500 MG/50ML IV EMUL
INTRAVENOUS | Status: DC | PRN
  Administered 2018-07-14: 25 ug/kg/min via INTRAVENOUS

## 2018-07-14 MED ORDER — LIDOCAINE 2% (20 MG/ML) 5 ML SYRINGE
INTRAMUSCULAR | Status: DC | PRN
  Administered 2018-07-14: 60 mg via INTRAVENOUS

## 2018-07-14 MED ORDER — FENTANYL CITRATE (PF) 100 MCG/2ML IJ SOLN
50.0000 ug | INTRAMUSCULAR | Status: DC
  Administered 2018-07-14: 100 ug via INTRAVENOUS
  Filled 2018-07-14: qty 2

## 2018-07-14 MED ORDER — PHENYLEPHRINE 40 MCG/ML (10ML) SYRINGE FOR IV PUSH (FOR BLOOD PRESSURE SUPPORT)
PREFILLED_SYRINGE | INTRAVENOUS | Status: AC
  Filled 2018-07-14: qty 10

## 2018-07-14 MED ORDER — MAGNESIUM CITRATE PO SOLN: 1.0000 | Freq: Once | ORAL | Status: DC | PRN

## 2018-07-14 MED ORDER — ROCURONIUM BROMIDE 100 MG/10ML IV SOLN
INTRAVENOUS | Status: AC
  Filled 2018-07-14: qty 1

## 2018-07-14 MED ORDER — METHOCARBAMOL 500 MG IVPB - SIMPLE MED
500.0000 mg | Freq: Four times a day (QID) | INTRAVENOUS | Status: DC | PRN
  Administered 2018-07-14: 500 mg via INTRAVENOUS
  Filled 2018-07-14: qty 50

## 2018-07-14 MED ORDER — FENTANYL CITRATE (PF) 100 MCG/2ML IJ SOLN
INTRAMUSCULAR | Status: AC
  Filled 2018-07-14: qty 2

## 2018-07-14 MED ORDER — CEFAZOLIN SODIUM-DEXTROSE 2-4 GM/100ML-% IV SOLN
2.0000 g | INTRAVENOUS | Status: AC
  Administered 2018-07-14: 2 g via INTRAVENOUS
  Filled 2018-07-14: qty 100

## 2018-07-14 MED ORDER — BISACODYL 5 MG PO TBEC: 5.0000 mg | DELAYED_RELEASE_TABLET | Freq: Every day | ORAL | Status: DC | PRN

## 2018-07-14 MED ORDER — LACTATED RINGERS IV SOLN
INTRAVENOUS | Status: DC
  Administered 2018-07-14: 08:00:00 via INTRAVENOUS

## 2018-07-14 MED ORDER — KETOROLAC TROMETHAMINE 15 MG/ML IJ SOLN
7.5000 mg | Freq: Four times a day (QID) | INTRAMUSCULAR | Status: AC
  Administered 2018-07-14 – 2018-07-15 (×4): 7.5 mg via INTRAVENOUS
  Filled 2018-07-14 (×3): qty 1

## 2018-07-14 MED ORDER — GLYCOPYRROLATE 0.2 MG/ML IJ SOLN
INTRAMUSCULAR | Status: AC
  Filled 2018-07-14: qty 1

## 2018-07-14 MED ORDER — FENTANYL CITRATE (PF) 100 MCG/2ML IJ SOLN
25.0000 ug | INTRAMUSCULAR | Status: DC | PRN
  Administered 2018-07-14 (×3): 50 ug via INTRAVENOUS

## 2018-07-14 MED ORDER — STERILE WATER FOR IRRIGATION IR SOLN
Status: DC | PRN
  Administered 2018-07-14: 1000 mL

## 2018-07-14 MED ORDER — PHENYLEPHRINE HCL 10 MG/ML IJ SOLN
INTRAMUSCULAR | Status: AC
  Filled 2018-07-14: qty 1

## 2018-07-14 MED ORDER — TRAMADOL HCL 50 MG PO TABS: 50.0000 mg | ORAL_TABLET | Freq: Four times a day (QID) | ORAL | Status: DC | PRN

## 2018-07-14 MED ORDER — LIDOCAINE 2% (20 MG/ML) 5 ML SYRINGE
INTRAMUSCULAR | Status: AC
  Filled 2018-07-14: qty 5

## 2018-07-14 MED ORDER — DEXAMETHASONE SODIUM PHOSPHATE 4 MG/ML IJ SOLN
INTRAMUSCULAR | Status: DC | PRN
  Administered 2018-07-14: 8 mg via INTRAVENOUS

## 2018-07-14 MED ORDER — LACTATED RINGERS IV SOLN
INTRAVENOUS | Status: DC
  Administered 2018-07-14 – 2018-07-15 (×2): via INTRAVENOUS

## 2018-07-14 MED ORDER — METHOCARBAMOL 500 MG PO TABS
500.0000 mg | ORAL_TABLET | Freq: Four times a day (QID) | ORAL | Status: DC | PRN
  Administered 2018-07-15: 500 mg via ORAL
  Filled 2018-07-14: qty 1

## 2018-07-14 MED ORDER — ROCURONIUM BROMIDE 10 MG/ML (PF) SYRINGE
PREFILLED_SYRINGE | INTRAVENOUS | Status: DC | PRN
  Administered 2018-07-14: 50 mg via INTRAVENOUS

## 2018-07-14 MED ORDER — ALUM & MAG HYDROXIDE-SIMETH 200-200-20 MG/5ML PO SUSP: 30.0000 mL | ORAL | Status: DC | PRN

## 2018-07-14 MED ORDER — POLYETHYLENE GLYCOL 3350 17 G PO PACK: 17.0000 g | PACK | Freq: Every day | ORAL | Status: DC | PRN

## 2018-07-14 MED ORDER — ONDANSETRON HCL 4 MG/2ML IJ SOLN
INTRAMUSCULAR | Status: AC
  Administered 2018-07-14: 4 mg
  Filled 2018-07-14: qty 2

## 2018-07-14 MED ORDER — DOCUSATE SODIUM 100 MG PO CAPS
100.0000 mg | ORAL_CAPSULE | Freq: Two times a day (BID) | ORAL | Status: DC
  Administered 2018-07-14 – 2018-07-15 (×2): 100 mg via ORAL
  Filled 2018-07-14 (×2): qty 1

## 2018-07-14 MED ORDER — ONDANSETRON HCL 4 MG PO TABS: 4.0000 mg | ORAL_TABLET | Freq: Four times a day (QID) | ORAL | Status: DC | PRN

## 2018-07-14 MED ORDER — ONDANSETRON HCL 4 MG/2ML IJ SOLN
4.0000 mg | Freq: Four times a day (QID) | INTRAMUSCULAR | Status: DC | PRN
  Administered 2018-07-14: 4 mg via INTRAVENOUS
  Filled 2018-07-14: qty 2

## 2018-07-14 MED ORDER — PROMETHAZINE HCL 25 MG/ML IJ SOLN: 6.2500 mg | INTRAMUSCULAR | Status: DC | PRN

## 2018-07-14 MED ORDER — EPHEDRINE 5 MG/ML INJ
INTRAVENOUS | Status: AC
  Filled 2018-07-14: qty 10

## 2018-07-14 MED ORDER — SUCCINYLCHOLINE CHLORIDE 200 MG/10ML IV SOSY
PREFILLED_SYRINGE | INTRAVENOUS | Status: DC | PRN
  Administered 2018-07-14: 140 mg via INTRAVENOUS

## 2018-07-14 MED ORDER — METHOCARBAMOL 500 MG IVPB - SIMPLE MED
INTRAVENOUS | Status: AC
  Filled 2018-07-14: qty 50

## 2018-07-14 MED ORDER — PROPOFOL 10 MG/ML IV BOLUS: INTRAVENOUS | Status: DC | PRN

## 2018-07-14 MED ORDER — PROPOFOL 10 MG/ML IV BOLUS
INTRAVENOUS | Status: DC | PRN
  Administered 2018-07-14: 140 mg via INTRAVENOUS

## 2018-07-14 MED ORDER — HYDROMORPHONE HCL 1 MG/ML IJ SOLN: 0.5000 mg | INTRAMUSCULAR | Status: DC | PRN

## 2018-07-14 MED ORDER — OXYCODONE HCL 5 MG PO TABS
5.0000 mg | ORAL_TABLET | ORAL | Status: DC | PRN
  Administered 2018-07-15: 5 mg via ORAL
  Filled 2018-07-14: qty 1

## 2018-07-14 MED ORDER — DIPHENHYDRAMINE HCL 12.5 MG/5ML PO ELIX: 12.5000 mg | ORAL_SOLUTION | ORAL | Status: DC | PRN

## 2018-07-14 MED ORDER — PHENOL 1.4 % MT LIQD: 1.0000 | OROMUCOSAL | Status: DC | PRN

## 2018-07-14 MED ORDER — ONDANSETRON HCL 4 MG/2ML IJ SOLN
INTRAMUSCULAR | Status: DC | PRN
  Administered 2018-07-14: 4 mg via INTRAVENOUS

## 2018-07-14 MED ORDER — METOCLOPRAMIDE HCL 5 MG/ML IJ SOLN
5.0000 mg | Freq: Three times a day (TID) | INTRAMUSCULAR | Status: DC | PRN
  Administered 2018-07-14: 10 mg via INTRAVENOUS
  Filled 2018-07-14: qty 2

## 2018-07-14 MED ORDER — KETOROLAC TROMETHAMINE 15 MG/ML IJ SOLN
INTRAMUSCULAR | Status: AC
  Filled 2018-07-14: qty 1

## 2018-07-14 MED ORDER — TEMAZEPAM 15 MG PO CAPS: 15.0000 mg | ORAL_CAPSULE | Freq: Every evening | ORAL | Status: DC | PRN

## 2018-07-14 MED ORDER — MENTHOL 3 MG MT LOZG: 1.0000 | LOZENGE | OROMUCOSAL | Status: DC | PRN

## 2018-07-14 MED ORDER — OXYCODONE HCL 5 MG PO TABS
10.0000 mg | ORAL_TABLET | ORAL | Status: DC | PRN
  Administered 2018-07-14 – 2018-07-15 (×3): 10 mg via ORAL
  Filled 2018-07-14 (×3): qty 2

## 2018-07-14 MED ORDER — ACETAMINOPHEN 325 MG PO TABS: 325.0000 mg | ORAL_TABLET | Freq: Four times a day (QID) | ORAL | Status: DC | PRN

## 2018-07-14 MED ORDER — METOCLOPRAMIDE HCL 5 MG PO TABS: 5.0000 mg | ORAL_TABLET | Freq: Three times a day (TID) | ORAL | Status: DC | PRN

## 2018-07-14 MED ORDER — LEVOTHYROXINE SODIUM 25 MCG PO TABS
137.0000 ug | ORAL_TABLET | Freq: Every day | ORAL | Status: DC
  Administered 2018-07-15: 137 ug via ORAL
  Filled 2018-07-14: qty 1

## 2018-07-14 SURGICAL SUPPLY — 64 items
ARTHREX UNIVERS REVERS HUMERAL STEM SIZE 5 - 135 d (Shoulder) ×3 IMPLANT
BAG ZIPLOCK 12X15 (MISCELLANEOUS) ×3 IMPLANT
BLADE SAW SGTL 83.5X18.5 (BLADE) ×3 IMPLANT
CLOSURE WOUND 1/2 X4 (GAUZE/BANDAGES/DRESSINGS)
COVER SURGICAL LIGHT HANDLE (MISCELLANEOUS) ×3 IMPLANT
COVER WAND RF STERILE (DRAPES) IMPLANT
CUP SUT UNIV REVERS 36+2 LEFT (Cup) ×3 IMPLANT
DERMABOND ADVANCED (GAUZE/BANDAGES/DRESSINGS) ×2
DERMABOND ADVANCED .7 DNX12 (GAUZE/BANDAGES/DRESSINGS) ×1 IMPLANT
DRAPE INCISE IOBAN 66X45 STRL (DRAPES) IMPLANT
DRAPE ORTHO SPLIT 77X108 STRL (DRAPES) ×4
DRAPE SURG 17X11 SM STRL (DRAPES) ×3 IMPLANT
DRAPE SURG ORHT 6 SPLT 77X108 (DRAPES) ×2 IMPLANT
DRAPE U-SHAPE 47X51 STRL (DRAPES) ×3 IMPLANT
DRESSING AQUACEL AG SP 3.5X10 (GAUZE/BANDAGES/DRESSINGS) ×1 IMPLANT
DRSG AQUACEL AG ADV 3.5X10 (GAUZE/BANDAGES/DRESSINGS) ×3 IMPLANT
DRSG AQUACEL AG SP 3.5X10 (GAUZE/BANDAGES/DRESSINGS) ×3
DURAPREP 26ML APPLICATOR (WOUND CARE) ×3 IMPLANT
ELECT BLADE TIP CTD 4 INCH (ELECTRODE) ×3 IMPLANT
ELECT REM PT RETURN 15FT ADLT (MISCELLANEOUS) ×3 IMPLANT
FACESHIELD WRAPAROUND (MASK) ×9 IMPLANT
GLENOID UNI REV MOD 24 +2 LAT (Joint) ×3 IMPLANT
GLENOSPHERE 36 +4 LAT/24 (Joint) ×3 IMPLANT
GLOVE BIO SURGEON STRL SZ7.5 (GLOVE) ×3 IMPLANT
GLOVE BIO SURGEON STRL SZ8 (GLOVE) ×3 IMPLANT
GLOVE BIOGEL PI IND STRL 7.0 (GLOVE) ×2 IMPLANT
GLOVE BIOGEL PI IND STRL 7.5 (GLOVE) ×1 IMPLANT
GLOVE BIOGEL PI IND STRL 9 (GLOVE) ×1 IMPLANT
GLOVE BIOGEL PI INDICATOR 7.0 (GLOVE) ×4
GLOVE BIOGEL PI INDICATOR 7.5 (GLOVE) ×2
GLOVE BIOGEL PI INDICATOR 9 (GLOVE) ×2
GLOVE SS BIOGEL STRL SZ 7 (GLOVE) ×1 IMPLANT
GLOVE SS BIOGEL STRL SZ 7.5 (GLOVE) ×1 IMPLANT
GLOVE SUPERSENSE BIOGEL SZ 7 (GLOVE) ×2
GLOVE SUPERSENSE BIOGEL SZ 7.5 (GLOVE) ×2
GLOVE SURG ORTHO 9.0 STRL STRW (GLOVE) ×3 IMPLANT
GOWN SPEC L3 XXLG W/TWL (GOWN DISPOSABLE) ×3 IMPLANT
GOWN STRL REUS W/TWL LRG LVL3 (GOWN DISPOSABLE) ×9 IMPLANT
INSERT HUMERAL 36 +6 (Shoulder) ×3 IMPLANT
KIT BASIN OR (CUSTOM PROCEDURE TRAY) ×3 IMPLANT
KIT TURNOVER KIT A (KITS) IMPLANT
MANIFOLD NEPTUNE II (INSTRUMENTS) ×3 IMPLANT
NEEDLE TAPERED W/ NITINOL LOOP (MISCELLANEOUS) IMPLANT
PACK SHOULDER (CUSTOM PROCEDURE TRAY) ×3 IMPLANT
PAD ARMBOARD 7.5X6 YLW CONV (MISCELLANEOUS) IMPLANT
PIN SET MODULAR GLENOID SYSTEM (PIN) ×3 IMPLANT
PROTECTOR NERVE ULNAR (MISCELLANEOUS) ×3 IMPLANT
SCREW CENTRAL MODULAR 25 (Screw) ×3 IMPLANT
SCREW PERI LOCK 5.5X16 (Screw) ×3 IMPLANT
SCREW PERI LOCK 5.5X24 (Screw) ×6 IMPLANT
SCREW PERIPHERAL 5.5X20 LOCK (Screw) ×3 IMPLANT
SLING ARM FOAM STRAP LRG (SOFTGOODS) ×3 IMPLANT
SPONGE LAP 18X18 RF (DISPOSABLE) IMPLANT
STEM HUMERAL MOD SZ 5 135 DEG (Stem) ×3 IMPLANT
STRIP CLOSURE SKIN 1/2X4 (GAUZE/BANDAGES/DRESSINGS) IMPLANT
SUCTION FRAZIER HANDLE 12FR (TUBING) ×2
SUCTION TUBE FRAZIER 12FR DISP (TUBING) ×1 IMPLANT
SUT MNCRL AB 3-0 PS2 18 (SUTURE) ×3 IMPLANT
SUT MON AB 2-0 CT1 36 (SUTURE) ×3 IMPLANT
SUT VIC AB 1 CT1 36 (SUTURE) ×3 IMPLANT
SUTURE TAPE 1.3 40 TPR END (SUTURE) ×1 IMPLANT
SUTURETAPE 1.3 40 TPR END (SUTURE) ×3
TOWEL OR 17X26 10 PK STRL BLUE (TOWEL DISPOSABLE) ×6 IMPLANT
TOWEL OR NON WOVEN STRL DISP B (DISPOSABLE) ×3 IMPLANT

## 2018-07-14 NOTE — Discharge Instructions (Signed)
° °Kevin M. Supple, M.D., F.A.A.O.S. °Orthopaedic Surgery °Specializing in Arthroscopic and Reconstructive °Surgery of the Shoulder °[phone] °[address] - [place], [place] [address] - Fax [phone] ° ° °POST-OP TOTAL SHOULDER REPLACEMENT INSTRUCTIONS ° °1. Call the office at [phone] to schedule your first post-op appointment 10-14 days from the date of your surgery. ° °2. The bandage over your incision is waterproof. You may begin showering with this dressing on. You may leave this dressing on until first follow up appointment within 2 weeks. We prefer you leave this dressing in place until follow up however after 5-7 days if you are having itching or skin irritation and would like to remove it you may do so. Go slow and tug at the borders gently to break the bond the dressing has with the skin. At this point if there is no drainage it is okay to go without a bandage or you may cover it with a light guaze and tape. You can also expect significant bruising around your shoulder that will drift down your arm and into your chest wall. This is very normal and should resolve over several days. ° ° 3. Wear your sling/immobilizer at all times except to perform the exercises below or to occasionally let your arm dangle by your side to stretch your elbow. You also need to sleep in your sling immobilizer until instructed otherwise. It is ok to remove your sling if you are sitting in a controlled environment and allow your arm to rest in a position of comfort by your side or on your lap with pillows to give your neck and skin a break from the sling. You may remove it to allow arm to dangle by side to shower. If you are up walking around and when you go to sleep at night you need to wear it. ° °4. Range of motion to your elbow, wrist, and hand are encouraged 3-5 times daily. Exercise to your hand and fingers helps to reduce swelling you may experience. ° °5. Utilize ice to the shoulder 3-5 times  minimum a day and additionally if you are experiencing pain. ° °6. Prescriptions for a pain medication and a muscle relaxant are provided for you. It is recommended that if you are experiencing pain that you pain medication alone is not controlling, add the muscle relaxant along with the pain medication which can give additional pain relief. The first 1-2 days is generally the most severe of your pain and then should gradually decrease. As your pain lessens it is recommended that you decrease your use of the pain medications to an "as needed basis'" only and to always comply with the recommended dosages of the pain medications. ° °7. Pain medications can produce constipation along with their use. If you experience this, the use of an over the counter stool softener or laxative daily is recommended.  ° °8. For additional questions or concerns, please do not hesitate to call the office. If after hours there is an answering service to forward your concerns to the physician on call. ° °9.Pain control following an exparel block ° °To help control your post-operative pain you received a nerve block  performed with Exparel which is a long acting anesthetic (numbing agent) which can provide pain relief and sensations of numbness (and relief of pain) in the operative shoulder and arm for up to 3 days. Sometimes it provides mixed relief, meaning you may still have numbness in certain areas of the arm but can still   be able to move  parts of that arm, hand, and fingers. We recommend that your prescribed pain medications  be used as needed. We do not feel it is necessary to "pre medicate" and "stay ahead" of pain.  Taking narcotic pain medications when you are not having any pain can lead to unnecessary and potentially dangerous side effects.   ° °POST-OP EXERCISES ° °Pendulum Exercises ° °Perform pendulum exercises while standing and bending at the waist. Support your uninvolved arm on a table or chair and allow your operated  arm to hang freely. Make sure to do these exercises passively - not using you shoulder muscles. ° °Repeat 20 times. Do 3 sessions per day. ° ° ° ° °

## 2018-07-14 NOTE — H&P (Signed)
Elizabeth Ramirez    Chief Complaint: left shoulder osteoarthritis HPI: The patient is a 73 y.o. 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 Allergy    tetracycline, levofloxacin, crabs & bay leaves  . Arthritis    Rt knee, Rt shoulder  . 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 Cancer ([place])   . Ear infection 10/19/2013  . Genetic testing 01/18/2015   Negative - no mutations found in any of 20 genes on the Breast/Ovarian Cancer Panel.  Additionally, no variants of uncertain significance (VUSs) were found.  The Breast/Ovarian gene panel offered by [facility] [name], MD) includes sequencing and deletion/duplication analysis for the following 19 genes:  ATM, BARD1, BRCA1, BRCA2, BRIP1, CDH1, CHEK2, FANCC, MLH1, MSH2, MSH6, NBN, PALB2, PMS2, PTEN, RAD51C, RAD51D, TP53, and XRCC2.  This panel also includes deletion/duplication analysis (without sequencing) for one gene, EPCAM.  Date of report is January 14, 2015.    Marland Kitchen Heart murmur   . History of kidney stones    x1 '71  . Hypothyroid 07/29/2011  . Hypothyroidism   . Malignant neoplasm of upper-outer quadrant of right breast in female, estrogen receptor positive ([place]) 01/14/2015  . OA (osteoarthritis) of knee 05/27/2015  . Obesity, morbid, BMI 40.0-49.9 ([place]) 12/31/2015  . Other chronic pain   . Personal history of radiation therapy   . PONV (postoperative nausea and vomiting)    extreme nausea and vomiting - cannot tolerate scopalamine   . 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 Seasonal allergies   . Thyroid disease   . Vitamin D deficiency     Past Surgical History:  Procedure Laterality Date  . APPENDECTOMY  08/23/2006  . BREAST LUMPECTOMY Right 09/2003   DCIS  . BREAST LUMPECTOMY Right 2016  . BREAST LUMPECTOMY WITH NEEDLE LOCALIZATION Right 02/11/2015   Procedure: WIRE BRACKETED RIGHT BREAST LUMPECTOMY ;  Surgeon: Excell Seltzer, MD;  Location: [facility];  Service: General;  Laterality: Right;  . CATARACT EXTRACTION, BILATERAL  10-05-2017 left ; 11-09-2017 right   Dr Kandy Garrison   . colonoscopy with biopsy  12/28/2003   neg  . TONSILLECTOMY     1954  . TOTAL KNEE ARTHROPLASTY Right 05/27/2015   Procedure: RIGHT TOTAL KNEE ARTHROPLASTY;  Surgeon: Gaynelle Arabian, MD;  [facility];  Service: Orthopedics;  Laterality: Right;  . WISDOM TOOTH EXTRACTION Bilateral 1965    Family History  Problem Relation Age of Onset  . Dementia Mother   . Other Mother 27       "spot on lung"; secondhand smoke exposure  . Pulmonary fibrosis Father   . Diabetes Brother   . Thyroid disease Brother   . Leukemia Brother 70       chronic lymphocytic   . Stomach cancer Maternal Grandmother        dx. 74s  . Heart attack Maternal Grandfather   . Diabetes Paternal Grandfather   . Stroke Paternal Grandfather   . Colitis Paternal Grandmother   . Parkinson's disease Maternal Aunt   . Diabetes Paternal Aunt   . Other Paternal Aunt        bowel obstruction  . Heart Problems Paternal Uncle   . Ovarian cancer Maternal Aunt        dx. 58s; paternal half sister of her mother  . Multiple myeloma Maternal  Aunt   . Multiple myeloma Maternal Aunt 75  . Kidney failure Paternal Aunt   . Pulmonary fibrosis Paternal Aunt   . Stroke Paternal Aunt   . Heart Problems Paternal Aunt   . Emphysema Paternal Uncle   . Heart Problems Paternal Uncle   . Lung cancer Paternal Uncle        smoker  . Colon cancer Cousin   . Breast cancer Cousin        dx. 26s    Social History:  reports that she quit smoking about 35 years ago. Her smoking use included cigarettes. She has a 9.00 pack-year smoking history. She has never used smokeless tobacco. She reports current alcohol use of about 7.0 standard drinks of alcohol per week. She reports that she does not  use drugs.   Medications Prior to Admission  Medication Sig Dispense Refill  . Calcium Carbonate-Vitamin D (CALTRATE 600+D PO) Take 2 tablets by mouth every evening.    Marland Kitchen ipratropium (ATROVENT) 0.03 % nasal spray Place 1 spray into both nostrils daily as needed (seasonal allergies.).     Marland Kitchen levothyroxine (SYNTHROID, LEVOTHROID) 137 MCG tablet Take 137 mcg by mouth daily before breakfast.    . naproxen sodium (ALEVE) 220 MG tablet Take 220 mg by mouth 2 (two) times daily.    Marland Kitchen sulfamethoxazole-trimethoprim (BACTRIM DS,SEPTRA DS) 800-160 MG tablet Take 1 tablet by mouth 2 (two) times daily. Completed 07-09-2018       Physical Exam: left shoulder with painful and restricted motion as noted at recent office visits  Vitals  Temp:  [97.7 F (36.5 C)] 97.7 F (36.5 C) (01/30 0750) Pulse Rate:  [88] 88 (01/30 0750) Resp:  [14] 14 (01/30 0750) BP: (119)/(59) 119/59 (01/30 0750) SpO2:  [94 %] 94 % (01/30 0750) Weight:  [117.5 kg] 117.5 kg (01/30 0801)  Assessment/Plan  Impression: left shoulder osteoarthritis  Plan of Action: Procedure(s): REVERSE SHOULDER ARTHROPLASTY  Elizabeth Ramirez 07/14/2018, 9:17 AM Contact # [phone]

## 2018-07-14 NOTE — Progress Notes (Signed)
Pt c/o" nausea and hot".  HR  In low 30's  Dr. Lissa Hoard  aware and at bedside.

## 2018-07-14 NOTE — Anesthesia Procedure Notes (Signed)
Procedure Name: Intubation Date/Time: 07/14/2018 10:17 AM Performed by: Claudia Desanctis, CRNA Pre-anesthesia Checklist: Patient identified, Emergency Drugs available, Suction available and Patient being monitored Patient Re-evaluated:Patient Re-evaluated prior to induction Oxygen Delivery Method: Circle system utilized Preoxygenation: Pre-oxygenation with 100% oxygen Induction Type: IV induction Ventilation: Mask ventilation without difficulty Laryngoscope Size: 2 and Miller Grade View: Grade I Tube type: Oral Number of attempts: 1 Airway Equipment and Method: Stylet Placement Confirmation: ETT inserted through vocal cords under direct vision,  positive ETCO2 and breath sounds checked- equal and bilateral Secured at: 21 cm Tube secured with: Tape Dental Injury: Teeth and Oropharynx as per pre-operative assessment

## 2018-07-14 NOTE — Transfer of Care (Signed)
Immediate Anesthesia Transfer of Care Note  Patient: Elizabeth Ramirez  Procedure(s) Performed: REVERSE SHOULDER ARTHROPLASTY (Left Shoulder)  Patient Location: PACU  Anesthesia Type:GA combined with regional for post-op pain and   Level of Consciousness: awake and patient cooperative  Airway & Oxygen Therapy: Patient Spontanous Breathing and Patient connected to face mask  Post-op Assessment: Report given to RN and Post -op Vital signs reviewed and stable  Post vital signs: Reviewed and stable  Last Vitals:  Vitals Value Taken Time  BP 138/71 07/14/2018 12:05 PM  Temp    Pulse 78 07/14/2018 12:05 PM  Resp 14 07/14/2018 12:05 PM  SpO2 96 % 07/14/2018 12:05 PM  Vitals shown include unvalidated device data.  Last Pain:  Vitals:   07/14/18 0801  TempSrc:   PainSc: 7       Patients Stated Pain Goal: 5 (72/62/03 5597)  Complications: No apparent anesthesia complications

## 2018-07-14 NOTE — Anesthesia Procedure Notes (Signed)
Anesthesia Regional Block: Interscalene brachial plexus block   Pre-Anesthetic Checklist: ,, timeout performed, Correct Patient, Correct Site, Correct Laterality, Correct Procedure, Correct Position, site marked, Risks and benefits discussed,  Surgical consent,  Pre-op evaluation,  At surgeon's request and post-op pain management  Laterality: Left  Prep: chloraprep       Needles:  Injection technique: Single-shot  Needle Type: Stimulator Needle - 40     Needle Length: 4cm  Needle Gauge: 22     Additional Needles:   Procedures:,,,, ultrasound used (permanent image in chart),,,,  Narrative:  Start time: 07/14/2018 9:15 AM End time: 07/14/2018 9:18 AM Injection made incrementally with aspirations every 5 mL. Anesthesiologist: Nolon Nations, MD  Additional Notes: BP cuff, EKG monitors applied. Sedation begun. Nerve location verified with U/S. Anesthetic injected incrementally, slowly , and after neg aspirations under direct u/s guidance. Good perineural spread. Tolerated well.

## 2018-07-14 NOTE — Progress Notes (Signed)
Assisted Dr. Germeroth with left, ultrasound guided, interscalene  block. Side rails up, monitors on throughout procedure. See vital signs in flow sheet. Tolerated Procedure well. 

## 2018-07-14 NOTE — Progress Notes (Signed)
Pt states "feeling better". HR 70'S. BP 106/65.

## 2018-07-14 NOTE — Op Note (Signed)
07/14/2018  11:42 AM  PATIENT:   Elizabeth Ramirez  73 y.o. female  PRE-OPERATIVE DIAGNOSIS:  left shoulder osteoarthritis  POST-OPERATIVE DIAGNOSIS: Same  PROCEDURE: Left shoulder reverse arthroplasty utilizing a size 5.5 stem with posterior offset, +6 polyethylene insert, 36/+4 glenosphere on a small/+2 baseplate  SURGEON:  Fergus Throne, Metta Clines M.D.  ASSISTANTS: Jenetta Loges, PA-C  ANESTHESIA:   General endotracheal and interscalene block with Exparel  EBL: 150 cc  SPECIMEN: None  Drains: None   PATIENT DISPOSITION:  PACU - hemodynamically stable.    PLAN OF CARE: Admit for overnight observation  Brief history:  Patient is a 73 year old female who said chronic and progressively increasing left shoulder pain related to end-stage osteoarthritis.  She has had profound restrictions in mobility over the years with significant compromise of the rotator cuff function.  Due to her ongoing pain and functional limitations she is brought to the operating this time for planned left shoulder reverse arthroplasty.  Preoperatively and counseled [name] regarding treatment options as well as the potential risks versus benefits thereof.  Possible surgical complications were reviewed including bleeding, infection, neurovascular injury, persistent pain, loss of motion, anesthetic complication, failure of the implant, and possible need for additional surgery.  She understands and accepts and agrees with her planned procedure.  Procedure in detail:  After undergoing routine preop evaluation patient received prophylactic antibiotics and interscalene block was established with Exparel in the holding area by the anesthesia department.  Placed supine on the operating table underwent smooth duction of an LMA general esthesia.  Placed into the beachchair position and appropriately padded and protected.  The left shoulder girdle region was sterilely prepped and draped in standard fashion.  Timeout was  called.  An anterior deltopectoral approach left shoulder was made through 10 cm incision.  Skin flaps were elevated dissection carried deeply and electrocautery was used for hemostasis.  The deltopectoral interval was then developed from proximal to distal with a vein taken laterally.  Of a centimeter the pectoralis major was tenotomized to enhance exposure and the conjoined tendon was mobilized and retracted medially.  Long head biceps tendon was then tenodesed at the upper border of the pectoralis major and then excised proximally.  Subscapularis was divided from its insertion into the lesser tuberosity and tagged with suture tape sutures.  Capsular attachments from the anterior and infra margins of the humeral neck were then divided in a subperiosteal fashion and the humeral head was delivered through the wound.  Our humeral head resection was then outlined with the extra medullary guide and this was then completed with an oscillating saw.  Metal cap placed over the cut proximal humeral surface we then exposed the glenoid with appropriate retractors.  I performed a circumferential labral resection gaining complete visualization the periphery of the glenoid.  A guidepin was then placed into the center of the glenoid with an approximate 10 degree inferior tilt and the glenoid was then prepared with the central followed by the peripheral reamers and residual debris was carefully removed.  Our central  hole was then drilled and tapped and our baseplate was assembled with a 25 mm central lag screw.  This was inserted with excellent fit and fixation.  All 4 peripheral locking screws were then drilled and measured and inserted with excellent fit fixation.  Our 36/+4 glenosphere was then impacted onto the baseplate and then the locking screw was placed with good fixation.  At this point we returned our attention to the proximal humerus  where the canal was hand reamed up to size 6 broached up to size 5.5 and then we used  a posterior offset metaphyseal reamer.  A trial implant was then placed and this showed good soft tissue balance.  Our final implant was then assembled on the back table and our stem was then impacted with excellent interference fit and fixation.  We then performed a series of trial reductions and felt that the +6 polyethylene insert gave [place] excellent soft tissue balance.  The final +6 poly-was then impacted onto our humeral stem and the final reduction was then performed showing excellent soft tissue balance and good shoulder motion good stability.  The subscapularis was then repaired back to the lesser tuberosity using the eyelets on the collar of our humeral stem.  The wound was irrigated and hemostasis was obtained.  The deltopectoral interval was then closed with a series of figure-of-eight #1 Vicryl sutures.  2-0 Monocryl used with subcu layer and intracuticular 3 Monocryl for the skin.  Dermabond and Aquasol dressing was then applied.  Left arm was placed into a sling and the patient was awakened, extubated, taken recovery in stable condition.  Jenetta Loges, PA-C was used as an Environmental consultant throughout this case essential for help with positioning of the patient, positioning of the extremity, tissue manipulation, implantation of the prosthesis, wound closure, and intraoperative decision-making.  Metta Clines Adlynn Lowenstein MD   Contact # [phone]

## 2018-07-14 NOTE — Anesthesia Postprocedure Evaluation (Signed)
Anesthesia Post Note  Patient: Elizabeth Ramirez  Procedure(s) Performed: REVERSE SHOULDER ARTHROPLASTY (Left Shoulder)     Patient location during evaluation: PACU Anesthesia Type: General Level of consciousness: sedated and patient cooperative Pain management: pain level controlled Vital Signs Assessment: post-procedure vital signs reviewed and stable Respiratory status: spontaneous breathing Cardiovascular status: stable Anesthetic complications: no Comments: Pt complaining of pain in subscapular area. Ice pack placed. Explained that the block will not cover this area.    Last Vitals:  Vitals:   07/14/18 1427 07/14/18 1445  BP: (!) 148/77 (!) 148/81  Pulse: 72 80  Resp: 16 15  Temp: (!) 36.2 C 36.5 C  SpO2: 97% 97%    Last Pain:  Vitals:   07/14/18 1445  TempSrc: Oral  PainSc:                  Nolon Nations

## 2018-07-15 MED ORDER — OXYCODONE-ACETAMINOPHEN 5-325 MG PO TABS: 1.0000 | ORAL_TABLET | ORAL | 0 refills | Status: DC | PRN

## 2018-07-15 MED ORDER — NAPROXEN 500 MG PO TABS: 500.0000 mg | ORAL_TABLET | Freq: Two times a day (BID) | ORAL | 1 refills | Status: DC

## 2018-07-15 MED ORDER — METHOCARBAMOL 500 MG PO TABS: 500.0000 mg | ORAL_TABLET | Freq: Three times a day (TID) | ORAL | 1 refills | Status: DC | PRN

## 2018-07-15 MED ORDER — ONDANSETRON HCL 4 MG PO TABS: 4.0000 mg | ORAL_TABLET | Freq: Three times a day (TID) | ORAL | 0 refills | Status: DC | PRN

## 2018-07-15 NOTE — Plan of Care (Signed)
Pt alert and oriented, pain well controlled this am.  Doing well. Plan to d/c home per MD order today.

## 2018-07-15 NOTE — Evaluation (Signed)
Occupational Therapy Evaluation and Discharge Patient Details Name: Elizabeth Ramirez MRN: [id number] DOB: 08-19-1945 Today's Date: 07/15/2018    History of Present Illness s/p left total reverse shoulder   Clinical Impression   This 73 yo female admitted and underwent above presents to acute OT with all education completed with pt and significant other ([name]). We will D/C from acute OT.    Follow Up Recommendations  Supervision/Assistance - 24 hour(per surgeon)    Equipment Recommendations  None recommended by OT       Precautions / Restrictions Precautions Precautions: Shoulder Type of Shoulder Precautions: For ADLs pt can do PROM/AAROM/AROM shoulder external rotation up to 20 degrees, shoulder abduction to 45 degrees, shoulder forward flexion to 60 degrees, pendulums OK, lap slides OK, elbow-wrist-hand OK Shoulder Interventions: Shoulder sling/immobilizer;Off for dressing/bathing/exercises Precaution Booklet Issued: Yes (comment) Precaution Comments: Can have sling off in controlled environment at rest. Needs to wear at night.  Restrictions Weight Bearing Restrictions: Yes LUE Weight Bearing: Non weight bearing      Mobility Bed Mobility               General bed mobility comments: Pt OOB in recliner upon my arrival  Transfers Overall transfer level: Needs assistance   Transfers: Sit to/from Stand Sit to Stand: Supervision                  ADL either performed or assessed with clinical judgement   ADL                                         General ADL Comments: Pt able to show me with RUE the limitations she needs to follow with her LUE from an ADL standpoint     Vision Patient Visual Report: No change from baseline              Pertinent Vitals/Pain Pain Assessment: 0-10 Pain Score: 3  Pain Location: shoulder Pain Descriptors / Indicators: Sore Pain Intervention(s): Limited activity within patient's tolerance;Monitored  during session;Repositioned     Hand Dominance Right   Extremity/Trunk Assessment Upper Extremity Assessment Upper Extremity Assessment: LUE deficits/detail LUE Deficits / Details: shoulder sx this admission; elbow and distally WFL LUE Coordination: decreased gross motor           Communication Communication Communication: No difficulties   Cognition Arousal/Alertness: Awake/alert Behavior During Therapy: WFL for tasks assessed/performed Overall Cognitive Status: Within Functional Limits for tasks assessed                                        Exercises Other Exercises Other Exercises: pt completed 10 reps of elbow, wrist, forearm, and hand exercises. She also completed 10 reps of lap slides and pendulums (x4)   Shoulder Instructions Shoulder Instructions Donning/doffing shirt without moving shoulder: Patient able to independently direct caregiver Method for sponge bathing under operated UE: Patient able to independently direct caregiver Donning/doffing sling/immobilizer: Caregiver independent with task Correct positioning of sling/immobilizer: Caregiver independent with task Pendulum exercises (written home exercise program): Supervision/safety ROM for elbow, wrist and digits of operated UE: Supervision/safety Sling wearing schedule (on at all times/off for ADL's): Independent Proper positioning of operated UE when showering: Independent Dressing change: (NA) Positioning of UE while sleeping: Patient able to independently direct caregiver    Home  Living Family/patient expects to be discharged to:: Private residence Living Arrangements: Spouse/significant other Available Help at Discharge: Family;Available 24 hours/day               Bathroom Shower/Tub: Walk-in shower;Door   [employer]: Standard                Prior Functioning/Environment Level of Independence: Independent                 OT Problem List: Decreased range of  motion;Decreased strength;Pain;Impaired UE functional use;Obesity         OT Goals(Current goals can be found in the care plan section) Acute Rehab OT Goals Patient Stated Goal: home today  OT Frequency:                AM-PAC OT "6 Clicks" Daily Activity     Outcome Measure Help from another person eating meals?: None Help from another person taking care of personal grooming?: A Little Help from another person toileting, which includes using toliet, bedpan, or urinal?: A Little Help from another person bathing (including washing, rinsing, drying)?: A Lot Help from another person to put on and taking off regular upper body clothing?: A Lot Help from another person to put on and taking off regular lower body clothing?: A Lot 6 Click Score: 16   End of Session Equipment Utilized During Treatment: (sling) Nurse Communication: (Pt to call RN when she is ready to go--feeling a little dizzy)  Activity Tolerance: Patient tolerated treatment well Patient left: in chair;with call bell/phone within reach;with family/visitor present  OT Visit Diagnosis: Pain Pain - Right/Left: Left Pain - part of body: Shoulder                Time: 1000-1052 OT Time Calculation (min): 52 min Charges:  OT General Charges $OT Visit: 1 Visit OT Evaluation $OT Eval Moderate Complexity: 1 Mod OT Treatments $Self Care/Home Management : 8-22 mins $Therapeutic Exercise: 8-22 mins  Golden Circle, OTR/L Acute [employer] Pager [phone] Office [phone]     Almon Register 07/15/2018, 11:15 AM

## 2018-07-15 NOTE — Discharge Summary (Signed)
PATIENT ID:      Elizabeth Ramirez  MRN:     [id number] DOB/AGE:    [birth year] / 73 y.o.     DISCHARGE SUMMARY  ADMISSION DATE:    07/14/2018 DISCHARGE DATE:    ADMISSION DIAGNOSIS: left shoulder osteoarthritis Past Medical History:  Diagnosis Date  . Allergy    tetracycline, levofloxacin, crabs & bay leaves  . Arthritis    Rt knee, Rt shoulder  . 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 Cancer ([place])   . Ear infection 10/19/2013  . Genetic testing 01/18/2015   Negative - no mutations found in any of 20 genes on the Breast/Ovarian Cancer Panel.  Additionally, no variants of uncertain significance (VUSs) were found.  The Breast/Ovarian gene panel offered by [facility] Elizabeth Pigeon, MD) includes sequencing and deletion/duplication analysis for the following 19 genes:  ATM, BARD1, BRCA1, BRCA2, BRIP1, CDH1, CHEK2, FANCC, MLH1, MSH2, MSH6, NBN, PALB2, PMS2, PTEN, RAD51C, RAD51D, TP53, and XRCC2.  This panel also includes deletion/duplication analysis (without sequencing) for one gene, EPCAM.  Date of report is January 14, 2015.    Marland Kitchen Heart murmur   . History of kidney stones    x1 '71  . Hypothyroid 07/29/2011  . Hypothyroidism   . Malignant neoplasm of upper-outer quadrant of right breast in female, estrogen receptor positive ([place]) 01/14/2015  . OA (osteoarthritis) of knee 05/27/2015  . Obesity, morbid, BMI 40.0-49.9 ([place]) 12/31/2015  . Other chronic pain   . Personal history of radiation therapy   . PONV (postoperative nausea and vomiting)    extreme nausea and vomiting - cannot tolerate scopalamine   . 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 Seasonal allergies   . Thyroid disease   . Vitamin D deficiency     DISCHARGE DIAGNOSIS:   Active Problems:   S/P reverse total shoulder arthroplasty, left   PROCEDURE: Procedure(s): REVERSE SHOULDER ARTHROPLASTY on 07/14/2018  CONSULTS:     HISTORY:  See H&P in chart.  HOSPITAL COURSE:  Elizabeth Ramirez is a 73 y.o. admitted on 07/14/2018 with a diagnosis of left shoulder osteoarthritis.  They were brought to the operating room on 07/14/2018 and underwent Procedure(s): [place].    They were given perioperative antibiotics:  Anti-infectives (From admission, onward)   Start     Dose/Rate Route Frequency Ordered Stop   07/14/18 0745  ceFAZolin (ANCEF) IVPB 2g/100 mL premix     2 g 200 mL/hr over 30 Minutes Intravenous On call to O.R. 07/14/18 1761 07/14/18 1029    .  Patient underwent the above named procedure and tolerated it well. The following day they were hemodynamically stable and pain was controlled on oral analgesics. They were neurovascularly intact to the operative extremity. OT was ordered and worked with patient per protocol. They were medically and orthopaedically stable for discharge on .    DIAGNOSTIC STUDIES:  RECENT RADIOGRAPHIC STUDIES :  No results found.  RECENT VITAL SIGNS:   Patient Vitals for the past 24 hrs:  BP Temp Temp src Pulse Resp SpO2  07/15/18 0615 (!) 131/57 97.8 F (36.6 C) Oral 74 16 96 %  07/15/18 0223 (!) 143/74 97.6 F (36.4 C) Oral 74 17 97 %  07/14/18 2119 (!) 143/65 97.8 F (36.6 C) - 77 16 96 %  07/14/18 1653 (!) 143/79 98 F (36.7 C) Oral 75 17 99 %  07/14/18 1535 115/67 - -  73 17 93 %  07/14/18 1445 (!) 148/81 97.7 F (36.5 C) Oral 80 15 97 %  07/14/18 1427 (!) 148/77 (!) 97.2 F (36.2 C) - 72 16 97 %  07/14/18 1415 133/70 - - 68 11 96 %  07/14/18 1400 (!) 146/77 - - 77 12 96 %  07/14/18 1345 (!) 166/97 - - 64 12 97 %  07/14/18 1330 (!) 167/98 - - 81 12 100 %  07/14/18 1315 (!) 164/89 - - 67 13 99 %  07/14/18 1300 (!) 158/89 - - 78 12 100 %  07/14/18 1245 (!) 150/75 - - 69 10 100 %  07/14/18 1230 (!) 133/105 - - 77 15 100 %  07/14/18 1215 (!) 142/81 - - 72 12 100 %  07/14/18 1210 - - - 74 15 98 %  07/14/18 1205 138/71 97.6 F (36.4 C) - 78  14 96 %  07/14/18 1003 - - - 69 13 94 %  07/14/18 1002 - - - 69 19 93 %  07/14/18 1001 - - - 66 15 91 %  07/14/18 1000 106/65 - - 65 12 92 %  07/14/18 0959 - - - 64 13 93 %  07/14/18 0958 100/62 - - 62 13 93 %  07/14/18 0957 (!) 80/52 - - (!) 53 10 94 %  07/14/18 0955 - - - (!) 50 14 96 %  07/14/18 0954 - - - (!) 28 16 94 %  07/14/18 0953 - - - (!) 36 15 96 %  07/14/18 0952 - - - (!) 40 15 95 %  07/14/18 0951 - - - (!) 43 18 96 %  07/14/18 0949 - - - (!) 40 (!) 29 95 %  07/14/18 0948 - - - 61 19 95 %  07/14/18 0947 - - - 69 18 94 %  07/14/18 0946 - - - 73 (!) 25 94 %  07/14/18 0945 - - - 70 16 95 %  07/14/18 0944 - - - 66 10 95 %  07/14/18 0943 - - - 70 13 95 %  07/14/18 0942 - - - 73 13 96 %  07/14/18 0941 - - - 70 (!) 9 95 %  07/14/18 0926 - - - 72 (!) 9 95 %  07/14/18 0925 119/69 - - 76 10 93 %  07/14/18 0924 - - - 73 20 96 %  07/14/18 0923 - - - 72 14 96 %  07/14/18 0922 - - - 73 (!) 9 97 %  07/14/18 0921 - - - 74 19 95 %  07/14/18 0920 113/66 - - 71 11 98 %  07/14/18 0919 - - - 72 11 98 %  07/14/18 0918 - - - 72 (!) 6 99 %  07/14/18 0917 - - - 75 (!) 8 99 %  07/14/18 0916 - - - 74 12 99 %  07/14/18 0915 139/69 - - 77 (!) 7 98 %  .  RECENT EKG RESULTS:    Orders placed or performed during the hospital encounter of 07/12/18  . EKG 12 lead  . EKG 12 lead    DISCHARGE INSTRUCTIONS:    DISCHARGE MEDICATIONS:   Allergies as of 07/15/2018      Reactions   Levofloxacin Other (See Comments)   Affected tendons, severe pain and swelling   Tetracyclines & Related Swelling   tongue swelling and peeling   Other Diarrhea, Rash   Crabs - diarrhea. Bay leaves - rash in her mouth   Ciprofloxacin  Tendon problems    Flonase [fluticasone]    Nasal spray cause nose bleeds   Ibuprofen    Gets mouth ulcers if she takes for more than 3 days; Aleve okay    Nitrofurantoin Monohyd Macro Hives   Scopolamine    "Headache and redness of eyes"      Medication List    STOP  taking these medications   CALTRATE 600+D PO   naproxen sodium 220 MG tablet Commonly known as:  ALEVE     TAKE these medications   ipratropium 0.03 % nasal spray Commonly known as:  ATROVENT Place 1 spray into both nostrils daily as needed (seasonal allergies.).   levothyroxine 137 MCG tablet Commonly known as:  SYNTHROID, LEVOTHROID Take 137 mcg by mouth daily before breakfast.   methocarbamol 500 MG tablet Commonly known as:  ROBAXIN Take 1 tablet (500 mg total) by mouth every 8 (eight) hours as needed for muscle spasms.   naproxen 500 MG tablet Commonly known as:  NAPROSYN Take 1 tablet (500 mg total) by mouth 2 (two) times daily with a meal.   ondansetron 4 MG tablet Commonly known as:  ZOFRAN Take 1 tablet (4 mg total) by mouth every 8 (eight) hours as needed for nausea or vomiting.   oxyCODONE-acetaminophen 5-325 MG tablet Commonly known as:  PERCOCET Take 1 tablet by mouth every 4 (four) hours as needed (max 6 q).   sulfamethoxazole-trimethoprim 800-160 MG tablet Commonly known as:  BACTRIM DS,SEPTRA DS Take 1 tablet by mouth 2 (two) times daily. Completed 07-09-2018       FOLLOW UP VISIT:   Follow-up Information    Justice Britain, MD.   Specialty:  Orthopedic Surgery Why:  call to be seen in 10-14 days Contact information: [address] [place] [address] [phone]           DISCHARGE TO: Home   DISCHARGE CONDITION:  Thereasa Parkin Fabrizio Filip for Dr. Lennette Bihari Supple 07/15/2018, 8:16 AM

## 2018-07-15 NOTE — Progress Notes (Signed)
Discharge paperwork discussed with pt and partner at the bedside. They demonstrated understanding and pain well controlled. Pt was escorted to main lobby by wheelchair in stable condition.

## 2018-07-15 NOTE — Progress Notes (Signed)
Pain level controlled by PRN's and scheduled medications. Vitals stable.

## 2018-07-21 ENCOUNTER — Encounter (HOSPITAL_COMMUNITY): Payer: Self-pay | Admitting: Orthopedic Surgery

## 2018-07-26 DIAGNOSIS — Z471 Aftercare following joint replacement surgery: Secondary | ICD-10-CM | POA: Diagnosis not present

## 2018-07-26 DIAGNOSIS — Z4789 Encounter for other orthopedic aftercare: Secondary | ICD-10-CM | POA: Diagnosis not present

## 2018-07-26 DIAGNOSIS — Z96612 Presence of left artificial shoulder joint: Secondary | ICD-10-CM | POA: Diagnosis not present

## 2018-08-11 DIAGNOSIS — M25512 Pain in left shoulder: Secondary | ICD-10-CM | POA: Diagnosis not present

## 2018-08-15 DIAGNOSIS — M25512 Pain in left shoulder: Secondary | ICD-10-CM | POA: Diagnosis not present

## 2018-08-17 DIAGNOSIS — M25512 Pain in left shoulder: Secondary | ICD-10-CM | POA: Diagnosis not present

## 2018-08-23 DIAGNOSIS — M25512 Pain in left shoulder: Secondary | ICD-10-CM | POA: Diagnosis not present

## 2018-08-25 DIAGNOSIS — M25512 Pain in left shoulder: Secondary | ICD-10-CM | POA: Diagnosis not present

## 2018-08-29 DIAGNOSIS — M25512 Pain in left shoulder: Secondary | ICD-10-CM | POA: Diagnosis not present

## 2018-09-02 DIAGNOSIS — M25512 Pain in left shoulder: Secondary | ICD-10-CM | POA: Diagnosis not present

## 2018-09-07 DIAGNOSIS — M25512 Pain in left shoulder: Secondary | ICD-10-CM | POA: Diagnosis not present

## 2018-09-08 DIAGNOSIS — M25512 Pain in left shoulder: Secondary | ICD-10-CM | POA: Diagnosis not present

## 2018-09-12 DIAGNOSIS — M25512 Pain in left shoulder: Secondary | ICD-10-CM | POA: Diagnosis not present

## 2018-09-15 DIAGNOSIS — M25512 Pain in left shoulder: Secondary | ICD-10-CM | POA: Diagnosis not present

## 2018-09-19 DIAGNOSIS — M25512 Pain in left shoulder: Secondary | ICD-10-CM | POA: Diagnosis not present

## 2018-09-22 DIAGNOSIS — M25512 Pain in left shoulder: Secondary | ICD-10-CM | POA: Diagnosis not present

## 2018-09-26 DIAGNOSIS — M25512 Pain in left shoulder: Secondary | ICD-10-CM | POA: Diagnosis not present

## 2018-09-29 DIAGNOSIS — M25512 Pain in left shoulder: Secondary | ICD-10-CM | POA: Diagnosis not present

## 2018-10-03 DIAGNOSIS — M25512 Pain in left shoulder: Secondary | ICD-10-CM | POA: Diagnosis not present

## 2018-10-06 DIAGNOSIS — M25512 Pain in left shoulder: Secondary | ICD-10-CM | POA: Diagnosis not present

## 2018-10-07 DIAGNOSIS — Z471 Aftercare following joint replacement surgery: Secondary | ICD-10-CM | POA: Diagnosis not present

## 2018-10-07 DIAGNOSIS — Z4789 Encounter for other orthopedic aftercare: Secondary | ICD-10-CM | POA: Diagnosis not present

## 2018-10-07 DIAGNOSIS — Z96612 Presence of left artificial shoulder joint: Secondary | ICD-10-CM | POA: Diagnosis not present

## 2018-10-10 DIAGNOSIS — M25512 Pain in left shoulder: Secondary | ICD-10-CM | POA: Diagnosis not present

## 2018-10-13 DIAGNOSIS — M25512 Pain in left shoulder: Secondary | ICD-10-CM | POA: Diagnosis not present

## 2018-10-18 DIAGNOSIS — M25512 Pain in left shoulder: Secondary | ICD-10-CM | POA: Diagnosis not present

## 2018-10-20 DIAGNOSIS — M25512 Pain in left shoulder: Secondary | ICD-10-CM | POA: Diagnosis not present

## 2018-10-25 DIAGNOSIS — M25512 Pain in left shoulder: Secondary | ICD-10-CM | POA: Diagnosis not present

## 2018-10-31 DIAGNOSIS — M25512 Pain in left shoulder: Secondary | ICD-10-CM | POA: Diagnosis not present

## 2018-11-03 DIAGNOSIS — M25512 Pain in left shoulder: Secondary | ICD-10-CM | POA: Diagnosis not present

## 2018-11-08 ENCOUNTER — Other Ambulatory Visit: Payer: Self-pay | Admitting: Physician Assistant

## 2018-11-08 DIAGNOSIS — D0439 Carcinoma in situ of skin of other parts of face: Secondary | ICD-10-CM | POA: Diagnosis not present

## 2018-11-08 DIAGNOSIS — C4492 Squamous cell carcinoma of skin, unspecified: Secondary | ICD-10-CM

## 2018-11-08 DIAGNOSIS — L570 Actinic keratosis: Secondary | ICD-10-CM | POA: Diagnosis not present

## 2018-11-08 HISTORY — DX: Squamous cell carcinoma of skin, unspecified: C44.92

## 2018-11-21 ENCOUNTER — Other Ambulatory Visit: Payer: Self-pay | Admitting: Oncology

## 2018-11-21 DIAGNOSIS — Z9889 Other specified postprocedural states: Secondary | ICD-10-CM

## 2018-11-21 DIAGNOSIS — Z853 Personal history of malignant neoplasm of breast: Secondary | ICD-10-CM

## 2018-11-29 ENCOUNTER — Other Ambulatory Visit (HOSPITAL_BASED_OUTPATIENT_CLINIC_OR_DEPARTMENT_OTHER): Payer: Self-pay | Admitting: Family Medicine

## 2018-11-29 DIAGNOSIS — E782 Mixed hyperlipidemia: Secondary | ICD-10-CM | POA: Diagnosis not present

## 2018-11-29 DIAGNOSIS — E039 Hypothyroidism, unspecified: Secondary | ICD-10-CM | POA: Diagnosis not present

## 2018-11-29 DIAGNOSIS — E559 Vitamin D deficiency, unspecified: Secondary | ICD-10-CM | POA: Diagnosis not present

## 2018-11-29 DIAGNOSIS — M7989 Other specified soft tissue disorders: Secondary | ICD-10-CM

## 2018-11-30 ENCOUNTER — Other Ambulatory Visit: Payer: Self-pay

## 2018-11-30 ENCOUNTER — Ambulatory Visit (HOSPITAL_BASED_OUTPATIENT_CLINIC_OR_DEPARTMENT_OTHER)
Admission: RE | Admit: 2018-11-30 | Discharge: 2018-11-30 | Disposition: A | Discharge status: Home / Self Care | Payer: Medicare Other | Source: Ambulatory Visit | Attending: Family Medicine | Admitting: Family Medicine

## 2018-11-30 DIAGNOSIS — M7989 Other specified soft tissue disorders: Secondary | ICD-10-CM | POA: Diagnosis not present

## 2018-12-07 DIAGNOSIS — H35363 Drusen (degenerative) of macula, bilateral: Secondary | ICD-10-CM | POA: Diagnosis not present

## 2018-12-07 DIAGNOSIS — H43813 Vitreous degeneration, bilateral: Secondary | ICD-10-CM | POA: Diagnosis not present

## 2018-12-07 DIAGNOSIS — H33103 Unspecified retinoschisis, bilateral: Secondary | ICD-10-CM | POA: Diagnosis not present

## 2018-12-07 DIAGNOSIS — H35033 Hypertensive retinopathy, bilateral: Secondary | ICD-10-CM | POA: Diagnosis not present

## 2018-12-15 DIAGNOSIS — L570 Actinic keratosis: Secondary | ICD-10-CM | POA: Diagnosis not present

## 2018-12-15 DIAGNOSIS — D0439 Carcinoma in situ of skin of other parts of face: Secondary | ICD-10-CM | POA: Diagnosis not present

## 2018-12-27 ENCOUNTER — Encounter: Payer: Self-pay | Admitting: *Deleted

## 2019-01-04 DIAGNOSIS — Z471 Aftercare following joint replacement surgery: Secondary | ICD-10-CM | POA: Diagnosis not present

## 2019-01-04 DIAGNOSIS — Z96612 Presence of left artificial shoulder joint: Secondary | ICD-10-CM | POA: Diagnosis not present

## 2019-01-12 ENCOUNTER — Encounter: Payer: Self-pay | Admitting: Oncology

## 2019-01-16 ENCOUNTER — Ambulatory Visit
Admission: RE | Admit: 2019-01-16 | Discharge: 2019-01-16 | Disposition: A | Discharge status: Home / Self Care | Payer: Medicare Other | Source: Ambulatory Visit | Attending: Oncology | Admitting: Oncology

## 2019-01-16 ENCOUNTER — Other Ambulatory Visit: Payer: Self-pay

## 2019-01-16 DIAGNOSIS — R928 Other abnormal and inconclusive findings on diagnostic imaging of breast: Secondary | ICD-10-CM | POA: Diagnosis not present

## 2019-01-16 DIAGNOSIS — Z9889 Other specified postprocedural states: Secondary | ICD-10-CM

## 2019-01-16 DIAGNOSIS — Z853 Personal history of malignant neoplasm of breast: Secondary | ICD-10-CM

## 2019-01-20 ENCOUNTER — Encounter: Payer: Self-pay | Admitting: Oncology

## 2019-01-23 ENCOUNTER — Other Ambulatory Visit: Payer: Self-pay

## 2019-01-23 DIAGNOSIS — Z170 Estrogen receptor positive status [ER+]: Secondary | ICD-10-CM

## 2019-01-23 DIAGNOSIS — C50411 Malignant neoplasm of upper-outer quadrant of right female breast: Secondary | ICD-10-CM

## 2019-01-23 NOTE — Progress Notes (Signed)
[place] [facility]  [phone] [phone]) [phone]     ID: HARLEM THRESHER DOB: 12-19-1945  MR#: [id number]  CLE#:[id number]  Patient Care Team: Cari Caraway, MD as PCP - General (Family Medicine) Magrinat, Virgie Dad, MD as Consulting Physician (Oncology) Excell Seltzer, MD as Consulting Physician (General Surgery) Bobbye Charleston, MD as Consulting Physician (Obstetrics and Gynecology) Justice Britain, MD as Consulting Physician (Orthopedic Surgery) OTHER MD:   CHIEF COMPLAINT:  Ductal carcinoma in situ  CURRENT TREATMENT: observation   INTERVAL HISTORY: Alaija returns today for follow-up of her estrogen receptor positive breast cancer. She continues on observation.  Since her last visit, she underwent bone density screening on 05/02/2018. This showed a T-score of 0.4, which is considered normal.  She also underwent bilateral diagnostic mammography with tomography at [facility] on 01/16/2019 showing: breast density category B; no evidence of malignancy in either breast.  She presented with left leg swelling and underwent doppler ultrasound on 11/30/2018. This showed: no evidence for deep vein thrombosis in the left lower extremity; subcutaneous edema noted about the ankle; upper normal lymph nodes identified in the left groin.  REVIEW OF SYSTEMS: Earlean did well with her left shoulder surgery which she had in January.  In the last 2 weeks she has noticed a slight bump in her left neck which she wanted me to check today.  She is not able to exercise regularly because of knee issues.  She is considering getting an exercise bike.  Aside from these issues a detailed review of systems today was stable  BREAST CANCER HISTORY: From the original intake note:  Kade underwent Right lumpectomy and sentinel lymph node biopsy April 2005 for 4 mm, grade 1 tubular tumor (T1a No, stage IA), in the setting of DCIS. This was estrogen and progesterone receptor positive,  HER-2 not amplified. Margins were ample. She participated in the MA-27 study and received anastrozole between June 2005 and June 2010, at which time she was released from follow-up.  More recently, Lorynn had screening bilateral mammography showing a suspicious area in the right breast and on 12/14/2014 underwent right diagnostic mammography at the [facility] Center. The breast density was category A. There was a group of pleomorphic calcifications in the upper central right breast spanning up to 2.8 cm. Biopsy of this area was obtained 12/21/2014 ([id number]) and showed ductal carcinoma in situ, grade 2 or 3, estrogen receptor 95% positive, and progesterone receptor 60% positive, both with strong staining intensity.  The patient's case was presented at the multidisciplinary breast cancer conference 01/02/2015. At that time a preliminary plan was proposed, namely genetics counseling and if no mutation noted breast MRI to be followed by lumpectomy, radiation, and anti-estrogens.  Her subsequent history is as detailed below.   PAST MEDICAL HISTORY: Past Medical History:  Diagnosis Date  . Allergy    tetracycline, levofloxacin, crabs & bay leaves  . Arthritis    Rt knee, Rt shoulder  . Breast cancer ([place]) 2016; 2005   Separate primaries; 2016 - DCIS of R breast(surgery and radiation-last 04-04-15); ER/PR+ April 2005  . Cancer ([place])   . Ear infection 10/19/2013  . Genetic testing 01/18/2015   Negative - no mutations found in any of 20 genes on the Breast/Ovarian Cancer Panel.  Additionally, no variants of uncertain significance (VUSs) were found.  The Breast/Ovarian gene panel offered by [facility] [name], MD) includes sequencing and deletion/duplication analysis for the following 19 genes:  ATM, BARD1, BRCA1, BRCA2, BRIP1, CDH1, CHEK2,  FANCC, MLH1, MSH2, MSH6, NBN, PALB2, PMS2, PTEN, RAD51C, RAD51D, TP53, and XRCC2.  This panel also includes deletion/duplication analysis (without  sequencing) for one gene, EPCAM.  Date of report is January 14, 2015.    Marland Kitchen Heart murmur   . History of kidney stones    x1 '71  . Hypothyroid 07/29/2011  . Hypothyroidism   . Malignant neoplasm of upper-outer quadrant of right breast in female, estrogen receptor positive ([place]) 01/14/2015  . OA (osteoarthritis) of knee 05/27/2015  . Obesity, morbid, BMI 40.0-49.9 ([place]) 12/31/2015  . Other chronic pain   . Personal history of radiation therapy   . PONV (postoperative nausea and vomiting)    extreme nausea and vomiting - cannot tolerate scopalamine   . 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 Seasonal allergies   . Thyroid disease   . Vitamin D deficiency     PAST SURGICAL HISTORY: Past Surgical History:  Procedure Laterality Date  . APPENDECTOMY  08/23/2006  . BREAST LUMPECTOMY Right 09/2003   DCIS  . BREAST LUMPECTOMY Right 2016  . BREAST LUMPECTOMY WITH NEEDLE LOCALIZATION Right 02/11/2015   Procedure: WIRE BRACKETED RIGHT BREAST LUMPECTOMY ;  Surgeon: Excell Seltzer, MD;  Location: [facility];  Service: General;  Laterality: Right;  . CATARACT EXTRACTION, BILATERAL  10-05-2017 left ; 11-09-2017 right   Dr Kandy Garrison   . colonoscopy with biopsy  12/28/2003   neg  . REVERSE SHOULDER ARTHROPLASTY Left 07/14/2018   Procedure: REVERSE SHOULDER ARTHROPLASTY;  Surgeon: Justice Britain, MD;  Location: [facility];  Service: Orthopedics;  Laterality: Left;  Interscalene Block  . TONSILLECTOMY     1954  . TOTAL KNEE ARTHROPLASTY Right 05/27/2015   Procedure: RIGHT TOTAL KNEE ARTHROPLASTY;  Surgeon: Gaynelle Arabian, MD;  Location: [facility];  Service: Orthopedics;  Laterality: Right;  . WISDOM TOOTH EXTRACTION Bilateral 1965    FAMILY HISTORY Family History  Problem Relation Age of Onset  . Dementia Mother   . Other Mother 77       "spot on lung"; secondhand smoke exposure  . Pulmonary fibrosis  Father   . Diabetes Brother   . Thyroid disease Brother   . Leukemia Brother 70       chronic lymphocytic   . Stomach cancer Maternal Grandmother        dx. 71s  . Heart attack Maternal Grandfather   . Diabetes Paternal Grandfather   . Stroke Paternal Grandfather   . Colitis Paternal Grandmother   . Parkinson's disease Maternal Aunt   . Diabetes Paternal Aunt   . Other Paternal Aunt        bowel obstruction  . Heart Problems Paternal Uncle   . Ovarian cancer Maternal Aunt        dx. 30s; paternal half sister of her mother  . Multiple myeloma Maternal Aunt   . Multiple myeloma Maternal Aunt 75  . Kidney failure Paternal Aunt   . Pulmonary fibrosis Paternal Aunt   . Stroke Paternal Aunt   . Heart Problems Paternal Aunt   . Emphysema Paternal Uncle   . Heart Problems Paternal Uncle   . Lung cancer Paternal Uncle        smoker  . Colon cancer Cousin   . Breast cancer Cousin        dx. 57s   [facility] father died at the age of 22 from pulmonary fibrosis. Her mother died  at age 56 with severe dementia. The patient had one brother no sisters. Her maternal grandmother died from stomach cancer the age of 25. The patient's mother had a half sister with ovarian cancer.   GYNECOLOGIC HISTORY:  No LMP recorded. Patient is postmenopausal.  menarche age 64, she isGX P0. Menopause 2003. Did not take hormone replacement   SOCIAL HISTORY:  Works as a Cabin crew, Publishing copy in renovations. She retired from [employer] as of 2018 and is now doing some Electronics engineer. At home it's her and Henriette Combs , who also is in real estate, with her own investment business.Katharine Look is a catholic, though not currently practicing    ADVANCED DIRECTIVES:  [name] is Chief Technology Officer of attorney   HEALTH MAINTENANCE: Social History   Tobacco Use  . Smoking status: Former Smoker    Packs/day: 1.00    Years: 9.00    Pack years: 9.00    Types: Cigarettes    Quit date: 06/16/1983    Years since  quitting: 35.6  . Smokeless tobacco: Never Used  Substance Use Topics  . Alcohol use: Yes    Alcohol/week: 7.0 standard drinks    Types: 7 Glasses of wine per week    Comment: one glass w/ dinner  . Drug use: No     Colonoscopy:  20/15, [place] she knee  PAP: up-to-date/[facility]  Bone density: October 2016; normal  Lipid panel:  Allergies  Allergen Reactions  . Levofloxacin Other (See Comments)    Affected tendons, severe pain and swelling  . Tetracyclines & Related Swelling    tongue swelling and peeling  . Other Diarrhea and Rash    Crabs - diarrhea. Bay leaves - rash in her mouth  . Ciprofloxacin     Tendon problems   . Flonase [Fluticasone]     Nasal spray cause nose bleeds  . Ibuprofen     Gets mouth ulcers if she takes for more than 3 days; Aleve okay   . Nitrofurantoin Monohyd Macro Hives  . Scopolamine     "Headache and redness of eyes"    Current Outpatient Medications  Medication Sig Dispense Refill  . ipratropium (ATROVENT) 0.03 % nasal spray Place 1 spray into both nostrils daily as needed (seasonal allergies.).     Marland Kitchen levothyroxine (SYNTHROID, LEVOTHROID) 137 MCG tablet Take 137 mcg by mouth daily before breakfast.    . methocarbamol (ROBAXIN) 500 MG tablet Take 1 tablet (500 mg total) by mouth every 8 (eight) hours as needed for muscle spasms. 30 tablet 1  . naproxen (NAPROSYN) 500 MG tablet Take 1 tablet (500 mg total) by mouth 2 (two) times daily with a meal. 60 tablet 1  . ondansetron (ZOFRAN) 4 MG tablet Take 1 tablet (4 mg total) by mouth every 8 (eight) hours as needed for nausea or vomiting. 10 tablet 0  . oxyCODONE-acetaminophen (PERCOCET) 5-325 MG tablet Take 1 tablet by mouth every 4 (four) hours as needed (max 6 q). 30 tablet 0  . sulfamethoxazole-trimethoprim (BACTRIM DS,SEPTRA DS) 800-160 MG tablet Take 1 tablet by mouth 2 (two) times daily. Completed 07-09-2018     No current facility-administered medications for this visit.     OBJECTIVE:  Morbidly obese white woman who appears stated age  36:   01/24/19 1226  BP: (!) 145/82  Pulse: 86  Resp: 20  Temp: 98.4 F (36.9 C)  SpO2: 97%     Body mass index is 45.85 kg/m.    ECOG FS:1 - Symptomatic but  completely ambulatory  Sclerae unicteric, EOMs intact Wearing a mask There is a 2 cm soft left cervical lymph node, no other adenopathy Lungs no rales or rhonchi Heart regular rate and rhythm Abd soft, nontender, positive bowel sounds MSK no focal spinal tenderness, no upper extremity lymphedema Neuro: nonfocal, well oriented, appropriate affect Breasts: Status post right lumpectomy and radiation with no evidence of recurrence; left breast is benign; both axillae are benign  RESULTS:  CMP     Component Value Date/Time   NA 139 07/12/2018 1119   K 4.5 07/12/2018 1119   CL 104 07/12/2018 1119   CO2 28 07/12/2018 1119   GLUCOSE 99 07/12/2018 1119   BUN 24 (H) 07/12/2018 1119   CREATININE 1.15 (H) 07/12/2018 1119   CALCIUM 9.4 07/12/2018 1119   PROT 6.8 05/20/2015 1152   ALBUMIN 4.1 05/20/2015 1152   AST 21 05/20/2015 1152   ALT 24 05/20/2015 1152   ALKPHOS 82 05/20/2015 1152   BILITOT 1.6 (H) 05/20/2015 1152   GFRNONAA 47 (L) 07/12/2018 1119   GFRAA 55 (L) 07/12/2018 1119    INo results found for: SPEP, UPEP  Lab Results  Component Value Date   WBC 4.0 01/24/2019   NEUTROABS 2.3 01/24/2019   HGB 14.2 01/24/2019   HCT 41.9 01/24/2019   MCV 89.0 01/24/2019   PLT 163 01/24/2019      Chemistry      Component Value Date/Time   NA 139 07/12/2018 1119   K 4.5 07/12/2018 1119   CL 104 07/12/2018 1119   CO2 28 07/12/2018 1119   BUN 24 (H) 07/12/2018 1119   CREATININE 1.15 (H) 07/12/2018 1119      Component Value Date/Time   CALCIUM 9.4 07/12/2018 1119   ALKPHOS 82 05/20/2015 1152   AST 21 05/20/2015 1152   ALT 24 05/20/2015 1152   BILITOT 1.6 (H) 05/20/2015 1152       Lab Results  Component Value Date   LABCA2 22 12/04/2008    No  components found for: [id number]  No results for input(s): INR in the last 168 hours.  Urinalysis    Component Value Date/Time   COLORURINE YELLOW 05/20/2015 1120   APPEARANCEUR CLEAR 05/20/2015 1120   LABSPEC 1.009 05/20/2015 1120   PHURINE 6.5 05/20/2015 1120   GLUCOSEU NEGATIVE 05/20/2015 1120   HGBUR NEGATIVE 05/20/2015 1120   BILIRUBINUR NEGATIVE 05/20/2015 1120   BILIRUBINUR neg 12/19/2014 1427   KETONESUR NEGATIVE 05/20/2015 1120   PROTEINUR NEGATIVE 05/20/2015 1120   UROBILINOGEN 0.2 12/19/2014 1427   NITRITE NEGATIVE 05/20/2015 1120   LEUKOCYTESUR NEGATIVE 05/20/2015 1120    STUDIES: Mm Diag Breast Tomo Bilateral  Result Date: 01/16/2019 CLINICAL DATA:  RIGHT lumpectomy with radiation treatment 2016. EXAM: DIGITAL DIAGNOSTIC BILATERAL MAMMOGRAM WITH CAD AND TOMO COMPARISON:  01/12/2018 and earlier ACR Breast Density Category b: There are scattered areas of fibroglandular density. FINDINGS: Post operative changes are seen in the RIGHTbreast. No suspicious mass, distortion, or microcalcifications are identified to suggest presence of malignancy. Mammographic images were processed with CAD. IMPRESSION: No mammographic evidence for malignancy. RECOMMENDATION: Diagnostic mammogram is suggested in 1 year. (Code:[id number]-B-01Y) I have discussed the findings and recommendations with the patient. Results were also provided in writing at the conclusion of the visit. If applicable, a reminder letter will be sent to the patient regarding the next appointment. BI-RADS CATEGORY  2: Benign. Electronically Signed   By: Nolon Nations M.D.   On: 01/16/2019 10:35    ASSESSMENT: 73 y.o. [employer]  woman  (1) status post right lumpectomy April 2005 for a pT1a pN0, stage IA invasive breast cancer, estrogen and progesterone receptor positive, HER-2 negative, in the setting of DCIS;   (a) did not receive radiation  (b) status post anastrozole for 5 years completed June 2010  (2) status post right breast  upper outer quadrant biopsy 12/21/2014 for ductal carcinoma in situ, grade 2 or 3, estrogen and progesterone receptor positive.  (3) Status post right lumpectomy 02/11/2015 showing ductal carcinoma in situ, intermediate grade, measuring 0.9 cm, with close but negative margins  (4) adjuvant radiation: 03/07/2015-04/04/2015  Right breast/ 42.5 Gy at 2.5 Gy per fraction x 17 fractions.  Right breast boost/ 7.5 Gy at 2.5 Gy per fraction x 3 fractions  (5) genetic testing 01/14/2015 through the [place] Panel offered by [facility] [name], MD) showed no deleterious mutations in ATM, BARD1, BRCA1, BRCA2, BRIP1, CDH1, CHEK2, FANCC, MLH1, MSH2, MSH6, NBN, PALB2, PMS2, PTEN, RAD51C, RAD51D, TP53, and XRCC2. This panel also includes deletion/duplication analysis (without sequencing) for one gene, EPCAM.  (6) resumed anastrozole 07/06/2014, discontinued March 2017 with arthralgias and myalgias developing   (a) started letrozole 10/14/2015--discontinued after a brief course secondary to side effects  (b) Bone density 03/29/2015 was normal, with the lowest T score at 0.4   PLAN: Jo is now 4 years out from definitive surgery for her breast cancer with no evidence of disease recurrence.  This is very favorable.  I think the left cervical lymph node may be related to dental work that she had recently or to the surgery she had in January.  It has only been present for 2 weeks however.  I asked her to call me 02/14/2019 to let me know if the lymph node is still there.  If it is we will do a cervical and chest CT scans and consider biopsy.  She has however no systemic symptoms.  Of course this is on the wrong side for it to be a metastasis from her right sided breast cancer  Assuming all that resolves without incident she will see me again in 1 year for her final "graduation" visit  She knows to call for any other issue that may develop before then.  Magrinat,  Virgie Dad, MD  01/24/19 12:42 PM Medical Oncology and Hematology [facility] [address] [place],  [address] Tel. [phone]    Fax. [phone]    I, Wilburn Mylar, am acting as scribe for Dr. Virgie Dad. Magrinat.  I, Lurline Del MD, have reviewed the above documentation for accuracy and completeness, and I agree with the above.

## 2019-01-24 ENCOUNTER — Inpatient Hospital Stay: Payer: Medicare Other | Attending: Oncology | Admitting: Oncology

## 2019-01-24 ENCOUNTER — Other Ambulatory Visit: Payer: Self-pay

## 2019-01-24 ENCOUNTER — Inpatient Hospital Stay: Payer: Medicare Other

## 2019-01-24 VITALS — BP 145/82 | HR 86 | Temp 98.4°F | Resp 20 | Ht 64.0 in | Wt 267.1 lb

## 2019-01-24 DIAGNOSIS — E119 Type 2 diabetes mellitus without complications: Secondary | ICD-10-CM | POA: Diagnosis not present

## 2019-01-24 DIAGNOSIS — Z87891 Personal history of nicotine dependence: Secondary | ICD-10-CM | POA: Insufficient documentation

## 2019-01-24 DIAGNOSIS — Z803 Family history of malignant neoplasm of breast: Secondary | ICD-10-CM | POA: Diagnosis not present

## 2019-01-24 DIAGNOSIS — Z79899 Other long term (current) drug therapy: Secondary | ICD-10-CM | POA: Diagnosis not present

## 2019-01-24 DIAGNOSIS — E039 Hypothyroidism, unspecified: Secondary | ICD-10-CM | POA: Diagnosis not present

## 2019-01-24 DIAGNOSIS — C50411 Malignant neoplasm of upper-outer quadrant of right female breast: Secondary | ICD-10-CM | POA: Diagnosis not present

## 2019-01-24 DIAGNOSIS — Z791 Long term (current) use of non-steroidal anti-inflammatories (NSAID): Secondary | ICD-10-CM | POA: Insufficient documentation

## 2019-01-24 DIAGNOSIS — Z170 Estrogen receptor positive status [ER+]: Secondary | ICD-10-CM | POA: Diagnosis not present

## 2019-01-24 DIAGNOSIS — Z923 Personal history of irradiation: Secondary | ICD-10-CM | POA: Diagnosis not present

## 2019-01-24 DIAGNOSIS — C50911 Malignant neoplasm of unspecified site of right female breast: Secondary | ICD-10-CM | POA: Diagnosis not present

## 2019-01-24 DIAGNOSIS — E559 Vitamin D deficiency, unspecified: Secondary | ICD-10-CM | POA: Insufficient documentation

## 2019-01-24 LAB — CBC WITH DIFFERENTIAL (CANCER CENTER ONLY)
Abs Immature Granulocytes: 0.01 10*3/uL (ref 0.00–0.07)
Basophils Absolute: 0 10*3/uL (ref 0.0–0.1)
Basophils Relative: 0 %
Eosinophils Absolute: 0.1 10*3/uL (ref 0.0–0.5)
Eosinophils Relative: 3 %
HCT: 41.9 % (ref 36.0–46.0)
Hemoglobin: 14.2 g/dL (ref 12.0–15.0)
Immature Granulocytes: 0 %
Lymphocytes Relative: 32 %
Lymphs Abs: 1.3 10*3/uL (ref 0.7–4.0)
MCH: 30.1 pg (ref 26.0–34.0)
MCHC: 33.9 g/dL (ref 30.0–36.0)
MCV: 89 fL (ref 80.0–100.0)
Monocytes Absolute: 0.3 10*3/uL (ref 0.1–1.0)
Monocytes Relative: 7 %
Neutro Abs: 2.3 10*3/uL (ref 1.7–7.7)
Neutrophils Relative %: 58 %
Platelet Count: 163 10*3/uL (ref 150–400)
RBC: 4.71 MIL/uL (ref 3.87–5.11)
RDW: 14.6 % (ref 11.5–15.5)
WBC Count: 4 10*3/uL (ref 4.0–10.5)
nRBC: 0 % (ref 0.0–0.2)

## 2019-01-24 LAB — CMP (CANCER CENTER ONLY)
ALT: 20 U/L (ref 0–44)
AST: 18 U/L (ref 15–41)
Albumin: 4.2 g/dL (ref 3.5–5.0)
Alkaline Phosphatase: 68 U/L (ref 38–126)
Anion gap: 9 (ref 5–15)
BUN: 17 mg/dL (ref 8–23)
CO2: 23 mmol/L (ref 22–32)
Calcium: 9.5 mg/dL (ref 8.9–10.3)
Chloride: 110 mmol/L (ref 98–111)
Creatinine: 1 mg/dL (ref 0.44–1.00)
GFR, Est AFR Am: >60 mL/min (ref >60)
GFR, Estimated: 55 mL/min — ABNORMAL LOW (ref >60)
Glucose, Bld: 98 mg/dL (ref 70–99)
Potassium: 4.3 mmol/L (ref 3.5–5.1)
Sodium: 142 mmol/L (ref 135–145)
Total Bilirubin: 1.1 mg/dL (ref 0.3–1.2)
Total Protein: 7.1 g/dL (ref 6.5–8.1)

## 2019-01-25 ENCOUNTER — Telehealth: Payer: Self-pay | Admitting: Oncology

## 2019-01-25 NOTE — Telephone Encounter (Signed)
I left a message regarding schedule  

## 2019-01-26 ENCOUNTER — Encounter: Payer: Self-pay | Admitting: Oncology

## 2019-02-08 DIAGNOSIS — M7989 Other specified soft tissue disorders: Secondary | ICD-10-CM | POA: Diagnosis not present

## 2019-02-08 DIAGNOSIS — E782 Mixed hyperlipidemia: Secondary | ICD-10-CM | POA: Diagnosis not present

## 2019-02-08 DIAGNOSIS — E559 Vitamin D deficiency, unspecified: Secondary | ICD-10-CM | POA: Diagnosis not present

## 2019-02-08 DIAGNOSIS — E039 Hypothyroidism, unspecified: Secondary | ICD-10-CM | POA: Diagnosis not present

## 2019-02-09 DIAGNOSIS — R3915 Urgency of urination: Secondary | ICD-10-CM | POA: Diagnosis not present

## 2019-02-09 DIAGNOSIS — Z23 Encounter for immunization: Secondary | ICD-10-CM | POA: Diagnosis not present

## 2019-02-16 ENCOUNTER — Encounter: Payer: Self-pay | Admitting: Oncology

## 2019-02-23 ENCOUNTER — Other Ambulatory Visit: Payer: Self-pay | Admitting: Oncology

## 2019-02-23 DIAGNOSIS — C50411 Malignant neoplasm of upper-outer quadrant of right female breast: Secondary | ICD-10-CM

## 2019-02-23 NOTE — Progress Notes (Signed)
Elizabeth Ramirez called today to tell me that she still has the left cervical lymph node that was present before.  It is no bigger it is no smaller it is not tender.  This is on the left side of the neck while her breast cancer was on the right side.  I am going to set her up for CT of the neck and chest just to make sure were not dealing with a new problem here.

## 2019-03-07 ENCOUNTER — Other Ambulatory Visit: Payer: Self-pay

## 2019-03-07 ENCOUNTER — Encounter (HOSPITAL_COMMUNITY): Payer: Self-pay

## 2019-03-07 ENCOUNTER — Ambulatory Visit (HOSPITAL_COMMUNITY)
Admission: RE | Admit: 2019-03-07 | Discharge: 2019-03-07 | Disposition: A | Discharge status: Home / Self Care | Payer: Medicare Other | Source: Ambulatory Visit | Attending: Oncology | Admitting: Oncology

## 2019-03-07 DIAGNOSIS — Z170 Estrogen receptor positive status [ER+]: Secondary | ICD-10-CM | POA: Diagnosis not present

## 2019-03-07 DIAGNOSIS — R911 Solitary pulmonary nodule: Secondary | ICD-10-CM | POA: Diagnosis not present

## 2019-03-07 DIAGNOSIS — C50411 Malignant neoplasm of upper-outer quadrant of right female breast: Secondary | ICD-10-CM | POA: Insufficient documentation

## 2019-03-07 DIAGNOSIS — C50919 Malignant neoplasm of unspecified site of unspecified female breast: Secondary | ICD-10-CM | POA: Diagnosis not present

## 2019-03-07 MED ORDER — IOHEXOL 300 MG/ML  SOLN
100.0000 mL | Freq: Once | INTRAMUSCULAR | Status: AC | PRN
  Administered 2019-03-07: 100 mL via INTRAVENOUS

## 2019-03-07 MED ORDER — SODIUM CHLORIDE (PF) 0.9 % IJ SOLN
INTRAMUSCULAR | Status: AC
  Filled 2019-03-07: qty 50

## 2019-03-08 ENCOUNTER — Other Ambulatory Visit: Payer: Self-pay | Admitting: Oncology

## 2019-03-10 ENCOUNTER — Other Ambulatory Visit: Payer: Self-pay | Admitting: Oncology

## 2019-03-10 DIAGNOSIS — C50411 Malignant neoplasm of upper-outer quadrant of right female breast: Secondary | ICD-10-CM

## 2019-03-10 DIAGNOSIS — Z170 Estrogen receptor positive status [ER+]: Secondary | ICD-10-CM

## 2019-03-10 NOTE — Progress Notes (Signed)
I called Elizabeth Ramirez with the results of her scans which are very reassuring.  We do need to repeat a CT of the chest in a year or so just to make sure those small almost certainly benign lung spots are unchanged.  She is in agreement and I will set this up for early August next year.

## 2019-04-13 DIAGNOSIS — E039 Hypothyroidism, unspecified: Secondary | ICD-10-CM | POA: Diagnosis not present

## 2019-04-13 DIAGNOSIS — N1831 Chronic kidney disease, stage 3a: Secondary | ICD-10-CM | POA: Diagnosis not present

## 2019-04-13 DIAGNOSIS — Z1389 Encounter for screening for other disorder: Secondary | ICD-10-CM | POA: Diagnosis not present

## 2019-04-13 DIAGNOSIS — E559 Vitamin D deficiency, unspecified: Secondary | ICD-10-CM | POA: Diagnosis not present

## 2019-04-13 DIAGNOSIS — B009 Herpesviral infection, unspecified: Secondary | ICD-10-CM | POA: Diagnosis not present

## 2019-04-13 DIAGNOSIS — J309 Allergic rhinitis, unspecified: Secondary | ICD-10-CM | POA: Diagnosis not present

## 2019-04-13 DIAGNOSIS — D0511 Intraductal carcinoma in situ of right breast: Secondary | ICD-10-CM | POA: Diagnosis not present

## 2019-04-13 DIAGNOSIS — Z0000 Encounter for general adult medical examination without abnormal findings: Secondary | ICD-10-CM | POA: Diagnosis not present

## 2019-04-13 DIAGNOSIS — E782 Mixed hyperlipidemia: Secondary | ICD-10-CM | POA: Diagnosis not present

## 2019-04-13 DIAGNOSIS — Z86010 Personal history of colonic polyps: Secondary | ICD-10-CM | POA: Diagnosis not present

## 2019-04-26 DIAGNOSIS — L570 Actinic keratosis: Secondary | ICD-10-CM | POA: Diagnosis not present

## 2019-06-14 ENCOUNTER — Ambulatory Visit: Payer: Medicare Other | Attending: Internal Medicine

## 2019-06-14 DIAGNOSIS — U071 COVID-19: Secondary | ICD-10-CM

## 2019-06-14 DIAGNOSIS — R238 Other skin changes: Secondary | ICD-10-CM

## 2019-06-15 LAB — NOVEL CORONAVIRUS, NAA: SARS-CoV-2, NAA: NOT DETECTED

## 2019-07-07 ENCOUNTER — Ambulatory Visit: Payer: Medicare Other | Attending: Internal Medicine

## 2019-07-07 DIAGNOSIS — Z23 Encounter for immunization: Secondary | ICD-10-CM

## 2019-07-07 NOTE — Progress Notes (Signed)
   Covid-19 Vaccination Clinic  Name:  Elizabeth Ramirez    MRN: [id number] DOB: [birth year]  07/07/2019  [name] was observed post Covid-19 immunization for 15 minutes without incidence. She was provided with Vaccine Information Sheet and instruction to access the V-Safe system.   [name] was instructed to call 911 with any severe reactions post vaccine: Marland Kitchen Difficulty breathing  . Swelling of your face and throat  . A fast heartbeat  . A bad rash all over your body  . Dizziness and weakness    Immunizations Administered    Name Date Dose VIS Date Route   Pfizer COVID-19 Vaccine 07/07/2019  4:43 PM 0.3 mL 05/26/2019 Intramuscular   Manufacturer: [place]   Lot: [id number]   [place]: [id number]

## 2019-07-11 ENCOUNTER — Ambulatory Visit: Payer: Medicare Other

## 2019-07-28 ENCOUNTER — Ambulatory Visit: Payer: Medicare Other | Attending: Internal Medicine

## 2019-07-28 DIAGNOSIS — Z23 Encounter for immunization: Secondary | ICD-10-CM | POA: Insufficient documentation

## 2019-07-28 NOTE — Progress Notes (Signed)
   Covid-19 Vaccination Clinic  Name:  Elizabeth Ramirez    MRN: [id number] DOB: Nov 12, 1945  07/28/2019  [name] was observed post Covid-19 immunization for 15 minutes without incidence. She was provided with Vaccine Information Sheet and instruction to access the V-Safe system.   [name] was instructed to call 911 with any severe reactions post vaccine: Marland Kitchen Difficulty breathing  . Swelling of your face and throat  . A fast heartbeat  . A bad rash all over your body  . Dizziness and weakness    Immunizations Administered    Name Date Dose VIS Date Route   Pfizer COVID-19 Vaccine 07/28/2019 10:14 AM 0.3 mL 05/26/2019 Intramuscular   Manufacturer: [place]   Lot: [id number]   [place]: [id number]

## 2019-08-01 ENCOUNTER — Ambulatory Visit: Payer: Medicare Other

## 2019-09-28 DIAGNOSIS — L0390 Cellulitis, unspecified: Secondary | ICD-10-CM | POA: Diagnosis not present

## 2019-10-23 DIAGNOSIS — Z471 Aftercare following joint replacement surgery: Secondary | ICD-10-CM | POA: Diagnosis not present

## 2019-10-23 DIAGNOSIS — Z96612 Presence of left artificial shoulder joint: Secondary | ICD-10-CM | POA: Diagnosis not present

## 2019-12-04 ENCOUNTER — Other Ambulatory Visit: Payer: Self-pay | Admitting: Oncology

## 2019-12-04 DIAGNOSIS — Z9889 Other specified postprocedural states: Secondary | ICD-10-CM

## 2020-01-03 DIAGNOSIS — H33103 Unspecified retinoschisis, bilateral: Secondary | ICD-10-CM | POA: Diagnosis not present

## 2020-01-03 DIAGNOSIS — H35033 Hypertensive retinopathy, bilateral: Secondary | ICD-10-CM | POA: Diagnosis not present

## 2020-01-03 DIAGNOSIS — H35363 Drusen (degenerative) of macula, bilateral: Secondary | ICD-10-CM | POA: Diagnosis not present

## 2020-01-03 DIAGNOSIS — H35362 Drusen (degenerative) of macula, left eye: Secondary | ICD-10-CM | POA: Diagnosis not present

## 2020-01-17 ENCOUNTER — Ambulatory Visit
Admission: RE | Admit: 2020-01-17 | Discharge: 2020-01-17 | Disposition: A | Discharge status: Home / Self Care | Payer: Medicare Other | Source: Ambulatory Visit | Attending: Oncology | Admitting: Oncology

## 2020-01-17 ENCOUNTER — Other Ambulatory Visit: Payer: Self-pay

## 2020-01-17 ENCOUNTER — Other Ambulatory Visit: Payer: Self-pay | Admitting: Oncology

## 2020-01-17 DIAGNOSIS — Z853 Personal history of malignant neoplasm of breast: Secondary | ICD-10-CM | POA: Diagnosis not present

## 2020-01-17 DIAGNOSIS — Z9889 Other specified postprocedural states: Secondary | ICD-10-CM

## 2020-01-17 DIAGNOSIS — R920 Mammographic microcalcification found on diagnostic imaging of breast: Secondary | ICD-10-CM | POA: Diagnosis not present

## 2020-01-17 DIAGNOSIS — R921 Mammographic calcification found on diagnostic imaging of breast: Secondary | ICD-10-CM

## 2020-01-23 ENCOUNTER — Other Ambulatory Visit: Payer: Self-pay | Admitting: Oncology

## 2020-01-23 ENCOUNTER — Other Ambulatory Visit: Payer: Self-pay | Admitting: Diagnostic Radiology

## 2020-01-23 ENCOUNTER — Other Ambulatory Visit: Payer: Self-pay

## 2020-01-23 ENCOUNTER — Ambulatory Visit
Admission: RE | Admit: 2020-01-23 | Discharge: 2020-01-23 | Disposition: A | Discharge status: Home / Self Care | Payer: Medicare Other | Source: Ambulatory Visit | Attending: Oncology | Admitting: Oncology

## 2020-01-23 DIAGNOSIS — D0511 Intraductal carcinoma in situ of right breast: Secondary | ICD-10-CM

## 2020-01-23 DIAGNOSIS — N641 Fat necrosis of breast: Secondary | ICD-10-CM | POA: Diagnosis not present

## 2020-01-23 DIAGNOSIS — R921 Mammographic calcification found on diagnostic imaging of breast: Secondary | ICD-10-CM | POA: Diagnosis not present

## 2020-01-25 ENCOUNTER — Ambulatory Visit: Payer: Medicare Other | Admitting: Oncology

## 2020-01-25 ENCOUNTER — Other Ambulatory Visit: Payer: Self-pay | Admitting: *Deleted

## 2020-01-25 ENCOUNTER — Other Ambulatory Visit: Payer: Medicare Other

## 2020-01-25 DIAGNOSIS — Z170 Estrogen receptor positive status [ER+]: Secondary | ICD-10-CM

## 2020-01-25 DIAGNOSIS — C50411 Malignant neoplasm of upper-outer quadrant of right female breast: Secondary | ICD-10-CM

## 2020-01-26 ENCOUNTER — Inpatient Hospital Stay: Payer: Medicare Other | Attending: Oncology

## 2020-01-26 ENCOUNTER — Other Ambulatory Visit: Payer: Self-pay | Admitting: *Deleted

## 2020-01-26 ENCOUNTER — Other Ambulatory Visit: Payer: Self-pay

## 2020-01-26 ENCOUNTER — Ambulatory Visit (HOSPITAL_COMMUNITY)
Admission: RE | Admit: 2020-01-26 | Discharge: 2020-01-26 | Disposition: A | Discharge status: Home / Self Care | Payer: Medicare Other | Source: Ambulatory Visit | Attending: Oncology | Admitting: Oncology

## 2020-01-26 ENCOUNTER — Encounter (HOSPITAL_COMMUNITY): Payer: Self-pay

## 2020-01-26 DIAGNOSIS — Z800 Family history of malignant neoplasm of digestive organs: Secondary | ICD-10-CM | POA: Diagnosis not present

## 2020-01-26 DIAGNOSIS — I700 Atherosclerosis of aorta: Secondary | ICD-10-CM | POA: Diagnosis not present

## 2020-01-26 DIAGNOSIS — J439 Emphysema, unspecified: Secondary | ICD-10-CM | POA: Diagnosis not present

## 2020-01-26 DIAGNOSIS — Z807 Family history of other malignant neoplasms of lymphoid, hematopoietic and related tissues: Secondary | ICD-10-CM | POA: Diagnosis not present

## 2020-01-26 DIAGNOSIS — C50411 Malignant neoplasm of upper-outer quadrant of right female breast: Secondary | ICD-10-CM | POA: Diagnosis not present

## 2020-01-26 DIAGNOSIS — I2510 Atherosclerotic heart disease of native coronary artery without angina pectoris: Secondary | ICD-10-CM | POA: Diagnosis not present

## 2020-01-26 DIAGNOSIS — Z170 Estrogen receptor positive status [ER+]: Secondary | ICD-10-CM | POA: Insufficient documentation

## 2020-01-26 DIAGNOSIS — E039 Hypothyroidism, unspecified: Secondary | ICD-10-CM | POA: Insufficient documentation

## 2020-01-26 DIAGNOSIS — J984 Other disorders of lung: Secondary | ICD-10-CM | POA: Diagnosis not present

## 2020-01-26 DIAGNOSIS — Z8041 Family history of malignant neoplasm of ovary: Secondary | ICD-10-CM | POA: Diagnosis not present

## 2020-01-26 DIAGNOSIS — Z79899 Other long term (current) drug therapy: Secondary | ICD-10-CM | POA: Diagnosis not present

## 2020-01-26 DIAGNOSIS — Z923 Personal history of irradiation: Secondary | ICD-10-CM | POA: Insufficient documentation

## 2020-01-26 DIAGNOSIS — Z86000 Personal history of in-situ neoplasm of breast: Secondary | ICD-10-CM | POA: Insufficient documentation

## 2020-01-26 DIAGNOSIS — Z803 Family history of malignant neoplasm of breast: Secondary | ICD-10-CM | POA: Insufficient documentation

## 2020-01-26 LAB — CMP (CANCER CENTER ONLY)
ALT: 15 U/L (ref 0–44)
AST: 14 U/L — ABNORMAL LOW (ref 15–41)
Albumin: 3.7 g/dL (ref 3.5–5.0)
Alkaline Phosphatase: 88 U/L (ref 38–126)
Anion gap: 11 (ref 5–15)
BUN: 15 mg/dL (ref 8–23)
CO2: 23 mmol/L (ref 22–32)
Calcium: 10 mg/dL (ref 8.9–10.3)
Chloride: 107 mmol/L (ref 98–111)
Creatinine: 1.28 mg/dL — ABNORMAL HIGH (ref 0.44–1.00)
GFR, Est AFR Am: 47 mL/min — ABNORMAL LOW (ref >60)
GFR, Estimated: 41 mL/min — ABNORMAL LOW (ref >60)
Glucose, Bld: 100 mg/dL — ABNORMAL HIGH (ref 70–99)
Potassium: 4.7 mmol/L (ref 3.5–5.1)
Sodium: 141 mmol/L (ref 135–145)
Total Bilirubin: 1 mg/dL (ref 0.3–1.2)
Total Protein: 6.9 g/dL (ref 6.5–8.1)

## 2020-01-26 LAB — CBC WITH DIFFERENTIAL (CANCER CENTER ONLY)
Abs Immature Granulocytes: 0.02 10*3/uL (ref 0.00–0.07)
Basophils Absolute: 0 10*3/uL (ref 0.0–0.1)
Basophils Relative: 0 %
Eosinophils Absolute: 0.1 10*3/uL (ref 0.0–0.5)
Eosinophils Relative: 2 %
HCT: 40 % (ref 36.0–46.0)
Hemoglobin: 13.5 g/dL (ref 12.0–15.0)
Immature Granulocytes: 1 %
Lymphocytes Relative: 27 %
Lymphs Abs: 1.1 10*3/uL (ref 0.7–4.0)
MCH: 30.6 pg (ref 26.0–34.0)
MCHC: 33.8 g/dL (ref 30.0–36.0)
MCV: 90.7 fL (ref 80.0–100.0)
Monocytes Absolute: 0.4 10*3/uL (ref 0.1–1.0)
Monocytes Relative: 10 %
Neutro Abs: 2.4 10*3/uL (ref 1.7–7.7)
Neutrophils Relative %: 60 %
Platelet Count: 171 10*3/uL (ref 150–400)
RBC: 4.41 MIL/uL (ref 3.87–5.11)
RDW: 13.8 % (ref 11.5–15.5)
WBC Count: 4 10*3/uL (ref 4.0–10.5)
nRBC: 0 % (ref 0.0–0.2)

## 2020-01-26 MED ORDER — SODIUM CHLORIDE (PF) 0.9 % IJ SOLN
INTRAMUSCULAR | Status: AC
  Filled 2020-01-26: qty 50

## 2020-01-26 MED ORDER — IOHEXOL 300 MG/ML  SOLN
75.0000 mL | Freq: Once | INTRAMUSCULAR | Status: AC | PRN
  Administered 2020-01-26: 60 mL via INTRAVENOUS

## 2020-01-31 ENCOUNTER — Other Ambulatory Visit: Payer: Self-pay | Admitting: Oncology

## 2020-01-31 DIAGNOSIS — C50411 Malignant neoplasm of upper-outer quadrant of right female breast: Secondary | ICD-10-CM

## 2020-01-31 DIAGNOSIS — Z170 Estrogen receptor positive status [ER+]: Secondary | ICD-10-CM

## 2020-02-01 ENCOUNTER — Other Ambulatory Visit: Payer: Medicare Other

## 2020-02-01 ENCOUNTER — Other Ambulatory Visit: Payer: Self-pay

## 2020-02-01 ENCOUNTER — Inpatient Hospital Stay (HOSPITAL_BASED_OUTPATIENT_CLINIC_OR_DEPARTMENT_OTHER): Payer: Medicare Other | Admitting: Oncology

## 2020-02-01 ENCOUNTER — Inpatient Hospital Stay: Payer: Medicare Other

## 2020-02-01 VITALS — BP 159/77 | HR 93 | Temp 97.4°F | Resp 18 | Ht 64.0 in | Wt 241.8 lb

## 2020-02-01 DIAGNOSIS — R911 Solitary pulmonary nodule: Secondary | ICD-10-CM

## 2020-02-01 DIAGNOSIS — C50411 Malignant neoplasm of upper-outer quadrant of right female breast: Secondary | ICD-10-CM

## 2020-02-01 DIAGNOSIS — E039 Hypothyroidism, unspecified: Secondary | ICD-10-CM | POA: Diagnosis not present

## 2020-02-01 DIAGNOSIS — Z79899 Other long term (current) drug therapy: Secondary | ICD-10-CM | POA: Diagnosis not present

## 2020-02-01 DIAGNOSIS — Z170 Estrogen receptor positive status [ER+]: Secondary | ICD-10-CM

## 2020-02-01 DIAGNOSIS — R591 Generalized enlarged lymph nodes: Secondary | ICD-10-CM

## 2020-02-01 DIAGNOSIS — Z86000 Personal history of in-situ neoplasm of breast: Secondary | ICD-10-CM | POA: Diagnosis not present

## 2020-02-01 DIAGNOSIS — Z8041 Family history of malignant neoplasm of ovary: Secondary | ICD-10-CM | POA: Diagnosis not present

## 2020-02-01 DIAGNOSIS — Z800 Family history of malignant neoplasm of digestive organs: Secondary | ICD-10-CM | POA: Diagnosis not present

## 2020-02-01 DIAGNOSIS — R599 Enlarged lymph nodes, unspecified: Secondary | ICD-10-CM | POA: Insufficient documentation

## 2020-02-01 DIAGNOSIS — Z923 Personal history of irradiation: Secondary | ICD-10-CM | POA: Diagnosis not present

## 2020-02-01 NOTE — Progress Notes (Signed)
[facility]  [phone] [phone]     ID: Elizabeth Ramirez DOB: April 18, 1946  MR#: [id number]  LOV#:[id number]  Patient Care Team: Cari Caraway, MD as PCP - General (Family Medicine) Kirt Chew, Virgie Dad, MD as Consulting Physician (Oncology) Excell Seltzer, MD (Inactive) as Consulting Physician (General Surgery) Bobbye Charleston, MD as Consulting Physician (Obstetrics and Gynecology) Justice Britain, MD as Consulting Physician (Orthopedic Surgery) OTHER MD:   CHIEF COMPLAINT:  Ductal carcinoma in situ  CURRENT TREATMENT: observation   INTERVAL HISTORY: Elizabeth Ramirez returns today for follow-up of her estrogen receptor positive breast cancer. She continues on observation.  Since her last visit, she underwent bilateral diagnostic mammography with tomography at [facility] on 01/17/2020 showing: breast density category B; indeterminate right lumpectomy bed calcifications; no mammographic evidence of malignancy on the left.  She proceeded to biopsy on 01/23/2020 of the right breast area in question. Pathology from the procedure ([id number]) showed: fibroadenomatoid changes; fat necrosis with calcifications.  She also underwent restaging chest CT on 01/26/2020 showing: indeterminate 11 mm irregular nodule in superior left lower lobe; mildly progressive left axillary/subpectoral nodes; 10 mm left celiac axis node, also mildly progressive.   REVIEW OF SYSTEMS: Airica is feeling a little bit down partly because she has been so isolated over the past year and partly because of course she is now again having to deal with some cancer questions.  She did receive the Pfizer vaccine x2 and is looking forward to receiving the booster.  She just went back to using a trainer and is using the NOOM weight loss method.  She feels that this will work for her.  She has had no cough phlegm production or pleurisy, no fevers, no rash, no pruritus, no drenching sweats, has not  had any unexplained weight loss, and is not aware of any peripheral adenopathy.   BREAST CANCER HISTORY: From the original intake note:  Kaimana underwent Right lumpectomy and sentinel lymph node biopsy April 2005 for 4 mm, grade 1 tubular tumor (T1a No, stage IA), in the setting of DCIS. This was estrogen and progesterone receptor positive, HER-2 not amplified. Margins were ample. She participated in the MA-27 study and received anastrozole between June 2005 and June 2010, at which time she was released from follow-up.  More recently, Lisel had screening bilateral mammography showing a suspicious area in the right breast and on 12/14/2014 underwent right diagnostic mammography at the [facility] Center. The breast density was category A. There was a group of pleomorphic calcifications in the upper central right breast spanning up to 2.8 cm. Biopsy of this area was obtained 12/21/2014 ([id number]) and showed ductal carcinoma in situ, grade 2 or 3, estrogen receptor 95% positive, and progesterone receptor 60% positive, both with strong staining intensity.  The patient's case was presented at the multidisciplinary breast cancer conference 01/02/2015. At that time a preliminary plan was proposed, namely genetics counseling and if no mutation noted breast MRI to be followed by lumpectomy, radiation, and anti-estrogens.  Her subsequent history is as detailed below.   PAST MEDICAL HISTORY: Past Medical History:  Diagnosis Date  . Allergy    tetracycline, levofloxacin, crabs & bay leaves  . Arthritis    Rt knee, Rt shoulder  . Breast cancer ([place]) 2016; 2005   Separate primaries; 2016 - DCIS of R breast(surgery and radiation-last 04-04-15); ER/PR+ April 2005  . Cancer ([place])   . Ear infection 10/19/2013  . Genetic testing 01/18/2015   Negative - no mutations  found in any of 20 genes on the Breast/Ovarian Cancer Panel.  Additionally, no variants of uncertain significance (VUSs) were found.  The  Breast/Ovarian gene panel offered by [facility] [name], MD) includes sequencing and deletion/duplication analysis for the following 19 genes:  ATM, BARD1, BRCA1, BRCA2, BRIP1, CDH1, CHEK2, FANCC, MLH1, MSH2, MSH6, NBN, PALB2, PMS2, PTEN, RAD51C, RAD51D, TP53, and XRCC2.  This panel also includes deletion/duplication analysis (without sequencing) for one gene, EPCAM.  Date of report is January 14, 2015.    Marland Kitchen Heart murmur   . History of kidney stones    x1 '71  . Hypothyroid 07/29/2011  . Hypothyroidism   . Malignant neoplasm of upper-outer quadrant of right breast in female, estrogen receptor positive ([place]) 01/14/2015  . OA (osteoarthritis) of knee 05/27/2015  . Obesity, morbid, BMI 40.0-49.9 ([place]) 12/31/2015  . Other chronic pain   . Personal history of radiation therapy   . PONV (postoperative nausea and vomiting)    extreme nausea and vomiting - cannot tolerate scopalamine   . 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 Seasonal allergies   . Thyroid disease   . Vitamin D deficiency     PAST SURGICAL HISTORY: Past Surgical History:  Procedure Laterality Date  . APPENDECTOMY  08/23/2006  . BREAST LUMPECTOMY Right 09/2003   DCIS  . BREAST LUMPECTOMY Right 2016  . BREAST LUMPECTOMY WITH NEEDLE LOCALIZATION Right 02/11/2015   Procedure: WIRE BRACKETED RIGHT BREAST LUMPECTOMY ;  Surgeon: Excell Seltzer, MD;  Location: [facility];  Service: General;  Laterality: Right;  . CATARACT EXTRACTION, BILATERAL  10-05-2017 left ; 11-09-2017 right   Dr Kandy Garrison   . colonoscopy with biopsy  12/28/2003   neg  . REVERSE SHOULDER ARTHROPLASTY Left 07/14/2018   Procedure: REVERSE SHOULDER ARTHROPLASTY;  Surgeon: Justice Britain, MD;  Location: [facility];  Service: Orthopedics;  Laterality: Left;  Interscalene Block  . TONSILLECTOMY     1954  . TOTAL KNEE ARTHROPLASTY Right 05/27/2015    Procedure: RIGHT TOTAL KNEE ARTHROPLASTY;  Surgeon: Gaynelle Arabian, MD;  Location: [facility];  Service: Orthopedics;  Laterality: Right;  . WISDOM TOOTH EXTRACTION Bilateral 1965    FAMILY HISTORY Family History  Problem Relation Age of Onset  . Dementia Mother   . Other Mother 37       "spot on lung"; secondhand smoke exposure  . Pulmonary fibrosis Father   . Diabetes Brother   . Thyroid disease Brother   . Leukemia Brother 70       chronic lymphocytic   . Stomach cancer Maternal Grandmother        dx. 31s  . Heart attack Maternal Grandfather   . Diabetes Paternal Grandfather   . Stroke Paternal Grandfather   . Colitis Paternal Grandmother   . Parkinson's disease Maternal Aunt   . Diabetes Paternal Aunt   . Other Paternal Aunt        bowel obstruction  . Heart Problems Paternal Uncle   . Ovarian cancer Maternal Aunt        dx. 30s; paternal half sister of her mother  . Multiple myeloma Maternal Aunt   . Multiple myeloma Maternal Aunt 75  . Kidney failure Paternal Aunt   . Pulmonary fibrosis Paternal Aunt   . Stroke Paternal Aunt   . Heart Problems Paternal Aunt   . Emphysema Paternal Uncle   . Heart Problems Paternal Uncle   .  Lung cancer Paternal Uncle        smoker  . Colon cancer Cousin   . Breast cancer Cousin        dx. 6s   [facility] father died at the age of 62 from pulmonary fibrosis. Her mother died at age 6 with severe dementia. The patient had one brother no sisters. Her maternal grandmother died from stomach cancer the age of 13. The patient's mother had a half sister with ovarian cancer.   GYNECOLOGIC HISTORY:  No LMP recorded. Patient is postmenopausal.  menarche age 69, she isGX P0. Menopause 2003. Did not take hormone replacement   SOCIAL HISTORY:  Works as a Cabin crew, Publishing copy in renovations. She retired from [employer] as of 2018 and is now doing some Electronics engineer. At home it's her and Henriette Combs , who also is in real estate, with her own  investment business.Katharine Look is a catholic, though not currently practicing    ADVANCED DIRECTIVES:  [name] is Chief Technology Officer of attorney   HEALTH MAINTENANCE: Social History   Tobacco Use  . Smoking status: Former Smoker    Packs/day: 1.00    Years: 9.00    Pack years: 9.00    Types: Cigarettes    Quit date: 06/16/1983    Years since quitting: 36.6  . Smokeless tobacco: Never Used  Substance Use Topics  . Alcohol use: Yes    Alcohol/week: 7.0 standard drinks    Types: 7 Glasses of wine per week    Comment: one glass w/ dinner  . Drug use: No     Colonoscopy:  20/15, [place] she knee  PAP: up-to-date/[facility]  Bone density: October 2016; normal  Lipid panel:  Allergies  Allergen Reactions  . Levofloxacin Other (See Comments)    Affected tendons, severe pain and swelling  . Tetracyclines & Related Swelling    tongue swelling and peeling  . Other Diarrhea and Rash    Crabs - diarrhea. Bay leaves - rash in her mouth  . Ciprofloxacin     Tendon problems   . Flonase [Fluticasone]     Nasal spray cause nose bleeds  . Ibuprofen     Gets mouth ulcers if she takes for more than 3 days; Aleve okay   . Nitrofurantoin Monohyd Macro Hives  . Scopolamine     "Headache and redness of eyes"    Current Outpatient Medications  Medication Sig Dispense Refill  . ipratropium (ATROVENT) 0.03 % nasal spray Place 1 spray into both nostrils daily as needed (seasonal allergies.).     Marland Kitchen levothyroxine (SYNTHROID, LEVOTHROID) 137 MCG tablet Take 137 mcg by mouth daily before breakfast.     No current facility-administered medications for this visit.    OBJECTIVE: White woman who appears stated age  74:   02/01/20 1305  BP: (!) 159/77  Pulse: 93  Resp: 18  Temp: (!) 97.4 F (36.3 C)  SpO2: 96%     Body mass index is 41.5 kg/m.    ECOG FS:1 - Symptomatic but completely ambulatory  Sclerae unicteric, EOMs intact Wearing a mask No cervical or supraclavicular  adenopathy Lungs no rales or rhonchi Heart regular rate and rhythm Abd soft, nontender, positive bowel sounds MSK no focal spinal tenderness, no upper extremity lymphedema Neuro: nonfocal, well oriented, appropriate affect Breasts: The right breast is status post lumpectomy and recent biopsy.  There is no ecchymosis.  There is some distortion of the breast parenchyma due to scar tissue.  There is no evidence  of local recurrence.  The left breast is benign.  Both axillae are benign and specifically I do not palpate any adenopathy in the left axilla  RESULTS:  CMP     Component Value Date/Time   NA 141 01/26/2020 1220   K 4.7 01/26/2020 1220   CL 107 01/26/2020 1220   CO2 23 01/26/2020 1220   GLUCOSE 100 (H) 01/26/2020 1220   BUN 15 01/26/2020 1220   CREATININE 1.28 (H) 01/26/2020 1220   CALCIUM 10.0 01/26/2020 1220   PROT 6.9 01/26/2020 1220   ALBUMIN 3.7 01/26/2020 1220   AST 14 (L) 01/26/2020 1220   ALT 15 01/26/2020 1220   ALKPHOS 88 01/26/2020 1220   BILITOT 1.0 01/26/2020 1220   GFRNONAA 41 (L) 01/26/2020 1220   GFRAA 47 (L) 01/26/2020 1220    INo results found for: SPEP, UPEP  Lab Results  Component Value Date   WBC 4.0 01/26/2020   NEUTROABS 2.4 01/26/2020   HGB 13.5 01/26/2020   HCT 40.0 01/26/2020   MCV 90.7 01/26/2020   PLT 171 01/26/2020      Chemistry      Component Value Date/Time   NA 141 01/26/2020 1220   K 4.7 01/26/2020 1220   CL 107 01/26/2020 1220   CO2 23 01/26/2020 1220   BUN 15 01/26/2020 1220   CREATININE 1.28 (H) 01/26/2020 1220      Component Value Date/Time   CALCIUM 10.0 01/26/2020 1220   ALKPHOS 88 01/26/2020 1220   AST 14 (L) 01/26/2020 1220   ALT 15 01/26/2020 1220   BILITOT 1.0 01/26/2020 1220       Lab Results  Component Value Date   LABCA2 22 12/04/2008    No components found for: [id number]  No results for input(s): INR in the last 168 hours.  Urinalysis    Component Value Date/Time   COLORURINE YELLOW  05/20/2015 1120   APPEARANCEUR CLEAR 05/20/2015 1120   LABSPEC 1.009 05/20/2015 1120   PHURINE 6.5 05/20/2015 1120   GLUCOSEU NEGATIVE 05/20/2015 1120   HGBUR NEGATIVE 05/20/2015 1120   BILIRUBINUR NEGATIVE 05/20/2015 1120   BILIRUBINUR neg 12/19/2014 1427   KETONESUR NEGATIVE 05/20/2015 1120   PROTEINUR NEGATIVE 05/20/2015 1120   UROBILINOGEN 0.2 12/19/2014 1427   NITRITE NEGATIVE 05/20/2015 1120   LEUKOCYTESUR NEGATIVE 05/20/2015 1120    STUDIES: CT Chest W Contrast  Result Date: 01/26/2020 CLINICAL DATA:  Follow-up pulmonary nodules. History of right breast cancer, diagnosed 2005 and 2016, status post right lumpectomy, XRT complete in 2016. EXAM: CT CHEST WITH CONTRAST TECHNIQUE: Multidetector CT imaging of the chest was performed during intravenous contrast administration. CONTRAST:  37m OMNIPAQUE IOHEXOL 300 MG/ML  SOLN COMPARISON:  CT chest dated 03/07/2019 FINDINGS: Cardiovascular: Heart is normal in size.  No pericardial effusion. No evidence of thoracic aortic aneurysm. Mild atherosclerotic calcifications of the aortic arch. Mild coronary atherosclerosis of the LAD. Mediastinum/Nodes: No suspicious mediastinal or right axillary lymphadenopathy. Prominent 11 mm short axis left axillary node (series 2/image 20), previously 8 mm. Prominent left subpectoral nodes measuring up to 9 mm short axis (series 2/image 20), previously 7 mm. Visualized thyroid is unremarkable. Lungs/Pleura: Mild biapical pleural-parenchymal scarring. No focal consolidation. Dominant 11 x 7 mm irregular nodule in the superior segment left lower lobe (series 5/image 64), new. Additional scattered small left lung nodules measuring up to 5 mm in the anterior left upper lobe (series 5/image 84) are unchanged, noting motion degradation. No pleural effusion or pneumothorax. Upper Abdomen: Visualized upper abdomen is  notable for a 10 mm short axis left celiac axis node (series 2/image 151), previously 7 mm. 16 mm short axis  left lower pole renal cyst and mild vascular calcifications. Musculoskeletal: Degenerative changes of the visualized thoracolumbar spine. IMPRESSION: 11 x 7 mm irregular nodule in the superior segment left lower lobe, new. This is indeterminate but raises concern for primary bronchogenic neoplasm or metastasis. This is just at the size threshold for PET sensitivity. Consider PET-CT for further evaluation. Mildly progressive left axillary/subpectoral nodes measuring up to 11 mm short axis. Contralateral nodal metastases would be unusual. Reactive nodes or low-grade lymphoproliferative disorder are also possible. This can be evaluated at the time of PET-CT. 10 mm short axis left celiac axis node, also minimally progressive, warranting attention at the time of PET-CT. Aortic Atherosclerosis (ICD10-I70.0) and Emphysema (ICD10-J43.9). Electronically Signed   By: [name] M.D.   On: 01/26/2020 16:22   MM DIAG BREAST TOMO BILATERAL  Result Date: 01/17/2020 CLINICAL DATA:  74 year old female with history of treated right breast cancer in 2005 and recurrence in 2016 presents for routine annual bilateral mammogram. No current breast related issues. EXAM: DIGITAL DIAGNOSTIC BILATERAL MAMMOGRAM WITH TOMO AND CAD COMPARISON:  Previous exam(s). ACR Breast Density Category b: There are scattered areas of fibroglandular density. FINDINGS: There has been interval development of coarse heterogeneous calcifications within the lumpectomy bed spanning approximately 1.5 x 2.3 cm. No additional suspicious mammographic findings are identified in either breast. The parenchymal pattern is otherwise stable. Mammographic images were processed with CAD. IMPRESSION: 1. Indeterminate right lumpectomy bed calcifications. Recommendation is for stereotactic biopsy. 2. No mammographic evidence of malignancy on the left. RECOMMENDATION: Stereotactic biopsy of the right breast. I have discussed the findings and recommendations with the  patient. If applicable, a reminder letter will be sent to the patient regarding the next appointment. BI-RADS CATEGORY  4: Suspicious. Electronically Signed   By: Kristopher Oppenheim M.D.   On: 01/17/2020 11:31   MM CLIP PLACEMENT RIGHT  Result Date: 01/23/2020 CLINICAL DATA:  Patient status post stereotactic guided core needle biopsy right breast calcifications. EXAM: DIAGNOSTIC RIGHT MAMMOGRAM POST STEREOTACTIC BIOPSY COMPARISON:  Previous exam(s). FINDINGS: Mammographic images were obtained following stereotactic guided biopsy of right breast calcifications. The biopsy marking clip is in expected position at the site of biopsy. IMPRESSION: Appropriate positioning of the X shaped shaped biopsy marking clip at the site of biopsy in the retroareolar slightly lateral right breast. Final Assessment: Post Procedure Mammograms for Marker Placement Electronically Signed   By: Lovey Newcomer M.D.   On: 01/23/2020 12:42   MM RT BREAST BX W LOC DEV 1ST LESION IMAGE BX SPEC STEREO GUIDE  Addendum Date: 01/25/2020   ADDENDUM REPORT: 01/24/2020 11:46 ADDENDUM: Pathology revealed FIBROADENOMATOID CHANGES, FAT NECROSIS WITH CALCIFICATIONS of the RIGHT breast, anterior. This was found to be concordant by Dr. Lovey Newcomer. Pathology results were discussed with the patient by telephone. The patient reported doing well after the biopsy with tenderness at the site. Post biopsy instructions and care were reviewed and questions were answered. The patient was encouraged to call [facility] for any additional concerns. The patient was instructed to return for annual diagnostic mammography and informed a reminder notice would be sent regarding this appointment. Pathology results reported by Stacie Acres RN on 01/24/2020. Electronically Signed   By: Lovey Newcomer M.D.   On: 01/24/2020 11:46   Result Date: 01/25/2020 CLINICAL DATA:  Indeterminate right breast calcifications at the lumpectomy site.  EXAM: RIGHT BREAST  STEREOTACTIC CORE NEEDLE BIOPSY COMPARISON:  Previous exams. FINDINGS: The patient and I discussed the procedure of stereotactic-guided biopsy including benefits and alternatives. We discussed the high likelihood of a successful procedure. We discussed the risks of the procedure including infection, bleeding, tissue injury, clip migration, and inadequate sampling. Informed written consent was given. The usual time out protocol was performed immediately prior to the procedure. Using sterile technique and 1% Lidocaine as local anesthetic, under stereotactic guidance, a 9 gauge vacuum assisted device was used to perform core needle biopsy of calcifications within the right breast lumpectomy site anterior depth using a cranial approach. Specimen radiograph was performed showing calcifications. Specimens with calcifications are identified for pathology. Lesion quadrant: Upper outer quadrant At the conclusion of the procedure, X shaped tissue marker clip was deployed into the biopsy cavity. Follow-up 2-view mammogram was performed and dictated separately. IMPRESSION: Stereotactic-guided biopsy of right breast calcifications. No apparent complications. Electronically Signed: By: Lovey Newcomer M.D. On: 01/23/2020 12:44    ASSESSMENT: 74 y.o. [place] woman  (1) status post right lumpectomy April 2005 for a pT1a pN0, stage IA invasive breast cancer, estrogen and progesterone receptor positive, HER-2 negative, in the setting of DCIS;   (a) did not receive radiation  (b) status post anastrozole for 5 years completed June 2010  (2) status post right breast upper outer quadrant biopsy 12/21/2014 for ductal carcinoma in situ, grade 2 or 3, estrogen and progesterone receptor positive.  (3) Status post right lumpectomy 02/11/2015 showing ductal carcinoma in situ, intermediate grade, measuring 0.9 cm, with close but negative margins  (4) adjuvant radiation: 03/07/2015-04/04/2015  Right breast/ 42.5 Gy at 2.5 Gy per  fraction x 17 fractions.  Right breast boost/ 7.5 Gy at 2.5 Gy per fraction x 3 fractions  (5) genetic testing 01/14/2015 through the [facility] Panel offered by [facility] [name], MD) showed no deleterious mutations in ATM, BARD1, BRCA1, BRCA2, BRIP1, CDH1, CHEK2, FANCC, MLH1, MSH2, MSH6, NBN, PALB2, PMS2, PTEN, RAD51C, RAD51D, TP53, and XRCC2. This panel also includes deletion/duplication analysis (without sequencing) for one gene, EPCAM.  (6) resumed anastrozole 07/06/2014, discontinued March 2017 with arthralgias and myalgias developing   (a) started letrozole 10/14/2015--discontinued after a brief course secondary to side effects  (b) Bone density 03/29/2015 was normal, with the lowest T score at 0.4   PLAN: Charonda is now 5 years out from definitive surgery for her breast cancer with no evidence of disease recurrence.  This is very favorable.  At this point we would be considering discharging her from follow-up but she is having some findings of concern.  Despite the benign biopsy the radiologist has suggested that she continue on diagnostic mammography for at least another year until his insurance goes along of course we will go along with that as well.  The more important issue are the findings on the CT of the chest.  We could be dealing with a primary lung cancer or a metastasis.  She could have a low-grade lymphoma given the adenopathy in the celiac axis and left axilla, or she could have 3 benign areas, lung scar, and then some reactive lymph nodes in a couple of places.  We can sort this out on the basis of the CT so were going to obtain a PET scan.  Depending on those results we will decide whether she needs biopsy treatment or simply observation  Tentatively I am scheduling her to see me the second week in September by which  time we should have at least the initial results  Total encounter time 35 minutes.*  Aevah Stansbery, Virgie Dad, MD  02/01/20  1:42 PM Medical Oncology and Hematology [facility] [place], [place] [address] Tel. [phone]    Fax. [phone]    I, Wilburn Mylar, am acting as scribe for Dr. Virgie Dad. Darsi Tien.  I, Lurline Del MD, have reviewed the above documentation for accuracy and completeness, and I agree with the above.   *Total Encounter Time as defined by the Centers for Medicare and Medicaid Services includes, in addition to the face-to-face time of a patient visit (documented in the note above) non-face-to-face time: obtaining and reviewing outside history, ordering and reviewing medications, tests or procedures, care coordination (communications with other health care professionals or caregivers) and documentation in the medical record.

## 2020-02-05 ENCOUNTER — Encounter: Payer: Self-pay | Admitting: Oncology

## 2020-02-07 ENCOUNTER — Other Ambulatory Visit: Payer: Self-pay | Admitting: Oncology

## 2020-02-08 ENCOUNTER — Other Ambulatory Visit: Payer: Self-pay

## 2020-02-08 ENCOUNTER — Encounter (HOSPITAL_COMMUNITY)
Admission: RE | Admit: 2020-02-08 | Discharge: 2020-02-08 | Disposition: A | Discharge status: Home / Self Care | Payer: Medicare Other | Source: Ambulatory Visit | Attending: Oncology | Admitting: Oncology

## 2020-02-08 ENCOUNTER — Telehealth: Payer: Self-pay | Admitting: Oncology

## 2020-02-08 ENCOUNTER — Other Ambulatory Visit: Payer: Self-pay | Admitting: Oncology

## 2020-02-08 DIAGNOSIS — I700 Atherosclerosis of aorta: Secondary | ICD-10-CM | POA: Diagnosis not present

## 2020-02-08 DIAGNOSIS — R591 Generalized enlarged lymph nodes: Secondary | ICD-10-CM | POA: Insufficient documentation

## 2020-02-08 DIAGNOSIS — C50919 Malignant neoplasm of unspecified site of unspecified female breast: Secondary | ICD-10-CM | POA: Diagnosis not present

## 2020-02-08 DIAGNOSIS — R161 Splenomegaly, not elsewhere classified: Secondary | ICD-10-CM | POA: Insufficient documentation

## 2020-02-08 DIAGNOSIS — Z6841 Body Mass Index (BMI) 40.0 and over, adult: Secondary | ICD-10-CM | POA: Diagnosis not present

## 2020-02-08 DIAGNOSIS — I2510 Atherosclerotic heart disease of native coronary artery without angina pectoris: Secondary | ICD-10-CM | POA: Diagnosis not present

## 2020-02-08 DIAGNOSIS — C50411 Malignant neoplasm of upper-outer quadrant of right female breast: Secondary | ICD-10-CM | POA: Diagnosis not present

## 2020-02-08 DIAGNOSIS — R599 Enlarged lymph nodes, unspecified: Secondary | ICD-10-CM

## 2020-02-08 DIAGNOSIS — R911 Solitary pulmonary nodule: Secondary | ICD-10-CM | POA: Insufficient documentation

## 2020-02-08 DIAGNOSIS — K7689 Other specified diseases of liver: Secondary | ICD-10-CM | POA: Diagnosis not present

## 2020-02-08 DIAGNOSIS — Z170 Estrogen receptor positive status [ER+]: Secondary | ICD-10-CM

## 2020-02-08 DIAGNOSIS — Z01419 Encounter for gynecological examination (general) (routine) without abnormal findings: Secondary | ICD-10-CM | POA: Diagnosis not present

## 2020-02-08 LAB — GLUCOSE, CAPILLARY: Glucose-Capillary: 93 mg/dL (ref 70–99)

## 2020-02-08 MED ORDER — FLUDEOXYGLUCOSE F - 18 (FDG) INJECTION
13.2200 | Freq: Once | INTRAVENOUS | Status: AC
  Administered 2020-02-08: 13.22 via INTRAVENOUS

## 2020-02-08 NOTE — Telephone Encounter (Signed)
Scheduled phone visit per 8/25 staff msg. Pt confirmed appt date, time, and that it will be a phone visit.

## 2020-02-09 ENCOUNTER — Other Ambulatory Visit: Payer: Self-pay | Admitting: Surgery

## 2020-02-09 ENCOUNTER — Inpatient Hospital Stay (HOSPITAL_BASED_OUTPATIENT_CLINIC_OR_DEPARTMENT_OTHER): Payer: Medicare Other | Admitting: Oncology

## 2020-02-09 ENCOUNTER — Telehealth: Payer: Self-pay | Admitting: Oncology

## 2020-02-09 DIAGNOSIS — R591 Generalized enlarged lymph nodes: Secondary | ICD-10-CM | POA: Insufficient documentation

## 2020-02-09 DIAGNOSIS — C50411 Malignant neoplasm of upper-outer quadrant of right female breast: Secondary | ICD-10-CM | POA: Diagnosis not present

## 2020-02-09 DIAGNOSIS — Z853 Personal history of malignant neoplasm of breast: Secondary | ICD-10-CM | POA: Diagnosis not present

## 2020-02-09 DIAGNOSIS — R590 Localized enlarged lymph nodes: Secondary | ICD-10-CM | POA: Diagnosis not present

## 2020-02-09 DIAGNOSIS — Z170 Estrogen receptor positive status [ER+]: Secondary | ICD-10-CM | POA: Diagnosis not present

## 2020-02-09 NOTE — Telephone Encounter (Signed)
Scheduled appt per 8/27 sch msg - pt is aware of appt date and time

## 2020-02-09 NOTE — Progress Notes (Signed)
I called Elizabeth Ramirez with the results of her PET scan which are most consistent with a lymphoma.  We discussed the working diagnosis of lymphoma and she understands we need a lymph node to get data and architecture.  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  That is about as much as we could get through today since we do not have further information  I am referring her to surgery for right inguinal lymph node excisional biopsy and she will see me in about 3 weeks by which time we should have a definitive diagnosis and should be able to discuss prognosis and treatment.  I have instructed her to call my nurse if she has any other questions or other problems develop before she returns to see me

## 2020-02-12 ENCOUNTER — Telehealth: Payer: Self-pay | Admitting: Oncology

## 2020-02-12 DIAGNOSIS — Z1159 Encounter for screening for other viral diseases: Secondary | ICD-10-CM | POA: Diagnosis not present

## 2020-02-12 NOTE — Telephone Encounter (Signed)
No 8/27 los. No changes made to pt's schedule.

## 2020-02-15 ENCOUNTER — Other Ambulatory Visit: Payer: Self-pay

## 2020-02-15 ENCOUNTER — Encounter (HOSPITAL_BASED_OUTPATIENT_CLINIC_OR_DEPARTMENT_OTHER): Payer: Self-pay | Admitting: Surgery

## 2020-02-15 DIAGNOSIS — D122 Benign neoplasm of ascending colon: Secondary | ICD-10-CM | POA: Diagnosis not present

## 2020-02-15 DIAGNOSIS — Z86010 Personal history of colonic polyps: Secondary | ICD-10-CM | POA: Diagnosis not present

## 2020-02-15 DIAGNOSIS — D120 Benign neoplasm of cecum: Secondary | ICD-10-CM | POA: Diagnosis not present

## 2020-02-20 ENCOUNTER — Other Ambulatory Visit (HOSPITAL_COMMUNITY)
Admission: RE | Admit: 2020-02-20 | Discharge: 2020-02-20 | Disposition: A | Discharge status: Home / Self Care | Payer: Medicare Other | Source: Ambulatory Visit | Attending: Surgery | Admitting: Surgery

## 2020-02-20 DIAGNOSIS — D122 Benign neoplasm of ascending colon: Secondary | ICD-10-CM | POA: Diagnosis not present

## 2020-02-20 DIAGNOSIS — Z20822 Contact with and (suspected) exposure to covid-19: Secondary | ICD-10-CM | POA: Insufficient documentation

## 2020-02-20 DIAGNOSIS — Z01812 Encounter for preprocedural laboratory examination: Secondary | ICD-10-CM | POA: Diagnosis not present

## 2020-02-20 DIAGNOSIS — D120 Benign neoplasm of cecum: Secondary | ICD-10-CM | POA: Diagnosis not present

## 2020-02-20 LAB — SARS CORONAVIRUS 2 (TAT 6-24 HRS): SARS Coronavirus 2: NEGATIVE

## 2020-02-20 NOTE — Progress Notes (Signed)

## 2020-02-22 NOTE — Anesthesia Preprocedure Evaluation (Addendum)
Anesthesia Evaluation  Patient identified by MRN, date of birth, ID band Patient awake    Reviewed: Allergy & Precautions, NPO status , Patient's Chart, lab work & pertinent test results  History of Anesthesia Complications (+) PONV and history of anesthetic complications  Airway Mallampati: I  TM Distance: >3 FB Neck ROM: Full    Dental no notable dental hx. (+) Teeth Intact, Caps, Dental Advisory Given   Pulmonary neg pulmonary ROS, former smoker,    Pulmonary exam normal breath sounds clear to auscultation       Cardiovascular negative cardio ROS Normal cardiovascular exam+ Valvular Problems/Murmurs  Rhythm:Regular Rate:Normal     Neuro/Psych negative neurological ROS  negative psych ROS   GI/Hepatic negative GI ROS, Neg liver ROS,   Endo/Other  negative endocrine ROSHypothyroidism Morbid obesity  Renal/GU negative Renal ROS  negative genitourinary   Musculoskeletal negative musculoskeletal ROS (+) Arthritis ,   Abdominal (+) + obese,   Peds negative pediatric ROS (+)  Hematology negative hematology ROS (+)   Anesthesia Other Findings   Reproductive/Obstetrics negative OB ROS                                                              Anesthesia Evaluation  Patient identified by MRN, date of birth, ID band Patient awake                         Lab Results  Component Value Date   WBC 4.0 01/26/2020   HGB 13.5 01/26/2020   HCT 40.0 01/26/2020   MCV 90.7 01/26/2020   PLT 171 01/26/2020   Lab Results  Component Value Date   INR 0.94 05/20/2015   Lab Results  Component Value Date   CREATININE 1.28 (H) 01/26/2020   BUN 15 01/26/2020   NA 141 01/26/2020   K 4.7 01/26/2020   CL 107 01/26/2020   CO2 23 01/26/2020    Anesthesia Plan Comments:         Anesthesia Quick Evaluation  Anesthesia Physical  Anesthesia Plan  ASA: III  Anesthesia  Plan: General   Post-op Pain Management:    Induction: Intravenous  PONV Risk Score and Plan: 4 or greater and Ondansetron, Dexamethasone, Treatment may vary due to age or medical condition, Metaclopromide and TIVA  Airway Management Planned: LMA  Additional Equipment: None  Intra-op Plan:   Post-operative Plan: Extubation in OR  Informed Consent: I have reviewed the patients History and Physical, chart, labs and discussed the procedure including the risks, benefits and alternatives for the proposed anesthesia with the patient or authorized representative who has indicated his/her understanding and acceptance.     Dental advisory given  Plan Discussed with: CRNA, Anesthesiologist and Surgeon  Anesthesia Plan Comments: (See PST note 07/12/2018, Konrad Felix, PA-C)       Anesthesia Quick Evaluation

## 2020-02-22 NOTE — H&P (Signed)
Elizabeth Ramirez  Location: [facility] Patient #: [id number] DOB: 09/01/1945 Single / Language: Elizabeth Ramirez / Race: White Female  History of Present Illness   The patient is a 74 year old female who presents with a complaint of lymph node.  The PCP is Dr. Carlis Stable.  The patient was referred by Magrinat  Her partner, [name], is with her.  She has been followed by Dr. [name] for right breast cancer. She underwent a CT scan in 2020 which showed some pulmonary nodules that they recommend follow-up. The patient has had no systemic symptoms of fever, weight loss, myalgias, or night sweats.  She underwent a chest CT scan on 01/26/2020 - which showed: 1. 11 x 7 mm irregular nodule in the superior segment left lower lobe, 2. Mildly progressive left axillary/subpectoral nodes measuring up to 11 mm short axis. Contralateral nodal metastases would be unusual. Reactive nodes or low-grade lymphoproliferative disorder are also possible. 3. 10 mm short axis left celiac axis node, also minimally progressive, 4. Aortic Atherosclerosis (ICD10-I70.0) and Emphysema  She had a PET scan on 02/08/2020 which showed: 1. Generalized nodal enlargement and or hypermetabolic lymph nodes with splenic enlargement and increased metabolic activity findings are most suggestive of lymphoproliferative disorder/lymphoma.   RIGHT groin lymph node with considerable FDG uptake may be the best target for sampling. Would avoid LEFT axillary lymph nodes given that the patient reportedly has had recent COVID-19 vaccination which can be confounding on PET scan. Correlate with side of vaccine administration. Metastatic disease is also considered though would be unusual in the setting of breast cancer with pelvic node predominant disease and splenic enlargement with mild hypermetabolism. 2. Small lymph nodes in the neck and potential RIGHT parotid lesion, this  could be related but would suggest dedicated assessment of the parotid gland for further evaluation, this could be performed with CT or ultrasound as an initial means in further evaluating this area. 3. Small lung nodule in the LEFT lower lobe is nonspecific in terms of FDG uptake suggesting indolent process. Morphologic features raising the question of bronchogenic neoplasm as outlined previously. 4. Mild increased FDG uptake in the RIGHT breast may relate to recent biopsy at the site of previous surgery, refer to biopsy results and mammography for further information. 5. Lobular hepatic contours could also be seen in the setting of liver disease, correlate with any clinical or laboratory evidence of liver disease. Liver disease could be an alternative explanation for splenic enlargement. 6. Activation of brown fat along the spine and potentially in the area of the occipital region and posterior neck.  I talked to the patient about a right inguinal lymph node excision. I discussed the potential complications including bleeding, infection, nerve injury, and seroma.   Review of Systems as stated in this history (HPI) or in the review of systems. Otherwise all other 12 point ROS are negative  Past Medical History: 1. PET scan with lymphadenopathy 2. 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 Morbid obesity 5. Emphysema on CT scan 6. Lap appendectomy - 2008 - [facility] 7. left shoulder replaced - Jan 2020 - Supple 8. 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 She saw Dr. Diona Browner in 2018 for some dysrhythmia.  But this evaluation was negative and she has not needed any follow  up.  Social History: Her partner, [name], is with her. She has no children. She works Marketing executive  The patient's family history was non contributory.  DATA REVIEWED, COUNSELING AND COORDINATION OF CARE: I have personally seen and evaluated the patient, evaluated laboratory and imaging results, formulated the assessment and plan and placed orders. This requires moderate/high medical decision making. Total time spent with patient and charting: 60 minutes    Allergies (April Staton, CMA; 02/09/2020 2:50 PM) Levofloxacin *CHEMICALS*  Tetracycline *CHEMICALS*  Ciprofloxacin *CHEMICALS*  Flonase *NASAL AGENTS - SYSTEMIC AND TOPICAL*  Nitrofurantoin Monohyd Macro *ANTI-INFECTIVE AGENTS - MISC.*  Scopolamine *ANTIEMETICS*  Allergies Reconciled   Medication History (April Staton, CMA; 02/09/2020 2:50 PM) Atrovent (0.03% Solution, Nasal) Active. Synthroid (137MCG Tablet, Oral) Active. Ketoconazole (2% Cream, External) Active. Medications Reconciled  Vitals (April Staton CMA; 02/09/2020 2:51 PM) 02/09/2020 2:50 PM Weight: 242.38 lb Height: 65in Body Surface Area: 2.15 m Body Mass Index: 40.33 kg/m  Temp.: 97.34F (Temporal)  Pulse: 99 (Regular)  P.OX: 94% (Room air) BP: 140/84(Sitting, Left Arm, Standard)  Physical Exam  General: Obese WN WF who is alert and generally healthy appearing. HEENT: Normal. Pupils equal.  Neck: Supple. No mass. No thyroid mass.  Lymph Nodes: No supraclavicular, cervical or axillary nodes. She has a palpable right inguinal node that measures about 2 cm. I did an [place] to confirm that this looks like an abnormal lymph node.  Lungs: Clear to auscultation and symmetric breath sounds. Heart: RRR. No murmur or rub.  Breast: Right - scars from prior lumpectomies in [facility]. She has a scar that feels consistent with fat necrosis. She had a biopsy on 01/23/2020 that showed fat necrosis.  Abdomen: Soft.  No mass. No tenderness. No hernia. Normal bowel sounds. Scarred umbilicus from lap appendectomy. Rectal: Not done.  Extremities: Good strength and ROM in upper and lower extremities.  Neurologic: Grossly intact to motor and sensory function. Psychiatric: Has normal mood and affect. Behavior is normal.  Assessment & Plan  1.  LYMPHADENOPATHY, INGUINAL (R59.0)  Plan:  1. Right inguinal node excision  2.  MORBID OBESITY (E66.01) 3.  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 Emphysema on CT scan 6 Remote history of kidney stone - 1972  Alphonsa Overall, MD, [facility] Surgery Office phone:  [phone]

## 2020-02-23 ENCOUNTER — Encounter (HOSPITAL_BASED_OUTPATIENT_CLINIC_OR_DEPARTMENT_OTHER)
Admission: RE | Disposition: A | Discharge status: Home / Self Care | Payer: Self-pay | Source: Home / Self Care | Attending: Surgery

## 2020-02-23 ENCOUNTER — Ambulatory Visit (HOSPITAL_BASED_OUTPATIENT_CLINIC_OR_DEPARTMENT_OTHER): Payer: Medicare Other | Admitting: Anesthesiology

## 2020-02-23 ENCOUNTER — Ambulatory Visit (HOSPITAL_BASED_OUTPATIENT_CLINIC_OR_DEPARTMENT_OTHER)
Admission: RE | Admit: 2020-02-23 | Discharge: 2020-02-23 | Disposition: A | Discharge status: Home / Self Care | Payer: Medicare Other | Attending: Surgery | Admitting: Surgery

## 2020-02-23 ENCOUNTER — Encounter (HOSPITAL_BASED_OUTPATIENT_CLINIC_OR_DEPARTMENT_OTHER): Payer: Self-pay | Admitting: Surgery

## 2020-02-23 ENCOUNTER — Other Ambulatory Visit: Payer: Self-pay

## 2020-02-23 DIAGNOSIS — Z923 Personal history of irradiation: Secondary | ICD-10-CM | POA: Insufficient documentation

## 2020-02-23 DIAGNOSIS — Z87891 Personal history of nicotine dependence: Secondary | ICD-10-CM | POA: Insufficient documentation

## 2020-02-23 DIAGNOSIS — E039 Hypothyroidism, unspecified: Secondary | ICD-10-CM | POA: Diagnosis not present

## 2020-02-23 DIAGNOSIS — Z170 Estrogen receptor positive status [ER+]: Secondary | ICD-10-CM | POA: Diagnosis not present

## 2020-02-23 DIAGNOSIS — R599 Enlarged lymph nodes, unspecified: Secondary | ICD-10-CM | POA: Diagnosis not present

## 2020-02-23 DIAGNOSIS — Z9049 Acquired absence of other specified parts of digestive tract: Secondary | ICD-10-CM | POA: Insufficient documentation

## 2020-02-23 DIAGNOSIS — J439 Emphysema, unspecified: Secondary | ICD-10-CM | POA: Diagnosis not present

## 2020-02-23 DIAGNOSIS — Z6839 Body mass index (BMI) 39.0-39.9, adult: Secondary | ICD-10-CM | POA: Diagnosis not present

## 2020-02-23 DIAGNOSIS — C50411 Malignant neoplasm of upper-outer quadrant of right female breast: Secondary | ICD-10-CM | POA: Diagnosis not present

## 2020-02-23 DIAGNOSIS — R590 Localized enlarged lymph nodes: Secondary | ICD-10-CM | POA: Insufficient documentation

## 2020-02-23 DIAGNOSIS — Z853 Personal history of malignant neoplasm of breast: Secondary | ICD-10-CM | POA: Insufficient documentation

## 2020-02-23 HISTORY — PX: LYMPH NODE DISSECTION: SHX5087

## 2020-02-23 SURGERY — LYMPH NODE DISSECTION
Anesthesia: General | Site: Groin | Laterality: Right

## 2020-02-23 MED ORDER — OXYCODONE HCL 5 MG/5ML PO SOLN: 5.0000 mg | Freq: Once | ORAL | Status: DC | PRN

## 2020-02-23 MED ORDER — PROPOFOL 10 MG/ML IV BOLUS
INTRAVENOUS | Status: AC
  Filled 2020-02-23: qty 20

## 2020-02-23 MED ORDER — OXYCODONE HCL 5 MG PO TABS: 5.0000 mg | ORAL_TABLET | Freq: Once | ORAL | Status: DC | PRN

## 2020-02-23 MED ORDER — ONDANSETRON HCL 4 MG/2ML IJ SOLN
INTRAMUSCULAR | Status: DC | PRN
  Administered 2020-02-23: 4 mg via INTRAVENOUS

## 2020-02-23 MED ORDER — CHLORHEXIDINE GLUCONATE CLOTH 2 % EX PADS: 6.0000 | MEDICATED_PAD | Freq: Once | CUTANEOUS | Status: DC

## 2020-02-23 MED ORDER — LACTATED RINGERS IV SOLN: INTRAVENOUS | Status: DC

## 2020-02-23 MED ORDER — FENTANYL CITRATE (PF) 100 MCG/2ML IJ SOLN
25.0000 ug | INTRAMUSCULAR | Status: DC | PRN
  Administered 2020-02-23: 50 ug via INTRAVENOUS

## 2020-02-23 MED ORDER — METOCLOPRAMIDE HCL 5 MG/ML IJ SOLN
10.0000 mg | Freq: Once | INTRAMUSCULAR | Status: AC
  Administered 2020-02-23: 10 mg via INTRAVENOUS

## 2020-02-23 MED ORDER — FENTANYL CITRATE (PF) 100 MCG/2ML IJ SOLN
INTRAMUSCULAR | Status: DC | PRN
Start: 2020-02-23 — End: 2020-02-23
  Administered 2020-02-23: 28 ug via INTRAVENOUS
  Administered 2020-02-23 (×2): 25 ug via INTRAVENOUS

## 2020-02-23 MED ORDER — CEFAZOLIN SODIUM-DEXTROSE 2-4 GM/100ML-% IV SOLN
INTRAVENOUS | Status: AC
  Filled 2020-02-23: qty 100

## 2020-02-23 MED ORDER — FENTANYL CITRATE (PF) 100 MCG/2ML IJ SOLN
INTRAMUSCULAR | Status: AC
  Filled 2020-02-23: qty 2

## 2020-02-23 MED ORDER — PROMETHAZINE HCL 25 MG/ML IJ SOLN
INTRAMUSCULAR | Status: AC
  Filled 2020-02-23: qty 1

## 2020-02-23 MED ORDER — MEPERIDINE HCL 25 MG/ML IJ SOLN: 6.2500 mg | INTRAMUSCULAR | Status: DC | PRN

## 2020-02-23 MED ORDER — ACETAMINOPHEN 500 MG PO TABS
ORAL_TABLET | ORAL | Status: AC
  Filled 2020-02-23: qty 2

## 2020-02-23 MED ORDER — PROMETHAZINE HCL 25 MG/ML IJ SOLN
12.5000 mg | Freq: Once | INTRAMUSCULAR | Status: AC
  Administered 2020-02-23: 12.5 mg via INTRAVENOUS

## 2020-02-23 MED ORDER — EPHEDRINE SULFATE 50 MG/ML IJ SOLN
INTRAMUSCULAR | Status: DC | PRN
  Administered 2020-02-23 (×2): 20 mg via INTRAVENOUS

## 2020-02-23 MED ORDER — ONDANSETRON HCL 4 MG/2ML IJ SOLN: 4.0000 mg | Freq: Once | INTRAMUSCULAR | Status: DC | PRN

## 2020-02-23 MED ORDER — ACETAMINOPHEN 325 MG PO TABS: 325.0000 mg | ORAL_TABLET | ORAL | Status: DC | PRN

## 2020-02-23 MED ORDER — METOCLOPRAMIDE HCL 5 MG/ML IJ SOLN
INTRAMUSCULAR | Status: AC
  Filled 2020-02-23: qty 2

## 2020-02-23 MED ORDER — BUPIVACAINE HCL (PF) 0.25 % IJ SOLN
INTRAMUSCULAR | Status: DC | PRN
  Administered 2020-02-23: 20 mL

## 2020-02-23 MED ORDER — ACETAMINOPHEN 160 MG/5ML PO SOLN: 325.0000 mg | ORAL | Status: DC | PRN

## 2020-02-23 MED ORDER — LIDOCAINE HCL (CARDIAC) PF 100 MG/5ML IV SOSY
PREFILLED_SYRINGE | INTRAVENOUS | Status: DC | PRN
  Administered 2020-02-23: 60 mg via INTRAVENOUS

## 2020-02-23 MED ORDER — ACETAMINOPHEN 500 MG PO TABS
1000.0000 mg | ORAL_TABLET | ORAL | Status: AC
  Administered 2020-02-23: 1000 mg via ORAL

## 2020-02-23 MED ORDER — CEFAZOLIN SODIUM-DEXTROSE 2-4 GM/100ML-% IV SOLN: 2.0000 g | INTRAVENOUS | Status: DC

## 2020-02-23 MED ORDER — PROPOFOL 10 MG/ML IV BOLUS
INTRAVENOUS | Status: DC | PRN
  Administered 2020-02-23: 150 ug/kg/min via INTRAVENOUS
  Administered 2020-02-23: 150 mg via INTRAVENOUS

## 2020-02-23 MED ORDER — TRAMADOL HCL 50 MG PO TABS: 50.0000 mg | ORAL_TABLET | Freq: Four times a day (QID) | ORAL | 0 refills | Status: DC | PRN

## 2020-02-23 SURGICAL SUPPLY — 49 items
ADH SKN CLS APL DERMABOND .7 (GAUZE/BANDAGES/DRESSINGS)
APL PRP STRL LF DISP 70% ISPRP (MISCELLANEOUS) ×1
APL SKNCLS STERI-STRIP NONHPOA (GAUZE/BANDAGES/DRESSINGS)
BENZOIN TINCTURE PRP APPL 2/3 (GAUZE/BANDAGES/DRESSINGS) IMPLANT
BLADE HEX COATED 2.75 (ELECTRODE) IMPLANT
BLADE SURG 10 STRL SS (BLADE) ×3 IMPLANT
BLADE SURG 15 STRL LF DISP TIS (BLADE) ×1 IMPLANT
BLADE SURG 15 STRL SS (BLADE) ×3
CHLORAPREP W/TINT 26 (MISCELLANEOUS) ×3 IMPLANT
CLOSURE WOUND 1/2 X4 (GAUZE/BANDAGES/DRESSINGS)
COVER BACK TABLE 60X90IN (DRAPES) ×3 IMPLANT
COVER MAYO STAND STRL (DRAPES) ×3 IMPLANT
COVER WAND RF STERILE (DRAPES) IMPLANT
DECANTER SPIKE VIAL GLASS SM (MISCELLANEOUS) IMPLANT
DERMABOND ADVANCED (GAUZE/BANDAGES/DRESSINGS)
DERMABOND ADVANCED .7 DNX12 (GAUZE/BANDAGES/DRESSINGS) IMPLANT
DRAPE LAPAROTOMY 100X72 PEDS (DRAPES) ×3 IMPLANT
DRAPE UTILITY XL STRL (DRAPES) ×3 IMPLANT
ELECT COATED BLADE 2.86 ST (ELECTRODE) IMPLANT
ELECT REM PT RETURN 9FT ADLT (ELECTROSURGICAL) ×3
ELECTRODE REM PT RTRN 9FT ADLT (ELECTROSURGICAL) ×1 IMPLANT
GAUZE SPONGE 4X4 12PLY STRL LF (GAUZE/BANDAGES/DRESSINGS) ×3 IMPLANT
GLOVE SURG SYN 7.5  E (GLOVE) ×3
GLOVE SURG SYN 7.5 E (GLOVE) ×1 IMPLANT
GOWN STRL REUS W/ TWL LRG LVL3 (GOWN DISPOSABLE) ×2 IMPLANT
GOWN STRL REUS W/ TWL XL LVL3 (GOWN DISPOSABLE) ×1 IMPLANT
GOWN STRL REUS W/TWL LRG LVL3 (GOWN DISPOSABLE) ×6
GOWN STRL REUS W/TWL XL LVL3 (GOWN DISPOSABLE) ×3
NEEDLE HYPO 25X1 1.5 SAFETY (NEEDLE) ×3 IMPLANT
NS IRRIG 1000ML POUR BTL (IV SOLUTION) ×3 IMPLANT
PACK BASIN DAY SURGERY FS (CUSTOM PROCEDURE TRAY) ×3 IMPLANT
PENCIL SMOKE EVACUATOR (MISCELLANEOUS) ×3 IMPLANT
SLEEVE SCD COMPRESS KNEE MED (MISCELLANEOUS) IMPLANT
SPONGE LAP 18X18 RF (DISPOSABLE) IMPLANT
STRIP CLOSURE SKIN 1/2X4 (GAUZE/BANDAGES/DRESSINGS) IMPLANT
SUT ETHILON 2 0 FS 18 (SUTURE) IMPLANT
SUT ETHILON 5 0 P 3 18 (SUTURE)
SUT MNCRL AB 4-0 PS2 18 (SUTURE) ×3 IMPLANT
SUT NYLON ETHILON 5-0 P-3 1X18 (SUTURE) IMPLANT
SUT VIC AB 5-0 P-3 18X BRD (SUTURE) IMPLANT
SUT VIC AB 5-0 P3 18 (SUTURE)
SUT VICRYL 3-0 CR8 SH (SUTURE) ×3 IMPLANT
SUT VICRYL 4-0 PS2 18IN ABS (SUTURE) IMPLANT
SYR BULB EAR ULCER 3OZ GRN STR (SYRINGE) ×3 IMPLANT
SYR CONTROL 10ML LL (SYRINGE) ×3 IMPLANT
TOWEL GREEN STERILE FF (TOWEL DISPOSABLE) ×3 IMPLANT
TUBE CONNECTING 20'X1/4 (TUBING)
TUBE CONNECTING 20X1/4 (TUBING) IMPLANT
YANKAUER SUCT BULB TIP NO VENT (SUCTIONS) IMPLANT

## 2020-02-23 NOTE — Discharge Instructions (Signed)
[facility] - DISCHARGE INSTRUCTIONS TO PATIENT  Activity:  Driving - May drive in one to 3 days, if doing well                       Practice your Covid-19 protection:  Wear a mask, social distance, and wash your hands frequently  Wound Care:   Leave dressing on the incision and the incision dry for 2 days, then you may shower  Diet:  As tolerated  Follow up appointment:  Call Dr. Pollie Friar office [facility]) at [phone] for an appointment in 2 to 4 weeks..  Medications and dosages:  Resume your home medications.  You have a prescription for:  Ultram             You may also take Tylenol, ibuprofen, or Aleve for pain  Call Dr. Lucia Gaskins or his office  [phone]) if you have:  Temperature greater than 100.4,  Persistent nausea and vomiting,  Severe uncontrolled pain,  Redness, tenderness, or signs of infection (pain, swelling, redness, odor or green/yellow discharge around the site),  Difficulty breathing, headache or visual disturbances,  Any other questions or concerns you may have after discharge.  In an emergency, call 911 or go to an Emergency Department at a nearby hospital.    Post Anesthesia Home Care Instructions  Activity: Get plenty of rest for the remainder of the day. A responsible individual must stay with you for 24 hours following the procedure.  For the next 24 hours, DO NOT: -Drive a car -Paediatric nurse -Drink alcoholic beverages -Take any medication unless instructed by your physician -Make any legal decisions or sign important papers.  Meals: Start with liquid foods such as gelatin or soup. Progress to regular foods as tolerated. Avoid greasy, spicy, heavy foods. If nausea and/or vomiting occur, drink only clear liquids until the nausea and/or vomiting subsides. Call your physician if vomiting continues.  Special Instructions/Symptoms: Your throat may feel dry or sore from the anesthesia or the breathing tube  placed in your throat during surgery. If this causes discomfort, gargle with warm salt water. The discomfort should disappear within 24 hours.  If you had a scopolamine patch placed behind your ear for the management of post- operative nausea and/or vomiting:  1. The medication in the patch is effective for 72 hours, after which it should be removed.  Wrap patch in a tissue and discard in the trash. Wash hands thoroughly with soap and water. 2. You may remove the patch earlier than 72 hours if you experience unpleasant side effects which may include dry mouth, dizziness or visual disturbances. 3. Avoid touching the patch. Wash your hands with soap and water after contact with the patch.

## 2020-02-23 NOTE — Op Note (Signed)
02/23/2020  8:19 AM  PATIENT:  Elizabeth Ramirez, 74 y.o., female, MRN: [id number]  PREOP DIAGNOSIS:  right inguinal lymphadenopathy  POSTOP DIAGNOSIS:   Right inguinal adenopathy  PROCEDURE:   Procedure(s):  right inguinal lymph node excision  SURGEON:   Alphonsa Overall, M.D.  ASSISTANT:   None  ANESTHESIA:   general  Anesthesiologist: Janeece Riggers, MD CRNA: Blocker, Ernesta Amble, CRNA  General  EBL:  minimal  ml  BLOOD ADMINISTERED: none  DRAINS: none   LOCAL MEDICATIONS USED:   25 cc 1/4% marcaine  SPECIMEN:   Right inguinal lymph node  COUNTS CORRECT:  YES  INDICATIONS FOR PROCEDURE:  Elizabeth Ramirez is a 74 y.o. (DOB: May 10, 1946) white female whose primary care physician is Cari Caraway, MD and comes for right inguinal lymph node biopsy.     She is a patient of Dr. [name] and has been found to have adenopathy on a PET scan.  She has a palpable right inguinal lymph node.   The indications and risks of the surgery were explained to the patient.  The risks include, but are not limited to, infection, bleeding, and nerve injury.  PROCEDURE: The patient was taken to room #5 at the [facility] surgery.  She underwent a general anesthetic.  I used an ultrasound to confirm that the mass I felt was the suspicious lymph node.  Her right groin was prepped with ChloraPrep and sterilely draped.  A timeout was held and the surgical checklist run.  I made a transverse incision directly over the palpable right inguinal lymph node.  I dissected down to the lymph node.  The lymph node lay immediately lateral to the greater saphenous vein. It was about 2 cm x 3 cm in size.   I excised the lymph node and sent it to pathology for lymphoma evaluation.  Hemostasis was controlled with Bovie electrocautery and 3-0 Vicryl sutures.  I infiltrated the subcutaneous tissues and skin with 25 cc of quarter percent Marcaine.  The wound was closed in layers with 3-0 Vicryl sutures for the deep layers and  4-0 Monocryl suture for the skin.  The wound was painted with Dermabond and sterilely dressed.  She was transferred to recovery room in good condition.  Alphonsa Overall, MD, [facility] Surgery Office phone:  [phone]

## 2020-02-23 NOTE — Anesthesia Postprocedure Evaluation (Signed)
Anesthesia Post Note  Patient: Elizabeth Ramirez  Procedure(s) Performed: right inguinal lymph node excision (Right Groin)     Patient location during evaluation: PACU Anesthesia Type: General Level of consciousness: awake and alert Pain management: pain level controlled Vital Signs Assessment: post-procedure vital signs reviewed and stable Respiratory status: spontaneous breathing, nonlabored ventilation, respiratory function stable and patient connected to nasal cannula oxygen Cardiovascular status: blood pressure returned to baseline and stable Postop Assessment: no apparent nausea or vomiting Anesthetic complications: no   No complications documented.  Last Vitals:  Vitals:   02/23/20 0915 02/23/20 0938  BP: 121/73 119/68  Pulse: 98 89  Resp: 11 18  Temp:  36.4 C  SpO2: 99% 97%    Last Pain:  Vitals:   02/23/20 0938  TempSrc:   PainSc: 5                  Revella Shelton

## 2020-02-23 NOTE — Transfer of Care (Signed)
Immediate Anesthesia Transfer of Care Note  Patient: Elizabeth Ramirez  Procedure(s) Performed: right inguinal lymph node excision (Right Groin)  Patient Location: PACU  Anesthesia Type:General  Level of Consciousness: awake, alert , oriented and patient cooperative  Airway & Oxygen Therapy: Patient Spontanous Breathing and Patient connected to face mask oxygen  Post-op Assessment: Report given to RN and Post -op Vital signs reviewed and stable  Post vital signs: Reviewed and stable  Last Vitals:  Vitals Value Taken Time  BP    Temp    Pulse 106 02/23/20 0826  Resp 20 02/23/20 0826  SpO2 100 % 02/23/20 0826  Vitals shown include unvalidated device data.  Last Pain:  Vitals:   02/23/20 0644  TempSrc: Oral  PainSc: 0-No pain      Patients Stated Pain Goal: 5 (27/03/50 0938)  Complications: No complications documented.

## 2020-02-23 NOTE — Interval H&P Note (Signed)
History and Physical Interval Note:  02/23/2020 7:26 AM  Elizabeth Ramirez  has presented today for surgery, with the diagnosis of right inguinal lymphadenopathy.  The various methods of treatment have been discussed with the patient and family. Her daughter is here with her. After consideration of risks, benefits and other options for treatment, the patient has consented to  Procedure(s): right inguinal lymph node excision (Right) as a surgical intervention.  The patient's history has been reviewed, patient examined, no change in status, stable for surgery.  I have reviewed the patient's chart and labs.  Questions were answered to the patient's satisfaction.     [name]

## 2020-02-23 NOTE — Anesthesia Procedure Notes (Signed)
Procedure Name: LMA Insertion Date/Time: 02/23/2020 7:38 AM Performed by: Signe Colt, CRNA Pre-anesthesia Checklist: Patient identified, Emergency Drugs available, Suction available and Patient being monitored Patient Re-evaluated:Patient Re-evaluated prior to induction Oxygen Delivery Method: Circle system utilized Preoxygenation: Pre-oxygenation with 100% oxygen Induction Type: IV induction Ventilation: Mask ventilation without difficulty LMA: LMA inserted LMA Size: 4.0 Number of attempts: 1 Airway Equipment and Method: Bite block Placement Confirmation: positive ETCO2 Tube secured with: Tape Dental Injury: Teeth and Oropharynx as per pre-operative assessment

## 2020-02-26 ENCOUNTER — Other Ambulatory Visit: Payer: Self-pay | Admitting: Oncology

## 2020-02-26 ENCOUNTER — Encounter (HOSPITAL_BASED_OUTPATIENT_CLINIC_OR_DEPARTMENT_OTHER): Payer: Self-pay | Admitting: Surgery

## 2020-02-27 ENCOUNTER — Other Ambulatory Visit: Payer: Self-pay

## 2020-02-28 ENCOUNTER — Encounter: Payer: Self-pay | Admitting: Oncology

## 2020-02-28 ENCOUNTER — Other Ambulatory Visit: Payer: Self-pay | Admitting: Oncology

## 2020-02-28 LAB — SURGICAL PATHOLOGY

## 2020-02-29 ENCOUNTER — Other Ambulatory Visit: Payer: Self-pay | Admitting: Oncology

## 2020-02-29 DIAGNOSIS — R591 Generalized enlarged lymph nodes: Secondary | ICD-10-CM

## 2020-02-29 DIAGNOSIS — Z170 Estrogen receptor positive status [ER+]: Secondary | ICD-10-CM

## 2020-03-01 ENCOUNTER — Inpatient Hospital Stay: Payer: Medicare Other | Attending: Oncology | Admitting: Oncology

## 2020-03-01 ENCOUNTER — Telehealth: Payer: Self-pay | Admitting: Oncology

## 2020-03-01 ENCOUNTER — Encounter: Payer: Self-pay | Admitting: Oncology

## 2020-03-01 ENCOUNTER — Other Ambulatory Visit: Payer: Self-pay

## 2020-03-01 ENCOUNTER — Inpatient Hospital Stay: Payer: Medicare Other

## 2020-03-01 VITALS — BP 154/89 | HR 82 | Temp 97.4°F | Resp 18 | Ht 65.0 in | Wt 237.8 lb

## 2020-03-01 DIAGNOSIS — Z803 Family history of malignant neoplasm of breast: Secondary | ICD-10-CM | POA: Diagnosis not present

## 2020-03-01 DIAGNOSIS — Z96612 Presence of left artificial shoulder joint: Secondary | ICD-10-CM | POA: Insufficient documentation

## 2020-03-01 DIAGNOSIS — Z8349 Family history of other endocrine, nutritional and metabolic diseases: Secondary | ICD-10-CM | POA: Insufficient documentation

## 2020-03-01 DIAGNOSIS — Z9049 Acquired absence of other specified parts of digestive tract: Secondary | ICD-10-CM | POA: Insufficient documentation

## 2020-03-01 DIAGNOSIS — Z87442 Personal history of urinary calculi: Secondary | ICD-10-CM | POA: Insufficient documentation

## 2020-03-01 DIAGNOSIS — Z8041 Family history of malignant neoplasm of ovary: Secondary | ICD-10-CM | POA: Insufficient documentation

## 2020-03-01 DIAGNOSIS — Z801 Family history of malignant neoplasm of trachea, bronchus and lung: Secondary | ICD-10-CM | POA: Diagnosis not present

## 2020-03-01 DIAGNOSIS — Z807 Family history of other malignant neoplasms of lymphoid, hematopoietic and related tissues: Secondary | ICD-10-CM | POA: Insufficient documentation

## 2020-03-01 DIAGNOSIS — E039 Hypothyroidism, unspecified: Secondary | ICD-10-CM | POA: Diagnosis not present

## 2020-03-01 DIAGNOSIS — Z8249 Family history of ischemic heart disease and other diseases of the circulatory system: Secondary | ICD-10-CM | POA: Insufficient documentation

## 2020-03-01 DIAGNOSIS — Z833 Family history of diabetes mellitus: Secondary | ICD-10-CM | POA: Insufficient documentation

## 2020-03-01 DIAGNOSIS — Z170 Estrogen receptor positive status [ER+]: Secondary | ICD-10-CM

## 2020-03-01 DIAGNOSIS — R591 Generalized enlarged lymph nodes: Secondary | ICD-10-CM

## 2020-03-01 DIAGNOSIS — D869 Sarcoidosis, unspecified: Secondary | ICD-10-CM

## 2020-03-01 DIAGNOSIS — Z86000 Personal history of in-situ neoplasm of breast: Secondary | ICD-10-CM | POA: Insufficient documentation

## 2020-03-01 DIAGNOSIS — Z806 Family history of leukemia: Secondary | ICD-10-CM | POA: Diagnosis not present

## 2020-03-01 DIAGNOSIS — C50411 Malignant neoplasm of upper-outer quadrant of right female breast: Secondary | ICD-10-CM | POA: Diagnosis not present

## 2020-03-01 DIAGNOSIS — Z800 Family history of malignant neoplasm of digestive organs: Secondary | ICD-10-CM | POA: Diagnosis not present

## 2020-03-01 DIAGNOSIS — Z923 Personal history of irradiation: Secondary | ICD-10-CM | POA: Diagnosis not present

## 2020-03-01 DIAGNOSIS — Z87891 Personal history of nicotine dependence: Secondary | ICD-10-CM | POA: Diagnosis not present

## 2020-03-01 DIAGNOSIS — Z79899 Other long term (current) drug therapy: Secondary | ICD-10-CM | POA: Diagnosis not present

## 2020-03-01 LAB — CBC WITH DIFFERENTIAL/PLATELET
Abs Immature Granulocytes: 0.01 10*3/uL (ref 0.00–0.07)
Basophils Absolute: 0 10*3/uL (ref 0.0–0.1)
Basophils Relative: 0 %
Eosinophils Absolute: 0.3 10*3/uL (ref 0.0–0.5)
Eosinophils Relative: 7 %
HCT: 39.4 % (ref 36.0–46.0)
Hemoglobin: 13.5 g/dL (ref 12.0–15.0)
Immature Granulocytes: 0 %
Lymphocytes Relative: 24 %
Lymphs Abs: 1.1 10*3/uL (ref 0.7–4.0)
MCH: 30.2 pg (ref 26.0–34.0)
MCHC: 34.3 g/dL (ref 30.0–36.0)
MCV: 88.1 fL (ref 80.0–100.0)
Monocytes Absolute: 0.4 10*3/uL (ref 0.1–1.0)
Monocytes Relative: 9 %
Neutro Abs: 2.7 10*3/uL (ref 1.7–7.7)
Neutrophils Relative %: 60 %
Platelets: 166 10*3/uL (ref 150–400)
RBC: 4.47 MIL/uL (ref 3.87–5.11)
RDW: 13.4 % (ref 11.5–15.5)
WBC: 4.5 10*3/uL (ref 4.0–10.5)
nRBC: 0 % (ref 0.0–0.2)

## 2020-03-01 LAB — COMPREHENSIVE METABOLIC PANEL
ALT: 14 U/L (ref 0–44)
AST: 14 U/L — ABNORMAL LOW (ref 15–41)
Albumin: 3.9 g/dL (ref 3.5–5.0)
Alkaline Phosphatase: 80 U/L (ref 38–126)
Anion gap: 9 (ref 5–15)
BUN: 18 mg/dL (ref 8–23)
CO2: 26 mmol/L (ref 22–32)
Calcium: 9.6 mg/dL (ref 8.9–10.3)
Chloride: 107 mmol/L (ref 98–111)
Creatinine, Ser: 1.21 mg/dL — ABNORMAL HIGH (ref 0.44–1.00)
GFR calc Af Amer: 51 mL/min — ABNORMAL LOW (ref >60)
GFR calc non Af Amer: 44 mL/min — ABNORMAL LOW (ref >60)
Glucose, Bld: 91 mg/dL (ref 70–99)
Potassium: 4.4 mmol/L (ref 3.5–5.1)
Sodium: 142 mmol/L (ref 135–145)
Total Bilirubin: 1.1 mg/dL (ref 0.3–1.2)
Total Protein: 7 g/dL (ref 6.5–8.1)

## 2020-03-01 LAB — HIV ANTIBODY (ROUTINE TESTING W REFLEX): HIV Screen 4th Generation wRfx: NONREACTIVE

## 2020-03-01 LAB — C-REACTIVE PROTEIN: CRP: 1.1 mg/dL — ABNORMAL HIGH (ref <1.0)

## 2020-03-01 NOTE — Progress Notes (Signed)
[place]  [phone] [phone]     ID: Elizabeth Ramirez DOB: 09-18-1945  MR#: [id number]  DPO#:[id number]  Patient Care Team: Cari Caraway, MD as PCP - General (Family Medicine) Donatello Kleve, Virgie Dad, MD as Consulting Physician (Oncology) Excell Seltzer, MD (Inactive) as Consulting Physician (General Surgery) Bobbye Charleston, MD as Consulting Physician (Obstetrics and Gynecology) Justice Britain, MD as Consulting Physician (Orthopedic Surgery) OTHER MD:   CHIEF COMPLAINT:  Ductal carcinoma in situ  CURRENT TREATMENT: observation   INTERVAL HISTORY: Elizabeth Ramirez returns today for follow-up of her estrogen receptor positive breast cancer. She continues on observation.  She is accompanied by her friend Marine scientist.  Since her last visit, she underwent PET scan on 02/08/2020 showing: generalized nodal enlargement and/or hypermetabolic lymph nodes with splenic enlargement and increased metabolic activity, findings suggestive of lymphoma.  She proceeded to excision of a right inguinal lymph node on 02/23/2020. Pathology from the procedure [id number]) showed: lymph node with follicular hyperplasia, scattered giant cells, and epithelioid granulomas; no morphologic or immunophenotypic evidence of lymphoma.   REVIEW OF SYSTEMS: Elizabeth Ramirez continues to have absolutely no symptoms related to her adenopathy and in particular has been no unexplained weight loss, fatigue, drenching sweats, or fevers.  She just "doesn't feel ill".  In addition she has no cough phlegm production pleurisy or worsening shortness of breath.  A detailed review of systems was otherwise entirely stable   BREAST CANCER HISTORY: From the original intake note:  Elizabeth Ramirez underwent Right lumpectomy and sentinel lymph node biopsy April 2005 for 4 mm, grade 1 tubular tumor (T1a No, stage IA), in the setting of DCIS. This was estrogen and progesterone receptor positive, HER-2 not amplified. Margins were  ample. She participated in the MA-27 study and received anastrozole between June 2005 and June 2010, at which time she was released from follow-up.  More recently, Elizabeth Ramirez had screening bilateral mammography showing a suspicious area in the right breast and on 12/14/2014 underwent right diagnostic mammography at the breast Center. The breast density was category A. There was a group of pleomorphic calcifications in the upper central right breast spanning up to 2.8 cm. Biopsy of this area was obtained 12/21/2014 ([id number]) and showed ductal carcinoma in situ, grade 2 or 3, estrogen receptor 95% positive, and progesterone receptor 60% positive, both with strong staining intensity.  The patient's case was presented at the multidisciplinary breast cancer conference 01/02/2015. At that time a preliminary plan was proposed, namely genetics counseling and if no mutation noted breast MRI to be followed by lumpectomy, radiation, and anti-estrogens.  Her subsequent history is as detailed below.   PAST MEDICAL HISTORY: Past Medical History:  Diagnosis Date  . Allergy    tetracycline, levofloxacin, crabs & bay leaves  . Arthritis    Rt knee, Rt shoulder  . Breast cancer ([place]) 2016; 2005   Separate primaries; 2016 - DCIS of R breast(surgery and radiation-last 04-04-15); ER/PR+ April 2005  . Cancer ([place])   . Ear infection 10/19/2013  . Genetic testing 01/18/2015   Negative - no mutations found in any of 20 genes on the Breast/Ovarian Cancer Panel.  Additionally, no variants of uncertain significance (VUSs) were found.  The Breast/Ovarian gene panel offered by [facility] [name], MD) includes sequencing and deletion/duplication analysis for the following 19 genes:  ATM, BARD1, BRCA1, BRCA2, BRIP1, CDH1, CHEK2, FANCC, MLH1, MSH2, MSH6, NBN, PALB2, PMS2, PTEN, RAD51C, RAD51D, TP53, and XRCC2.  This panel also includes deletion/duplication analysis (without sequencing) for one gene,  EPCAM.  Date of  report is January 14, 2015.    Marland Kitchen Heart murmur   . History of kidney stones    x1 '71  . Hypothyroid 07/29/2011  . Hypothyroidism   . Malignant neoplasm of upper-outer quadrant of right breast in female, estrogen receptor positive ([place]) 01/14/2015  . OA (osteoarthritis) of knee 05/27/2015  . Obesity, morbid, BMI 40.0-49.9 ([place]) 12/31/2015  . Other chronic pain   . Personal history of radiation therapy   . PONV (postoperative nausea and vomiting)    extreme nausea and vomiting - cannot tolerate scopalamine   . 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 Seasonal allergies   . Thyroid disease   . Vitamin D deficiency     PAST SURGICAL HISTORY: Past Surgical History:  Procedure Laterality Date  . APPENDECTOMY  08/23/2006  . BREAST LUMPECTOMY Right 09/2003   DCIS  . BREAST LUMPECTOMY Right 2016  . BREAST LUMPECTOMY WITH NEEDLE LOCALIZATION Right 02/11/2015   Procedure: WIRE BRACKETED RIGHT BREAST LUMPECTOMY ;  Surgeon: Excell Seltzer, MD;  Location: [facility];  Service: General;  Laterality: Right;  . CATARACT EXTRACTION, BILATERAL  10-05-2017 left ; 11-09-2017 right   Dr Kandy Garrison   . colonoscopy with biopsy  12/28/2003   neg  . LYMPH NODE DISSECTION Right 02/23/2020   Procedure: right inguinal lymph node excision;  Surgeon: Alphonsa Overall, MD;  Location: [facility];  Service: General;  Laterality: Right;  . REVERSE SHOULDER ARTHROPLASTY Left 07/14/2018   Procedure: REVERSE SHOULDER ARTHROPLASTY;  Surgeon: Justice Britain, MD;  Location: [facility];  Service: Orthopedics;  Laterality: Left;  Interscalene Block  . TONSILLECTOMY     1954  . TOTAL KNEE ARTHROPLASTY Right 05/27/2015   Procedure: RIGHT TOTAL KNEE ARTHROPLASTY;  Surgeon: Gaynelle Arabian, MD;  Location: [facility];  Service: Orthopedics;  Laterality: Right;  . WISDOM TOOTH EXTRACTION Bilateral 1965    FAMILY  HISTORY Family History  Problem Relation Age of Onset  . Dementia Mother   . Other Mother 20       "spot on lung"; secondhand smoke exposure  . Pulmonary fibrosis Father   . Diabetes Brother   . Thyroid disease Brother   . Leukemia Brother 70       chronic lymphocytic   . Stomach cancer Maternal Grandmother        dx. 60s  . Heart attack Maternal Grandfather   . Diabetes Paternal Grandfather   . Stroke Paternal Grandfather   . Colitis Paternal Grandmother   . Parkinson's disease Maternal Aunt   . Diabetes Paternal Aunt   . Other Paternal Aunt        bowel obstruction  . Heart Problems Paternal Uncle   . Ovarian cancer Maternal Aunt        dx. 22s; paternal half sister of her mother  . Multiple myeloma Maternal Aunt   . Multiple myeloma Maternal Aunt 75  . Kidney failure Paternal Aunt   . Pulmonary fibrosis Paternal Aunt   . Stroke Paternal Aunt   . Heart Problems Paternal Aunt   . Emphysema Paternal Uncle   . Heart Problems Paternal Uncle   . Lung cancer Paternal Uncle        smoker  . Colon cancer Cousin   . Breast cancer Cousin        dx. 67s   [place] [facility] father died at the age of  84 from pulmonary fibrosis. Her mother died at age 72 with severe dementia. The patient had one brother no sisters. Her maternal grandmother died from stomach cancer the age of 59. The patient's mother had a half sister with ovarian cancer.   GYNECOLOGIC HISTORY:  No LMP recorded. Patient is postmenopausal.  menarche age 44, she isGX P0. Menopause 2003. Did not take hormone replacement   SOCIAL HISTORY:  Works as a Cabin crew, Publishing copy in renovations. She retired from [employer] as of 2018 and is now doing some Electronics engineer. At home it's her and Henriette Combs , who also is in real estate, with her own investment business.Katharine Look is a catholic, though not currently practicing    ADVANCED DIRECTIVES:  [name] is Chief Technology Officer of attorney   HEALTH MAINTENANCE: Social History    Tobacco Use  . Smoking status: Former Smoker    Packs/day: 1.00    Years: 9.00    Pack years: 9.00    Types: Cigarettes    Quit date: 06/16/1983    Years since quitting: 36.7  . Smokeless tobacco: Never Used  Substance Use Topics  . Alcohol use: Yes    Alcohol/week: 2.0 standard drinks    Types: 2 Glasses of wine per week    Comment: one glass w/ dinner  . Drug use: No     Colonoscopy:  20/15, [place] she knee  PAP: up-to-date/[facility]  Bone density: October 2016; normal  Lipid panel:  Allergies  Allergen Reactions  . Levofloxacin Other (See Comments)    Affected tendons, severe pain and swelling  . Tetracyclines & Related Swelling    tongue swelling and peeling  . Other Diarrhea and Rash    Crabs - diarrhea. Bay leaves - rash in her mouth  . Ciprofloxacin     Tendon problems   . Flonase [Fluticasone]     Nasal spray cause nose bleeds  . Ibuprofen     Gets mouth ulcers if she takes for more than 3 days; Aleve okay   . Nitrofurantoin Monohyd Macro Hives  . Scopolamine     "Headache and redness of eyes"    Current Outpatient Medications  Medication Sig Dispense Refill  . Calcium Carbonate-Vitamin D (CALTRATE 600+D PO) Take by mouth.    Marland Kitchen ipratropium (ATROVENT) 0.03 % nasal spray Place 1 spray into both nostrils daily as needed (seasonal allergies.).     Marland Kitchen levothyroxine (SYNTHROID, LEVOTHROID) 137 MCG tablet Take 137 mcg by mouth daily before breakfast.    . traMADol (ULTRAM) 50 MG tablet Take 1 tablet (50 mg total) by mouth every 6 (six) hours as needed for moderate pain or severe pain. 12 tablet 0   No current facility-administered medications for this visit.    OBJECTIVE: White woman in no acute distress  Vitals:   03/01/20 1530  BP: (!) 154/89  Pulse: 82  Resp: 18  Temp: (!) 97.4 F (36.3 C)  SpO2: 98%     Body mass index is 39.57 kg/m.    ECOG FS:1 - Symptomatic but completely ambulatory  Sclerae unicteric, EOMs intact Wearing a mask No cervical or  supraclavicular adenopathy Lungs no rales or rhonchi Heart regular rate and rhythm Abd soft, nontender, positive bowel sounds MSK no focal spinal tenderness Neuro: nonfocal, well oriented, appropriate affect Breasts: I do not palpate any axillary adenopathy  RESULTS:  CMP     Component Value Date/Time   NA 142 03/01/2020 1447   K 4.4 03/01/2020 1447   CL 107 03/01/2020  1447   CO2 26 03/01/2020 1447   GLUCOSE 91 03/01/2020 1447   BUN 18 03/01/2020 1447   CREATININE 1.21 (H) 03/01/2020 1447   CREATININE 1.28 (H) 01/26/2020 1220   CALCIUM 9.6 03/01/2020 1447   PROT 7.0 03/01/2020 1447   ALBUMIN 3.9 03/01/2020 1447   AST 14 (L) 03/01/2020 1447   AST 14 (L) 01/26/2020 1220   ALT 14 03/01/2020 1447   ALT 15 01/26/2020 1220   ALKPHOS 80 03/01/2020 1447   BILITOT 1.1 03/01/2020 1447   BILITOT 1.0 01/26/2020 1220   GFRNONAA 44 (L) 03/01/2020 1447   GFRNONAA 41 (L) 01/26/2020 1220   GFRAA 51 (L) 03/01/2020 1447   GFRAA 47 (L) 01/26/2020 1220    INo results found for: SPEP, UPEP  Lab Results  Component Value Date   WBC 4.5 03/01/2020   NEUTROABS 2.7 03/01/2020   HGB 13.5 03/01/2020   HCT 39.4 03/01/2020   MCV 88.1 03/01/2020   PLT 166 03/01/2020      Chemistry      Component Value Date/Time   NA 142 03/01/2020 1447   K 4.4 03/01/2020 1447   CL 107 03/01/2020 1447   CO2 26 03/01/2020 1447   BUN 18 03/01/2020 1447   CREATININE 1.21 (H) 03/01/2020 1447   CREATININE 1.28 (H) 01/26/2020 1220      Component Value Date/Time   CALCIUM 9.6 03/01/2020 1447   ALKPHOS 80 03/01/2020 1447   AST 14 (L) 03/01/2020 1447   AST 14 (L) 01/26/2020 1220   ALT 14 03/01/2020 1447   ALT 15 01/26/2020 1220   BILITOT 1.1 03/01/2020 1447   BILITOT 1.0 01/26/2020 1220       Lab Results  Component Value Date   LABCA2 22 12/04/2008    No components found for: [id number]  No results for input(s): INR in the last 168 hours.  Urinalysis    Component Value Date/Time   COLORURINE  YELLOW 05/20/2015 1120   APPEARANCEUR CLEAR 05/20/2015 1120   LABSPEC 1.009 05/20/2015 1120   PHURINE 6.5 05/20/2015 1120   GLUCOSEU NEGATIVE 05/20/2015 1120   HGBUR NEGATIVE 05/20/2015 1120   BILIRUBINUR NEGATIVE 05/20/2015 1120   BILIRUBINUR neg 12/19/2014 1427   KETONESUR NEGATIVE 05/20/2015 1120   PROTEINUR NEGATIVE 05/20/2015 1120   UROBILINOGEN 0.2 12/19/2014 1427   NITRITE NEGATIVE 05/20/2015 1120   LEUKOCYTESUR NEGATIVE 05/20/2015 1120    STUDIES: NM PET Image Initial (PI) Skull Base To Thigh  Result Date: 02/08/2020 CLINICAL DATA:  Initial treatment strategy for pulmonary nodules in a patient with history of breast cancer. EXAM: NUCLEAR MEDICINE PET SKULL BASE TO THIGH TECHNIQUE: 13.22 mCi F-18 FDG was injected intravenously. Full-ring PET imaging was performed from the skull base to thigh after the radiotracer. CT data was obtained and used for attenuation correction and anatomic localization. Fasting blood glucose: 93 mg/dl COMPARISON:  Chest CT January 26, 2020 and chest CT of March 07, 2019 FINDINGS: Mediastinal blood pool activity: SUV max 3.1 Liver activity: SUV max NA NECK: Markedly hypermetabolic RIGHT level 1 lymph nodes (image 28, series 4) (SUVmax = 9.7) largest approximately 7 mm with an adjacent 5 mm rounded lymph node just anterior to the angle of the mandible on the RIGHT. RIGHT level 2 lymph node obscured by streak artifact and small intraparotid lesion also likely with increased metabolic activity but not well seen on CT images. (Image 22, series 4) less hypermetabolic than other lymph nodes described approximately 4.5 maximum SUV with a size of approximately  7 mm on image 23 of series 2 Incidental CT findings: Misregistration artifact with areas of nonspecific hypermetabolic changes in the posterior neck, LEFT neck in particular. No signs of asymmetric or pharyngeal uptake. CHEST: LEFT axillary lymph nodes with intense FDG uptake (image 56, series 4) (SUVmax = 7.6)  (image 56, series 4) 11 mm lymph node with other small lymph nodes showing slightly less FDG uptake tracking into the LEFT subpectoral region Diffuse, symmetric paraspinal activity without discrete lesion. Mildly increased FDG uptake in the RIGHT axilla without discrete corresponding CT abnormality (image 51, series 4) (SUVmax = 4.0) No mediastinal adenopathy with frankly hypermetabolic features. Area of concern in the superior segment of the LEFT lower lobe is unchanged accounting for the respiratory variation that is seen on today's examination. No new pulmonary nodules or consolidation. (Image 28, series 8) (SUVmax = 0.95) Incidental CT findings: Scattered atherosclerosis. Coronary artery calcifications. No pericardial effusion. Heart size is stable. Postoperative changes in the RIGHT breast with multiple surgical clips, CT appearance unchanged compared to previous imaging. Area associated with bandlike FDG uptake only slightly greater than mediastinal blood pool. Area was recently biopsied. ABDOMEN/PELVIS: Focal area of increased metabolic activity along the LEFT hepatic margin (image 87, series 4) (SUVmax = 5.7 no CT correlate for this area. Adjacent stomach does appear separate from this area of heterogeneity. Diffuse hepatic heterogeneity is noted. A second area which appears to be above the background hepatic heterogeneity is noted in the RIGHT hepatic lobe without CT correlate. These areas are only mildly of above the liver background which is approximately maximum SUV of 4. Background splenic activity is greater than that of the liver with a maximum SUV approximately 5.8 Retroperitoneal adenopathy (image 106, series 4) 8 mm lymph node with a maximum SUV of 9.1 (Image 117, series 4) 6 mm lymph node with maximum SUV of 7.8. Other scattered small nodes with similar or slightly less FDG uptake in the retroperitoneum. Bulky pelvic lymph nodes with marked hypermetabolic activity (image 161, series 4) 1.6 cm LEFT  pelvic sidewall lymph node (SUVmax = 14 Index node in the RIGHT groin with a maximum SUV of 12.7 on image 187 of series 4 18 mm short axis. Numerous other pelvic lymph nodes greater than 10 total nodes in the pelvis and inguinal regions with hypermetabolic features. Incidental CT findings: Liver with lobular hepatic contours and fissural widening. Pancreas is normal. Spleen enlarged as above. Adrenal glands are normal. No hydronephrosis. Urinary bladder under distended limiting assessment. No acute gastrointestinal process, colonic diverticulosis. Aortic atherosclerosis without aneurysmal dilation. Unremarkable appearance of the uterus and adnexa not well evaluated. SKELETON: No focal hypermetabolic activity to suggest skeletal metastasis. Incidental CT findings: Post LEFT shoulder arthroplasty. Glenohumeral degenerative changes and spinal degenerative changes. IMPRESSION: 1. Generalized nodal enlargement and or hypermetabolic lymph nodes with splenic enlargement and increased metabolic activity findings are most suggestive of lymphoproliferative disorder/lymphoma. RIGHT groin lymph node with considerable FDG uptake may be the best target for sampling. Would avoid LEFT axillary lymph nodes given that the patient reportedly has had recent COVID-19 vaccination which can be confounding on PET scan. Correlate with side of vaccine administration. Metastatic disease is also considered though would be unusual in the setting of breast cancer with pelvic node predominant disease and splenic enlargement with mild hypermetabolism. 2. Small lymph nodes in the neck and potential RIGHT parotid lesion, this could be related but would suggest dedicated assessment of the parotid gland for further evaluation, this could be performed with CT  or ultrasound as an initial means in further evaluating this area. 3. Small lung nodule in the LEFT lower lobe is nonspecific in terms of FDG uptake suggesting indolent process. Morphologic  features raising the question of bronchogenic neoplasm as outlined previously. 4. Mild increased FDG uptake in the RIGHT breast may relate to recent biopsy at the site of previous surgery, refer to biopsy results and mammography for further information. 5. Lobular hepatic contours could also be seen in the setting of liver disease, correlate with any clinical or laboratory evidence of liver disease. Liver disease could be an alternative explanation for splenic enlargement. 6. Activation of brown fat along the spine and potentially in the area of the occipital region and posterior neck. Electronically Signed   By: [name] M.D.   On: 02/08/2020 16:59    ASSESSMENT: 74 y.o. [place] woman  (1) status post right lumpectomy April 2005 for a pT1a pN0, stage IA invasive breast cancer, estrogen and progesterone receptor positive, HER-2 negative, in the setting of DCIS;   (a) did not receive radiation  (b) status post anastrozole for 5 years completed June 2010  (2) status post right breast upper outer quadrant biopsy 12/21/2014 for ductal carcinoma in situ, grade 2 or 3, estrogen and progesterone receptor positive.  (3) Status post right lumpectomy 02/11/2015 showing ductal carcinoma in situ, intermediate grade, measuring 0.9 cm, with close but negative margins  (4) adjuvant radiation: 03/07/2015-04/04/2015  Right breast/ 42.5 Gy at 2.5 Gy per fraction x 17 fractions.  Right breast boost/ 7.5 Gy at 2.5 Gy per fraction x 3 fractions  (5) genetic testing 01/14/2015 through the [place] Panel offered by [facility] [name], MD) showed no deleterious mutations in ATM, BARD1, BRCA1, BRCA2, BRIP1, CDH1, CHEK2, FANCC, MLH1, MSH2, MSH6, NBN, PALB2, PMS2, PTEN, RAD51C, RAD51D, TP53, and XRCC2. This panel also includes deletion/duplication analysis (without sequencing) for one gene, EPCAM.  (6) resumed anastrozole 07/06/2014, discontinued March 2017 with arthralgias  and myalgias developing   (a) started letrozole 10/14/2015--discontinued after a brief course secondary to side effects  (b) Bone density 03/29/2015 was normal, with the lowest T score at 0.4   PLAN: Elizabeth Ramirez is now just over 5 years out from definitive surgery for her breast cancer with no evidence of disease recurrence.  This is favorable.  We were preparing to discharge her from follow-up when she was found to have diffuse lymphadenopathy.  We obtained a lymph node biopsy which shows no evidence of breast cancer or lymphoma.  There are granulomas but no evidence of tuberculosis or other chronic infection.  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  We discussed our understanding of sarcoid which is of course very imperfect.  At this point I do not believe she requires any intervention but I do believe she will benefit from further evaluation to rule out any other possible causes of her lymphadenopathy and I am referring her to pulmonary with that in mind.  She tells me she had an appendectomy 2008 which resulted in some problems with her bellybutton.  She had an infection not that long ago in that area which she says was treated empirically.  She wonders if that could have anything to do with her current situation and I do not see a clear connection.  Incidentally PCR screen for MRSA and staph on 07/12/2018 were both negative  Pending other issues she will have repeat mammography August of next year and she will see me shortly after that.  Assuming all is in place she may be discharged from follow-up then.  Total encounter time 30 minutes.  Tanaisha Pittman, Virgie Dad, MD  03/01/20 8:14 PM Medical Oncology and Hematology [facility] [place], [place] [address] Tel. [phone]    Fax. [phone]    I, Wilburn Mylar, am acting as scribe for Dr. Virgie Dad. Jayveion Stalling.  I, Lurline Del MD, have reviewed the above documentation for accuracy and  completeness, and I agree with the above.   *Total Encounter Time as defined by the Centers for Medicare and Medicaid Services includes, in addition to the face-to-face time of a patient visit (documented in the note above) non-face-to-face time: obtaining and reviewing outside history, ordering and reviewing medications, tests or procedures, care coordination (communications with other health care professionals or caregivers) and documentation in the medical record.

## 2020-03-01 NOTE — Telephone Encounter (Signed)
Scheduled appts per 9/17 los. Gave pt a print out of AVS.  

## 2020-03-02 ENCOUNTER — Encounter: Payer: Self-pay | Admitting: Oncology

## 2020-03-02 LAB — IGG, IGA, IGM
IgA: 68 mg/dL (ref 64–422)
IgG (Immunoglobin G), Serum: 511 mg/dL — ABNORMAL LOW (ref 586–1602)
IgM (Immunoglobulin M), Srm: 220 mg/dL — ABNORMAL HIGH (ref 26–217)

## 2020-03-03 LAB — ANGIOTENSIN CONVERTING ENZYME: Angiotensin-Converting Enzyme: 56 U/L (ref 14–82)

## 2020-03-11 DIAGNOSIS — M25562 Pain in left knee: Secondary | ICD-10-CM | POA: Diagnosis not present

## 2020-03-11 DIAGNOSIS — M1712 Unilateral primary osteoarthritis, left knee: Secondary | ICD-10-CM | POA: Diagnosis not present

## 2020-03-12 DIAGNOSIS — Z23 Encounter for immunization: Secondary | ICD-10-CM | POA: Diagnosis not present

## 2020-03-17 DIAGNOSIS — Z23 Encounter for immunization: Secondary | ICD-10-CM | POA: Diagnosis not present

## 2020-03-19 ENCOUNTER — Other Ambulatory Visit (HOSPITAL_COMMUNITY): Payer: Self-pay | Admitting: Surgery

## 2020-03-19 DIAGNOSIS — R2241 Localized swelling, mass and lump, right lower limb: Secondary | ICD-10-CM

## 2020-03-20 ENCOUNTER — Ambulatory Visit (HOSPITAL_COMMUNITY)
Admission: RE | Admit: 2020-03-20 | Discharge: 2020-03-20 | Disposition: A | Discharge status: Home / Self Care | Payer: Medicare Other | Source: Ambulatory Visit | Attending: Surgery | Admitting: Surgery

## 2020-03-20 ENCOUNTER — Other Ambulatory Visit: Payer: Self-pay

## 2020-03-20 DIAGNOSIS — R2241 Localized swelling, mass and lump, right lower limb: Secondary | ICD-10-CM | POA: Insufficient documentation

## 2020-03-20 NOTE — Progress Notes (Signed)
Right lower ext. Venous  has been completed. Refer to [facility] under chart review to view preliminary results. Results given to [facility] at Dr. Ron Parker office.   03/20/2020  4:06 PM [name], Bonnye Fava

## 2020-03-21 ENCOUNTER — Encounter (HOSPITAL_COMMUNITY): Payer: Self-pay | Admitting: Radiology

## 2020-03-21 ENCOUNTER — Other Ambulatory Visit (HOSPITAL_COMMUNITY): Payer: Self-pay | Admitting: Surgery

## 2020-03-21 ENCOUNTER — Encounter: Payer: Self-pay | Admitting: Oncology

## 2020-03-21 DIAGNOSIS — R2241 Localized swelling, mass and lump, right lower limb: Secondary | ICD-10-CM

## 2020-03-21 NOTE — Progress Notes (Signed)
Elizabeth Ramirez. [place] Female, 74 y.o., [birth year] MRN:  [id number] Phone:  [phone] [name]) PCP:  Cari Caraway, MD Primary Cvg:  Medicare/Medicare Part A And B Next Appt With Pulmonology 04/09/2020 at 11:00 AM  RE: STAT: US Aspiration / Core Biopsy Received: Today Arne Cleveland, MD  [name] Ok   [place] aspirate R thigh cyst  Previous surgical LN excision. H/o breast ca  Send for cyto, cell count (lymphocele?)   DDH       Previous Messages   ----- Message -----  From: Garth Bigness D  Sent: 03/21/2020  3:30 PM EDT  To: Ir Procedure Requests  Subject: STAT: US Aspiration / Core Biopsy        Procedure: US Guided Aspiration / Core Biopsy per paper order faxed over by MD's office   Reason: Mass of right lower extremity, US guided aspiration/ core BX of any tissue of large cyst mass on right leg   History:  per order See Ultrasound Doppler done 03/20/2020   Provider: Romana Juniper A   Provider Contact: [phone]

## 2020-03-28 ENCOUNTER — Other Ambulatory Visit: Payer: Self-pay | Admitting: Radiology

## 2020-04-01 ENCOUNTER — Ambulatory Visit (HOSPITAL_COMMUNITY)
Admission: RE | Admit: 2020-04-01 | Discharge: 2020-04-01 | Disposition: A | Discharge status: Home / Self Care | Payer: Medicare Other | Source: Ambulatory Visit | Attending: Surgery | Admitting: Surgery

## 2020-04-01 ENCOUNTER — Encounter (HOSPITAL_COMMUNITY): Payer: Self-pay

## 2020-04-01 ENCOUNTER — Other Ambulatory Visit: Payer: Self-pay

## 2020-04-01 ENCOUNTER — Other Ambulatory Visit (HOSPITAL_COMMUNITY): Payer: Self-pay | Admitting: Interventional Radiology

## 2020-04-01 DIAGNOSIS — Z6838 Body mass index (BMI) 38.0-38.9, adult: Secondary | ICD-10-CM | POA: Diagnosis not present

## 2020-04-01 DIAGNOSIS — T8149XA Infection following a procedure, other surgical site, initial encounter: Secondary | ICD-10-CM | POA: Diagnosis not present

## 2020-04-01 DIAGNOSIS — Z7722 Contact with and (suspected) exposure to environmental tobacco smoke (acute) (chronic): Secondary | ICD-10-CM | POA: Diagnosis not present

## 2020-04-01 DIAGNOSIS — E039 Hypothyroidism, unspecified: Secondary | ICD-10-CM | POA: Insufficient documentation

## 2020-04-01 DIAGNOSIS — E559 Vitamin D deficiency, unspecified: Secondary | ICD-10-CM | POA: Insufficient documentation

## 2020-04-01 DIAGNOSIS — Z79899 Other long term (current) drug therapy: Secondary | ICD-10-CM | POA: Diagnosis not present

## 2020-04-01 DIAGNOSIS — Z87891 Personal history of nicotine dependence: Secondary | ICD-10-CM | POA: Insufficient documentation

## 2020-04-01 DIAGNOSIS — C50912 Malignant neoplasm of unspecified site of left female breast: Secondary | ICD-10-CM | POA: Diagnosis not present

## 2020-04-01 DIAGNOSIS — R2241 Localized swelling, mass and lump, right lower limb: Secondary | ICD-10-CM | POA: Diagnosis not present

## 2020-04-01 DIAGNOSIS — C50919 Malignant neoplasm of unspecified site of unspecified female breast: Secondary | ICD-10-CM | POA: Diagnosis not present

## 2020-04-01 LAB — CBC WITH DIFFERENTIAL/PLATELET
Abs Immature Granulocytes: 0.02 10*3/uL (ref 0.00–0.07)
Basophils Absolute: 0 10*3/uL (ref 0.0–0.1)
Basophils Relative: 0 %
Eosinophils Absolute: 0.2 10*3/uL (ref 0.0–0.5)
Eosinophils Relative: 3 %
HCT: 39.2 % (ref 36.0–46.0)
Hemoglobin: 13.1 g/dL (ref 12.0–15.0)
Immature Granulocytes: 0 %
Lymphocytes Relative: 20 %
Lymphs Abs: 0.9 10*3/uL (ref 0.7–4.0)
MCH: 30.4 pg (ref 26.0–34.0)
MCHC: 33.4 g/dL (ref 30.0–36.0)
MCV: 91 fL (ref 80.0–100.0)
Monocytes Absolute: 0.4 10*3/uL (ref 0.1–1.0)
Monocytes Relative: 9 %
Neutro Abs: 3.3 10*3/uL (ref 1.7–7.7)
Neutrophils Relative %: 68 %
Platelets: 237 10*3/uL (ref 150–400)
RBC: 4.31 MIL/uL (ref 3.87–5.11)
RDW: 14.5 % (ref 11.5–15.5)
WBC: 4.8 10*3/uL (ref 4.0–10.5)
nRBC: 0 % (ref 0.0–0.2)

## 2020-04-01 LAB — PROTIME-INR
INR: 1 (ref 0.8–1.2)
Prothrombin Time: 12.4 seconds (ref 11.4–15.2)

## 2020-04-01 MED ORDER — MIDAZOLAM HCL 2 MG/2ML IJ SOLN
INTRAMUSCULAR | Status: AC
  Filled 2020-04-01: qty 2

## 2020-04-01 MED ORDER — SODIUM CHLORIDE 0.9 % IV SOLN: INTRAVENOUS | Status: DC

## 2020-04-01 MED ORDER — FENTANYL CITRATE (PF) 100 MCG/2ML IJ SOLN
INTRAMUSCULAR | Status: AC | PRN
  Administered 2020-04-01 (×2): 50 ug via INTRAVENOUS

## 2020-04-01 MED ORDER — LIDOCAINE-EPINEPHRINE (PF) 2 %-1:200000 IJ SOLN
INTRAMUSCULAR | Status: AC
  Filled 2020-04-01: qty 20

## 2020-04-01 MED ORDER — FENTANYL CITRATE (PF) 100 MCG/2ML IJ SOLN
INTRAMUSCULAR | Status: AC
  Filled 2020-04-01: qty 2

## 2020-04-01 MED ORDER — MIDAZOLAM HCL 2 MG/2ML IJ SOLN
INTRAMUSCULAR | Status: AC | PRN
  Administered 2020-04-01 (×4): 1 mg via INTRAVENOUS

## 2020-04-01 MED ORDER — LIDOCAINE-EPINEPHRINE (PF) 1 %-1:200000 IJ SOLN
INTRAMUSCULAR | Status: AC | PRN
  Administered 2020-04-01: 10 mL via INTRADERMAL

## 2020-04-01 NOTE — Procedures (Signed)
Pre Procedure Dx: Right thigh fluid collection Post Procedural Dx: Same  Technically successful US guided aspiration of 22 cc of serous fluid from dominant collection within the right thigh. Technically successful US guided aspiration of 8 cc of blood tinged serous fluid collection from smaller satellite collection within the anterior aspect of the right thigh.   EBL: None  No immediate complications.   Ronny Bacon, MD Pager #: [phone]

## 2020-04-01 NOTE — Discharge Instructions (Signed)
Please call Interventional Radiology clinic [phone] with any questions or concerns.  You may remove your dressing and shower tomorrow.   Needle Biopsy, Care After These instructions tell you how to care for yourself after your procedure. Your doctor may also give you more specific instructions. Call your doctor if you have any problems or questions. What can I expect after the procedure? After the procedure, it is common to have:  Soreness.  Bruising.  Mild pain. Follow these instructions at home:   Return to your normal activities as told by your doctor. Ask your doctor what activities are safe for you.  Take over-the-counter and prescription medicines only as told by your doctor.  Wash your hands with soap and water before you change your bandage (dressing). If you cannot use soap and water, use hand sanitizer.  Follow instructions from your doctor about: ? How to take care of your puncture site. ? When and how to change your bandage. ? When to remove your bandage.  Check your puncture site every day for signs of infection. Watch for: ? Redness, swelling, or pain. ? Fluid or blood. ? Pus or a bad smell. ? Warmth.  Do not take baths, swim, or use a hot tub until your doctor approves. Ask your doctor if you may take showers. You may only be allowed to take sponge baths.  Keep all follow-up visits as told by your doctor. This is important. Contact a doctor if you have:  A fever.  Redness, swelling, or pain at the puncture site, and it lasts longer than a few days.  Fluid, blood, or pus coming from the puncture site.  Warmth coming from the puncture site. Get help right away if:  You have a lot of bleeding from the puncture site. Summary  After the procedure, it is common to have soreness, bruising, or mild pain at the puncture site.  Check your puncture site every day for signs of infection, such as redness, swelling, or pain.  Get help right away if you  have severe bleeding from your puncture site. This information is not intended to replace advice given to you by your health care provider. Make sure you discuss any questions you have with your health care provider. Document Revised: 06/14/2017 Document Reviewed: 06/14/2017 Elsevier Patient Education  2020 Elsevier Inc.   Moderate Conscious Sedation, Adult, Care After These instructions provide you with information about caring for yourself after your procedure. Your health care provider may also give you more specific instructions. Your treatment has been planned according to current medical practices, but problems sometimes occur. Call your health care provider if you have any problems or questions after your procedure. What can I expect after the procedure? After your procedure, it is common:  To feel sleepy for several hours.  To feel clumsy and have poor balance for several hours.  To have poor judgment for several hours.  To vomit if you eat too soon. Follow these instructions at home: For at least 24 hours after the procedure:   Do not: ? Participate in activities where you could fall or become injured. ? Drive. ? Use heavy machinery. ? Drink alcohol. ? Take sleeping pills or medicines that cause drowsiness. ? Make important decisions or sign legal documents. ? Take care of children on your own.  Rest. Eating and drinking  Follow the diet recommended by your health care provider.  If you vomit: ? Drink water, juice, or soup when you can drink without vomiting. ?   Make sure you have little or no nausea before eating solid foods. General instructions  Have a responsible adult stay with you until you are awake and alert.  Take over-the-counter and prescription medicines only as told by your health care provider.  If you smoke, do not smoke without supervision.  Keep all follow-up visits as told by your health care provider. This is important. Contact a health care  provider if:  You keep feeling nauseous or you keep vomiting.  You feel light-headed.  You develop a rash.  You have a fever. Get help right away if:  You have trouble breathing. This information is not intended to replace advice given to you by your health care provider. Make sure you discuss any questions you have with your health care provider. Document Revised: 05/14/2017 Document Reviewed: 09/21/2015 Elsevier Patient Education  2020 Elsevier Inc.   

## 2020-04-01 NOTE — Consult Note (Signed)
Chief Complaint: Patient was seen in consultation today for image guided aspiration of right thigh cyst  Referring Physician(s): Connor,Chelsea A  Supervising Physician: Sandi Mariscal  Patient Status: [facility] - Out-pt  History of Present Illness: Elizabeth Ramirez is a 74 y.o. female with history of right breast carcinoma in 2005 and 2016 with prior lumpectomies and radiation therapy.  She is status post right inguinal lymph node excision on 02/23/2020 with pathology revealing follicular hyperplasia scattered giant cells and epithelioid granulomas but no malignancy.  Postop she developed discomfort at rt inner thigh region region with lower extremity venous Doppler study on 10/6 revealing a large cystic mass in the right proximal thigh extending into the mid thigh measuring 7.5 x 6.2 cm at the largest diameter.  She presents today for image guided aspiration of the cystic mass.  Additional medical history as below.  Past Medical History:  Diagnosis Date  . Allergy    tetracycline, levofloxacin, crabs & bay leaves  . Arthritis    Rt knee, Rt shoulder  . 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 Cancer ([place])   . Ear infection 10/19/2013  . Genetic testing 01/18/2015   Negative - no mutations found in any of 20 genes on the Breast/Ovarian Cancer Panel.  Additionally, no variants of uncertain significance (VUSs) were found.  The Breast/Ovarian gene panel offered by [facility] [name], MD) includes sequencing and deletion/duplication analysis for the following 19 genes:  ATM, BARD1, BRCA1, BRCA2, BRIP1, CDH1, CHEK2, FANCC, MLH1, MSH2, MSH6, NBN, PALB2, PMS2, PTEN, RAD51C, RAD51D, TP53, and XRCC2.  This panel also includes deletion/duplication analysis (without sequencing) for one gene, EPCAM.  Date of report is January 14, 2015.    Marland Kitchen Heart murmur   . History of kidney stones    x1 '71  .  Hypothyroid 07/29/2011  . Hypothyroidism   . Malignant neoplasm of upper-outer quadrant of right breast in female, estrogen receptor positive ([place]) 01/14/2015  . OA (osteoarthritis) of knee 05/27/2015  . Obesity, morbid, BMI 40.0-49.9 ([place]) 12/31/2015  . Other chronic pain   . Personal history of radiation therapy   . PONV (postoperative nausea and vomiting)    extreme nausea and vomiting - cannot tolerate scopalamine   . 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 Seasonal allergies   . Thyroid disease   . Vitamin D deficiency     Past Surgical History:  Procedure Laterality Date  . APPENDECTOMY  08/23/2006  . BREAST LUMPECTOMY Right 09/2003   DCIS  . BREAST LUMPECTOMY Right 2016  . BREAST LUMPECTOMY WITH NEEDLE LOCALIZATION Right 02/11/2015   Procedure: WIRE BRACKETED RIGHT BREAST LUMPECTOMY ;  Surgeon: Excell Seltzer, MD;  Location: [place];  Service: General;  Laterality: Right;  . CATARACT EXTRACTION, BILATERAL  10-05-2017 left ; 11-09-2017 right   Dr Kandy Garrison   . colonoscopy with biopsy  12/28/2003   neg  . LYMPH NODE DISSECTION Right 02/23/2020   Procedure: right inguinal lymph node excision;  Surgeon: Alphonsa Overall, MD;  Location: [place];  Service: General;  Laterality: Right;  . REVERSE SHOULDER ARTHROPLASTY Left 07/14/2018   Procedure: REVERSE SHOULDER ARTHROPLASTY;  Surgeon: Justice Britain, MD;  Location: [facility];  Service: Orthopedics;  Laterality: Left;  Interscalene Block  . TONSILLECTOMY     1954  .  TOTAL KNEE ARTHROPLASTY Right 05/27/2015   Procedure: RIGHT TOTAL KNEE ARTHROPLASTY;  Surgeon: Gaynelle Arabian, MD;  Location: [facility];  Service: Orthopedics;  Laterality: Right;  . WISDOM TOOTH EXTRACTION Bilateral 1965    Allergies: Levofloxacin, Tetracyclines & related, Other, Ciprofloxacin, Flonase [fluticasone], Ibuprofen, Nitrofurantoin monohyd  macro, and Scopolamine  Medications: Prior to Admission medications   Medication Sig Start Date End Date Taking? Authorizing Provider  Calcium Carbonate-Vitamin D (CALTRATE 600+D PO) Take by mouth.   Yes [provider]  ipratropium (ATROVENT) 0.03 % nasal spray Place 1 spray into both nostrils daily as needed (seasonal allergies.).    Yes [provider]  levothyroxine (SYNTHROID, LEVOTHROID) 137 MCG tablet Take 137 mcg by mouth daily before breakfast.   Yes [provider]  traMADol (ULTRAM) 50 MG tablet Take 1 tablet (50 mg total) by mouth every 6 (six) hours as needed for moderate pain or severe pain. 02/23/20   Alphonsa Overall, MD     Family History  Problem Relation Age of Onset  . Dementia Mother   . Other Mother 62       "spot on lung"; secondhand smoke exposure  . Pulmonary fibrosis Father   . Diabetes Brother   . Thyroid disease Brother   . Leukemia Brother 70       chronic lymphocytic   . Stomach cancer Maternal Grandmother        dx. 57s  . Heart attack Maternal Grandfather   . Diabetes Paternal Grandfather   . Stroke Paternal Grandfather   . Colitis Paternal Grandmother   . Parkinson's disease Maternal Aunt   . Diabetes Paternal Aunt   . Other Paternal Aunt        bowel obstruction  . Heart Problems Paternal Uncle   . Ovarian cancer Maternal Aunt        dx. 68s; paternal half sister of her mother  . Multiple myeloma Maternal Aunt   . Multiple myeloma Maternal Aunt 75  . Kidney failure Paternal Aunt   . Pulmonary fibrosis Paternal Aunt   . Stroke Paternal Aunt   . Heart Problems Paternal Aunt   . Emphysema Paternal Uncle   . Heart Problems Paternal Uncle   . Lung cancer Paternal Uncle        smoker  . Colon cancer Cousin   . Breast cancer Cousin        dx. 71s    Social History   Socioeconomic History  . Marital status: Soil scientist    Spouse name: Not on file  . Number of children: 0  . Years of education: Not on file  .  Highest education level: Not on file  Occupational History  . Not on file  Tobacco Use  . Smoking status: Former Smoker    Packs/day: 1.00    Years: 9.00    Pack years: 9.00    Types: Cigarettes    Quit date: 06/16/1983    Years since quitting: 36.8  . Smokeless tobacco: Never Used  Substance and Sexual Activity  . Alcohol use: Yes    Alcohol/week: 2.0 standard drinks    Types: 2 Glasses of wine per week    Comment: one glass w/ dinner  . Drug use: No  . Sexual activity: Not on file  Other Topics Concern  . Not on file  Social History Narrative  . Not on file   Social Determinants of Health   Financial Resource Strain:   . Difficulty of Paying Living Expenses: Not  on file  Food Insecurity:   . Worried About Charity fundraiser in the Last Year: Not on file  . Ran Out of Food in the Last Year: Not on file  Transportation Needs:   . Lack of Transportation (Medical): Not on file  . Lack of Transportation (Non-Medical): Not on file  Physical Activity:   . Days of Exercise per Week: Not on file  . Minutes of Exercise per Session: Not on file  Stress:   . Feeling of Stress : Not on file  Social Connections:   . Frequency of Communication with Friends and Family: Not on file  . Frequency of Social Gatherings with Friends and Family: Not on file  . Attends Religious Services: Not on file  . Active Member of Clubs or Organizations: Not on file  . Attends Archivist Meetings: Not on file  . Marital Status: Not on file      Review of Systems currently denies fever, headache, chest pain, dyspnea, cough, abdominal/back pain, nausea, vomiting or bleeding. She does have tenderness right inner thigh region.  Vital Signs: BP 138/74   Pulse 83   Temp 98.4 F (36.9 C) (Oral)   Resp 18   Ht '5\' 5"'  (1.651 m)   Wt 234 lb (106.1 kg)   SpO2 97%   BMI 38.94 kg/m   Physical Exam awake, alert.  Chest clear to auscultation bilaterally.  Heart with regular rate and rhythm.   Abdomen soft, positive bowel sounds, nontender.  No lower extremity edema.  Firmness/erythema noted right inner thigh region, area tender to palpation.  Imaging: VAS [place] LOWER EXTREMITY VENOUS (DVT)  Result Date: 03/20/2020  Lower Venous DVTStudy Other Indications: Status post right injuinal lymph node excision on 02/23/20.                    Patient presents with a right upper thigh mass. Comparison Study: No prior RLE. Performing Technologist: Oda Cogan RDMS, RVT  Examination Guidelines: A complete evaluation includes B-mode imaging, spectral Doppler, color Doppler, and power Doppler as needed of all accessible portions of each vessel. Bilateral testing is considered an integral part of a complete examination. Limited examinations for reoccurring indications may be performed as noted. The reflux portion of the exam is performed with the patient in reverse Trendelenburg.  +---------+---------------+---------+-----------+----------+--------------+ RIGHT    CompressibilityPhasicitySpontaneityPropertiesThrombus Aging +---------+---------------+---------+-----------+----------+--------------+ CFV      Full           Yes      Yes                                 +---------+---------------+---------+-----------+----------+--------------+ SFJ      Full                                                        +---------+---------------+---------+-----------+----------+--------------+ FV Prox  Full                                                        +---------+---------------+---------+-----------+----------+--------------+ FV Mid   Full                                                        +---------+---------------+---------+-----------+----------+--------------+  FV DistalFull                                                        +---------+---------------+---------+-----------+----------+--------------+ PFV      Full                                                         +---------+---------------+---------+-----------+----------+--------------+ POP      Full           Yes      Yes                                 +---------+---------------+---------+-----------+----------+--------------+ PTV      Full                                                        +---------+---------------+---------+-----------+----------+--------------+ PERO     Full                                                        +---------+---------------+---------+-----------+----------+--------------+   +----+---------------+---------+-----------+----------+--------------+ LEFTCompressibilityPhasicitySpontaneityPropertiesThrombus Aging +----+---------------+---------+-----------+----------+--------------+ CFV Full           Yes      Yes                                 +----+---------------+---------+-----------+----------+--------------+ SFJ Full                                                        +----+---------------+---------+-----------+----------+--------------+     Summary: RIGHT: - There is no evidence of deep vein thrombosis in the lower extremity.  - A large cystic mass in the right proximal thigh extending into the mid thigh measuring 7.5 x 6.2 cm at the largest diameter.  LEFT: - No evidence of common femoral vein obstruction.  *See table(s) above for measurements and observations. Electronically signed by Harold Barban MD on 03/20/2020 at 9:06:40 PM.    Final     Labs:  CBC: Recent Labs    01/26/20 1220 03/01/20 1447 04/01/20 1120  WBC 4.0 4.5 4.8  HGB 13.5 13.5 13.1  HCT 40.0 39.4 39.2  PLT 171 166 237    COAGS: Recent Labs    04/01/20 1120  INR 1.0    BMP: Recent Labs    01/26/20 1220 03/01/20 1447  NA 141 142  K 4.7 4.4  CL 107 107  CO2 23 26  GLUCOSE 100* 91  BUN 15 18  CALCIUM 10.0 9.6  CREATININE 1.28* 1.21*  GFRNONAA 41* 44*  GFRAA  47* 51*    LIVER FUNCTION TESTS: Recent Labs    01/26/20 1220  03/01/20 1447  BILITOT 1.0 1.1  AST 14* 14*  ALT 15 14  ALKPHOS 88 80  PROT 6.9 7.0  ALBUMIN 3.7 3.9    TUMOR MARKERS: No results for input(s): AFPTM, CEA, CA199, CHROMGRNA in the last 8760 hours.  Assessment and Plan: 74 y.o. female with history of right breast carcinoma in 2005 and 2016 with prior lumpectomies and radiation therapy.  She is status post right inguinal lymph node excision on 02/23/2020 with pathology revealing follicular hyperplasia scattered giant cells and epithelioid granulomas but no malignancy.  Postop she developed discomfort at rt inner thigh region region with lower extremity venous Doppler study on 10/6 revealing a large cystic mass in the right proximal thigh extending into the mid thigh measuring 7.5 x 6.2 cm at the largest diameter.  She presents today for image guided aspiration of the cystic mass.  Risks and benefits of procedure was discussed with the patient  including, but not limited to bleeding, infection, damage to adjacent structures or low yield requiring additional tests.  All of the questions were answered and there is agreement to proceed.  Consent signed and in chart.     Thank you for this interesting consult.  I greatly enjoyed meeting OVEDA DADAMO and look forward to participating in their care.  A copy of this report was sent to the requesting provider on this date.  Electronically Signed: D. Rowe Robert, PA-C 04/01/2020, 11:50 AM   I spent a total of  25 minutes   in face to face in clinical consultation, greater than 50% of which was counseling/coordinating care for image guided aspiration of right cystic mass

## 2020-04-02 LAB — CYTOLOGY - NON PAP

## 2020-04-09 ENCOUNTER — Institutional Professional Consult (permissible substitution): Payer: Medicare Other | Admitting: Pulmonary Disease

## 2020-04-09 ENCOUNTER — Institutional Professional Consult (permissible substitution): Payer: Medicare Other | Admitting: Internal Medicine

## 2020-04-10 DIAGNOSIS — E039 Hypothyroidism, unspecified: Secondary | ICD-10-CM | POA: Diagnosis not present

## 2020-04-10 DIAGNOSIS — R3915 Urgency of urination: Secondary | ICD-10-CM | POA: Diagnosis not present

## 2020-04-10 DIAGNOSIS — M7989 Other specified soft tissue disorders: Secondary | ICD-10-CM | POA: Diagnosis not present

## 2020-04-10 DIAGNOSIS — E559 Vitamin D deficiency, unspecified: Secondary | ICD-10-CM | POA: Diagnosis not present

## 2020-04-10 DIAGNOSIS — E782 Mixed hyperlipidemia: Secondary | ICD-10-CM | POA: Diagnosis not present

## 2020-04-10 DIAGNOSIS — N390 Urinary tract infection, site not specified: Secondary | ICD-10-CM | POA: Diagnosis not present

## 2020-04-10 DIAGNOSIS — L0390 Cellulitis, unspecified: Secondary | ICD-10-CM | POA: Diagnosis not present

## 2020-04-15 DIAGNOSIS — E782 Mixed hyperlipidemia: Secondary | ICD-10-CM | POA: Diagnosis not present

## 2020-04-15 DIAGNOSIS — D0511 Intraductal carcinoma in situ of right breast: Secondary | ICD-10-CM | POA: Diagnosis not present

## 2020-04-15 DIAGNOSIS — Z0000 Encounter for general adult medical examination without abnormal findings: Secondary | ICD-10-CM | POA: Diagnosis not present

## 2020-04-15 DIAGNOSIS — Z1389 Encounter for screening for other disorder: Secondary | ICD-10-CM | POA: Diagnosis not present

## 2020-04-15 DIAGNOSIS — E039 Hypothyroidism, unspecified: Secondary | ICD-10-CM | POA: Diagnosis not present

## 2020-04-15 DIAGNOSIS — B009 Herpesviral infection, unspecified: Secondary | ICD-10-CM | POA: Diagnosis not present

## 2020-04-15 DIAGNOSIS — Z01812 Encounter for preprocedural laboratory examination: Secondary | ICD-10-CM | POA: Diagnosis not present

## 2020-04-15 DIAGNOSIS — J309 Allergic rhinitis, unspecified: Secondary | ICD-10-CM | POA: Diagnosis not present

## 2020-04-15 DIAGNOSIS — N1832 Chronic kidney disease, stage 3b: Secondary | ICD-10-CM | POA: Diagnosis not present

## 2020-04-15 DIAGNOSIS — N390 Urinary tract infection, site not specified: Secondary | ICD-10-CM | POA: Diagnosis not present

## 2020-04-15 DIAGNOSIS — M1712 Unilateral primary osteoarthritis, left knee: Secondary | ICD-10-CM | POA: Diagnosis not present

## 2020-05-06 ENCOUNTER — Other Ambulatory Visit: Payer: Self-pay

## 2020-05-06 ENCOUNTER — Encounter: Payer: Self-pay | Admitting: Internal Medicine

## 2020-05-06 ENCOUNTER — Ambulatory Visit (INDEPENDENT_AMBULATORY_CARE_PROVIDER_SITE_OTHER): Payer: Medicare Other | Admitting: Internal Medicine

## 2020-05-06 VITALS — BP 118/76 | HR 85 | Ht 64.0 in | Wt 229.8 lb

## 2020-05-06 DIAGNOSIS — Z853 Personal history of malignant neoplasm of breast: Secondary | ICD-10-CM

## 2020-05-06 DIAGNOSIS — R599 Enlarged lymph nodes, unspecified: Secondary | ICD-10-CM

## 2020-05-06 DIAGNOSIS — R591 Generalized enlarged lymph nodes: Secondary | ICD-10-CM

## 2020-05-06 DIAGNOSIS — R918 Other nonspecific abnormal finding of lung field: Secondary | ICD-10-CM

## 2020-05-06 DIAGNOSIS — Z836 Family history of other diseases of the respiratory system: Secondary | ICD-10-CM | POA: Diagnosis not present

## 2020-05-06 DIAGNOSIS — R911 Solitary pulmonary nodule: Secondary | ICD-10-CM

## 2020-05-06 LAB — SEDIMENTATION RATE: Sed Rate: 7 mm/hr (ref 0–30)

## 2020-05-06 NOTE — Progress Notes (Signed)
OV 05/06/2020  Subjective:  Patient ID: Elizabeth Ramirez, female , DOB: May 20, 1946 , age 74 y.o. , MRN: [id number] , ADDRESS: [place] [id number] PCP Cari Caraway, MD Patient Care Team: Cari Caraway, MD as PCP - General (Family Medicine) Magrinat, Virgie Dad, MD as Consulting Physician (Oncology) Excell Seltzer, MD (Inactive) as Consulting Physician (General Surgery) Bobbye Charleston, MD as Consulting Physician (Obstetrics and Gynecology) Justice Britain, MD as Consulting Physician (Orthopedic Surgery)  This Provider for this visit: Treatment Team:  Attending Provider: Brand Males, MD    05/06/2020 -   Chief Complaint  Patient presents with  . Consult    Pt is being referred by Dr. [name] for possible sarcoidosis. Pt denies any complaints of cough, SOB, or CP.     HPI Elizabeth Ramirez 74 y.o. -  Background history from the oncologist -in September 2021   (1) status post right lumpectomy April 2005 for a pT1a pN0, stage IA invasive breast cancer, estrogen and progesterone receptor positive, HER-2 negative, in the setting of DCIS;              (a) did not receive radiation             (b) status post anastrozole for 5 years completed June 2010  (2) status post right breast upper outer quadrant biopsy 12/21/2014 for ductal carcinoma in situ, grade 2 or 3, estrogen and progesterone receptor positive.  (3) Status post right lumpectomy 02/11/2015 showing ductal carcinoma in situ, intermediate grade, measuring 0.9 cm, with close but negative margins  (4) adjuvant radiation: 03/07/2015-04/04/2015  Right breast/ 42.5 Gy at 2.5 Gy per fraction x 17 fractions.  Right breast boost/ 7.5 Gy at 2.5 Gy per fraction x 3 fractions  (5) genetic testing 01/14/2015 through the [place] Panel offered by [facility] [name], MD) showed no deleterious mutations in ATM, BARD1, BRCA1, BRCA2, BRIP1, CDH1, CHEK2,  FANCC, MLH1, MSH2, MSH6, NBN, PALB2, PMS2, PTEN, RAD51C, RAD51D, TP53, and XRCC2. This panel also includes deletion/duplication analysis (without sequencing) for one gene, EPCAM.  (6) resumed anastrozole 07/06/2014, discontinued March 2017 with arthralgias and myalgias developing              (a) started letrozole 10/14/2015--discontinued after a brief course secondary to side effects             (b) Bone density 03/29/2015 was normal, with the lowest T score at 0.4   Imagine is now just over 5 years out from definitive surgery for her breast cancer with no evidence of disease recurrence.  This is favorable.  We were preparing to discharge her from follow-up when she was found to have diffuse lymphadenopathy.  We obtained a lymph node biopsy which shows no evidence of breast cancer or lymphoma.  There are granulomas but no evidence of tuberculosis or other chronic infection.  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  HPI 05/06/2020   -New consult evaluation.  Referred by Dr. [name]  74 year old female who is dealing with left knee issue and is awaiting a left knee replacement May 20, 2020 with Dr. [name].  She is under surveillance for breast cancer and right when she is going to be discharged from long-term follow-up lymphadenopathy was described and discovered.  Started off with a lung nodule discovery in the left lower lobe 11 mm along with axillary node discovery on a CT chest.  This resulted in PET scan that is described  below.  There is diffuse uptake particularly in the abdomen and the liver and the splenic areas.  She also had a inguinal node.  This was then biopsied in September 2021 and showed granuloma.  Sarcoidosis under consideration but she has granulomatous adenitis.  She basically has no respiratory symptoms.  She been referred here for evaluation of this.  There is no hemoptysis or shortness of breath or wheezing or cough.  There is no weight loss or fever or  chills or any B symptoms.  Currently she is most concerned about getting and going through her left knee replacement.  Rest of the history is documented as a copy and paste from Dr. [name]  She is concerned about these findings.  Particularly she reports a strong family so pulmonary fibrosis in her dad and a paternal aunt and a paternal cousin who is the niece of the paternal aunt and her dad.   - IMPRESSION: CT chest January 26, 2020 11 x 7 mm irregular nodule in the superior segment left lower lobe, new. This is indeterminate but raises concern for primary bronchogenic neoplasm or metastasis. This is just at the size threshold for PET sensitivity. Consider PET-CT for further evaluation.  Mildly progressive left axillary/subpectoral nodes measuring up to 11 mm short axis. Contralateral nodal metastases would be unusual. Reactive nodes or low-grade lymphoproliferative disorder are also possible. This can be evaluated at the time of PET-CT.  10 mm short axis left celiac axis node, also minimally progressive, warranting attention at the time of PET-CT.  Aortic Atherosclerosis (ICD10-I70.0) and Emphysema (ICD10-J43.9).   Electronically Signed   By: [name] M.D.   On: 01/26/2020 16:22  PET scan February 08, 2020  IMPRESSION: 1. Generalized nodal enlargement and or hypermetabolic lymph nodes with splenic enlargement and increased metabolic activity findings are most suggestive of lymphoproliferative disorder/lymphoma. RIGHT groin lymph node with considerable FDG uptake may be the best target for sampling. Would avoid LEFT axillary lymph nodes given that the patient reportedly has had recent COVID-19 vaccination which can be confounding on PET scan. Correlate with side of vaccine administration. Metastatic disease is also considered though would be unusual in the setting of breast cancer with pelvic node predominant disease and splenic enlargement with  mild hypermetabolism. 2. Small lymph nodes in the neck and potential RIGHT parotid lesion, this could be related but would suggest dedicated assessment of the parotid gland for further evaluation, this could be performed with CT or ultrasound as an initial means in further evaluating this area. 3. Small lung nodule in the LEFT lower lobe is nonspecific in terms of FDG uptake suggesting indolent process. Morphologic features raising the question of bronchogenic neoplasm as outlined previously. 4. Mild increased FDG uptake in the RIGHT breast may relate to recent biopsy at the site of previous surgery, refer to biopsy results and mammography for further information. 5. Lobular hepatic contours could also be seen in the setting of liver disease, correlate with any clinical or laboratory evidence of liver disease. Liver disease could be an alternative explanation for splenic enlargement. 6. Activation of brown fat along the spine and potentially in the area of the occipital region and posterior neck.   Electronically Signed   By: [name] M.D.   On: 02/08/2020 16:59  A. LYMPH NODE, RIGHT INGUINAL, BIOPSY: February 23, 2020 - Lymph node with follicular hyperplasia, scattered giant cells and  epithelioid granulomas  - No morphologic or immunophenotypic evidence of lymphoma  - See comment  Famil hx [place] Cancer father died at the age of 59 from pulmonary fibrosis.  A paternal aunt also had pulmonary fibrosis.  A paternal cousin also has pulmonary fibrosis.  Her mother died at age 61 with severe dementia. The patient had one brother no sisters. Her maternal grandmother died from stomach cancer the age of 89. The patient's mother had a half sister with ovarian cancer.   SOCIAL HISTORY:  Works as a Cabin crew, Publishing copy in renovations. She retired from [employer] as of 2018 and is now doing some Electronics engineer. At home it's her and [name] , who also is in real estate, with  her own investment business..          has a past medical history of Allergy, Arthritis, Breast cancer ([place]) (2016; 2005), Cancer [facility]), Ear infection (10/19/2013), Genetic testing (01/18/2015), Heart murmur, History of kidney stones, Hypothyroid (07/29/2011), Hypothyroidism, Malignant neoplasm of upper-outer quadrant of right breast in female, estrogen receptor positive ([place]) (01/14/2015), OA (osteoarthritis) of knee (05/27/2015), Obesity, morbid, BMI 40.0-49.9 ([place]) (12/31/2015), Other chronic pain, Personal history of radiation therapy, PONV (postoperative nausea and vomiting), 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   reports that she quit smoking about 36 years ago. Her smoking use included cigarettes. She has a 9.00 pack-year smoking history. She has never used smokeless tobacco.  Past Surgical History:  Procedure Laterality Date  . APPENDECTOMY  08/23/2006  . BREAST LUMPECTOMY Right 09/2003   DCIS  . BREAST LUMPECTOMY Right 2016  . BREAST LUMPECTOMY WITH NEEDLE LOCALIZATION Right 02/11/2015   Procedure: WIRE BRACKETED RIGHT BREAST LUMPECTOMY ;  Surgeon: Excell Seltzer, MD;  Location: [facility];  Service: General;  Laterality: Right;  . CATARACT EXTRACTION, BILATERAL  10-05-2017 left ; 11-09-2017 right   Dr Kandy Garrison   . colonoscopy with biopsy  12/28/2003   neg  . LYMPH NODE DISSECTION Right 02/23/2020   Procedure: right inguinal lymph node excision;  Surgeon: Alphonsa Overall, MD;  Location: [facility];  Service: General;  Laterality: Right;  . REVERSE SHOULDER ARTHROPLASTY Left 07/14/2018   Procedure: REVERSE SHOULDER ARTHROPLASTY;  Surgeon: Justice Britain, MD;  Location: [facility];  Service: Orthopedics;  Laterality: Left;  Interscalene Block  . TONSILLECTOMY     1954  . TOTAL KNEE ARTHROPLASTY Right 05/27/2015   Procedure: RIGHT TOTAL KNEE  ARTHROPLASTY;  Surgeon: Gaynelle Arabian, MD;  Location: [facility];  Service: Orthopedics;  Laterality: Right;  . WISDOM TOOTH EXTRACTION Bilateral 1965    Allergies  Allergen Reactions  . Levofloxacin Other (See Comments)    Affected tendons, severe pain and swelling  . Tetracyclines & Related Swelling    tongue swelling and peeling  . Other Diarrhea and Rash    Crabs - diarrhea. Bay leaves - rash in her mouth  . Ciprofloxacin     Tendon problems   . Flonase [Fluticasone]     Nasal spray cause nose bleeds  . Ibuprofen     Gets mouth ulcers if she takes for more than 3 days; Aleve okay   . Nitrofurantoin Monohyd Macro Hives  . Scopolamine     "Headache and redness of eyes"    Immunization History  Administered Date(s) Administered  . Influenza, High Dose Seasonal PF 02/15/2020  . Influenza,inj,quad, With Preservative 02/13/2017, 03/15/2018, 03/16/2019  . PFIZER SARS-COV-2 Vaccination 07/07/2019, 07/28/2019, 03/12/2020    Family History  Problem Relation Age of Onset  . Dementia Mother   .  Other Mother 42       "spot on lung"; secondhand smoke exposure  . Pulmonary fibrosis Father   . Diabetes Brother   . Thyroid disease Brother   . Leukemia Brother 70       chronic lymphocytic   . Stomach cancer Maternal Grandmother        dx. 63s  . Heart attack Maternal Grandfather   . Diabetes Paternal Grandfather   . Stroke Paternal Grandfather   . Colitis Paternal Grandmother   . Parkinson's disease Maternal Aunt   . Diabetes Paternal Aunt   . Other Paternal Aunt        bowel obstruction  . Heart Problems Paternal Uncle   . Ovarian cancer Maternal Aunt        dx. 20s; paternal half sister of her mother  . Multiple myeloma Maternal Aunt   . Multiple myeloma Maternal Aunt 75  . Kidney failure Paternal Aunt   . Pulmonary fibrosis Paternal Aunt   . Stroke Paternal Aunt   . Heart Problems Paternal Aunt   . Emphysema Paternal Uncle   . Heart Problems Paternal Uncle   . Lung  cancer Paternal Uncle        smoker  . Colon cancer Cousin   . Breast cancer Cousin        dx. 50s     Current Outpatient Medications:  .  Calcium Carbonate-Vitamin D (CALTRATE 600+D PO), Take by mouth., Disp: , Rfl:  .  ipratropium (ATROVENT) 0.03 % nasal spray, Place 1 spray into both nostrils daily as needed (seasonal allergies.). , Disp: , Rfl:  .  levothyroxine (SYNTHROID, LEVOTHROID) 137 MCG tablet, Take 137 mcg by mouth daily before breakfast., Disp: , Rfl:       Objective:   Vitals:   05/06/20 1151  BP: 118/76  Pulse: 85  SpO2: 97%  Weight: 229 lb 12.8 oz (104.2 kg)  Height: _0  (1.626 m)    Estimated body mass index is 39.45 kg/m as calculated from the following:   Height as of this encounter: _1  (1.626 m).   Weight as of this encounter: 229 lb 12.8 oz (104.2 kg).  _2 @  Filed Weights   05/06/20 1151  Weight: 229 lb 12.8 oz (104.2 kg)     Physical Exam  General: No distress. obese Neuro: Alert and Oriented x 3. GCS 15. Speech normal Psych: Pleasant Resp:  Barrel Chest - no.  Wheeze - no, Crackles - no, No overt respiratory distress CVS: Normal heart sounds. Murmurs - no Ext: Stigmata of Connective Tissue Disease - no. HAS CANE HEENT: Normal upper airway. PEERL +. No post nasal drip        Assessment:       ICD-10-CM   1. Granulomatous adenopathy  R59.9 Sedimentation rate    Angiotensin converting enzyme    ANCA Screen Reflex Titer    ANCA Screen Reflex Titer    Angiotensin converting enzyme    Sedimentation rate  2. Lymphadenopathy  R59.1 Sedimentation rate    Angiotensin converting enzyme    ANCA Screen Reflex Titer    ANCA Screen Reflex Titer    Angiotensin converting enzyme    Sedimentation rate  3. Pulmonary nodule  R91.1   4. History of breast cancer  Z85.3   5. Family history of pulmonary fibrosis  Z83.6 Ambulatory referral to Genetics  6. Abnormal findings on diagnostic imaging of lung  R91.8 CT Chest High Resolution     US Abdomen Limited  RUQ (LIVER/GB)       Plan:     Patient Instructions  Granulomatous adenopathy Lymphadenopathy Pulmonary nodule Hx of breast cancer   - check ESR, Quantifern Gold, ACE blood work, ANCA blood work 05/06/2020 - do HRCT supine and prone in 3 months   Family history of pulmonary fibrosis - dad, paternal [place] and paternal cousin  No evidence of Pulmonary Fibrosis in Aug 2021 [place]  plan refere Roma Kayser geenetic counselor  Abnormal PET scan of liver and spleen   - do RUQ and abd ultrasound next few weeks  - will d/w Dr [name] if we need to get a CT abd/pelvis of abdomen in few months  Followup  - 3 months but after CT chest   ( Level 05 visit:  New 60-74 min   in  visit type: on-site physical face to visit  in total care time and counseling or/and coordination of care by this undersigned MD - Dr Brand Males. This includes one or more of the following on this same day 05/06/2020: pre-charting, chart review, note writing, documentation discussion of test results, diagnostic or treatment recommendations, prognosis, risks and benefits of management options, instructions, education, compliance or risk-factor reduction. It excludes time spent by the [facility] or office staff in the care of the patient. Actual time 65 min)   SIGNATURE    Dr. Brand Males, M.D., F.C.C.P,  Pulmonary and Critical Care Medicine Staff Physician, [place] Director - Interstitial Lung Disease  Program  Pulmonary [place] [facility] [place], [place], [address]  Pager: [phone], If no answer or between  15:00h - 7:00h: call [phone] Telephone: [phone]  5:34 PM 05/06/2020

## 2020-05-06 NOTE — Patient Instructions (Addendum)
Granulomatous adenopathy Lymphadenopathy Pulmonary nodule Hx of breast cancer   - check ESR, Quantifern Gold, ACE blood work, ANCA blood work 05/06/2020 - do HRCT supine and prone in 3 months   Family history of pulmonary fibrosis - dad, paternal [place] and paternal cousin  No evidence of Pulmonary Fibrosis in Aug 2021 [place]  plan refere Roma Kayser geenetic counselor  Abnormal PET scan of liver and spleen   - do RUQ and abd ultrasound next few weeks  - will d/w Dr [name] if we need to get a CT abd/pelvis of abdomen in few months  Followup  - 3 months but after CT chest

## 2020-05-07 ENCOUNTER — Telehealth: Payer: Self-pay | Admitting: Genetic Counselor

## 2020-05-07 NOTE — Telephone Encounter (Signed)
Scheduled appointment per 11/22 referral. Spoke to patient who is aware of appointment date and time.

## 2020-05-08 LAB — ANGIOTENSIN CONVERTING ENZYME: Angiotensin-Converting Enzyme: 36 U/L (ref 9–67)

## 2020-05-08 LAB — ANCA SCREEN W REFLEX TITER: ANCA Screen: NEGATIVE

## 2020-05-09 NOTE — Progress Notes (Signed)
ESR, ANCA and ACE blood work are normal - in connection with granulomas. However, looks like Quantiferon Gold test does NOT seem to be done. If so, please order that.  Please let patient know., Is a non urgent test

## 2020-05-10 ENCOUNTER — Ambulatory Visit (HOSPITAL_COMMUNITY)
Admission: RE | Admit: 2020-05-10 | Discharge: 2020-05-10 | Disposition: A | Discharge status: Home / Self Care | Payer: Medicare Other | Source: Ambulatory Visit | Attending: Internal Medicine | Admitting: Internal Medicine

## 2020-05-10 ENCOUNTER — Other Ambulatory Visit: Payer: Self-pay

## 2020-05-10 DIAGNOSIS — R918 Other nonspecific abnormal finding of lung field: Secondary | ICD-10-CM | POA: Diagnosis not present

## 2020-05-10 DIAGNOSIS — K760 Fatty (change of) liver, not elsewhere classified: Secondary | ICD-10-CM | POA: Diagnosis not present

## 2020-05-13 NOTE — Patient Instructions (Addendum)
DUE TO COVID-19 ONLY ONE VISITOR IS ALLOWED TO COME WITH YOU AND STAY IN THE WAITING ROOM ONLY DURING PRE OP AND PROCEDURE.   IF YOU WILL BE ADMITTED INTO THE HOSPITAL YOU ARE ALLOWED ONE SUPPORT PERSON DURING VISITATION HOURS ONLY (10AM -8PM)   . The support person may change daily. . The support person must pass our screening, gel in and out, and wear a mask at all times, including in the patient's room. . Patients must also wear a mask when staff or their support person are in the room.   COVID SWAB TESTING MUST BE COMPLETED ON:  Friday, 05-17-20 @ 2:05 PM   [address] [place], [place] [address]  (Must self quarantine after testing. Follow instructions on handout.)       Your procedure is scheduled on:  Monday, 05-20-20   Report to [facility] Main  Entrance   Report to admitting at 8:00 AM   Call this number if you have problems the morning of surgery [phone]   Do not eat food :After Midnight.   May have liquids until 7:30 AM  day of surgery  CLEAR LIQUID DIET  Foods Allowed                                                                     Foods Excluded  Water, Black Coffee and tea, regular and decaf              liquids that you cannot  Plain Jell-O in any flavor  (No red)                                     see through such as: Fruit ices (not with fruit pulp)                                      milk, soups, orange juice              Iced Popsicles (No red)                                      All solid food                                   Apple juices Sports drinks like Gatorade (No red) Lightly seasoned clear broth or consume(fat free) Sugar, honey syrup    Complete one Ensure drink the morning of surgery at  7:30 AM  the day of surgery.     Oral Hygiene is also important to reduce your risk of infection.                                    Remember - BRUSH YOUR TEETH THE MORNING OF SURGERY WITH YOUR REGULAR TOOTHPASTE   Do NOT smoke after  Midnight   Take these medicines  the morning of surgery with A SIP OF WATER: Synthroid                               You may not have any metal on your body including hair pins, jewelry, and body piercings             Do not wear make-up, lotions, powders, perfumes/cologne, or deodorant             Do not wear nail polish.  Do not shave  48 hours prior to surgery.          Do not bring valuables to the hospital. [place].   Contacts, dentures or bridgework may not be worn into surgery.   Bring small overnight bag day of surgery.                Please read over the following fact sheets you were given: IF YOU HAVE QUESTIONS ABOUT YOUR PRE OP INSTRUCTIONS PLEASE CALL [phone]   [facility] - Preparing for Surgery Before surgery, you can play an important role.  Because skin is not sterile, your skin needs to be as free of germs as possible.  You can reduce the number of germs on your skin by washing with CHG (chlorahexidine gluconate) soap before surgery.  CHG is an antiseptic cleaner which kills germs and bonds with the skin to continue killing germs even after washing. Please DO NOT use if you have an allergy to CHG or antibacterial soaps.  If your skin becomes reddened/irritated stop using the CHG and inform your nurse when you arrive at Short Stay. Do not shave (including legs and underarms) for at least 48 hours prior to the first CHG shower.  You may shave your face/neck.  Please follow these instructions carefully:  1.  Shower with CHG Soap the night before surgery and the  morning of surgery.  2.  If you choose to wash your hair, wash your hair first as usual with your normal  shampoo.  3.  After you shampoo, rinse your hair and body thoroughly to remove the shampoo.                             4.  Use CHG as you would any other liquid soap.  You can apply chg directly to the skin and wash.  Gently with a scrungie or clean washcloth.  5.   Apply the CHG Soap to your body ONLY FROM THE NECK DOWN.   Do   not use on face/ open                           Wound or open sores. Avoid contact with eyes, ears mouth and   genitals (private parts).                       Wash face,  Genitals (private parts) with your normal soap.             6.  Wash thoroughly, paying special attention to the area where your    surgery  will be performed.  7.  Thoroughly rinse your body with warm water from the neck down.  8.  DO NOT shower/wash with your normal soap after using and rinsing off the CHG  Soap.                9.  Pat yourself dry with a clean towel.            10.  Wear clean pajamas.            11.  Place clean sheets on your bed the night of your first shower and do not  sleep with pets. Day of Surgery : Do not apply any lotions/deodorants the morning of surgery.  Please wear clean clothes to the hospital/surgery center.  FAILURE TO FOLLOW THESE INSTRUCTIONS MAY RESULT IN THE CANCELLATION OF YOUR SURGERY  PATIENT SIGNATURE_________________________________  NURSE SIGNATURE__________________________________  ________________________________________________________________________   Elizabeth Ramirez  An incentive spirometer is a tool that can help keep your lungs clear and active. This tool measures how well you are filling your lungs with each breath. Taking long deep breaths may help reverse or decrease the chance of developing breathing (pulmonary) problems (especially infection) following:  A long period of time when you are unable to move or be active. BEFORE THE PROCEDURE   If the spirometer includes an indicator to show your best effort, your nurse or respiratory therapist will set it to a desired goal.  If possible, sit up straight or lean slightly forward. Try not to slouch.  Hold the incentive spirometer in an upright position. INSTRUCTIONS FOR USE  1. Sit on the edge of your bed if possible, or sit up as far as you can  in bed or on a chair. 2. Hold the incentive spirometer in an upright position. 3. Breathe out normally. 4. Place the mouthpiece in your mouth and seal your lips tightly around it. 5. Breathe in slowly and as deeply as possible, raising the piston or the ball toward the top of the column. 6. Hold your breath for 3-5 seconds or for as long as possible. Allow the piston or ball to fall to the bottom of the column. 7. Remove the mouthpiece from your mouth and breathe out normally. 8. Rest for a few seconds and repeat Steps 1 through 7 at least 10 times every 1-2 hours when you are awake. Take your time and take a few normal breaths between deep breaths. 9. The spirometer may include an indicator to show your best effort. Use the indicator as a goal to work toward during each repetition. 10. After each set of 10 deep breaths, practice coughing to be sure your lungs are clear. If you have an incision (the cut made at the time of surgery), support your incision when coughing by placing a pillow or rolled up towels firmly against it. Once you are able to get out of bed, walk around indoors and cough well. You may stop using the incentive spirometer when instructed by your caregiver.  RISKS AND COMPLICATIONS  Take your time so you do not get dizzy or light-headed.  If you are in pain, you may need to take or ask for pain medication before doing incentive spirometry. It is harder to take a deep breath if you are having pain. AFTER USE  Rest and breathe slowly and easily.  It can be helpful to keep track of a log of your progress. Your caregiver can provide you with a simple table to help with this. If you are using the spirometer at home, follow these instructions: [place] IF:   You are having difficultly using the spirometer.  You have trouble using the spirometer as often as  instructed.  Your pain medication is not giving enough relief while using the spirometer.  You develop fever of  100.5 F (38.1 C) or higher. SEEK IMMEDIATE MEDICAL CARE IF:   You cough up bloody sputum that had not been present before.  You develop fever of 102 F (38.9 C) or greater.  You develop worsening pain at or near the incision site. MAKE SURE YOU:   Understand these instructions.  Will watch your condition.  Will get help right away if you are not doing well or get worse. Document Released: 10/12/2006 Document Revised: 08/24/2011 Document Reviewed: 12/13/2006 Exit[employer] Patient Information 2014 [employer], [place].   ________________________________________________________________________  WHAT IS A BLOOD TRANSFUSION? Blood Transfusion Information  A transfusion is the replacement of blood or some of its parts. Blood is made up of multiple cells which provide different functions.  Red blood cells carry oxygen and are used for blood loss replacement.  White blood cells fight against infection.  Platelets control bleeding.  Plasma helps clot blood.  Other blood products are available for specialized needs, such as hemophilia or other clotting disorders. BEFORE THE TRANSFUSION  Who gives blood for transfusions?   Healthy volunteers who are fully evaluated to make sure their blood is safe. This is blood bank blood. Transfusion therapy is the safest it has ever been in the practice of medicine. Before blood is taken from a donor, a complete history is taken to make sure that person has no history of diseases nor engages in risky social behavior (examples are intravenous drug use or sexual activity with multiple partners). The donor's travel history is screened to minimize risk of transmitting infections, such as malaria. The donated blood is tested for signs of infectious diseases, such as HIV and hepatitis. The blood is then tested to be sure it is compatible with you in order to minimize the chance of a transfusion reaction. If you or a relative donates blood, this is often done in  anticipation of surgery and is not appropriate for emergency situations. It takes many days to process the donated blood. RISKS AND COMPLICATIONS Although transfusion therapy is very safe and saves many lives, the main dangers of transfusion include:   Getting an infectious disease.  Developing a transfusion reaction. This is an allergic reaction to something in the blood you were given. Every precaution is taken to prevent this. The decision to have a blood transfusion has been considered carefully by your caregiver before blood is given. Blood is not given unless the benefits outweigh the risks. AFTER THE TRANSFUSION  Right after receiving a blood transfusion, you will usually feel much better and more energetic. This is especially true if your red blood cells have gotten low (anemic). The transfusion raises the level of the red blood cells which carry oxygen, and this usually causes an energy increase.  The nurse administering the transfusion will monitor you carefully for complications. HOME CARE INSTRUCTIONS  No special instructions are needed after a transfusion. You may find your energy is better. Speak with your caregiver about any limitations on activity for underlying diseases you may have. SEEK MEDICAL CARE IF:   Your condition is not improving after your transfusion.  You develop redness or irritation at the intravenous (IV) site. SEEK IMMEDIATE MEDICAL CARE IF:  Any of the following symptoms occur over the next 12 hours:  Shaking chills.  You have a temperature by mouth above 102 F (38.9 C), not controlled by medicine.  Chest, back, or muscle pain.  People around you feel you are not acting correctly or are confused.  Shortness of breath or difficulty breathing.  Dizziness and fainting.  You get a rash or develop hives.  You have a decrease in urine output.  Your urine turns a dark color or changes to pink, red, or brown. Any of the following symptoms occur over  the next 10 days:  You have a temperature by mouth above 102 F (38.9 C), not controlled by medicine.  Shortness of breath.  Weakness after normal activity.  The white part of the eye turns yellow (jaundice).  You have a decrease in the amount of urine or are urinating less often.  Your urine turns a dark color or changes to pink, red, or brown. Document Released: 05/29/2000 Document Revised: 08/24/2011 Document Reviewed: 01/16/2008 [facility] Patient Information 2014 [place], [place].  _______________________________________________________________________

## 2020-05-13 NOTE — Progress Notes (Addendum)
COVID Vaccine Completed:  x3 Date COVID Vaccine completed:  07-07-19, 07-28-19 & Booster 03-12-20 COVID vaccine manufacturer: Pfizer    Moderna   [employer]   PCP - Cari Caraway, MD Cardiologist - Mertie Moores, MD to evaluate murmur.  No further follow up needed  Chest x-ray - CT chest 01-26-20 in Epic EKG -  Stress Test -  ECHO - 05-04-17 in Epic Cardiac Cath -  Pacemaker/ICD device last checked:  Sleep Study -  CPAP -   Fasting Blood Sugar -  Checks Blood Sugar _____ times a day  Blood Thinner Instructions: Aspirin Instructions: Last Dose:  Anesthesia review:   Patient denies shortness of breath, fever, cough and chest pain at PAT appointment.  Patient able to climb a flight of stairs and perform ADL's without assistance.   Patient verbalized understanding of instructions that were given to them at the PAT appointment. Patient was also instructed that they will need to review over the PAT instructions again at home before surgery.

## 2020-05-14 ENCOUNTER — Other Ambulatory Visit: Payer: Self-pay

## 2020-05-14 ENCOUNTER — Other Ambulatory Visit: Payer: Self-pay | Admitting: *Deleted

## 2020-05-14 ENCOUNTER — Encounter (HOSPITAL_COMMUNITY): Payer: Self-pay

## 2020-05-14 ENCOUNTER — Encounter (HOSPITAL_COMMUNITY)
Admission: RE | Admit: 2020-05-14 | Discharge: 2020-05-14 | Disposition: A | Discharge status: Home / Self Care | Payer: Medicare Other | Source: Ambulatory Visit | Attending: Orthopedic Surgery | Admitting: Orthopedic Surgery

## 2020-05-14 DIAGNOSIS — R918 Other nonspecific abnormal finding of lung field: Secondary | ICD-10-CM

## 2020-05-14 DIAGNOSIS — Z01812 Encounter for preprocedural laboratory examination: Secondary | ICD-10-CM | POA: Diagnosis not present

## 2020-05-14 LAB — CBC
HCT: 39.5 % (ref 36.0–46.0)
Hemoglobin: 13 g/dL (ref 12.0–15.0)
MCH: 30.7 pg (ref 26.0–34.0)
MCHC: 32.9 g/dL (ref 30.0–36.0)
MCV: 93.4 fL (ref 80.0–100.0)
Platelets: 152 10*3/uL (ref 150–400)
RBC: 4.23 MIL/uL (ref 3.87–5.11)
RDW: 14.6 % (ref 11.5–15.5)
WBC: 3 10*3/uL — ABNORMAL LOW (ref 4.0–10.5)
nRBC: 0 % (ref 0.0–0.2)

## 2020-05-14 LAB — COMPREHENSIVE METABOLIC PANEL
ALT: 15 U/L (ref 0–44)
AST: 15 U/L (ref 15–41)
Albumin: 4.1 g/dL (ref 3.5–5.0)
Alkaline Phosphatase: 58 U/L (ref 38–126)
Anion gap: 7 (ref 5–15)
BUN: 21 mg/dL (ref 8–23)
CO2: 26 mmol/L (ref 22–32)
Calcium: 9.2 mg/dL (ref 8.9–10.3)
Chloride: 107 mmol/L (ref 98–111)
Creatinine, Ser: 1.19 mg/dL — ABNORMAL HIGH (ref 0.44–1.00)
GFR, Estimated: 48 mL/min — ABNORMAL LOW (ref >60)
Glucose, Bld: 97 mg/dL (ref 70–99)
Potassium: 4.4 mmol/L (ref 3.5–5.1)
Sodium: 140 mmol/L (ref 135–145)
Total Bilirubin: 1.2 mg/dL (ref 0.3–1.2)
Total Protein: 6.7 g/dL (ref 6.5–8.1)

## 2020-05-14 LAB — PROTIME-INR
INR: 1 (ref 0.8–1.2)
Prothrombin Time: 12.3 seconds (ref 11.4–15.2)

## 2020-05-14 LAB — SURGICAL PCR SCREEN
MRSA, PCR: NEGATIVE
Staphylococcus aureus: NEGATIVE

## 2020-05-14 LAB — APTT: aPTT: 29 seconds (ref 24–36)

## 2020-05-15 ENCOUNTER — Other Ambulatory Visit: Payer: Medicare Other

## 2020-05-15 DIAGNOSIS — R918 Other nonspecific abnormal finding of lung field: Secondary | ICD-10-CM | POA: Diagnosis not present

## 2020-05-16 ENCOUNTER — Other Ambulatory Visit (HOSPITAL_COMMUNITY): Payer: Medicare Other

## 2020-05-17 ENCOUNTER — Other Ambulatory Visit (HOSPITAL_COMMUNITY)
Admission: RE | Admit: 2020-05-17 | Discharge: 2020-05-17 | Disposition: A | Discharge status: Home / Self Care | Payer: Medicare Other | Source: Ambulatory Visit | Attending: Orthopedic Surgery | Admitting: Orthopedic Surgery

## 2020-05-17 DIAGNOSIS — Z20822 Contact with and (suspected) exposure to covid-19: Secondary | ICD-10-CM | POA: Diagnosis not present

## 2020-05-17 DIAGNOSIS — Z01812 Encounter for preprocedural laboratory examination: Secondary | ICD-10-CM | POA: Diagnosis not present

## 2020-05-17 LAB — QUANTIFERON-TB GOLD PLUS
Mitogen-NIL: 7.01 IU/mL
NIL: 0.04 IU/mL
QuantiFERON-TB Gold Plus: NEGATIVE
TB1-NIL: 0.01 IU/mL
TB2-NIL: 0.02 IU/mL

## 2020-05-17 LAB — SARS CORONAVIRUS 2 (TAT 6-24 HRS): SARS Coronavirus 2: NEGATIVE

## 2020-05-17 NOTE — H&P (Signed)
TOTAL KNEE ADMISSION H&P  Patient is being admitted for left total knee arthroplasty.  Subjective:  Chief Complaint: Left knee pain.  HPI: Elizabeth Ramirez, 74 y.o. female has a history of pain and functional disability in the left knee due to arthritis and has failed non-surgical conservative treatments for greater than 12 weeks to include corticosteriod injections, viscosupplementation injections and activity modification. Onset of symptoms was gradual, starting several years ago with gradually worsening course since that time. The patient noted no past surgery on the left knee.  Patient currently rates pain in the left knee at 8 out of 10 with activity. Patient has worsening of pain with activity and weight bearing, pain that interferes with activities of daily living and crepitus. Patient has evidence of bone-on-bone arthritis in the lateral and patellofemoral compartments with valgus deformity by imaging studies. There is no active infection.  Patient Active Problem List   Diagnosis Date Noted  . Lymphadenopathy 02/09/2020  . Adenopathy 02/01/2020  . Nodule of left lung 02/01/2020  . S/P reverse total shoulder arthroplasty, left 07/14/2018  . Obesity, morbid, BMI 40.0-49.9 ([place]) 12/31/2015  . OA (osteoarthritis) of knee 05/27/2015  . 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 Genetic testing 01/18/2015  . Malignant neoplasm of upper-outer quadrant of right breast in female, estrogen receptor positive ([place]) 01/14/2015  . Ear infection 10/19/2013  . Hypothyroid 07/29/2011    Past Medical History:  Diagnosis Date  . Allergy    tetracycline, levofloxacin, crabs & bay leaves  . Arthritis    Rt knee, Rt shoulder  . 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 Cancer ([place])   . Ear infection 10/19/2013  . Genetic testing 01/18/2015   Negative - no mutations found in any of 20 genes on the  Breast/Ovarian Cancer Panel.  Additionally, no variants of uncertain significance (VUSs) were found.  The Breast/Ovarian gene panel offered by [facility] [name], MD) includes sequencing and deletion/duplication analysis for the following 19 genes:  ATM, BARD1, BRCA1, BRCA2, BRIP1, CDH1, CHEK2, FANCC, MLH1, MSH2, MSH6, NBN, PALB2, PMS2, PTEN, RAD51C, RAD51D, TP53, and XRCC2.  This panel also includes deletion/duplication analysis (without sequencing) for one gene, EPCAM.  Date of report is January 14, 2015.    Marland Kitchen Heart murmur   . History of kidney stones    x1 '71  . Hypothyroid 07/29/2011  . Hypothyroidism   . Malignant neoplasm of upper-outer quadrant of right breast in female, estrogen receptor positive ([place]) 01/14/2015  . OA (osteoarthritis) of knee 05/27/2015  . Obesity, morbid, BMI 40.0-49.9 ([place]) 12/31/2015  . Other chronic pain   . Personal history of radiation therapy   . PONV (postoperative nausea and vomiting)    extreme nausea and vomiting - cannot tolerate scopalamine   . 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 Seasonal allergies   . Thyroid disease   . Vitamin D deficiency     Past Surgical History:  Procedure Laterality Date  . APPENDECTOMY  08/23/2006  . BREAST LUMPECTOMY Right 09/2003   DCIS  . BREAST LUMPECTOMY Right 2016  . BREAST LUMPECTOMY WITH NEEDLE LOCALIZATION Right 02/11/2015   Procedure: WIRE BRACKETED RIGHT BREAST LUMPECTOMY ;  Surgeon: Excell Seltzer, MD;  [facility];  Service: General;  Laterality: Right;  . CATARACT EXTRACTION, BILATERAL  10-05-2017 left ;  11-09-2017 right   Dr Kandy Garrison   . colonoscopy with biopsy  12/28/2003   neg  . LYMPH NODE DISSECTION Right 02/23/2020   Procedure: right inguinal lymph node excision;  Surgeon: Alphonsa Overall, MD;  Location: [place];  Service: General;  Laterality: Right;  .  REVERSE SHOULDER ARTHROPLASTY Left 07/14/2018   Procedure: REVERSE SHOULDER ARTHROPLASTY;  Surgeon: Justice Britain, MD;  Location: [facility];  Service: Orthopedics;  Laterality: Left;  Interscalene Block  . TONSILLECTOMY     1954  . TOTAL KNEE ARTHROPLASTY Right 05/27/2015   Procedure: RIGHT TOTAL KNEE ARTHROPLASTY;  Surgeon: Gaynelle Arabian, MD;  Location: [facility];  Service: Orthopedics;  Laterality: Right;  . WISDOM TOOTH EXTRACTION Bilateral 1965    Prior to Admission medications   Medication Sig Start Date End Date Taking? Authorizing Provider  Calcium Carbonate-Vitamin D (CALTRATE 600+D PO) Take 2 tablets by mouth daily.    Yes [provider]  ipratropium (ATROVENT) 0.03 % nasal spray Place 1 spray into both nostrils daily as needed (allergies).    Yes [provider]  levothyroxine (SYNTHROID, LEVOTHROID) 137 MCG tablet Take 137 mcg by mouth daily before breakfast.   Yes [provider]    Allergies  Allergen Reactions  . Levofloxacin Other (See Comments)    Affected tendons, severe pain and swelling  . Tetracyclines & Related Swelling    tongue swelling and peeling  . Other Diarrhea and Rash    Crabs - diarrhea. Bay leaves - rash in her mouth  . Ciprofloxacin     Tendon problems   . Flonase [Fluticasone]     Nasal spray cause nose bleeds  . Ibuprofen     Gets mouth ulcers if she takes for more than 3 days; Aleve okay   . Nitrofurantoin Monohyd Macro Hives  . Scopolamine     "Headache and redness of eyes"    Social History   Socioeconomic History  . Marital status: Soil scientist    Spouse name: Not on file  . Number of children: 0  . Years of education: Not on file  . Highest education level: Not on file  Occupational History  . Not on file  Tobacco Use  . Smoking status: Former Smoker    Packs/day: 1.00    Years: 9.00    Pack years: 9.00    Types: Cigarettes    Quit date: 06/16/1983    Years since quitting: 36.9  . Smokeless tobacco:  Never Used  Vaping Use  . Vaping Use: Never used  Substance and Sexual Activity  . Alcohol use: Yes    Alcohol/week: 2.0 standard drinks    Types: 2 Glasses of wine per week    Comment: Twice a week  . Drug use: No  . Sexual activity: Not on file  Other Topics Concern  . Not on file  Social History Narrative  . Not on file   Social Determinants of Health   Financial Resource Strain:   . Difficulty of Paying Living Expenses: Not on file  Food Insecurity:   . Worried About Charity fundraiser in the Last Year: Not on file  . Ran Out of Food in the Last Year: Not on file  Transportation Needs:   . Lack of Transportation (Medical): Not on file  . Lack of Transportation (Non-Medical): Not on file  Physical Activity:   . Days of Exercise per Week: Not on file  . Minutes of Exercise per Session: Not on  file  Stress:   . Feeling of Stress : Not on file  Social Connections:   . Frequency of Communication with Friends and Family: Not on file  . Frequency of Social Gatherings with Friends and Family: Not on file  . Attends Religious Services: Not on file  . Active Member of Clubs or Organizations: Not on file  . Attends Archivist Meetings: Not on file  . Marital Status: Not on file  Intimate Partner Violence:   . Fear of Current or Ex-Partner: Not on file  . Emotionally Abused: Not on file  . Physically Abused: Not on file  . Sexually Abused: Not on file      Tobacco Use: Medium Risk  . Smoking Tobacco Use: Former Smoker  . Smokeless Tobacco Use: Never Used   Social History   Substance and Sexual Activity  Alcohol Use Yes  . Alcohol/week: 2.0 standard drinks  . Types: 2 Glasses of wine per week   Comment: Twice a week    Family History  Problem Relation Age of Onset  . Dementia Mother   . Other Mother 54       "spot on lung"; secondhand smoke exposure  . Pulmonary fibrosis Father   . Diabetes Brother   . Thyroid disease Brother   . Leukemia Brother 70        chronic lymphocytic   . Stomach cancer Maternal Grandmother        dx. 59s  . Heart attack Maternal Grandfather   . Diabetes Paternal Grandfather   . Stroke Paternal Grandfather   . Colitis Paternal Grandmother   . Parkinson's disease Maternal Aunt   . Diabetes Paternal Aunt   . Other Paternal Aunt        bowel obstruction  . Heart Problems Paternal Uncle   . Ovarian cancer Maternal Aunt        dx. 57s; paternal half sister of her mother  . Multiple myeloma Maternal Aunt   . Multiple myeloma Maternal Aunt 75  . Kidney failure Paternal Aunt   . Pulmonary fibrosis Paternal Aunt   . Stroke Paternal Aunt   . Heart Problems Paternal Aunt   . Emphysema Paternal Uncle   . Heart Problems Paternal Uncle   . Lung cancer Paternal Uncle        smoker  . Colon cancer Cousin   . Breast cancer Cousin        dx. 50s    Review of Systems  Constitutional: Negative for chills and fever.  HENT: Negative for congestion, sore throat and tinnitus.   Eyes: Negative for double vision, photophobia and pain.  Respiratory: Negative for cough, shortness of breath and wheezing.   Cardiovascular: Negative for chest pain, palpitations and orthopnea.  Gastrointestinal: Negative for heartburn, nausea and vomiting.  Genitourinary: Negative for dysuria, frequency and urgency.  Musculoskeletal: Positive for joint pain.  Neurological: Negative for dizziness, weakness and headaches.    Objective:  Physical Exam: Well nourished and well developed.  General: Alert and oriented x3, cooperative and pleasant, no acute distress.  Head: normocephalic, atraumatic, neck supple.  Eyes: EOMI.  Respiratory: breath sounds clear in all fields, no wheezing, rales, or rhonchi. Cardiovascular: Regular rate and rhythm, no murmurs, gallops or rubs.  Abdomen: non-tender to palpation and soft, normoactive bowel sounds. Musculoskeletal:  Left Knee Exam:  Moderate effusion present.  Significant valgus deformity.    The range of motion is: 0 to 125 degrees.  No crepitus on range of motion  of the knee.  Positive lateral greater than medial joint line tenderness.  The knee is stable.   Calves soft and nontender. Motor function intact in LE. Strength 5/5 LE bilaterally. Neuro: Distal pulses 2+. Sensation to light touch intact in LE.  Imaging Review Plain radiographs demonstrate severe degenerative joint disease of the left knee. The overall alignment is neutral. The bone quality appears to be adequate for age and reported activity level.  Assessment/Plan:  End stage arthritis, left knee   The patient history, physical examination, clinical judgment of the provider and imaging studies are consistent with end stage degenerative joint disease of the left knee and total knee arthroplasty is deemed medically necessary. The treatment options including medical management, injection therapy arthroscopy and arthroplasty were discussed at length. The risks and benefits of total knee arthroplasty were presented and reviewed. The risks due to aseptic loosening, infection, stiffness, patella tracking problems, thromboembolic complications and other imponderables were discussed. The patient acknowledged the explanation, agreed to proceed with the plan and consent was signed. Patient is being admitted for inpatient treatment for surgery, pain control, PT, OT, prophylactic antibiotics, VTE prophylaxis, progressive ambulation and ADLs and discharge planning. The patient is planning to be discharged home.   Patient's anticipated LOS is less than 2 midnights, meeting these requirements: - Younger than 33 - Lives within 1 hour of care - Has a competent adult at home to recover with post-op recover - NO history of  - Chronic pain requiring opiods  - Diabetes  - Coronary Artery Disease  - Heart failure  - Heart attack  - Stroke  - DVT/VTE  - Cardiac arrhythmia  - Respiratory Failure/COPD  - Renal failure  - Anemia  -  Advanced Liver disease  Therapy Plans: [facility] Disposition: Her partner [name] Planned DVT Prophylaxis: Xarelto (history of cancer) DME Needed: [facility] Rile PCP: Cari Caraway, MD (clearance received) TXA: IV Allergies: Adhesive (tolerated Aquacel in the past), Butylscopolamine bromide, Ciprofloxacin (tendon pain), Fluticasone, Ibuprofen (ulcer), Quinolones (tendon pain), Tetracycline.  Anesthesia Concerns: Had severe nausea day after her right TKA five years ago.  BMI: 39.1 Last HgbA1c: N/A [facility] [place]  - Patient was instructed on what medications to stop prior to surgery. - Follow-up visit in 2 weeks with Dr. Wynelle Link - Begin physical therapy following surgery - Pre-operative lab work as pre-surgical testing - Prescriptions will be provided in hospital at time of discharge  Theresa Duty, PA-C Orthopedic Surgery [facility] [place]

## 2020-05-19 MED ORDER — BUPIVACAINE LIPOSOME 1.3 % IJ SUSP
20.0000 mL | Freq: Once | INTRAMUSCULAR | Status: DC
  Filled 2020-05-19: qty 20

## 2020-05-19 NOTE — Anesthesia Preprocedure Evaluation (Addendum)
Anesthesia Evaluation  Patient identified by MRN, date of birth, ID band Patient awake    Reviewed: Allergy & Precautions, H&P , NPO status , Patient's Chart, lab work & pertinent test results  History of Anesthesia Complications (+) PONV  Airway Mallampati: III  TM Distance: >3 FB Neck ROM: Full    Dental no notable dental hx. (+) Teeth Intact, Dental Advisory Given   Pulmonary neg pulmonary ROS, former smoker,    Pulmonary exam normal breath sounds clear to auscultation       Cardiovascular Exercise Tolerance: Good negative cardio ROS   Rhythm:Regular Rate:Normal     Neuro/Psych negative neurological ROS  negative psych ROS   GI/Hepatic negative GI ROS, Neg liver ROS,   Endo/Other  Hypothyroidism Morbid obesity  Renal/GU negative Renal ROS  negative genitourinary   Musculoskeletal  (+) Arthritis , Osteoarthritis,    Abdominal   Peds  Hematology negative hematology ROS (+)   Anesthesia Other Findings   Reproductive/Obstetrics negative OB ROS                            Anesthesia Physical Anesthesia Plan  ASA: III  Anesthesia Plan: MAC and Spinal   Post-op Pain Management:  Regional for Post-op pain   Induction: Intravenous  PONV Risk Score and Plan: 4 or greater and Ondansetron, Dexamethasone, Propofol infusion and Diphenhydramine  Airway Management Planned: Simple Face Mask  Additional Equipment:   Intra-op Plan:   Post-operative Plan:   Informed Consent: I have reviewed the patients History and Physical, chart, labs and discussed the procedure including the risks, benefits and alternatives for the proposed anesthesia with the patient or authorized representative who has indicated his/her understanding and acceptance.     Dental advisory given  Plan Discussed with: CRNA  Anesthesia Plan Comments:        Anesthesia Quick Evaluation

## 2020-05-20 ENCOUNTER — Encounter (HOSPITAL_COMMUNITY): Payer: Self-pay | Admitting: Orthopedic Surgery

## 2020-05-20 ENCOUNTER — Other Ambulatory Visit: Payer: Self-pay

## 2020-05-20 ENCOUNTER — Observation Stay (HOSPITAL_COMMUNITY)
Admission: RE | Admit: 2020-05-20 | Discharge: 2020-05-21 | Disposition: A | Discharge status: Home / Self Care | Payer: Medicare Other | Attending: Orthopedic Surgery | Admitting: Orthopedic Surgery

## 2020-05-20 ENCOUNTER — Encounter (HOSPITAL_COMMUNITY)
Admission: RE | Disposition: A | Discharge status: Home / Self Care | Payer: Self-pay | Source: Home / Self Care | Attending: Orthopedic Surgery

## 2020-05-20 ENCOUNTER — Ambulatory Visit (HOSPITAL_COMMUNITY): Payer: Medicare Other | Admitting: Anesthesiology

## 2020-05-20 DIAGNOSIS — Z79899 Other long term (current) drug therapy: Secondary | ICD-10-CM | POA: Diagnosis not present

## 2020-05-20 DIAGNOSIS — G8918 Other acute postprocedural pain: Secondary | ICD-10-CM | POA: Diagnosis not present

## 2020-05-20 DIAGNOSIS — E039 Hypothyroidism, unspecified: Secondary | ICD-10-CM | POA: Diagnosis not present

## 2020-05-20 DIAGNOSIS — Z87891 Personal history of nicotine dependence: Secondary | ICD-10-CM | POA: Diagnosis not present

## 2020-05-20 DIAGNOSIS — Z96651 Presence of right artificial knee joint: Secondary | ICD-10-CM | POA: Diagnosis not present

## 2020-05-20 DIAGNOSIS — M1712 Unilateral primary osteoarthritis, left knee: Secondary | ICD-10-CM | POA: Diagnosis not present

## 2020-05-20 DIAGNOSIS — M25562 Pain in left knee: Principal | ICD-10-CM | POA: Insufficient documentation

## 2020-05-20 DIAGNOSIS — M1990 Unspecified osteoarthritis, unspecified site: Secondary | ICD-10-CM | POA: Insufficient documentation

## 2020-05-20 DIAGNOSIS — Z853 Personal history of malignant neoplasm of breast: Secondary | ICD-10-CM | POA: Insufficient documentation

## 2020-05-20 DIAGNOSIS — E559 Vitamin D deficiency, unspecified: Secondary | ICD-10-CM | POA: Diagnosis not present

## 2020-05-20 HISTORY — PX: TOTAL KNEE ARTHROPLASTY: SHX125

## 2020-05-20 LAB — TYPE AND SCREEN
ABO/RH(D): O NEG
Antibody Screen: NEGATIVE

## 2020-05-20 SURGERY — ARTHROPLASTY, KNEE, TOTAL
Anesthesia: Regional | Site: Knee | Laterality: Left

## 2020-05-20 MED ORDER — LIDOCAINE HCL (PF) 2 % IJ SOLN
INTRAMUSCULAR | Status: AC
  Filled 2020-05-20: qty 5

## 2020-05-20 MED ORDER — PROPOFOL 10 MG/ML IV BOLUS
INTRAVENOUS | Status: AC
  Filled 2020-05-20: qty 20

## 2020-05-20 MED ORDER — BISACODYL 10 MG RE SUPP: 10.0000 mg | Freq: Every day | RECTAL | Status: DC | PRN

## 2020-05-20 MED ORDER — HYDROMORPHONE HCL 1 MG/ML IJ SOLN
0.2500 mg | INTRAMUSCULAR | Status: DC | PRN
  Administered 2020-05-20 (×4): 0.5 mg via INTRAVENOUS

## 2020-05-20 MED ORDER — PROPOFOL 10 MG/ML IV BOLUS
INTRAVENOUS | Status: DC | PRN
  Administered 2020-05-20: 150 mg via INTRAVENOUS

## 2020-05-20 MED ORDER — KETAMINE HCL 10 MG/ML IJ SOLN
INTRAMUSCULAR | Status: AC
  Filled 2020-05-20: qty 1

## 2020-05-20 MED ORDER — METOCLOPRAMIDE HCL 5 MG PO TABS: 5.0000 mg | ORAL_TABLET | Freq: Three times a day (TID) | ORAL | Status: DC | PRN

## 2020-05-20 MED ORDER — ORAL CARE MOUTH RINSE: 15.0000 mL | Freq: Once | OROMUCOSAL | Status: AC

## 2020-05-20 MED ORDER — SODIUM CHLORIDE (PF) 0.9 % IJ SOLN
INTRAMUSCULAR | Status: AC
  Filled 2020-05-20: qty 10

## 2020-05-20 MED ORDER — PROPOFOL 1000 MG/100ML IV EMUL
INTRAVENOUS | Status: AC
  Filled 2020-05-20: qty 100

## 2020-05-20 MED ORDER — CEFAZOLIN SODIUM-DEXTROSE 2-4 GM/100ML-% IV SOLN
2.0000 g | INTRAVENOUS | Status: AC
  Administered 2020-05-20: 2 g via INTRAVENOUS
  Filled 2020-05-20: qty 100

## 2020-05-20 MED ORDER — DEXAMETHASONE SODIUM PHOSPHATE 10 MG/ML IJ SOLN
INTRAMUSCULAR | Status: AC
  Filled 2020-05-20: qty 1

## 2020-05-20 MED ORDER — DEXAMETHASONE SODIUM PHOSPHATE 10 MG/ML IJ SOLN
8.0000 mg | Freq: Once | INTRAMUSCULAR | Status: AC
  Administered 2020-05-20: 8 mg via INTRAVENOUS

## 2020-05-20 MED ORDER — GABAPENTIN 300 MG PO CAPS
300.0000 mg | ORAL_CAPSULE | Freq: Three times a day (TID) | ORAL | Status: DC
  Administered 2020-05-20 – 2020-05-21 (×3): 300 mg via ORAL
  Filled 2020-05-20 (×3): qty 1

## 2020-05-20 MED ORDER — AMISULPRIDE (ANTIEMETIC) 5 MG/2ML IV SOLN
INTRAVENOUS | Status: AC
  Administered 2020-05-20: 10 mg
  Filled 2020-05-20: qty 4

## 2020-05-20 MED ORDER — DEXAMETHASONE SODIUM PHOSPHATE 10 MG/ML IJ SOLN
10.0000 mg | Freq: Once | INTRAMUSCULAR | Status: AC
  Administered 2020-05-21: 10 mg via INTRAVENOUS
  Filled 2020-05-20: qty 1

## 2020-05-20 MED ORDER — METOCLOPRAMIDE HCL 5 MG/ML IJ SOLN: 5.0000 mg | Freq: Three times a day (TID) | INTRAMUSCULAR | Status: DC | PRN

## 2020-05-20 MED ORDER — BUPIVACAINE LIPOSOME 1.3 % IJ SUSP
INTRAMUSCULAR | Status: DC | PRN
  Administered 2020-05-20: 20 mL

## 2020-05-20 MED ORDER — MORPHINE SULFATE (PF) 4 MG/ML IV SOLN
0.5000 mg | INTRAVENOUS | Status: DC | PRN
  Administered 2020-05-20: 1 mg via INTRAVENOUS
  Filled 2020-05-20: qty 1

## 2020-05-20 MED ORDER — LIDOCAINE HCL (CARDIAC) PF 100 MG/5ML IV SOSY
PREFILLED_SYRINGE | INTRAVENOUS | Status: DC | PRN
  Administered 2020-05-20: 50 mg via INTRAVENOUS

## 2020-05-20 MED ORDER — ACETAMINOPHEN 500 MG PO TABS
1000.0000 mg | ORAL_TABLET | Freq: Four times a day (QID) | ORAL | Status: DC
  Administered 2020-05-20 – 2020-05-21 (×2): 1000 mg via ORAL
  Filled 2020-05-20 (×3): qty 2

## 2020-05-20 MED ORDER — BUPIVACAINE-EPINEPHRINE (PF) 0.5% -1:200000 IJ SOLN
INTRAMUSCULAR | Status: DC | PRN
  Administered 2020-05-20: 20 mL via PERINEURAL

## 2020-05-20 MED ORDER — METHOCARBAMOL 500 MG IVPB - SIMPLE MED
500.0000 mg | Freq: Four times a day (QID) | INTRAVENOUS | Status: DC | PRN
  Administered 2020-05-20: 500 mg via INTRAVENOUS
  Filled 2020-05-20: qty 50

## 2020-05-20 MED ORDER — 0.9 % SODIUM CHLORIDE (POUR BTL) OPTIME
TOPICAL | Status: DC | PRN
  Administered 2020-05-20: 1000 mL

## 2020-05-20 MED ORDER — ONDANSETRON HCL 4 MG/2ML IJ SOLN
4.0000 mg | Freq: Four times a day (QID) | INTRAMUSCULAR | Status: DC | PRN
  Administered 2020-05-20: 4 mg via INTRAVENOUS
  Filled 2020-05-20: qty 2

## 2020-05-20 MED ORDER — LEVOTHYROXINE SODIUM 137 MCG PO TABS
137.0000 ug | ORAL_TABLET | Freq: Every day | ORAL | Status: DC
  Administered 2020-05-21: 137 ug via ORAL
  Filled 2020-05-20: qty 1

## 2020-05-20 MED ORDER — CEFAZOLIN SODIUM-DEXTROSE 2-4 GM/100ML-% IV SOLN
2.0000 g | Freq: Four times a day (QID) | INTRAVENOUS | Status: AC
  Administered 2020-05-20 (×2): 2 g via INTRAVENOUS
  Filled 2020-05-20 (×2): qty 100

## 2020-05-20 MED ORDER — MENTHOL 3 MG MT LOZG: 1.0000 | LOZENGE | OROMUCOSAL | Status: DC | PRN

## 2020-05-20 MED ORDER — LACTATED RINGERS IV SOLN: INTRAVENOUS | Status: DC

## 2020-05-20 MED ORDER — POVIDONE-IODINE 10 % EX SWAB
2.0000 "application " | Freq: Once | CUTANEOUS | Status: AC
  Administered 2020-05-20: 2 via TOPICAL

## 2020-05-20 MED ORDER — FENTANYL CITRATE (PF) 100 MCG/2ML IJ SOLN
50.0000 ug | INTRAMUSCULAR | Status: DC
  Filled 2020-05-20: qty 2

## 2020-05-20 MED ORDER — ONDANSETRON HCL 4 MG/2ML IJ SOLN
INTRAMUSCULAR | Status: AC
  Filled 2020-05-20: qty 2

## 2020-05-20 MED ORDER — ONDANSETRON HCL 4 MG/2ML IJ SOLN
INTRAMUSCULAR | Status: DC | PRN
  Administered 2020-05-20: 4 mg via INTRAVENOUS

## 2020-05-20 MED ORDER — HYDROMORPHONE HCL 1 MG/ML IJ SOLN
INTRAMUSCULAR | Status: AC
  Filled 2020-05-20: qty 1

## 2020-05-20 MED ORDER — ACETAMINOPHEN 10 MG/ML IV SOLN
1000.0000 mg | Freq: Four times a day (QID) | INTRAVENOUS | Status: DC
  Administered 2020-05-20: 1000 mg via INTRAVENOUS
  Filled 2020-05-20: qty 100

## 2020-05-20 MED ORDER — PROPOFOL 500 MG/50ML IV EMUL
INTRAVENOUS | Status: DC | PRN
  Administered 2020-05-20: 50 ug/kg/min via INTRAVENOUS

## 2020-05-20 MED ORDER — PHENOL 1.4 % MT LIQD: 1.0000 | OROMUCOSAL | Status: DC | PRN

## 2020-05-20 MED ORDER — PROPOFOL 1000 MG/100ML IV EMUL
INTRAVENOUS | Status: AC
  Filled 2020-05-20: qty 300

## 2020-05-20 MED ORDER — ONDANSETRON HCL 4 MG PO TABS
4.0000 mg | ORAL_TABLET | Freq: Four times a day (QID) | ORAL | Status: DC | PRN
  Administered 2020-05-21: 4 mg via ORAL
  Filled 2020-05-20: qty 1

## 2020-05-20 MED ORDER — METHOCARBAMOL 500 MG PO TABS
500.0000 mg | ORAL_TABLET | Freq: Four times a day (QID) | ORAL | Status: DC | PRN
  Administered 2020-05-20 – 2020-05-21 (×3): 500 mg via ORAL
  Filled 2020-05-20 (×3): qty 1

## 2020-05-20 MED ORDER — SODIUM CHLORIDE (PF) 0.9 % IJ SOLN
INTRAMUSCULAR | Status: AC
  Filled 2020-05-20: qty 50

## 2020-05-20 MED ORDER — CHLORHEXIDINE GLUCONATE 0.12 % MT SOLN
15.0000 mL | Freq: Once | OROMUCOSAL | Status: AC
  Administered 2020-05-20: 15 mL via OROMUCOSAL

## 2020-05-20 MED ORDER — SODIUM CHLORIDE 0.9 % IR SOLN
Status: DC | PRN
  Administered 2020-05-20: 1000 mL

## 2020-05-20 MED ORDER — METHOCARBAMOL 500 MG IVPB - SIMPLE MED
INTRAVENOUS | Status: AC
  Filled 2020-05-20: qty 50

## 2020-05-20 MED ORDER — AMISULPRIDE (ANTIEMETIC) 5 MG/2ML IV SOLN: 10.0000 mg | Freq: Once | INTRAVENOUS | Status: DC

## 2020-05-20 MED ORDER — FLEET ENEMA 7-19 GM/118ML RE ENEM: 1.0000 | ENEMA | Freq: Once | RECTAL | Status: DC | PRN

## 2020-05-20 MED ORDER — POLYETHYLENE GLYCOL 3350 17 G PO PACK: 17.0000 g | PACK | Freq: Every day | ORAL | Status: DC | PRN

## 2020-05-20 MED ORDER — MIDAZOLAM HCL 2 MG/2ML IJ SOLN
1.0000 mg | INTRAMUSCULAR | Status: DC
  Administered 2020-05-20: 1 mg via INTRAVENOUS
  Filled 2020-05-20: qty 2

## 2020-05-20 MED ORDER — DOCUSATE SODIUM 100 MG PO CAPS
100.0000 mg | ORAL_CAPSULE | Freq: Two times a day (BID) | ORAL | Status: DC
  Administered 2020-05-20 – 2020-05-21 (×2): 100 mg via ORAL
  Filled 2020-05-20 (×2): qty 1

## 2020-05-20 MED ORDER — RIVAROXABAN 10 MG PO TABS
10.0000 mg | ORAL_TABLET | Freq: Every day | ORAL | Status: DC
  Administered 2020-05-21: 10 mg via ORAL
  Filled 2020-05-20: qty 1

## 2020-05-20 MED ORDER — PHENYLEPHRINE HCL-NACL 10-0.9 MG/250ML-% IV SOLN
INTRAVENOUS | Status: DC | PRN
  Administered 2020-05-20: 25 ug/min via INTRAVENOUS

## 2020-05-20 MED ORDER — SODIUM CHLORIDE 0.9 % IV SOLN: INTRAVENOUS | Status: DC

## 2020-05-20 MED ORDER — SODIUM CHLORIDE (PF) 0.9 % IJ SOLN
INTRAMUSCULAR | Status: DC | PRN
  Administered 2020-05-20: 60 mL

## 2020-05-20 MED ORDER — KETAMINE HCL 10 MG/ML IJ SOLN
INTRAMUSCULAR | Status: DC | PRN
  Administered 2020-05-20: 50 mg via INTRAVENOUS

## 2020-05-20 MED ORDER — TRANEXAMIC ACID-NACL 1000-0.7 MG/100ML-% IV SOLN
1000.0000 mg | INTRAVENOUS | Status: AC
  Administered 2020-05-20: 1000 mg via INTRAVENOUS
  Filled 2020-05-20: qty 100

## 2020-05-20 MED ORDER — STERILE WATER FOR IRRIGATION IR SOLN
Status: DC | PRN
  Administered 2020-05-20: 2000 mL

## 2020-05-20 MED ORDER — OXYCODONE HCL 5 MG PO TABS
5.0000 mg | ORAL_TABLET | ORAL | Status: DC | PRN
  Administered 2020-05-21: 10 mg via ORAL
  Administered 2020-05-21: 5 mg via ORAL
  Filled 2020-05-20: qty 2
  Filled 2020-05-20: qty 1

## 2020-05-20 MED ORDER — DIPHENHYDRAMINE HCL 12.5 MG/5ML PO ELIX: 12.5000 mg | ORAL_SOLUTION | ORAL | Status: DC | PRN

## 2020-05-20 MED ORDER — TRAMADOL HCL 50 MG PO TABS
50.0000 mg | ORAL_TABLET | Freq: Four times a day (QID) | ORAL | Status: DC | PRN
  Administered 2020-05-20: 100 mg via ORAL
  Filled 2020-05-20: qty 2

## 2020-05-20 SURGICAL SUPPLY — 55 items
ATTUNE MED DOME PAT 38 KNEE (Knees) ×2 IMPLANT
ATTUNE PS FEM LT SZ 7 CEM KNEE (Femur) ×2 IMPLANT
ATTUNE PSRP INSR SZ7 10 KNEE (Insert) ×2 IMPLANT
BAG ZIPLOCK 12X15 (MISCELLANEOUS) ×2 IMPLANT
BASE TIBIAL ROT PLAT SZ 7 KNEE (Knees) ×1 IMPLANT
BLADE SAG 18X100X1.27 (BLADE) ×2 IMPLANT
BLADE SAW SGTL 11.0X1.19X90.0M (BLADE) ×2 IMPLANT
BLADE SURG SZ10 CARB STEEL (BLADE) ×4 IMPLANT
BNDG ELASTIC 6X5.8 VLCR STR LF (GAUZE/BANDAGES/DRESSINGS) ×2 IMPLANT
BOWL SMART MIX CTS (DISPOSABLE) ×2 IMPLANT
CEMENT HV SMART SET (Cement) ×4 IMPLANT
COVER SURGICAL LIGHT HANDLE (MISCELLANEOUS) ×2 IMPLANT
COVER WAND RF STERILE (DRAPES) IMPLANT
CUFF TOURN SGL QUICK 34 (TOURNIQUET CUFF) ×2
CUFF TRNQT CYL 34X4.125X (TOURNIQUET CUFF) ×1 IMPLANT
DECANTER SPIKE VIAL GLASS SM (MISCELLANEOUS) ×2 IMPLANT
DRAPE U-SHAPE 47X51 STRL (DRAPES) ×2 IMPLANT
DRESSING AQUACEL AG SP 3.5X10 (GAUZE/BANDAGES/DRESSINGS) ×1 IMPLANT
DRSG AQUACEL AG ADV 3.5X10 (GAUZE/BANDAGES/DRESSINGS) ×2 IMPLANT
DRSG AQUACEL AG SP 3.5X10 (GAUZE/BANDAGES/DRESSINGS) ×2
DURAPREP 26ML APPLICATOR (WOUND CARE) ×2 IMPLANT
ELECT REM PT RETURN 15FT ADLT (MISCELLANEOUS) ×2 IMPLANT
GLOVE BIO SURGEON STRL SZ7 (GLOVE) ×2 IMPLANT
GLOVE BIO SURGEON STRL SZ8 (GLOVE) ×2 IMPLANT
GLOVE BIOGEL PI IND STRL 7.0 (GLOVE) ×1 IMPLANT
GLOVE BIOGEL PI IND STRL 8 (GLOVE) ×1 IMPLANT
GLOVE BIOGEL PI INDICATOR 7.0 (GLOVE) ×1
GLOVE BIOGEL PI INDICATOR 8 (GLOVE) ×1
GOWN STRL REUS W/TWL LRG LVL3 (GOWN DISPOSABLE) ×4 IMPLANT
HANDPIECE INTERPULSE COAX TIP (DISPOSABLE) ×2
IMMOBILIZER KNEE 20 (SOFTGOODS) ×2
IMMOBILIZER KNEE 20 THIGH 36 (SOFTGOODS) ×1 IMPLANT
IMMOBILIZER KNEE 22 UNIV (SOFTGOODS) ×2 IMPLANT
KIT TURNOVER KIT A (KITS) IMPLANT
MANIFOLD NEPTUNE II (INSTRUMENTS) ×2 IMPLANT
NS IRRIG 1000ML POUR BTL (IV SOLUTION) ×2 IMPLANT
PACK TOTAL KNEE CUSTOM (KITS) ×2 IMPLANT
PADDING CAST COTTON 6X4 STRL (CAST SUPPLIES) ×2 IMPLANT
PENCIL SMOKE EVACUATOR (MISCELLANEOUS) ×2 IMPLANT
PIN DRILL FIX HALF THREAD (BIT) ×2 IMPLANT
PIN STEINMAN FIXATION KNEE (PIN) ×2 IMPLANT
PROTECTOR NERVE ULNAR (MISCELLANEOUS) ×2 IMPLANT
SET HNDPC FAN SPRY TIP SCT (DISPOSABLE) ×1 IMPLANT
STRIP CLOSURE SKIN 1/2X4 (GAUZE/BANDAGES/DRESSINGS) ×4 IMPLANT
SUT MNCRL AB 3-0 PS2 18 (SUTURE) ×2 IMPLANT
SUT MNCRL AB 4-0 PS2 18 (SUTURE) ×2 IMPLANT
SUT STRATAFIX 0 PDS 27 VIOLET (SUTURE) ×4
SUT VIC AB 2-0 CT1 27 (SUTURE) ×6
SUT VIC AB 2-0 CT1 TAPERPNT 27 (SUTURE) ×3 IMPLANT
SUTURE STRATFX 0 PDS 27 VIOLET (SUTURE) ×2 IMPLANT
SYR BULB IRRIG 60ML STRL (SYRINGE) ×2 IMPLANT
TIBIAL BASE ROT PLAT SZ 7 KNEE (Knees) ×2 IMPLANT
WATER STERILE IRR 1000ML POUR (IV SOLUTION) ×4 IMPLANT
WRAP KNEE MAXI GEL POST OP (GAUZE/BANDAGES/DRESSINGS) ×2 IMPLANT
YANKAUER SUCT BULB TIP NO VENT (SUCTIONS) ×2 IMPLANT

## 2020-05-20 NOTE — Progress Notes (Signed)
Assisted Dr. Edmond Fitzgerald with left, ultrasound guided, adductor canal block. Side rails up, monitors on throughout procedure. See vital signs in flow sheet. Tolerated Procedure well. 

## 2020-05-20 NOTE — Discharge Instructions (Addendum)
° °Frank [name], MD °Total Joint Specialist °[facility] °[address] °, [place] [address] °[phone] ° °TOTAL KNEE REPLACEMENT POSTOPERATIVE DIRECTIONS ° ° ° °Knee Rehabilitation, Guidelines Following Surgery  °Results after knee surgery are often greatly improved when you follow the exercise, range of motion and muscle strengthening exercises prescribed by your doctor. Safety measures are also important to protect the knee from further injury. If any of these exercises cause you to have increased pain or swelling in your knee joint, decrease the amount until you are comfortable again and slowly increase them. If you have problems or questions, call your caregiver or physical therapist for advice.  ° °BLOOD CLOT PREVENTION °• Take a 10 mg Xarelto once a day for three weeks following surgery. Then take an 81 mg Aspirin once a day for three weeks. Then discontinue Aspirin. °• You may resume your vitamins/supplements once you have discontinued the Xarelto. °• Do not take any NSAIDs (Advil, Aleve, Ibuprofen, Meloxicam, etc.) until you have discontinued the Xarelto.  ° °HOME CARE INSTRUCTIONS  °• Remove items at home which could result in a fall. This includes throw rugs or furniture in walking pathways.  °• ICE to the affected knee as much as tolerated. Icing helps control swelling. If the swelling is well controlled you will be more comfortable and rehab easier. Continue to use ice on the knee for pain and swelling from surgery. You may notice swelling that will progress down to the foot and ankle. This is normal after surgery. Elevate the leg when you are not up walking on it.    °• Continue to use the breathing machine which will help keep your temperature down. It is common for your temperature to cycle up and down following surgery, especially at night when you are not up moving around and exerting yourself. The breathing machine keeps your lungs expanded and your  temperature down. °• Do not place pillow under the operative knee, focus on keeping the knee straight while resting ° °DIET °You may resume your previous home diet once you are discharged from the hospital. ° °DRESSING / WOUND CARE / SHOWERING °• Keep your bulky bandage on for 2 days. On the third post-operative day you may remove the Ace bandage and gauze. There is a waterproof adhesive bandage on your skin which will stay in place until your first follow-up appointment. Once you remove this you will not need to place another bandage °• You may begin showering 3 days following surgery, but do not submerge the incision under water. ° °ACTIVITY °For the first 5 days, the key is rest and control of pain and swelling °• Do your home exercises twice a day starting on post-operative day 3. On the days you go to physical therapy, just do the home exercises once that day. °• You should rest, ice and elevate the leg for 50 minutes out of every hour. Get up and walk/stretch for 10 minutes per hour. After 5 days you can increase your activity slowly as tolerated. °• Walk with your walker as instructed. Use the walker until you are comfortable transitioning to a cane. Walk with the cane in the opposite hand of the operative leg. You may discontinue the cane once you are comfortable and walking steadily. °• Avoid periods of inactivity such as sitting longer than an hour when not asleep. This helps prevent blood clots.  °• You may discontinue the knee immobilizer once you are able to perform a straight leg   raise while lying down.  You may resume a sexual relationship in one month or when given the OK by your doctor.   You may return to work once you are cleared by your doctor.   Do not drive a car for 6 weeks or until released by your surgeon.   Do not drive while taking narcotics.  TED HOSE STOCKINGS Wear the elastic stockings on both legs for three weeks following surgery during the day. You may remove them at night  for sleeping.  WEIGHT BEARING Weight bearing as tolerated with assist device (walker, cane, etc) as directed, use it as long as suggested by your surgeon or therapist, typically at least 4-6 weeks.  POSTOPERATIVE CONSTIPATION PROTOCOL Constipation - defined medically as fewer than three stools per week and severe constipation as less than one stool per week.  One of the most common issues patients have following surgery is constipation.  Even if you have a regular bowel pattern at home, your normal regimen is likely to be disrupted due to multiple reasons following surgery.  Combination of anesthesia, postoperative narcotics, change in appetite and fluid intake all can affect your bowels.  In order to avoid complications following surgery, here are some recommendations in order to help you during your recovery period.   Colace (docusate) - Pick up an over-the-counter form of Colace or another stool softener and take twice a day as long as you are requiring postoperative pain medications.  Take with a full glass of water daily.  If you experience loose stools or diarrhea, hold the colace until you stool forms back up. If your symptoms do not get better within 1 week or if they get worse, check with your doctor.  Dulcolax (bisacodyl) - Pick up over-the-counter and take as directed by the product packaging as needed to assist with the movement of your bowels.  Take with a full glass of water.  Use this product as needed if not relieved by Colace only.   MiraLax (polyethylene glycol) - Pick up over-the-counter to have on hand. MiraLax is a solution that will increase the amount of water in your bowels to assist with bowel movements.  Take as directed and can mix with a glass of water, juice, soda, coffee, or tea. Take if you go more than two days without a movement. Do not use MiraLax more than once per day. Call your doctor if you are still constipated or irregular after using this medication for 7 days  in a row.  If you continue to have problems with postoperative constipation, please contact the office for further assistance and recommendations.  If you experience "the worst abdominal pain ever" or develop nausea or vomiting, please contact the office immediatly for further recommendations for treatment.  ITCHING If you experience itching with your medications, try taking only a single pain pill, or even half a pain pill at a time.  You can also use Benadryl over the counter for itching or also to help with sleep.   MEDICATIONS See your medication summary on the After Visit Summary that the nursing staff will review with you prior to discharge.  You may have some home medications which will be placed on hold until you complete the course of blood thinner medication.  It is important for you to complete the blood thinner medication as prescribed by your surgeon.  Continue your approved medications as instructed at time of discharge.  PRECAUTIONS  If you experience chest pain or shortness of breath -  call 911 immediately for transfer to the hospital emergency department.   If you develop a fever greater that 101 F, purulent drainage from wound, increased redness or drainage from wound, foul odor from the wound/dressing, or calf pain - CONTACT YOUR SURGEON.                                                   FOLLOW-UP APPOINTMENTS Make sure you keep all of your appointments after your operation with your surgeon and caregivers. You should call the office at the above phone number and make an appointment for approximately two weeks after the date of your surgery or on the date instructed by your surgeon outlined in the "After Visit Summary".  RANGE OF MOTION AND STRENGTHENING EXERCISES  Rehabilitation of the knee is important following a knee injury or an operation. After just a few days of immobilization, the muscles of the thigh which control the knee become weakened and shrink (atrophy). Knee  exercises are designed to build up the tone and strength of the thigh muscles and to improve knee motion. Often times heat used for twenty to thirty minutes before working out will loosen up your tissues and help with improving the range of motion but do not use heat for the first two weeks following surgery. These exercises can be done on a training (exercise) mat, on the floor, on a table or on a bed. Use what ever works the best and is most comfortable for you Knee exercises include:   Leg Lifts - While your knee is still immobilized in a splint or cast, you can do straight leg raises. Lift the leg to 60 degrees, hold for 3 sec, and slowly lower the leg. Repeat 10-20 times 2-3 times daily. Perform this exercise against resistance later as your knee gets better.   Quad and Hamstring Sets - Tighten up the muscle on the front of the thigh (Quad) and hold for 5-10 sec. Repeat this 10-20 times hourly. Hamstring sets are done by pushing the foot backward against an object and holding for 5-10 sec. Repeat as with quad sets.   Leg Slides: Lying on your back, slowly slide your foot toward your buttocks, bending your knee up off the floor (only go as far as is comfortable). Then slowly slide your foot back down until your leg is flat on the floor again.  Angel Wings: Lying on your back spread your legs to the side as far apart as you can without causing discomfort.  A rehabilitation program following serious knee injuries can speed recovery and prevent re-injury in the future due to weakened muscles. Contact your doctor or a physical therapist for more information on knee rehabilitation.   IF YOU ARE TRANSFERRED TO A SKILLED REHAB FACILITY If the patient is transferred to a skilled rehab facility following release from the hospital, a list of the current medications will be sent to the facility for the patient to continue.  When discharged from the skilled rehab facility, please have the facility set up the  patient's [place] prior to being released. Also, the skilled facility will be responsible for providing the patient with their medications at time of release from the facility to include their pain medication, the muscle relaxants, and their blood thinner medication. If the patient is still at the rehab facility  at time of the two week follow up appointment, the skilled rehab facility will also need to assist the patient in arranging follow up appointment in our office and any transportation needs.  MAKE SURE YOU:   Understand these instructions.   Get help right away if you are not doing well or get worse.   DENTAL ANTIBIOTICS:  In most cases prophylactic antibiotics for Dental procdeures after total joint surgery are not necessary.  Exceptions are as follows:  1. History of prior total joint infection  2. Severely immunocompromised (Organ Transplant, cancer chemotherapy, Rheumatoid biologic meds such as [place] [facility])  3. Poorly controlled diabetes (A1C &gt; 8.0, blood glucose over 200)  If you have one of these conditions, contact your surgeon for an antibiotic prescription, prior to your dental procedure.    Pick up stool softner and laxative for home use following surgery while on pain medications. Do not submerge incision under water. Please use good hand washing techniques while changing dressing each day. May shower starting three days after surgery. Please use a clean towel to pat the incision dry following showers. Continue to use ice for pain and swelling after surgery. Do not use any lotions or creams on the incision until instructed by your surgeon. ___________________________________________________  Information on my medicine - XARELTO (Rivaroxaban)  This medication education was reviewed with me or my healthcare representative as part of my discharge preparation.    Why was Xarelto prescribed for you? Xarelto was prescribed for you to reduce the  risk of blood clots forming after orthopedic surgery. The medical term for these abnormal blood clots is venous thromboembolism (VTE).  What do you need to know about xarelto ? Take your Xarelto ONCE DAILY at the same time every day. You may take it either with or without food.  If you have difficulty swallowing the tablet whole, you may crush it and mix in applesauce just prior to taking your dose.  Take Xarelto exactly as prescribed by your doctor and DO NOT stop taking Xarelto without talking to the doctor who prescribed the medication.  Stopping without other VTE prevention medication to take the place of Xarelto may increase your risk of developing a clot.  After discharge, you should have regular check-up appointments with your healthcare provider that is prescribing your Xarelto.    What do you do if you miss a dose? If you miss a dose, take it as soon as you remember on the same day then continue your regularly scheduled once daily regimen the next day. Do not take two doses of Xarelto on the same day.   Important Safety Information A possible side effect of Xarelto is bleeding. You should call your healthcare provider right away if you experience any of the following: ? Bleeding from an injury or your nose that does not stop. ? Unusual colored urine (red or dark brown) or unusual colored stools (red or black). ? Unusual bruising for unknown reasons. ? A serious fall or if you hit your head (even if there is no bleeding).  Some medicines may interact with Xarelto and might increase your risk of bleeding while on Xarelto. To help avoid this, consult your healthcare provider or pharmacist prior to using any new prescription or non-prescription medications, including herbals, vitamins, non-steroidal anti-inflammatory drugs (NSAIDs) and supplements.  This website has more information on Xarelto: [email or web address]

## 2020-05-20 NOTE — Anesthesia Postprocedure Evaluation (Signed)
Anesthesia Post Note  Patient: SHAQUINTA PERUSKI  Procedure(s) Performed: TOTAL KNEE ARTHROPLASTY (Left Knee)     Patient location during evaluation: PACU Anesthesia Type: Regional and General Level of consciousness: awake and alert Pain management: pain level controlled Vital Signs Assessment: post-procedure vital signs reviewed and stable Respiratory status: spontaneous breathing, nonlabored ventilation, respiratory function stable and patient connected to nasal cannula oxygen Cardiovascular status: blood pressure returned to baseline and stable Postop Assessment: no apparent nausea or vomiting and patient able to bend at knees Anesthetic complications: no   No complications documented.  Last Vitals:  Vitals:   05/20/20 1357 05/20/20 1400  BP:  (!) 152/76  Pulse: 78 81  Resp: 14 13  Temp: 36.5 C   SpO2: 100% 100%    Last Pain:  Vitals:   05/20/20 1400  TempSrc:   PainSc: 7                  Danica Camarena,W. EDMOND

## 2020-05-20 NOTE — Care Plan (Signed)
Ortho Bundle Case Management Note  Patient Details  Name: Elizabeth Ramirez MRN: [id number] Date of Birth: 1945-07-31  L TKA on 05-20-20 DCP:  Home with partner, [name].  2 story home with 3 ste. DME:  RW and 3-in-1 ordered through [place] PT:  [facility].  PT eval scheduld on 05-24-20.                   DME Arranged:  Walker rolling, 3-N-1 DME Agency:  [employer]  HH Arranged:  NA HH Agency:  NA  Additional Comments: Please contact me with any questions of if this plan should need to change.  Marianne Sofia, RN,CCM [facility]  [phone] 05/20/2020, 4:52 PM

## 2020-05-20 NOTE — Transfer of Care (Signed)
Immediate Anesthesia Transfer of Care Note  Patient: Elizabeth Ramirez  Procedure(s) Performed: TOTAL KNEE ARTHROPLASTY (Left Knee)  Patient Location: PACU  Anesthesia Type:General  Level of Consciousness: awake, alert , oriented and patient cooperative  Airway & Oxygen Therapy: Patient Spontanous Breathing and Patient connected to face mask oxygen  Post-op Assessment: Report given to RN, Post -op Vital signs reviewed and stable and Patient moving all extremities  Post vital signs: Reviewed and stable  Last Vitals:  Vitals Value Taken Time  BP 111/67 05/20/20 1243  Temp    Pulse 72 05/20/20 1244  Resp 19 05/20/20 1244  SpO2 96 % 05/20/20 1244  Vitals shown include unvalidated device data.  Last Pain:  Vitals:   05/20/20 0839  TempSrc:   PainSc: 6       Patients Stated Pain Goal: 5 (78/[id number]/96 2952)  Complications: No complications documented.

## 2020-05-20 NOTE — Plan of Care (Signed)
  Problem: Education: Goal: Knowledge of General Education information will improve Description: Including pain rating scale, medication(s)/side effects and non-pharmacologic comfort measures Outcome: Progressing   Problem: Health Behavior/Discharge Planning: Goal: Ability to manage health-related needs will improve Outcome: Progressing   Problem: Clinical Measurements: Goal: Ability to maintain clinical measurements within normal limits will improve Outcome: Progressing Goal: Will remain free from infection Outcome: Progressing Goal: Diagnostic test results will improve Outcome: Progressing Goal: Respiratory complications will improve Outcome: Progressing Goal: Cardiovascular complication will be avoided Outcome: Progressing   Problem: Activity: Goal: Risk for activity intolerance will decrease Outcome: Progressing   Problem: Nutrition: Goal: Adequate nutrition will be maintained Outcome: Progressing   Problem: Coping: Goal: Level of anxiety will decrease Outcome: Progressing   Problem: Elimination: Goal: Will not experience complications related to bowel motility Outcome: Progressing Goal: Will not experience complications related to urinary retention Outcome: Progressing   Problem: Pain Managment: Goal: General experience of comfort will improve Outcome: Progressing   Problem: Safety: Goal: Ability to remain free from injury will improve Outcome: Progressing   Problem: Education: Goal: Knowledge of the prescribed therapeutic regimen will improve Outcome: Progressing Goal: Individualized Educational Video(s) Outcome: Progressing   Problem: Skin Integrity: Goal: Risk for impaired skin integrity will decrease Outcome: Progressing   Problem: Activity: Goal: Ability to avoid complications of mobility impairment will improve Outcome: Progressing Goal: Range of joint motion will improve Outcome: Progressing   Problem: Clinical Measurements: Goal:  Postoperative complications will be avoided or minimized Outcome: Progressing   Problem: Pain Management: Goal: Pain level will decrease with appropriate interventions Outcome: Progressing   Problem: Skin Integrity: Goal: Will show signs of wound healing Outcome: Progressing   

## 2020-05-20 NOTE — Op Note (Signed)
OPERATIVE REPORT-TOTAL KNEE ARTHROPLASTY   Pre-operative diagnosis- Osteoarthritis  Left knee(s)  Post-operative diagnosis- Osteoarthritis Left knee(s)  Procedure-  Left  Total Knee Arthroplasty  Surgeon- Dione Plover. Kenden Brandt, MD  Assistant- Molli Barrows, PA-C   Anesthesia-  General with adductor canal block  EBL-25 mL   Drains None  Tourniquet time- 32 minutes @ 509 mm Hg  Complications- None  Condition-PACU - hemodynamically stable.   Brief Clinical Note  Elizabeth Ramirez is a 74 y.o. year old female with end stage OA of her left knee with progressively worsening pain and dysfunction. She has constant pain, with activity and at rest and significant functional deficits with difficulties even with ADLs. She has had extensive non-op management including analgesics, injections of cortisone and viscosupplements, and home exercise program, but remains in significant pain with significant dysfunction. Radiographs show bone on bone arthritis lateral and patellofemoral. She presents now for left Total Knee Arthroplasty.    Procedure in detail---   The patient is brought into the operating room and positioned supine on the operating table. After successful administration of  General with adductor canal block,   a tourniquet is placed high on the  Left thigh(s) and the lower extremity is prepped and draped in the usual sterile fashion. Time out is performed by the operating team and then the  Left lower extremity is wrapped in Esmarch, knee flexed and the tourniquet inflated to 300 mmHg.       A midline incision is made with a ten blade through the subcutaneous tissue to the level of the extensor mechanism. A fresh blade is used to make a medial parapatellar arthrotomy. Soft tissue over the proximal medial tibia is subperiosteally elevated to the joint line with a knife and into the semimembranosus bursa with a Cobb elevator. Soft tissue over the proximal lateral tibia is elevated with attention  being paid to avoiding the patellar tendon on the tibial tubercle. The patella is everted, knee flexed 90 degrees and the ACL and PCL are removed. Findings are bone on bone lateral and patellofemoral with large global osteophytes.        The drill is used to create a starting hole in the distal femur and the canal is thoroughly irrigated with sterile saline to remove the fatty contents. The 5 degree Left  valgus alignment guide is placed into the femoral canal and the distal femoral cutting block is pinned to remove 9 mm off the distal femur. Resection is made with an oscillating saw.      The tibia is subluxed forward and the menisci are removed. The extramedullary alignment guide is placed referencing proximally at the medial aspect of the tibial tubercle and distally along the second metatarsal axis and tibial crest. The block is pinned to remove 53mm off the more deficient lateral  side. Resection is made with an oscillating saw. Size 7is the most appropriate size for the tibia and the proximal tibia is prepared with the modular drill and keel punch for that size.      The femoral sizing guide is placed and size 7 is most appropriate. Rotation is marked off the epicondylar axis and confirmed by creating a rectangular flexion gap at 90 degrees. The size 7 cutting block is pinned in this rotation and the anterior, posterior and chamfer cuts are made with the oscillating saw. The intercondylar block is then placed and that cut is made.      Trial size 7 tibial component, trial size 7 posterior  stabilized femur and a 10  mm posterior stabilized rotating platform insert trial is placed. Full extension is achieved with excellent varus/valgus and anterior/posterior balance throughout full range of motion. The patella is everted and thickness measured to be 22  mm. Free hand resection is taken to 12 mm, a 38 template is placed, lug holes are drilled, trial patella is placed, and it tracks normally. Osteophytes are  removed off the posterior femur with the trial in place. All trials are removed and the cut bone surfaces prepared with pulsatile lavage. Cement is mixed and once ready for implantation, the size 7 tibial implant, size  7 posterior stabilized femoral component, and the size 38 patella are cemented in place and the patella is held with the clamp. The trial insert is placed and the knee held in full extension. The Exparel (20 ml mixed with 60 ml saline) is injected into the extensor mechanism, posterior capsule, medial and lateral gutters and subcutaneous tissues.  All extruded cement is removed and once the cement is hard the permanent 10 mm posterior stabilized rotating platform insert is placed into the tibial tray.      The wound is copiously irrigated with saline solution and the extensor mechanism closed with # 0 Stratofix suture. The tourniquet is released for a total tourniquet time of 32  minutes. Flexion against gravity is 140 degrees and the patella tracks normally. Subcutaneous tissue is closed with 2.0 vicryl and subcuticular with running 4.0 Monocryl. The incision is cleaned and dried and steri-strips and a bulky sterile dressing are applied. The limb is placed into a knee immobilizer and the patient is awakened and transported to recovery in stable condition.      Please note that a surgical assistant was a medical necessity for this procedure in order to perform it in a safe and expeditious manner. Surgical assistant was necessary to retract the ligaments and vital neurovascular structures to prevent injury to them and also necessary for proper positioning of the limb to allow for anatomic placement of the prosthesis.   Dione Plover Saheed Carrington, MD    05/20/2020, 12:17 PM

## 2020-05-20 NOTE — Evaluation (Addendum)
Physical Therapy Evaluation Patient Details Name: Elizabeth Ramirez MRN: [id number] DOB: Aug 30, 1945 Today's Date: 05/20/2020   History of Present Illness  Pt s/p L TKR and with hx of R TKR (16),  breast CA and chronic pain  Clinical Impression  Pt s/p L TKR and presents with decreased L LE strength/ROM, post op pain and nausea and dizziness with OOB activity (BP 126/73) limiting functional mobility.  Pt should progress to dc home with family assist.  Pt reports first OP PT scheduled for 05/24/20.    Follow Up Recommendations Follow surgeon's recommendation for DC plan and follow-up therapies    Equipment Recommendations  Rolling walker with 5" wheels    Recommendations for Other Services       Precautions / Restrictions Precautions Precautions: Knee;Fall Restrictions Weight Bearing Restrictions: No Other Position/Activity Restrictions: WBAT      Mobility  Bed Mobility Overal bed mobility: Needs Assistance Bed Mobility: Supine to Sit     Supine to sit: Min assist;+2 for physical assistance;+2 for safety/equipment     General bed mobility comments: cues for sequence and use of R LE to self assist.  Physical assist to manage L LE and to bring trunk to upright    Transfers Overall transfer level: Needs assistance Equipment used: Rolling walker (2 wheeled) Transfers: Sit to/from Stand Sit to Stand: Min assist;+2 physical assistance;+2 safety/equipment;From elevated surface         General transfer comment: cues for LE management and use of UEs to self assist;  Physical assist to bring wt up and fwd and to balance in initial standing  Ambulation/Gait Ambulation/Gait assistance: Min assist;+2 physical assistance;+2 safety/equipment Gait Distance (Feet): 3 Feet Assistive device: Rolling walker (2 wheeled) Gait Pattern/deviations: Step-to pattern;Decreased step length - right;Decreased step length - left;Shuffle;Trunk flexed Gait velocity: decr   General Gait Details:  cues for sequence, posture and position from RW.  Distance ltd by [employer] Rankin (Stroke Patients Only)       Balance Overall balance assessment: Needs assistance Sitting-balance support: No upper extremity supported;Feet supported Sitting balance-Leahy Scale: Fair     Standing balance support: Bilateral upper extremity supported Standing balance-Leahy Scale: Poor Standing balance comment: reliant on RW for support                             Pertinent Vitals/Pain Pain Assessment: 0-10 Pain Score: 4  Pain Descriptors / Indicators: Aching;Sore Pain Intervention(s): Limited activity within patient's tolerance;Monitored during session;Premedicated before session;Ice applied    Home Living Family/patient expects to be discharged to:: Private residence Living Arrangements: Spouse/significant other Available Help at Discharge: Family;Available 24 hours/day Type of Home: House Home Access: Stairs to enter Entrance Stairs-Rails: None Entrance Stairs-Number of Steps: 3 Home Layout: Two level;Able to live on main level with bedroom/bathroom Home Equipment: Kasandra Knudsen - single point;Bedside commode;Shower seat Additional Comments: Pt plans to sleep in recliner on main level initially    Prior Function Level of Independence: Independent;Independent with assistive device(s)         Comments: using cane prior to surgery     Hand Dominance        Extremity/Trunk Assessment   Upper Extremity Assessment Upper Extremity Assessment: Overall WFL for tasks assessed    Lower Extremity Assessment Lower Extremity Assessment: LLE deficits/detail    Cervical / Trunk Assessment Cervical / Trunk Assessment:  Normal  Communication   Communication: No difficulties  Cognition Arousal/Alertness: Awake/alert Behavior During Therapy: WFL for tasks assessed/performed Overall Cognitive Status: Within Functional  Limits for tasks assessed                                        General Comments      Exercises Total Joint Exercises Ankle Circles/Pumps: AROM;Both;15 reps;Supine   Assessment/Plan    PT Assessment Patient needs continued PT services  PT Problem List Decreased strength;Decreased range of motion;Decreased activity tolerance;Decreased balance;Decreased mobility;Pain;Obesity;Decreased knowledge of use of DME       PT Treatment Interventions DME instruction;Gait training;Stair training;Functional mobility training;Therapeutic activities;Therapeutic exercise;Patient/family education    PT Goals (Current goals can be found in the Care Plan section)  Acute Rehab PT Goals Patient Stated Goal: Regain IND PT Goal Formulation: With patient Time For Goal Achievement: 05/27/20 Potential to Achieve Goals: Good    Frequency 7X/week   Barriers to discharge        Co-evaluation               AM-PAC PT "6 Clicks" Mobility  Outcome Measure Help needed turning from your back to your side while in a flat bed without using bedrails?: A Lot Help needed moving from lying on your back to sitting on the side of a flat bed without using bedrails?: A Lot Help needed moving to and from a bed to a chair (including a wheelchair)?: A Lot Help needed standing up from a chair using your arms (e.g., wheelchair or bedside chair)?: A Lot Help needed to walk in hospital room?: A Lot Help needed climbing 3-5 steps with a railing? : A Lot 6 Click Score: 12    End of Session Equipment Utilized During Treatment: Gait belt;Left knee immobilizer Activity Tolerance: Patient limited by fatigue;Other (comment) (nausea/dizziness) Patient left: with call bell/phone within reach;in chair;with chair alarm set;with family/visitor present Nurse Communication: Mobility status PT Visit Diagnosis: History of falling (Z91.81);Difficulty in walking, not elsewhere classified (R26.2);Pain Pain -  Right/Left: Right Pain - part of body: Knee    Time: 3953-2023 PT Time Calculation (min) (ACUTE ONLY): 25 min   Charges:   PT Evaluation $PT Eval Low Complexity: 1 Low PT Treatments $Therapeutic Activity: 8-22 mins        Debe Coder PT Acute Rehabilitation Services Pager [phone] Office [phone]   Tamyah Cutbirth 05/20/2020, 4:53 PM

## 2020-05-20 NOTE — Plan of Care (Signed)
  Problem: Activity: Goal: Ability to avoid complications of mobility impairment will improve 05/20/2020 1724 by Deetta Perla, RN Outcome: Progressing 05/20/2020 1724 by Deetta Perla, RN Outcome: Progressing Goal: Range of joint motion will improve 05/20/2020 1724 by Deetta Perla, RN Outcome: Progressing 05/20/2020 1724 by Deetta Perla, RN Outcome: Progressing   Problem: Clinical Measurements: Goal: Postoperative complications will be avoided or minimized 05/20/2020 1724 by Deetta Perla, RN Outcome: Progressing 05/20/2020 1724 by Deetta Perla, RN Outcome: Progressing   Problem: Pain Management: Goal: Pain level will decrease with appropriate interventions 05/20/2020 1724 by Deetta Perla, RN Outcome: Progressing 05/20/2020 1724 by Deetta Perla, RN Outcome: Progressing   Problem: Skin Integrity: Goal: Will show signs of wound healing 05/20/2020 1724 by Deetta Perla, RN Outcome: Progressing 05/20/2020 1724 by Deetta Perla, RN Outcome: Progressing   Problem: Education: Goal: Knowledge of the prescribed therapeutic regimen will improve Outcome: Progressing Goal: Individualized Educational Video(s) Outcome: Progressing   Problem: Activity: Goal: Ability to avoid complications of mobility impairment will improve Outcome: Progressing Goal: Range of joint motion will improve Outcome: Progressing   Problem: Pain Management: Goal: Pain level will decrease with appropriate interventions Outcome: Progressing   Problem: Clinical Measurements: Goal: Postoperative complications will be avoided or minimized Outcome: Progressing   Problem: Skin Integrity: Goal: Will show signs of wound healing Outcome: Progressing

## 2020-05-20 NOTE — Anesthesia Procedure Notes (Signed)
Anesthesia Regional Block: Adductor canal block   Pre-Anesthetic Checklist: ,, timeout performed, Correct Patient, Correct Site, Correct Laterality, Correct Procedure, Correct Position, site marked, Risks and benefits discussed, pre-op evaluation,  At surgeon's request and post-op pain management  Laterality: Left  Prep: Maximum Sterile Barrier Precautions used, chloraprep       Needles:  Injection technique: Single-shot  Needle Type: Echogenic Stimulator Needle     Needle Length: 9cm  Needle Gauge: 21     Additional Needles:   Procedures:,,,, ultrasound used (permanent image in chart),,,,  Narrative:  Start time: 05/20/2020 9:43 AM End time: 05/20/2020 9:53 AM Injection made incrementally with aspirations every 5 mL.  Performed by: Personally  Anesthesiologist: Roderic Palau, MD  Additional Notes: 2% Lidocaine skin wheel.

## 2020-05-20 NOTE — Interval H&P Note (Signed)
History and Physical Interval Note:  05/20/2020 8:41 AM  Elizabeth Ramirez  has presented today for surgery, with the diagnosis of left knee osteoarthritis.  The various methods of treatment have been discussed with the patient and family. After consideration of risks, benefits and other options for treatment, the patient has consented to  Procedure(s) with comments: TOTAL KNEE ARTHROPLASTY (Left) - 3min as a surgical intervention.  The patient's history has been reviewed, patient examined, no change in status, stable for surgery.  I have reviewed the patient's chart and labs.  Questions were answered to the patient's satisfaction.     Pilar Plate Kahron Kauth

## 2020-05-20 NOTE — Anesthesia Procedure Notes (Signed)
Procedure Name: LMA Insertion Date/Time: 05/20/2020 11:25 AM Performed by: Mitzie Na, CRNA Pre-anesthesia Checklist: Patient identified, Emergency Drugs available, Suction available and Patient being monitored Patient Re-evaluated:Patient Re-evaluated prior to induction Oxygen Delivery Method: Circle system utilized Preoxygenation: Pre-oxygenation with 100% oxygen Induction Type: IV induction LMA: LMA with gastric port inserted LMA Size: 3.0 Tube type: Oral Number of attempts: 1 Placement Confirmation: positive ETCO2 and breath sounds checked- equal and bilateral Tube secured with: Tape Dental Injury: Teeth and Oropharynx as per pre-operative assessment

## 2020-05-21 DIAGNOSIS — M25562 Pain in left knee: Secondary | ICD-10-CM | POA: Diagnosis not present

## 2020-05-21 DIAGNOSIS — Z87891 Personal history of nicotine dependence: Secondary | ICD-10-CM | POA: Diagnosis not present

## 2020-05-21 DIAGNOSIS — Z96651 Presence of right artificial knee joint: Secondary | ICD-10-CM | POA: Diagnosis not present

## 2020-05-21 DIAGNOSIS — E039 Hypothyroidism, unspecified: Secondary | ICD-10-CM | POA: Diagnosis not present

## 2020-05-21 DIAGNOSIS — M1990 Unspecified osteoarthritis, unspecified site: Secondary | ICD-10-CM | POA: Diagnosis not present

## 2020-05-21 DIAGNOSIS — Z79899 Other long term (current) drug therapy: Secondary | ICD-10-CM | POA: Diagnosis not present

## 2020-05-21 LAB — BASIC METABOLIC PANEL
Anion gap: 9 (ref 5–15)
BUN: 12 mg/dL (ref 8–23)
CO2: 21 mmol/L — ABNORMAL LOW (ref 22–32)
Calcium: 7.3 mg/dL — ABNORMAL LOW (ref 8.9–10.3)
Chloride: 112 mmol/L — ABNORMAL HIGH (ref 98–111)
Creatinine, Ser: 0.82 mg/dL (ref 0.44–1.00)
GFR, Estimated: >60 mL/min (ref >60)
Glucose, Bld: 121 mg/dL — ABNORMAL HIGH (ref 70–99)
Potassium: 3.8 mmol/L (ref 3.5–5.1)
Sodium: 142 mmol/L (ref 135–145)

## 2020-05-21 LAB — CBC
HCT: 31.8 % — ABNORMAL LOW (ref 36.0–46.0)
Hemoglobin: 10.3 g/dL — ABNORMAL LOW (ref 12.0–15.0)
MCH: 31.4 pg (ref 26.0–34.0)
MCHC: 32.4 g/dL (ref 30.0–36.0)
MCV: 97 fL (ref 80.0–100.0)
Platelets: 128 10*3/uL — ABNORMAL LOW (ref 150–400)
RBC: 3.28 MIL/uL — ABNORMAL LOW (ref 3.87–5.11)
RDW: 14.5 % (ref 11.5–15.5)
WBC: 5 10*3/uL (ref 4.0–10.5)
nRBC: 0 % (ref 0.0–0.2)

## 2020-05-21 MED ORDER — OXYCODONE HCL 5 MG PO TABS
5.0000 mg | ORAL_TABLET | ORAL | 0 refills | Status: DC | PRN
Start: 2020-05-21 — End: 2020-08-06

## 2020-05-21 MED ORDER — GABAPENTIN 300 MG PO CAPS
ORAL_CAPSULE | ORAL | 0 refills | Status: DC
Start: 2020-05-21 — End: 2020-08-06

## 2020-05-21 MED ORDER — RIVAROXABAN 10 MG PO TABS: 10.0000 mg | ORAL_TABLET | Freq: Every day | ORAL | 0 refills | Status: DC

## 2020-05-21 MED ORDER — METHOCARBAMOL 500 MG PO TABS: 500.0000 mg | ORAL_TABLET | Freq: Four times a day (QID) | ORAL | 0 refills | Status: DC | PRN

## 2020-05-21 MED ORDER — TRAMADOL HCL 50 MG PO TABS
50.0000 mg | ORAL_TABLET | Freq: Four times a day (QID) | ORAL | 0 refills | Status: DC | PRN
Start: 2020-05-21 — End: 2020-08-06

## 2020-05-21 NOTE — Progress Notes (Signed)
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 No dizziness or lightheadedness with mobility. See below for areas reviewed. Ready for d/c with family assist  prn  Follow Up Recommendations  Follow surgeon's recommendation for DC plan and follow-up therapies     Equipment Recommendations  Rolling walker with 5" wheels    Recommendations for Other Services       Precautions / Restrictions Precautions Precautions: Knee;Fall Restrictions Weight Bearing Restrictions: No Other Position/Activity Restrictions: WBAT    Mobility  Bed Mobility               General bed mobility comments: in recliner   Transfers Overall transfer level: Needs assistance Equipment used: Rolling walker (2 wheeled) Transfers: Sit to/from Stand Sit to Stand: Supervision         General transfer comment: cues for LLE position and hand placement. good carryover end of session  Ambulation/Gait Ambulation/Gait assistance: Min guard;Supervision Gait Distance (Feet): 40 Feet Assistive device: Rolling walker (2 wheeled) Gait Pattern/deviations: Step-to pattern;Decreased step length - right;Trunk flexed;Decreased step length - left;Decreased stance time - left Gait velocity: decr   General Gait Details: cues for sequence, posture and position from RW.     Stairs Stairs: Yes Stairs assistance: Min assist Stair Management: No rails;Step to pattern;Backwards;With walker Number of Stairs: 3 General stair comments: cues for sequence and RW position, overall safety    Wheelchair Mobility    Modified Rankin (Stroke Patients Only)       Balance Overall balance assessment: Needs assistance Sitting-balance support: No upper extremity supported;Feet supported Sitting  balance-Leahy Scale: Fair     Standing balance support: Bilateral upper extremity supported Standing balance-Leahy Scale: Fair Standing balance comment: static stand and don mask without LOB                            Cognition Arousal/Alertness: Awake/alert Behavior During Therapy: WFL for tasks assessed/performed Overall Cognitive Status: Within Functional Limits for tasks assessed                                        Exercises Total Joint Exercises Ankle Circles/Pumps: AROM;Both;15 reps;Supine Quad Sets: AROM;Both;10 reps Heel Slides: AAROM;Left;10 reps    General Comments        Pertinent Vitals/Pain Pain Assessment: 0-10 Pain Score: 4  Pain Location: L knee Pain Descriptors / Indicators: Aching;Sore Pain Intervention(s): Limited activity within patient's tolerance;Monitored during session;Premedicated before session;Ice applied    Home Living                      Prior Function            PT Goals (current goals can now be found in the care plan section) Acute Rehab PT Goals Patient Stated Goal: Regain IND PT Goal Formulation: With patient Time For Goal Achievement: 05/27/20 Potential to Achieve Goals: Good Progress towards PT goals: Progressing toward goals    Frequency    7X/week      PT Plan Current plan remains appropriate    Co-evaluation  AM-PAC PT "6 Clicks" Mobility   Outcome Measure  Help needed turning from your back to your side while in a flat bed without using bedrails?: A Lot Help needed moving from lying on your back to sitting on the side of a flat bed without using bedrails?: A Little Help needed moving to and from a bed to a chair (including a wheelchair)?: A Little Help needed standing up from a chair using your arms (e.g., wheelchair or bedside chair)?: A Little Help needed to walk in hospital room?: A Little Help needed climbing 3-5 steps with a railing? : A Little 6  Click Score: 17    End of Session Equipment Utilized During Treatment: Gait belt;Left knee immobilizer Activity Tolerance: Patient tolerated treatment well (nausea/dizziness) Patient left: with call bell/phone within reach;in chair;with chair alarm set Nurse Communication: Mobility status PT Visit Diagnosis: History of falling (Z91.81);Difficulty in walking, not elsewhere classified (R26.2);Pain Pain - Right/Left: Right Pain - part of body: Knee     Time: 0951-1020 PT Time Calculation (min) (ACUTE ONLY): 29 min  Charges:  $Gait Training: 8-22 mins $Therapeutic Exercise: 8-22 mins                     Baxter Flattery, PT  Acute Rehab Dept ([place]) [phone] Pager [phone]  05/21/2020    [facility] 05/21/2020, 10:31 AM

## 2020-05-21 NOTE — Plan of Care (Signed)
  Problem: Education: Goal: Knowledge of General Education information will improve Description: Including pain rating scale, medication(s)/side effects and non-pharmacologic comfort measures Outcome: Adequate for Discharge   Problem: Health Behavior/Discharge Planning: Goal: Ability to manage health-related needs will improve Outcome: Adequate for Discharge   Problem: Clinical Measurements: Goal: Ability to maintain clinical measurements within normal limits will improve Outcome: Adequate for Discharge Goal: Will remain free from infection Outcome: Adequate for Discharge Goal: Diagnostic test results will improve Outcome: Adequate for Discharge Goal: Respiratory complications will improve Outcome: Adequate for Discharge Goal: Cardiovascular complication will be avoided Outcome: Adequate for Discharge   Problem: Activity: Goal: Risk for activity intolerance will decrease Outcome: Adequate for Discharge   Problem: Nutrition: Goal: Adequate nutrition will be maintained Outcome: Adequate for Discharge   Problem: Coping: Goal: Level of anxiety will decrease Outcome: Adequate for Discharge   Problem: Elimination: Goal: Will not experience complications related to bowel motility Outcome: Adequate for Discharge Goal: Will not experience complications related to urinary retention Outcome: Adequate for Discharge   Problem: Pain Managment: Goal: General experience of comfort will improve Outcome: Adequate for Discharge   Problem: Safety: Goal: Ability to remain free from injury will improve Outcome: Adequate for Discharge   Problem: Skin Integrity: Goal: Risk for impaired skin integrity will decrease Outcome: Adequate for Discharge   Problem: Education: Goal: Knowledge of the prescribed therapeutic regimen will improve Outcome: Adequate for Discharge Goal: Individualized Educational Video(s) Outcome: Adequate for Discharge   Problem: Activity: Goal: Ability to avoid  complications of mobility impairment will improve Outcome: Adequate for Discharge Goal: Range of joint motion will improve Outcome: Adequate for Discharge   Problem: Clinical Measurements: Goal: Postoperative complications will be avoided or minimized Outcome: Adequate for Discharge   Problem: Pain Management: Goal: Pain level will decrease with appropriate interventions Outcome: Adequate for Discharge   Problem: Skin Integrity: Goal: Will show signs of wound healing Outcome: Adequate for Discharge   Problem: Education: Goal: Knowledge of the prescribed therapeutic regimen will improve Outcome: Adequate for Discharge Goal: Individualized Educational Video(s) Outcome: Adequate for Discharge   Problem: Activity: Goal: Ability to avoid complications of mobility impairment will improve Outcome: Adequate for Discharge Goal: Range of joint motion will improve Outcome: Adequate for Discharge   Problem: Clinical Measurements: Goal: Postoperative complications will be avoided or minimized Outcome: Adequate for Discharge   Problem: Pain Management: Goal: Pain level will decrease with appropriate interventions Outcome: Adequate for Discharge   Problem: Skin Integrity: Goal: Will show signs of wound healing Outcome: Adequate for Discharge

## 2020-05-21 NOTE — Progress Notes (Signed)
Subjective: 1 Day Post-Op Procedure(s) (LRB): TOTAL KNEE ARTHROPLASTY (Left) Patient reports pain as mild.   Patient seen in rounds by Dr. Wynelle Link. Patient is well, and has had no acute complaints or problems other than pain in the left knee. Denies chest pain, SOB, or calf pain. Foley catheter removed this AM. No issues overnight. We will continue therapy today.   Objective: Vital signs in last 24 hours: Temp:  [97.6 F (36.4 C)-98.3 F (36.8 C)] 97.8 F (36.6 C) (12/07 0505) Pulse Rate:  [60-92] 73 (12/07 0505) Resp:  [10-19] 18 (12/07 0505) BP: (91-158)/(53-104) 108/61 (12/07 0505) SpO2:  [91 %-100 %] 96 % (12/07 0505) Weight:  [103.4 kg] 103.4 kg (12/06 0839)  Intake/Output from previous day:  Intake/Output Summary (Last 24 hours) at 05/21/2020 0708 Last data filed at 05/21/2020 0300 Gross per 24 hour  Intake 2156.25 ml  Output 375 ml  Net 1781.25 ml     Intake/Output this shift: No intake/output data recorded.  Labs: Recent Labs    05/21/20 0328  HGB 10.3*   Recent Labs    05/21/20 0328  WBC 5.0  RBC 3.28*  HCT 31.8*  PLT 128*   Recent Labs    05/21/20 0328  NA 142  K 3.8  CL 112*  CO2 21*  BUN 12  CREATININE 0.82  GLUCOSE 121*  CALCIUM 7.3*   No results for input(s): LABPT, INR in the last 72 hours.  Exam: General - Patient is Alert and Oriented Extremity - Neurologically intact Neurovascular intact Sensation intact distally Dorsiflexion/Plantar flexion intact Dressing - dressing C/D/I Motor Function - intact, moving foot and toes well on exam.   Past Medical History:  Diagnosis Date  . Allergy    tetracycline, levofloxacin, crabs & bay leaves  . Arthritis    Rt knee, Rt shoulder  . 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 Cancer ([place])   . Ear infection 10/19/2013  . Genetic testing 01/18/2015   Negative - no mutations found in any of 20  genes on the Breast/Ovarian Cancer Panel.  Additionally, no variants of uncertain significance (VUSs) were found.  The Breast/Ovarian gene panel offered by [facility] [name], MD) includes sequencing and deletion/duplication analysis for the following 19 genes:  ATM, BARD1, BRCA1, BRCA2, BRIP1, CDH1, CHEK2, FANCC, MLH1, MSH2, MSH6, NBN, PALB2, PMS2, PTEN, RAD51C, RAD51D, TP53, and XRCC2.  This panel also includes deletion/duplication analysis (without sequencing) for one gene, EPCAM.  Date of report is January 14, 2015.    Marland Kitchen Heart murmur   . History of kidney stones    x1 '71  . Hypothyroid 07/29/2011  . Hypothyroidism   . Malignant neoplasm of upper-outer quadrant of right breast in female, estrogen receptor positive ([place]) 01/14/2015  . OA (osteoarthritis) of knee 05/27/2015  . Obesity, morbid, BMI 40.0-49.9 ([place]) 12/31/2015  . Other chronic pain   . Personal history of radiation therapy   . PONV (postoperative nausea and vomiting)    extreme nausea and vomiting - cannot tolerate scopalamine   . 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 Seasonal allergies   . Thyroid disease   . Vitamin D deficiency     Assessment/Plan: 1 Day Post-Op Procedure(s) (LRB): TOTAL KNEE ARTHROPLASTY (Left) Active Problems:   Primary osteoarthritis of left knee  Estimated body mass index is  39.14 kg/m as calculated from the following:   Height as of this encounter: _0  (1.626 m).   Weight as of this encounter: 103.4 kg. Advance diet Up with therapy D/C IV fluids   Patient's anticipated LOS is less than 2 midnights, meeting these requirements: - Younger than 67 - Lives within 1 hour of care - Has a competent adult at home to recover with post-op recover - NO history of  - Chronic pain requiring opiods  - Diabetes  - Coronary Artery Disease  - Heart failure  - Heart attack  - Stroke  - DVT/VTE  -  Cardiac arrhythmia  - Respiratory Failure/COPD  - Renal failure  - Anemia  - Advanced Liver disease  DVT Prophylaxis - Xarelto Weight bearing as tolerated. Continue therapy.  Plan is to go Home after hospital stay. Plan for discharge today once cleared by physical therapy. Scheduled [facility] OPPT at [facility]. Follow-up in the office in 2 weeks.  The PDMP database was reviewed today prior to any opioid medications being prescribed to this patient.   Theresa Duty, PA-C Orthopedic Surgery [phone] 05/21/2020, 7:08 AM

## 2020-05-21 NOTE — TOC Progression Note (Signed)
Transition of Care [facility]) - Progression Note    Patient Details  Name: CLYDETTE PRIVITERA MRN: [id number] Date of Birth: 03-Mar-1946  Transition of [facility]) CM/SW Contact  Joaquin Courts, RN Phone Number: 05/21/2020, 9:26 AM  Clinical Narrative:  CM spoke with patient confirmed plan for OPPT.  Patient reports will need rolling walker but declined 3in1, states has raised toilet seats at home, [employer] to provide dme.      Expected Discharge Plan: Home/Self Care Barriers to Discharge: No Barriers Identified  Expected Discharge Plan and Services Expected Discharge Plan: Home/Self Care   Discharge Planning Services: CM Consult     Expected Discharge Date: 05/21/20               DME Arranged: Gilford Rile rolling DME Agency: [employer] Date DME Agency Contacted: 05/21/20 Time DME Agency Contacted: [phone] Representative spoke with at DME Agency: nathan HH Arranged: NA [place] Agency: NA         Social Determinants of Health ([place]) Interventions    Readmission Risk Interventions No flowsheet data found.

## 2020-05-22 ENCOUNTER — Encounter (HOSPITAL_COMMUNITY): Payer: Self-pay | Admitting: Orthopedic Surgery

## 2020-05-24 DIAGNOSIS — M25562 Pain in left knee: Secondary | ICD-10-CM | POA: Diagnosis not present

## 2020-05-27 ENCOUNTER — Telehealth: Payer: Self-pay | Admitting: Genetic Counselor

## 2020-05-27 NOTE — Telephone Encounter (Signed)
R/s per provider. Called and spoke with pt, confirmed 1/17 appt

## 2020-05-28 DIAGNOSIS — M25562 Pain in left knee: Secondary | ICD-10-CM | POA: Diagnosis not present

## 2020-05-28 NOTE — Discharge Summary (Signed)
Physician Discharge Summary   Patient ID: Elizabeth Ramirez MRN: [id number] DOB/AGE: 1945-10-05 74 y.o.  Admit date: 05/20/2020 Discharge date: 05/21/2020  Primary Diagnosis: Osteoarthritis, left knee   Admission Diagnoses:  Past Medical History:  Diagnosis Date  . Allergy    tetracycline, levofloxacin, crabs & bay leaves  . Arthritis    Rt knee, Rt shoulder  . 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 Cancer ([place])   . Ear infection 10/19/2013  . Genetic testing 01/18/2015   Negative - no mutations found in any of 20 genes on the Breast/Ovarian Cancer Panel.  Additionally, no variants of uncertain significance (VUSs) were found.  The Breast/Ovarian gene panel offered by [facility] [name], MD) includes sequencing and deletion/duplication analysis for the following 19 genes:  ATM, BARD1, BRCA1, BRCA2, BRIP1, CDH1, CHEK2, FANCC, MLH1, MSH2, MSH6, NBN, PALB2, PMS2, PTEN, RAD51C, RAD51D, TP53, and XRCC2.  This panel also includes deletion/duplication analysis (without sequencing) for one gene, EPCAM.  Date of report is January 14, 2015.    Marland Kitchen Heart murmur   . History of kidney stones    x1 '71  . Hypothyroid 07/29/2011  . Hypothyroidism   . Malignant neoplasm of upper-outer quadrant of right breast in female, estrogen receptor positive ([place]) 01/14/2015  . OA (osteoarthritis) of knee 05/27/2015  . Obesity, morbid, BMI 40.0-49.9 ([place]) 12/31/2015  . Other chronic pain   . Personal history of radiation therapy   . PONV (postoperative nausea and vomiting)    extreme nausea and vomiting - cannot tolerate scopalamine   . 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 Seasonal allergies   . Thyroid disease   . Vitamin D deficiency    Discharge Diagnoses:   Active Problems:   Primary osteoarthritis of left  knee  Estimated body mass index is 39.14 kg/m as calculated from the following:   Height as of this encounter: _0  (1.626 m).   Weight as of this encounter: 103.4 kg.  Procedure:  Procedure(s) (LRB): TOTAL KNEE ARTHROPLASTY (Left)   Consults: None  HPI: Elizabeth Ramirez is a 74 y.o. year old female with end stage OA of her left knee with progressively worsening pain and dysfunction. She has constant pain, with activity and at rest and significant functional deficits with difficulties even with ADLs. She has had extensive non-op management including analgesics, injections of cortisone and viscosupplements, and home exercise program, but remains in significant pain with significant dysfunction. Radiographs show bone on bone arthritis lateral and patellofemoral. She presents now for left Total Knee Arthroplasty.   Laboratory Data: Admission on 05/20/2020, Discharged on 05/21/2020  Component Date Value Ref Range Status  . WBC 05/21/2020 5.0  4.0 - 10.5 K/uL Final  . RBC 05/21/2020 3.28* 3.87 - 5.11 MIL/uL Final  . Hemoglobin 05/21/2020 10.3* 12.0 - 15.0 g/dL Final  . HCT 05/21/2020 31.8* 36.0 - 46.0 % Final  . MCV 05/21/2020 97.0  80.0 - 100.0 fL Final  . MCH 05/21/2020 31.4  26.0 - 34.0 pg Final  . MCHC 05/21/2020 32.4  30.0 - 36.0 g/dL Final  . RDW 05/21/2020 14.5  11.5 - 15.5 % Final  . Platelets 05/21/2020 128* 150 - 400 K/uL Final  . nRBC 05/21/2020 0.0  0.0 - 0.2 % Final   Performed at [facility], [place]  [address], [place], [place] [address]  . Sodium 05/21/2020 142  135 - 145 mmol/L Final  . Potassium 05/21/2020 3.8  3.5 - 5.1 mmol/L Final  . Chloride 05/21/2020 112* 98 - 111 mmol/L Final  . CO2 05/21/2020 21* 22 - 32 mmol/L Final  . Glucose, Bld 05/21/2020 121* 70 - 99 mg/dL Final   Glucose reference range applies only to samples taken after fasting for at least 8 hours.  . BUN 05/21/2020 12  8 - 23 mg/dL Final  . Creatinine, Ser 05/21/2020 0.82  0.44 -  1.00 mg/dL Final  . Calcium 05/21/2020 7.3* 8.9 - 10.3 mg/dL Final  . GFR, Estimated 05/21/2020 >60  >60 mL/min Final   Comment: (NOTE) Calculated using the CKD-EPI Creatinine Equation (2021)   . Anion gap 05/21/2020 9  5 - 15 Final   Performed at [facility], [place] [address], [place], [place] [address]  Hospital Outpatient Visit on 05/17/2020  Component Date Value Ref Range Status  . SARS Coronavirus 2 05/17/2020 NEGATIVE  NEGATIVE Final   Comment: (NOTE) SARS-CoV-2 target nucleic acids are NOT DETECTED.  The SARS-CoV-2 RNA is generally detectable in upper and lower respiratory specimens during the acute phase of infection. Negative results do not preclude SARS-CoV-2 infection, do not rule out co-infections with other pathogens, and should not be used as the sole basis for treatment or other patient management decisions. Negative results must be combined with clinical observations, patient history, and epidemiological information. The expected result is Negative.  Fact Sheet for Patients: SugarRoll.be  Fact Sheet for Healthcare Providers: https://www.woods-mathews.com/  This test is not yet approved or cleared by the [place] FDA and  has been authorized for detection and/or diagnosis of SARS-CoV-2 by FDA under an Emergency Use Authorization (EUA). This EUA will remain  in effect (meaning this test can be used) for the duration of the COVID-19 declaration under Se                          ction 564(b)(1) of the Act, 21 U.S.C. section 360bbb-3(b)(1), unless the authorization is terminated or revoked sooner.  Performed at [facility], [place] [address] [place], [place] [address]   Appointment on 05/15/2020  Component Date Value Ref Range Status  . QuantiFERON-TB Gold Plus 05/15/2020 NEGATIVE  NEGATIVE Final   Comment: Negative test result. M. tuberculosis complex  infection unlikely.   Marland Kitchen NIL 05/15/2020  0.04  IU/mL Final  . Mitogen-NIL 05/15/2020 7.01  IU/mL Final  . TB1-NIL 05/15/2020 0.01  IU/mL Final  . TB2-NIL 05/15/2020 0.02  IU/mL Final   Comment: . The Nil tube value reflects the background interferon gamma immune response of the patient's blood sample. This value has been subtracted from the patient's displayed TB and Mitogen results. . Lower than expected results with the Mitogen tube prevent false-negative Quantiferon readings by detecting a patient with a potential immune suppressive condition and/or suboptimal pre-analytical specimen handling. . The TB1 Antigen tube is coated with the M. tuberculosis-specific antigens designed to elicit responses from TB antigen primed CD4+ helper T-lymphocytes. . The TB2 Antigen tube is coated with the M. tuberculosis-specific antigens designed to elicit responses from TB antigen primed CD4+ helper and CD8+ cytotoxic T-lymphocytes. . For additional information, please refer to https://education.questdiagnostics.com/faq/FAQ204 (This link is being provided for informational/ educational purposes only.) .   Hospital Outpatient Visit on 05/14/2020  Component Date Value Ref Range Status  . MRSA, PCR 05/14/2020 NEGATIVE  NEGATIVE Final  . Staphylococcus aureus 05/14/2020 NEGATIVE  NEGATIVE Final   Comment: (NOTE) The Xpert SA Assay (FDA approved for NASAL specimens in patients 77 years of age and older), is one component of a comprehensive surveillance program. It is not intended to diagnose infection nor to guide or monitor treatment. Performed at [facility], [place] [address] [place], [place] [address]   . aPTT 05/14/2020 29  24 - 36 seconds Final   Performed at [facility], [place] [address] [place], [place] [address]  . WBC 05/14/2020 3.0* 4.0 - 10.5 K/uL Final  . RBC 05/14/2020 4.23  3.87 - 5.11 MIL/uL Final  . Hemoglobin 05/14/2020 13.0  12.0 - 15.0 g/dL Final  . HCT 05/14/2020 39.5   36.0 - 46.0 % Final  . MCV 05/14/2020 93.4  80.0 - 100.0 fL Final  . MCH 05/14/2020 30.7  26.0 - 34.0 pg Final  . MCHC 05/14/2020 32.9  30.0 - 36.0 g/dL Final  . RDW 05/14/2020 14.6  11.5 - 15.5 % Final  . Platelets 05/14/2020 152  150 - 400 K/uL Final  . nRBC 05/14/2020 0.0  0.0 - 0.2 % Final   Performed at [facility], [place] [address], [place], [place] [address]  . Sodium 05/14/2020 140  135 - 145 mmol/L Final  . Potassium 05/14/2020 4.4  3.5 - 5.1 mmol/L Final  . Chloride 05/14/2020 107  98 - 111 mmol/L Final  . CO2 05/14/2020 26  22 - 32 mmol/L Final  . Glucose, Bld 05/14/2020 97  70 - 99 mg/dL Final   Glucose reference range applies only to samples taken after fasting for at least 8 hours.  . BUN 05/14/2020 21  8 - 23 mg/dL Final  . Creatinine, Ser 05/14/2020 1.19* 0.44 - 1.00 mg/dL Final  . Calcium 05/14/2020 9.2  8.9 - 10.3 mg/dL Final  . Total Protein 05/14/2020 6.7  6.5 - 8.1 g/dL Final  . Albumin 05/14/2020 4.1  3.5 - 5.0 g/dL Final  . AST 05/14/2020 15  15 - 41 U/L Final  . ALT 05/14/2020 15  0 - 44 U/L Final  . Alkaline Phosphatase 05/14/2020 58  38 - 126 U/L Final  . Total Bilirubin 05/14/2020 1.2  0.3 - 1.2 mg/dL Final  . GFR, Estimated 05/14/2020 48* >60 mL/min Final   Comment: (NOTE) Calculated using the CKD-EPI Creatinine Equation (2021)   . Anion gap 05/14/2020 7  5 - 15 Final   Performed at [facility], [place] [address], [place], [place] [address]  . Prothrombin Time 05/14/2020 12.3  11.4 - 15.2 seconds Final  . INR 05/14/2020 1.0  0.8 - 1.2 Final   Comment: (NOTE) INR goal varies based on device and disease states. Performed at [facility], [place] [address] [place], [place] [address]   . ABO/RH(D) 05/14/2020 O NEG   Final  . Antibody Screen 05/14/2020 NEG   Final  . Sample Expiration 05/14/2020 05/23/2020,2359   Final  . Extend sample reason 05/14/2020    Final                   Value:NO TRANSFUSIONS  OR PREGNANCY IN THE PAST 3 MONTHS Performed at [facility], [place] [address], [place], [place] [address]   Office Visit on 05/06/2020  Component Date Value Ref Range Status  . ANCA Screen 05/06/2020 NEGATIVE  NEGATIVE Final   Comment: ANCA Screen includes evaluation for p-ANCA, c-ANCA and  atypical p-ANCA. A positive ANCA screen reflexes to titer and pattern(s), e.g., cytoplasmic pattern (c-ANCA), perinuclear pattern (p-ANCA), or atypical p-ANCA pattern.  c-ANCA and p-ANCA are observed in vasculitis, whereas atypical p-ANCA is observed in IBD (Inflammatory Bowel Disease). Atypical p-ANCA is  detected in about 55% to 80% of patients with ulcerative colitis but only 5% to 25% of patients with Crohn's disease. .   . Angiotensin-Converting Enzyme 05/06/2020 36  9 - 67 U/L Final  . Sed Rate 05/06/2020 7  0 - 30 mm/hr Final  Orders Only on 04/01/2020  Component Date Value Ref Range Status  . CYTOLOGY - NON GYN 04/01/2020    Final-Edited                   Value:Cytology - Non PAP CASE: [id number] PATIENT: Boris Sharper Non-Gynecological Cytology Report     Clinical History: History of right inguinal LN resection, breast ca, now with post-op fluid collection, post US guided aspiration of smaller satellite collection. #2 - Anterior aspect of right thigh Specimen Submitted:  A. CYST, ANTERIOR ASPECT, RIGHT THIGH, FINE NEEDLE ASPIRATION:   FINAL MICROSCOPIC DIAGNOSIS: - No malignant cells identified  SPECIMEN ADEQUACY: Satisfactory for evaluation  DIAGNOSTIC COMMENTS: Inflammation present.  GROSS: Received is/are 10 ccs of pink fluid. (CM:cm) Prepared: Smears:  0 Concentration Method (Thin Prep):  1 Cell Block:  1 Additional Studies:  n/a     Final Diagnosis performed by Vicente Males, MD.   Electronically signed 04/02/2020 Technical and / or Professional components performed at [facility], [place] [address] [place], [place]  [address].  Immunohistochemistry Technical component (if applic                         able) was performed at [facility]. [address] [place], [place], [place] [address].   IMMUNOHISTOCHEMISTRY DISCLAIMER (if applicable): Some of these immunohistochemical stains may have been developed and the performance characteristics determine by [facility]. Some may not have been cleared or approved by the U.S. Food and Drug Administration. The FDA has determined that such clearance or approval is not necessary. This test is used for clinical purposes. It should not be regarded as investigational or for research. This laboratory is certified under the [place] (CLIA-88) as qualified to perform high complexity clinical laboratory testing.  The controls stained appropriately.   Hospital Outpatient Visit on 04/01/2020  Component Date Value Ref Range Status  . WBC 04/01/2020 4.8  4.0 - 10.5 K/uL Final  . RBC 04/01/2020 4.31  3.87 - 5.11 MIL/uL Final  . Hemoglobin 04/01/2020 13.1  12.0 - 15.0 g/dL Final  . HCT 04/01/2020 39.2  36.0 - 46.0 % Final  . MCV 04/01/2020 91.0  80.0 - 100.0 fL Final  . MCH 04/01/2020 30.4  26.0 - 34.0 pg Final  . MCHC 04/01/2020 33.4  30.0 - 36.0 g/dL Final  . RDW 04/01/2020 14.5  11.5 - 15.5 % Final  . Platelets 04/01/2020 237  150 - 400 K/uL Final  . nRBC 04/01/2020 0.0  0.0 - 0.2 % Final  . Neutrophils Relative % 04/01/2020 68  % Final  . Neutro Abs 04/01/2020 3.3  1.7 - 7.7 K/uL Final  . Lymphocytes Relative 04/01/2020 20  % Final  . Lymphs Abs 04/01/2020 0.9  0.7 - 4.0 K/uL Final  . Monocytes Relative 04/01/2020 9  % Final  . Monocytes Absolute 04/01/2020 0.4  0.1 -  1.0 K/uL Final  . Eosinophils Relative 04/01/2020 3  % Final  . Eosinophils Absolute 04/01/2020 0.2  0.0 - 0.5 K/uL Final  . Basophils Relative 04/01/2020 0  % Final  . Basophils Absolute 04/01/2020 0.0  0.0 - 0.1 K/uL Final  .  Immature Granulocytes 04/01/2020 0  % Final  . Abs Immature Granulocytes 04/01/2020 0.02  0.00 - 0.07 K/uL Final   Performed at [facility], [place] [address] [place], [place] [address]  . Prothrombin Time 04/01/2020 12.4  11.4 - 15.2 seconds Final  . INR 04/01/2020 1.0  0.8 - 1.2 Final   Comment: (NOTE) INR goal varies based on device and disease states. Performed at [facility], [place] [address] [place], [place] [address]   . CYTOLOGY - NON GYN 04/01/2020    Final-Edited                   Value:CYTOLOGY - NON PAP CASE: [id number] PATIENT: Boris Sharper Non-Gynecological Cytology Report     Clinical History: History of right inguinal LN resection, breast ca, now with post-op fluid collection, post US guided aspiration of dominant fluid collection. #1 - Right thigh Specimen Submitted:  A. CYST, RIGHT THIGH, FINE NEEDLE ASPIRATION:   FINAL MICROSCOPIC DIAGNOSIS: - No malignant cells identified - Mixed inflammation  SPECIMEN ADEQUACY: Satisfactory for evaluation  GROSS: Received is/are 20 ccs of slightly cloudy yellow fluid. (CM:cm) Prepared: Smears:  0 Concentration Method (Thin Prep):  1 Cell Block:  1 Additional Studies:  n/a     Final Diagnosis performed by Vicente Males, MD.   Electronically signed 04/02/2020 Technical and / or Professional components performed at [facility], [place] [address] [place], [place] [address].  Immunohistochemistry Technical component (if applicable) was performed at [facility]                         sociates. [address], [place], [place], [place] [address].   IMMUNOHISTOCHEMISTRY DISCLAIMER (if applicable): Some of these immunohistochemical stains may have been developed and the performance characteristics determine by [facility]. Some may not have been cleared or approved by the U.S. Food and Drug Administration. The FDA has determined that  such clearance or approval is not necessary. This test is used for clinical purposes. It should not be regarded as investigational or for research. This laboratory is certified under the [place] (CLIA-88) as qualified to perform high complexity clinical laboratory testing.  The controls stained appropriately.      X-Rays:US Abdomen Limited RUQ (LIVER/GB)  Result Date: 05/10/2020 CLINICAL DATA:  abnormal pet scan of liver and spleen EXAM: ULTRASOUND ABDOMEN LIMITED RIGHT UPPER QUADRANT COMPARISON:  02/08/2020 PET scan. FINDINGS: Gallbladder: No gallstones or wall thickening visualized. No sonographic Murphy sign noted by sonographer. Common bile duct: Diameter: 1.9 mm Liver: No focal lesion identified. Mildly increased parenchymal echogenicity. Portal vein is patent on color Doppler imaging with normal direction of blood flow towards the liver. Other: None. IMPRESSION: Mild hepatic steatosis.  No focal hepatic lesion. Electronically Signed   By: Primitivo Gauze M.D.   On: 05/10/2020 10:28    EKG: Orders placed or performed during the hospital encounter of 07/12/18  . EKG 12 lead  . EKG 12 lead     Hospital Course: LIZANDRA ZAKRZEWSKI is a 74 y.o. who was admitted to [facility] They were brought to the operating room on  05/20/2020 and underwent Procedure(s): TOTAL KNEE ARTHROPLASTY.  Patient tolerated the procedure well and was later transferred to the recovery room and then to the orthopaedic floor for postoperative care. They were given PO and IV analgesics for pain control following their surgery. They were given 24 hours of postoperative antibiotics of  Anti-infectives (From admission, onward)   Start     Dose/Rate Route Frequency Ordered Stop   05/20/20 1700  ceFAZolin (ANCEF) IVPB 2g/100 mL premix        2 g 200 mL/hr over 30 Minutes Intravenous Every 6 hours 05/20/20 1427 05/21/20 0856   05/20/20 0815  ceFAZolin (ANCEF) IVPB  2g/100 mL premix        2 g 200 mL/hr over 30 Minutes Intravenous On call to O.R. 05/20/20 0813 05/20/20 1106     and started on DVT prophylaxis in the form of Xarelto.   PT and OT were ordered for total joint protocol. Discharge planning consulted to help with postop disposition and equipment needs.  Patient had a good night on the evening of surgery. They started to get up OOB with therapy on POD #0. Pt was seen during rounds and was ready to go home pending progress with therapy. She worked with therapy on POD #1 and was meeting her goals. Pt was discharged to home later that day in stable condition.  Diet: Regular diet Activity: WBAT Follow-up: in 2 weeks Disposition: Home with OPPT Discharged Condition: stable   Discharge Instructions    Call MD / Call 911   Complete by: As directed    If you experience chest pain or shortness of breath, CALL 911 and be transported to the hospital emergency room.  If you develope a fever above 101 F, pus (white drainage) or increased drainage or redness at the wound, or calf pain, call your surgeon's office.   Change dressing   Complete by: As directed    You may remove the bulky bandage (ACE wrap and gauze) two days after surgery. You will have an adhesive waterproof bandage underneath. Leave this in place until your first follow-up appointment.   Constipation Prevention   Complete by: As directed    Drink plenty of fluids.  Prune juice may be helpful.  You may use a stool softener, such as Colace (over the counter) 100 mg twice a day.  Use MiraLax (over the counter) for constipation as needed.   Diet - low sodium heart healthy   Complete by: As directed    Do not put a pillow under the knee. Place it under the heel.   Complete by: As directed    Driving restrictions   Complete by: As directed    No driving for two weeks   TED hose   Complete by: As directed    Use stockings (TED hose) for three weeks on both leg(s).  You may remove them at  night for sleeping.   Weight bearing as tolerated   Complete by: As directed      Allergies as of 05/21/2020      Reactions   Levofloxacin Other (See Comments)   Affected tendons, severe pain and swelling   Tetracyclines & Related Swelling   tongue swelling and peeling   Other Diarrhea, Rash   Crabs - diarrhea. Bay leaves - rash in her mouth   Ciprofloxacin    Tendon problems    Flonase [fluticasone]    Nasal spray cause nose bleeds   Ibuprofen    Gets mouth ulcers  if she takes for more than 3 days; Aleve okay    Nitrofurantoin Monohyd Macro Hives   Scopolamine    "Headache and redness of eyes"      Medication List    STOP taking these medications   CALTRATE 600+D PO     TAKE these medications   gabapentin 300 MG capsule Commonly known as: NEURONTIN Take a 300 mg capsule three times a day for two weeks following surgery.Then take a 300 mg capsule two times a day for two weeks. Then take a 300 mg capsule once a day for two weeks. Then discontinue.   ipratropium 0.03 % nasal spray Commonly known as: ATROVENT Place 1 spray into both nostrils daily as needed (allergies).   levothyroxine 137 MCG tablet Commonly known as: SYNTHROID Take 137 mcg by mouth daily before breakfast.   methocarbamol 500 MG tablet Commonly known as: ROBAXIN Take 1 tablet (500 mg total) by mouth every 6 (six) hours as needed for muscle spasms.   oxyCODONE 5 MG immediate release tablet Commonly known as: Oxy IR/ROXICODONE Take 1-2 tablets (5-10 mg total) by mouth every 4 (four) hours as needed for severe pain.   rivaroxaban 10 MG Tabs tablet Commonly known as: XARELTO Take 1 tablet (10 mg total) by mouth daily with breakfast for 20 days. Then take one 81 mg aspirin once a day for three weeks. Then discontinue aspirin.   traMADol 50 MG tablet Commonly known as: ULTRAM Take 1-2 tablets (50-100 mg total) by mouth every 6 (six) hours as needed for moderate pain.            Discharge Care  Instructions  (From admission, onward)         Start     Ordered   05/21/20 0000  Weight bearing as tolerated        05/21/20 0711   05/21/20 0000  Change dressing       Comments: You may remove the bulky bandage (ACE wrap and gauze) two days after surgery. You will have an adhesive waterproof bandage underneath. Leave this in place until your first follow-up appointment.   05/21/20 4034          Follow-up Information    Gaynelle Arabian, MD. Go on 06/04/2020.   Specialty: Orthopedic Surgery Why: You are scheduled for a post-operative appointment on 06-04-20 at 4:30 pm.  Contact information: [address] [place] [address] [phone]        Rosilyn Mings.. Go on 05/24/2020.   Why: You are scheduled for a physical therapy appointment on 05-24-20 at 7:30 am.  Contact information: [place] [address] [phone]               Signed: Theresa Duty, PA-C Orthopedic Surgery 05/28/2020, 7:57 AM

## 2020-05-30 DIAGNOSIS — M25562 Pain in left knee: Secondary | ICD-10-CM | POA: Diagnosis not present

## 2020-06-05 DIAGNOSIS — M25562 Pain in left knee: Secondary | ICD-10-CM | POA: Diagnosis not present

## 2020-06-11 DIAGNOSIS — M25562 Pain in left knee: Secondary | ICD-10-CM | POA: Diagnosis not present

## 2020-06-13 DIAGNOSIS — M25562 Pain in left knee: Secondary | ICD-10-CM | POA: Diagnosis not present

## 2020-06-20 DIAGNOSIS — M25562 Pain in left knee: Secondary | ICD-10-CM | POA: Diagnosis not present

## 2020-06-27 DIAGNOSIS — M25562 Pain in left knee: Secondary | ICD-10-CM | POA: Diagnosis not present

## 2020-06-28 ENCOUNTER — Telehealth: Payer: Self-pay | Admitting: Licensed Clinical Social Worker

## 2020-06-28 NOTE — Telephone Encounter (Signed)
Left message for [name] about rescheduling genetic counseling appointment to due to weather. Asked for her to call back.

## 2020-07-01 ENCOUNTER — Inpatient Hospital Stay: Payer: Medicare Other | Attending: Oncology | Admitting: Licensed Clinical Social Worker

## 2020-07-01 ENCOUNTER — Other Ambulatory Visit: Payer: Medicare Other

## 2020-07-01 ENCOUNTER — Inpatient Hospital Stay: Payer: Medicare Other | Admitting: Licensed Clinical Social Worker

## 2020-07-01 ENCOUNTER — Encounter: Payer: Self-pay | Admitting: Licensed Clinical Social Worker

## 2020-07-01 ENCOUNTER — Other Ambulatory Visit: Payer: Self-pay | Admitting: Licensed Clinical Social Worker

## 2020-07-01 DIAGNOSIS — Z836 Family history of other diseases of the respiratory system: Secondary | ICD-10-CM

## 2020-07-01 DIAGNOSIS — Z803 Family history of malignant neoplasm of breast: Secondary | ICD-10-CM | POA: Insufficient documentation

## 2020-07-01 DIAGNOSIS — Z8041 Family history of malignant neoplasm of ovary: Secondary | ICD-10-CM

## 2020-07-01 DIAGNOSIS — Z170 Estrogen receptor positive status [ER+]: Secondary | ICD-10-CM | POA: Diagnosis not present

## 2020-07-01 DIAGNOSIS — C50411 Malignant neoplasm of upper-outer quadrant of right female breast: Secondary | ICD-10-CM

## 2020-07-01 DIAGNOSIS — Z807 Family history of other malignant neoplasms of lymphoid, hematopoietic and related tissues: Secondary | ICD-10-CM | POA: Diagnosis not present

## 2020-07-01 DIAGNOSIS — Z800 Family history of malignant neoplasm of digestive organs: Secondary | ICD-10-CM | POA: Diagnosis not present

## 2020-07-01 NOTE — Progress Notes (Signed)
REFERRING PROVIDER: Brand Males, [address] [place],  [place] [id number]  PRIMARY PROVIDER:  Cari Caraway, MD  PRIMARY REASON FOR VISIT:  1. Malignant neoplasm of upper-outer quadrant of right breast in female, estrogen receptor positive ([place])   2. Family history of ovarian cancer   3. Family history of breast cancer   4. Family history of stomach cancer   5. Family history of multiple myeloma   6. Family history of pulmonary fibrosis    I connected with Elizabeth Ramirez on 07/01/2020 at 1:55 PM EDT by MyChart video conference and verified that I am speaking with the correct person using two identifiers.    Patient location: home Provider location: [place]:   Elizabeth Ramirez, a 75 y.o. female, was seen for a [facility] cancer genetics consultation at the request of Dr. Chase Caller due to a family history of history of pulmonary fibrosis.  Elizabeth Ramirez presents to clinic today to discuss the possibility of a hereditary predisposition to cancer, genetic testing, and to further clarify her future cancer risks, as well as potential cancer risks for family members.   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 At the age of 5, she had been diagnosed with DCIS as well. In 2016, she had negative genetic testing on the [facility] breast/ovarian 20 gene panel.   CANCER HISTORY:  Oncology History  Malignant neoplasm of upper-outer quadrant of right breast in female, estrogen receptor positive ([place])  09/2003 Cancer Diagnosis   History of right breast tubular tumor, grade 1, settting of DCIS, ER/PR+, HER2neu negative, S/P lumpectomy followed by 5 years of adjuvant anastrozole completeed June 2010   12/11/2014 Mammogram   Right breast: new calcifications warranting further evaluation with magnified views.   12/21/2014 Initial Biopsy   Right breast core needle bx: DCIS with necrosis and calcifications, grade  2-3, ER+ (95%), PR+ (60%)   01/01/2015 Procedure   Breast/Ovarian Gene panel ([facility]) reveals no clinically significant variant at ATM, BARD1, BRCA1, BRCA2, BRIP1, CDH1, CHEK2, FANCC, MLH1, MSH2, MSH6, NBN, PALB2, PMS2, PTEN, RAD51C, RAD51D, TP53, and XRCC2   02/01/2015 Breast MRI   1.1 cm area of non mass enhancement in the right 12:30 o'clock breast corresponding to the site of biopsy-proven DCIS.    02/01/2015 Clinical Stage   Stage 0: Tis N0   02/11/2015 Definitive Surgery   Right breast lumpectomy ([facility]): DCIS, intermediate grade, with comedonecrosis and associated calcifications   02/11/2015 Pathologic Stage   Stage 0: Tis Nx   03/07/2015 - 04/04/2015 Radiation Therapy   Adjuvant RT Pablo Ledger): Right breast/ 42.5 Gy at 2.5 Gy per fraction x 17 fractions.  Right breast boost/ 7.5 Gy at 2.5 Gy per fraction x 3 fractions    Anti-estrogen oral therapy   Anastrozole 1 mg daily to resume 07/07/2015 (one month following knee surgery)   07/04/2015 Survivorship   Survivorship care plan visit completed and copy given to patient      RISK FACTORS:  Menarche was at age 52.  OCP use for approximately 0 years.  Ovaries intact: yes.  Hysterectomy: no.  Menopausal status: postmenopausal.  HRT use: 0 years.   Past Medical History:  Diagnosis Date  . Allergy    tetracycline, levofloxacin, crabs & bay leaves  . Arthritis    Rt knee, Rt shoulder  . Breast cancer ([place]) 2016; 2005   Separate primaries; 2016 - DCIS of  R breast(surgery and radiation-last 04-04-15); ER/PR+ April 2005  . Cancer ([place])   . Ear infection 10/19/2013  . Family history of breast cancer   . Family history of multiple myeloma   . Family history of ovarian cancer   . Family history of pulmonary fibrosis   . Family history of stomach cancer   . Genetic testing 01/18/2015   Negative - no mutations found in any of 20 genes on the Breast/Ovarian Cancer Panel.  Additionally, no variants of uncertain significance  (VUSs) were found.  The Breast/Ovarian gene panel offered by [facility] [name], MD) includes sequencing and deletion/duplication analysis for the following 19 genes:  ATM, BARD1, BRCA1, BRCA2, BRIP1, CDH1, CHEK2, FANCC, MLH1, MSH2, MSH6, NBN, PALB2, PMS2, PTEN, RAD51C, RAD51D, TP53, and XRCC2.  This panel also includes deletion/duplication analysis (without sequencing) for one gene, EPCAM.  Date of report is January 14, 2015.    Marland Kitchen Heart murmur   . History of kidney stones    x1 '71  . Hypothyroid 07/29/2011  . Hypothyroidism   . Malignant neoplasm of upper-outer quadrant of right breast in female, estrogen receptor positive ([place]) 01/14/2015  . OA (osteoarthritis) of knee 05/27/2015  . Obesity, morbid, BMI 40.0-49.9 ([place]) 12/31/2015  . Other chronic pain   . Personal history of radiation therapy   . PONV (postoperative nausea and vomiting)    extreme nausea and vomiting - cannot tolerate scopalamine   . 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 Seasonal allergies   . Thyroid disease   . Vitamin D deficiency     Past Surgical History:  Procedure Laterality Date  . APPENDECTOMY  08/23/2006  . BREAST LUMPECTOMY Right 09/2003   DCIS  . BREAST LUMPECTOMY Right 2016  . BREAST LUMPECTOMY WITH NEEDLE LOCALIZATION Right 02/11/2015   Procedure: WIRE BRACKETED RIGHT BREAST LUMPECTOMY ;  Surgeon: Excell Seltzer, MD;  Location: [facility];  Service: General;  Laterality: Right;  . CATARACT EXTRACTION, BILATERAL  10-05-2017 left ; 11-09-2017 right   Dr Kandy Garrison   . colonoscopy with biopsy  12/28/2003   neg  . LYMPH NODE DISSECTION Right 02/23/2020   Procedure: right inguinal lymph node excision;  Surgeon: Alphonsa Overall, MD;  Location: [facility];  Service: General;  Laterality: Right;  . REVERSE SHOULDER ARTHROPLASTY Left 07/14/2018   Procedure: REVERSE SHOULDER  ARTHROPLASTY;  Surgeon: Justice Britain, MD;  Location: [facility];  Service: Orthopedics;  Laterality: Left;  Interscalene Block  . TONSILLECTOMY     1954  . TOTAL KNEE ARTHROPLASTY Right 05/27/2015   Procedure: RIGHT TOTAL KNEE ARTHROPLASTY;  Surgeon: Gaynelle Arabian, MD;  Location: [facility];  Service: Orthopedics;  Laterality: Right;  . TOTAL KNEE ARTHROPLASTY Left 05/20/2020   Procedure: TOTAL KNEE ARTHROPLASTY;  Surgeon: Gaynelle Arabian, MD;  Location: [facility];  Service: Orthopedics;  Laterality: Left;  72mn  . WISDOM TOOTH EXTRACTION Bilateral 1965    Social History   Socioeconomic History  . Marital status: DSoil scientist   Spouse name: Not on file  . Number of children: 0  . Years of education: Not on file  . Highest education level: Not on file  Occupational History  . Not on file  Tobacco Use  . Smoking status: Former Smoker    Packs/day: 1.00    Years: 9.00    Pack years: 9.00    Types: Cigarettes    Quit  date: 06/16/1983    Years since quitting: 37.0  . Smokeless tobacco: Never Used  Vaping Use  . Vaping Use: Never used  Substance and Sexual Activity  . Alcohol use: Yes    Alcohol/week: 2.0 standard drinks    Types: 2 Glasses of wine per week    Comment: Twice a week  . Drug use: No  . Sexual activity: Not on file  Other Topics Concern  . Not on file  Social History Narrative  . Not on file   Social Determinants of Health   Financial Resource Strain: Not on file  Food Insecurity: Not on file  Transportation Needs: Not on file  Physical Activity: Not on file  Stress: Not on file  Social Connections: Not on file     FAMILY HISTORY:  We obtained a detailed, 4-generation family history.  Significant diagnoses are listed below: Family History  Problem Relation Age of Onset  . Dementia Mother   . Other Mother 86       "spot on lung"; secondhand smoke exposure  . Pulmonary fibrosis Father   . Diabetes Brother   . Thyroid disease Brother   . Leukemia Brother 70        chronic lymphocytic   . Stomach cancer Maternal Grandmother        dx. 43s  . Heart attack Maternal Grandfather   . Diabetes Paternal Grandfather   . Stroke Paternal Grandfather   . Colitis Paternal Grandmother   . Parkinson's disease Maternal Aunt   . Diabetes Paternal Aunt   . Other Paternal Aunt        bowel obstruction  . Heart Problems Paternal Uncle   . Ovarian cancer Maternal Aunt        dx. 11s; paternal half sister of her mother  . Multiple myeloma Maternal Aunt   . Multiple myeloma Maternal Aunt 75  . Kidney failure Paternal Aunt   . Pulmonary fibrosis Paternal Aunt   . Stroke Paternal Aunt   . Heart Problems Paternal Aunt   . Emphysema Paternal Uncle   . Heart Problems Paternal Uncle   . Lung cancer Paternal Uncle        smoker  . Colon cancer Cousin   . Breast cancer Cousin        dx. 64s  . Pulmonary fibrosis Cousin    Elizabeth Ramirez has one brother who was diagnosed with CLL at the age of 74.  Her mother died at the age of 20 and had a history of a "spot on her lung" found at the age of 77.  Elizabeth Ramirez father died of pulmonary fibrosis at the age of 26.    Elizabeth Ramirez mother has two full siblings and eight paternal half siblings.  Her full brother and sister lived into their 66s, cancer-free.  One paternal half-sister (of four half-sisters) died of ovarian cancer in her 24s.  Elizabeth Ramirez has no information on this aunt's children.  Two more half-sisters were diagnosed with multiple myeloma in their 70s-80s.  One half-brother died of alcohol-related issues in his 29s.  The remaining half siblings are alive and cancer-free in their 66s.  Elizabeth Ramirez maternal grandfather died of a heart attack at 52.  Her maternal grandmother died of stomach cancer at the age of 96.  She has no information for the siblings or parents of her maternal grandmother.  To her knowledge, no additional maternal relatives have had cancer.  Elizabeth Ramirez father had two full  brothers,  three full sisters, two maternal half-sisters, and one maternal half-brother.  His full siblings all passed away between the ages of 28 and their 63s.  One of these full sisters passed away at 83 of diabetes and also had complications related to a bowel obstruction.  His maternal half-brother passed away of lung cancer at the age of 73, he was a smoker.  Half-sister died of pulmonary fibrosis. Elizabeth Ramirez has one first cousin, through one of the half-sisters who had colon cancer at an unknown age. The daughter of this aunt (Elizabeth Ramirez first cousin once-removed) was diagnosed with breast cancer in her 107s. This first cousin's brother died of pumonary fibrosis.  Both of Elizabeth Ramirez's paternal grandparents died in their 70s--her grandfather due to a stroke and her grandmother with colitis.  To her knowledge, no additional paternal relatives have had cancer.  Ms. Stiver is unaware of previous family history of genetic testing for hereditary cancer risks. Patient's maternal ancestors are of Irish/English descent, and paternal ancestors are of Irish/German descent. There is no reported Ashkenazi Jewish ancestry. There is no known consanguinity.  GENETIC COUNSELING ASSESSMENT: Elizabeth Ramirez is a 75 y.o. female with a personal and family history of cancer  which is somewhat suggestive of a hereditary cancer syndrome and predisposition to cancer. She also has a family history of pulmonary fibrosis that is somewhat suggestive of a hereditary cause. We, therefore, discussed and recommended the following at today's visit.   DISCUSSION: We discussed that approximately 5-10% of cancer is hereditary  Most cases of hereditary breast/ovarian cancer are associated with BRCA1/BRCA2 genes, although there are other genes associated with hereditary  cancer as well. While she did have testing in 2016, the testing we now have includes more genes and is more thorough. We discussed that testing is beneficial for several  reasons including knowing about other cancer risks, identifying potential screening and risk-reduction options that may be appropriate, and to understand if other family members could be at risk for cancer and allow them to undergo genetic testing.   We reviewed the characteristics, features and inheritance patterns of hereditary cancer syndromes. We also discussed genetic testing, including the appropriate family members to test, the process of testing, insurance coverage and turn-around-time for results. We discussed the implications of a negative, positive and/or variant of uncertain significant result. We recommended Ms. Sky pursue genetic testing for the [facility] Multi-Cancer+RNA gene panel.   The Multi-Cancer Panel offered by [facility] includes sequencing and/or deletion duplication testing of the following 84 genes: AIP, ALK, APC, ATM, AXIN2,BAP1,  BARD1, BLM, BMPR1A, BRCA1, BRCA2, BRIP1, CASR, CDC73, CDH1, CDK4, CDKN1B, CDKN1C, CDKN2A (p14ARF), CDKN2A (p16INK4a), CEBPA, CHEK2, CTNNA1, DICER1, DIS3L2, EGFR (c.2369C>T, p.Thr790Met variant only), EPCAM (Deletion/duplication testing only), FH, FLCN, GATA2, GPC3, GREM1 (Promoter region deletion/duplication testing only), HOXB13 (c.251G>A, p.Gly84Glu), HRAS, KIT, MAX, MEN1, MET, MITF (c.952G>A, p.Glu318Lys variant only), MLH1, MSH2, MSH3, MSH6, MUTYH, NBN, NF1, NF2, NTHL1, PALB2, PDGFRA, PHOX2B, PMS2, POLD1, POLE, POT1, PRKAR1A, PTCH1, PTEN, RAD50, RAD51C, RAD51D, RB1, RECQL4, RET, , RUNX1, SDHAF2, SDHA (sequence changes only), SDHB, SDHC, SDHD, SMAD4, SMARCA4, SMARCB1, SMARCE1, STK11, SUFU, TERC, TERT, TMEM127, TP53, TSC1, TSC2, VHL, WRN and WT1.  We also dicussed pulmonary fibrosis. Pulmonary fibrosis is a group of lung diseases characterized by development of scarring in the lungs.There are several different subtypes of pulmonary fibrosis, including idiopathic pulmonary fibrosis (IPF), with different causes that are defined based on their appearance on  imaging tests like a CT scan or how lung tissue looks under  the microscope after a lung biopsy.The incidence of pulmonary fibrosis in the general population is approximately 6-16/100,000 people in the [place].  Familial Pulmonary Fibrosis (FPF) is defined by having two or more first degree relatives (parent, sibling or child) in a family with a diagnosis of pulmonary fibrosis.In FPF, there is evidence that an underlying inherited genetic component may contribute to the development of pulmonary fibrosis, but environmental factors may also contribute. At present, several genes are known to be associated with FPF and account for approximately 20-25% of FPF cases.Thus, additional genes remain to be discovered.  Approximately 20% of patients with FPF have a change (or mutation) in one of their telomerase genes, including TERT, TERC, RTEL1, andPARN.Individuals with mutations in these genes may also be at risk for other health conditions such as low blood counts, early gray hair, or liver disease.Rarely, FPF is due to mutations in other genes such as SFTPCor SFTPA2. The inheritance pattern in FPF is unclear and may vary from family to family, but autosomal dominance with reduced penetrance appears most likely.This means that it only takes one copy of the genetic factor inherited from one parent to have risk for the condition and there is a 50/50 chance of inheriting that genetic factor.However, reduced penetrance means that not all individuals who inherit the genetic factor will develop symptoms or the disease.  We discussed the implications of a negative, positive and/or variant of uncertain significant result. We recommended she pursue testing through the [facility] pulmonary fibrosis panel/dyskeratosis panel: The Pulmonary Fibrosis and Dyskeratosis panel offered by [facility] includes sequencing and rearrangement analysis for the following 12 genes:  ACD, CTC1, DKC1, NHP2, NOP10, PARN, RTEL1, TERC,  TERT, TINF2, USB1, and WRAP53.  Based on Ms. Yanik's personal and family history of cancer, she meets medical criteria for genetic testing. Despite that she meets criteria, she may still have an out of pocket cost. She may have an OOP for the pulmonary fibrosis testing.   PLAN: After considering the risks, benefits, and limitations, Ms. Faraj provided informed consent to pursue genetic testing and the blood sample was sent to [facility] for analysis of the Multi-Cancer Panel+RNA, and the pulmonary fibrosis panel. Results should be available within approximately 2-3 weeks' time, at which point they will be disclosed by telephone to Ms. Munns, as will any additional recommendations warranted by these results. Ms. Fassnacht will receive a summary of her genetic counseling visit and a copy of her results once available. This information will also be available in Epic.   Lastly, we encouraged Ms. Callies to remain in contact with cancer genetics annually so that we can continuously update the family history and inform her of any changes in cancer genetics and testing that may be of benefit for this family.   Ms. Castille questions were answered to her satisfaction today. Our contact information was provided should additional questions or concerns arise. Thank you for the referral and allowing [place] to share in the care of your patient.   Faith Rogue, MS, [facility] Genetic Counselor [email or web address] Phone: [phone]  The patient was seen for a total of 25 minutes in virtual genetic counseling. [facility] intern Fredrik Rigger was also present and assisted with this case.  Dr. Grayland Ormond was available for discussion regarding this case.   _______________________________________________________________________ For Office Staff:  Number of people involved in session: 2 Was an Intern/ student involved with case: yes

## 2020-07-02 DIAGNOSIS — Z96652 Presence of left artificial knee joint: Secondary | ICD-10-CM | POA: Diagnosis not present

## 2020-07-02 DIAGNOSIS — M25562 Pain in left knee: Secondary | ICD-10-CM | POA: Diagnosis not present

## 2020-07-04 DIAGNOSIS — M25562 Pain in left knee: Secondary | ICD-10-CM | POA: Diagnosis not present

## 2020-07-08 ENCOUNTER — Encounter: Payer: Medicare Other | Admitting: Genetic Counselor

## 2020-07-08 ENCOUNTER — Other Ambulatory Visit: Payer: Medicare Other

## 2020-07-09 DIAGNOSIS — M25562 Pain in left knee: Secondary | ICD-10-CM | POA: Diagnosis not present

## 2020-07-11 DIAGNOSIS — M25562 Pain in left knee: Secondary | ICD-10-CM | POA: Diagnosis not present

## 2020-07-15 ENCOUNTER — Other Ambulatory Visit: Payer: Self-pay

## 2020-07-15 ENCOUNTER — Inpatient Hospital Stay: Payer: Medicare Other

## 2020-07-15 DIAGNOSIS — Z807 Family history of other malignant neoplasms of lymphoid, hematopoietic and related tissues: Secondary | ICD-10-CM | POA: Diagnosis not present

## 2020-07-15 DIAGNOSIS — Z836 Family history of other diseases of the respiratory system: Secondary | ICD-10-CM

## 2020-07-15 DIAGNOSIS — Z803 Family history of malignant neoplasm of breast: Secondary | ICD-10-CM

## 2020-07-15 DIAGNOSIS — Z8041 Family history of malignant neoplasm of ovary: Secondary | ICD-10-CM | POA: Diagnosis not present

## 2020-07-15 DIAGNOSIS — Z800 Family history of malignant neoplasm of digestive organs: Secondary | ICD-10-CM | POA: Diagnosis not present

## 2020-07-15 DIAGNOSIS — C50411 Malignant neoplasm of upper-outer quadrant of right female breast: Secondary | ICD-10-CM | POA: Diagnosis not present

## 2020-07-15 LAB — GENETIC SCREENING ORDER

## 2020-07-24 ENCOUNTER — Ambulatory Visit: Payer: Self-pay | Admitting: Licensed Clinical Social Worker

## 2020-07-24 ENCOUNTER — Telehealth: Payer: Self-pay | Admitting: Licensed Clinical Social Worker

## 2020-07-24 DIAGNOSIS — Z8041 Family history of malignant neoplasm of ovary: Secondary | ICD-10-CM

## 2020-07-24 DIAGNOSIS — Z1379 Encounter for other screening for genetic and chromosomal anomalies: Secondary | ICD-10-CM

## 2020-07-24 DIAGNOSIS — Z803 Family history of malignant neoplasm of breast: Secondary | ICD-10-CM

## 2020-07-24 DIAGNOSIS — Z800 Family history of malignant neoplasm of digestive organs: Secondary | ICD-10-CM

## 2020-07-24 DIAGNOSIS — C50411 Malignant neoplasm of upper-outer quadrant of right female breast: Secondary | ICD-10-CM

## 2020-07-24 DIAGNOSIS — Z170 Estrogen receptor positive status [ER+]: Secondary | ICD-10-CM

## 2020-07-24 DIAGNOSIS — Z836 Family history of other diseases of the respiratory system: Secondary | ICD-10-CM

## 2020-07-24 DIAGNOSIS — Z807 Family history of other malignant neoplasms of lymphoid, hematopoietic and related tissues: Secondary | ICD-10-CM

## 2020-07-24 NOTE — Telephone Encounter (Signed)
Revealed negative genetic testing.  Revealed that a VUS in ALK was identified. This normal result is reassuring and indicates that it is unlikely Elizabeth Ramirez's cancer is due to a hereditary cause.  It is unlikely that there is an increased risk of another cancer due to a mutation in one of these genes. It is also unlikely there is an increased risk of pulmonary fibrosis due to a mutation in one of these genes.  However, genetic testing is not perfect, and cannot definitively rule out a hereditary cause.  It will be important for her to keep in contact with genetics to learn if any additional testing may be needed in the future.

## 2020-07-24 NOTE — Progress Notes (Signed)
HPI:  Ms. Jaimes was previously seen in the [place] [facility] due to a personal and family history of cancer, family history of pulmonary fibrosis, and concerns regarding a hereditary predisposition to cancer. Please refer to our prior cancer genetics clinic note for more information regarding our discussion, assessment and recommendations, at the time. Ms. Bhullar recent genetic test results were disclosed to her, as were recommendations warranted by these results. These results and recommendations are discussed in more detail below.  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   12/21/2014 Initial Biopsy   Right breast core needle bx: DCIS with necrosis and calcifications, grade 2-3, ER+ (95%), PR+ (60%)   01/01/2015 Procedure   Breast/Ovarian Gene panel ([facility]) reveals no clinically significant variant at ATM, BARD1, BRCA1, BRCA2, BRIP1, CDH1, CHEK2, FANCC, MLH1, MSH2, MSH6, NBN, PALB2, PMS2, PTEN, RAD51C, RAD51D, TP53, and XRCC2   02/01/2015 Breast MRI   1.1 cm area of non mass enhancement in the right 12:30 o'clock breast corresponding to the site of biopsy-proven DCIS.    02/01/2015 Clinical Stage   Stage 0: Tis N0   02/11/2015 Definitive Surgery   Right breast lumpectomy ([facility]): DCIS, intermediate grade, with comedonecrosis and associated calcifications   02/11/2015 Pathologic Stage   Stage 0: Tis Nx   03/07/2015 - 04/04/2015 Radiation Therapy   Adjuvant RT [facility]): Right breast/ 42.5 Gy at 2.5 Gy per fraction x 17 fractions.  Right breast boost/ 7.5 Gy at 2.5 Gy  per fraction x 3 fractions    Anti-estrogen oral therapy   Anastrozole 1 mg daily to resume 07/07/2015 (one month following knee surgery)   07/04/2015 Survivorship   Survivorship care plan visit completed and copy given to patient     FAMILY HISTORY:  We obtained a detailed, 4-generation family history.  Significant diagnoses are listed below: Family History  Problem Relation Age of Onset  . Dementia Mother   . Other Mother 64       "spot on lung"; secondhand smoke exposure  . Pulmonary fibrosis Father   . Diabetes Brother   . Thyroid disease Brother   . Leukemia Brother 70       chronic lymphocytic   . Stomach cancer Maternal Grandmother        dx. 65s  . Heart attack Maternal Grandfather   . Diabetes Paternal Grandfather   . Stroke Paternal Grandfather   . Colitis Paternal Grandmother   . Parkinson's disease Maternal Aunt   . Diabetes Paternal Aunt   . Other Paternal Aunt        bowel obstruction  . Heart Problems Paternal Uncle   . Ovarian cancer Maternal Aunt        dx. 32s; paternal half sister of her mother  . Multiple myeloma Maternal Aunt   . Multiple myeloma Maternal Aunt 75  . Kidney failure Paternal Aunt   . Pulmonary fibrosis Paternal Aunt   . Stroke Paternal Aunt   . Heart Problems Paternal Aunt   . Emphysema Paternal Uncle   . Heart Problems Paternal Uncle   . Lung cancer Paternal Uncle        smoker  .  Colon cancer Cousin   . Breast cancer Cousin        dx. 35s  . Pulmonary fibrosis Cousin     [name] has one brother who was diagnosed with CLL at the age of 3. Her mother died at the age of 62 and had a history of a "spot on her lung" found at the age of 58. Ms. Shelby Mattocks father died of pulmonary fibrosis at the age of 49.   Ms. Shelby Mattocks mother has two full siblings and eight paternal half siblings. Her full brother and sister lived into their 23s, cancer-free. One paternal half-sister (of four half-sisters) died of ovarian cancer in her  5s. [name] has no information on this aunt's children. Two more half-sisters were diagnosed with multiple myeloma in their 70s-80s. One half-brother died of alcohol-related issues in his 71s. The remaining half siblings are alive and cancer-free in their 24s. Ms. Shelby Mattocks maternal grandfather died of a heart attack at 83. Her maternal grandmother died of stomach cancer at the age of 59. She has no information for the siblings or parents of her maternal grandmother. To her knowledge, no additional maternal relatives have had cancer.  Ms. Shelby Mattocks father had two full brothers, three full sisters, two maternal half-sisters, and one maternal half-brother. His full siblings all passed away between the ages of 77 and their 41s. One of these full sisters passed away at 47 of diabetes and also had complications related to a bowel obstruction. His maternal half-brother passed away of lung cancer at the age of 48, he was a smoker.  Half-sister died of pulmonary fibrosis. [name] has one first cousin, through one of the half-sisters who had colon cancer at an unknown age. The daughter of this aunt (Ms. Shelby Mattocks first cousin once-removed) was diagnosed with breast cancer in her 19s. This first cousin's brother died of pumonary fibrosis. Both of [name]'s paternal grandparents died in their 70s--her grandfather due to a stroke and her grandmother with colitis. To her knowledge, no additional paternal relatives have had cancer.  Ms. Burress is unaware of previous family history of genetic testing for hereditary cancer risks. Patient's maternal ancestors are of Irish/English descent, and paternal ancestors are of Irish/German descent. There is no reported Ashkenazi Jewish ancestry. There is no known consanguinity.  GENETIC TEST RESULTS: Genetic testing reported out on 07/23/2020 through the [facility] Multi-Cancer Panel + Pulmonary Fibrosis panel found no pathogenic mutations.   The  Multi-Cancer Panel + Pulmonary Fibrosis panel offered by [facility] includes sequencing and/or deletion duplication testing of the following 97 genes: ABCA3, ACD, AIP, ALK, APC*, ATM*, AXIN2, BAP1, BARD1, BLM, BMPR1A, BRCA1, BRCA2, BRIP1, CASR, CDC73, CDH1, CDK4, CDKN1B, CDKN1C, CDKN2A (p14ARF), CDKN2A (p16INK4a), CEBPA, CHEK2, CTC1, CTNNA1, DICER1*, DIS3L2*, DKC1, EGFR, EPCAM*, FH*, FLCN, GATA2, GPC3*, GREM1*, HOXB13, HRAS, KIT, MAX*, MEN1*, MET*, MITF, MLH1*, MSH2*, MSH3*, MSH6*, MUTYH, NAF1, NBN, NF1*, NF2, NHP2, NOP10, NTHL1, PALB2, PARN, PDGFRA, PHOX2B*, PMS2*, POLD1*, POLE, POT1, PRKAR1A, PTCH1, PTEN*, RAD50, RAD51C, RAD51D, RB1*, RECQL4*, RET, RTEL1, RUNX1, SDHA*, SDHAF2, SDHB, SDHC*, SDHD, SFTPC, SMAD4, SMARCA4, SMARCB1, SMARCE1, STK11, SUFU, TERC, TERT, TINF2, TMEM127, TP53, TSC1*, TSC2, USB1, VHL, WRAP53, WRN*, WT1.  The test report has been scanned into EPIC and is located under the Molecular Pathology section of the Results Review tab.  A portion of the result report is included below for reference.     We discussed with Ms. Wisser that because current genetic testing is not perfect, it is possible there may be a gene  mutation in one of these genes that current testing cannot detect, but that chance is small.  We also discussed, that there could be another gene that has not yet been discovered, or that we have not yet tested, that is responsible for the cancer diagnoses in the family. It is also possible there is a hereditary cause for the cancer in the family that Ms. Melrose did not inherit and therefore was not identified in her testing.  Therefore, it is important to remain in touch with cancer genetics in the future so that we can continue to offer Ms. Natzke the most up to date genetic testing.   Genetic testing did identify a variant of uncertain significance (VUS) in the ALK gene called [id number].  At this time, it is unknown if this variant is associated with increased cancer risk or if  this is a normal finding, but most variants such as this get reclassified to being inconsequential. It should not be used to make medical management decisions. With time, we suspect the lab will determine the significance of this variant, if any. If we do learn more about it, we will try to contact Ms. Munroe to discuss it further. However, it is important to stay in touch with [place] periodically and keep the address and phone number up to date.  ADDITIONAL GENETIC TESTING: We discussed with Ms. Mickley that her genetic testing was fairly extensive.  If there are genes identified to increase cancer risk that can be analyzed in the future, we would be happy to discuss and coordinate this testing at that time.    CANCER SCREENING RECOMMENDATIONS: Ms. Lazenby test result is considered negative (normal).  This means that we have not identified a hereditary cause for her  personal and family history of cancer at this time. Most cancers happen by chance and this negative test suggests that her cancer may fall into this category.    While reassuring, this does not definitively rule out a hereditary predisposition to cancer. It is still possible that there could be genetic mutations that are undetectable by current technology. There could be genetic mutations in genes that have not been tested or identified to increase cancer risk.  Therefore, it is recommended she continue to follow the cancer management and screening guidelines provided by her oncology and primary healthcare provider.   An individual's cancer risk and medical management are not determined by genetic test results alone. Overall cancer risk assessment incorporates additional factors, including personal medical history, family history, and any available genetic information that may result in a personalized plan for cancer prevention and surveillance.  RECOMMENDATIONS FOR FAMILY MEMBERS:  Relatives in this family might be at some increased risk of  developing cancer, over the general population risk, simply due to the family history of cancer.  We recommended female relatives in this family have a yearly mammogram beginning at age 93, or 14 years younger than the earliest onset of cancer, an annual clinical breast exam, and perform monthly breast self-exams. Female relatives in this family should also have a gynecological exam as recommended by their primary provider.  All family members should be referred for colonoscopy starting at age 61.    It is also possible there is a hereditary cause for the cancer in Ms. Harkey's family that she did not inherit and therefore was not identified in her.  Based on Ms. Bunch's family history, we recommended both maternal and paternal relatives have genetic counseling and testing. Ms. Kennerson will let  us know if we can be of any assistance in coordinating genetic counseling and/or testing for these family members.  Pulmonary Fibrosis  DISCUSSION Pulmonary fibrosis is a group of lung diseases characterized by development of scarring in the lungs. There are several different subtypes of pulmonary fibrosis, including idiopathic pulmonary fibrosis (IPF), with different causes that are defined based on their appearance on imaging tests like a CT scan or how lung tissue looks under the microscope after a lung biopsy. The incidence of pulmonary fibrosis in the general population is approximately 6-16/100,000 people in the [place].   Familial Pulmonary Fibrosis (FPF) is defined by having two or more first degree relatives (parent, sibling or child) in a family with a diagnosis of pulmonary fibrosis. In FPF, there is evidence that an underlying inherited genetic component may contribute to the development of pulmonary fibrosis, but environmental factors may also contribute. At present, several genes are known to be associated with FPF and account for approximately 20-25% of FPF cases. Thus, additional genes remain to  be discovered.   Approximately 20% of patients with FPF have a change (or mutation) in one of their telomerase genes, including TERT, TERC, RTEL1, and PARN. Individuals with mutations in these genes may also be at risk for other health conditions such as low blood counts, early gray hair, or liver disease. Rarely, FPF is due to mutations in other genes such as SFTPC or SFTPA2. The inheritance pattern in FPF is unclear and may vary from family to family, but autosomal dominance with reduced penetrance appears most likely. This means that it only takes one copy of the genetic factor inherited from one parent to have risk for the condition and there is a 50/50 chance of inheriting that genetic factor. However, reduced penetrance means that not all individuals who inherit the genetic factor will develop symptoms or the disease.   FAMILIAL PULMONARY FIBROSIS MANAGEMENT RECOMMENDATIONS: At present, there are no evidence-based recommendations for at risk individuals in FPF families. Thus, the following recommendations are based on expert opinion and should be modified for each individual by his/her personal physician.   Pulmonary screening: -Asymptomatic at risk individuals in FPF families can consider baseline pulmonary function tests (breathing tests) as well as a visit to a pulmonologist beginning around age 30 -Individuals who develop breathing difficulty symptoms such as shortness of breath or persistent cough at any age should seek evaluation to determine the cause -The use of chest xray or CT scan screening should be individualized -Intervals for screening can be determined based on findings on baseline evaluation and/or symptoms  Pulmonary disease prevention: -Avoid smoking and all smoke exposure -Avoid exposure to other known lung toxins such as asbestos and metal dusts  SUPPORT AND RESOURCES:If [name] isinterested in IPF-specific information and support, there is a group  called Pulmonary Fibrosis Foundation (http://www.pulmonaryfibrosis.org). To locate genetic counselors in other cities, visit the website of the [employer] (ArtistMovie.se) and Secretary/administrator for a Social worker by zip code.  FOLLOW-UP: Lastly, we discussed with Ms. Stanly that cancer genetics is a rapidly advancing field and it is possible that new genetic tests will be appropriate for her and/or her family members in the future. We encouraged her to remain in contact with cancer genetics on an annual basis so we can update her personal and family histories and let her know of advances in cancer genetics that may benefit this family.   Our contact number was provided. Ms. Wolters questions were answered to her satisfaction,  and she knows she is welcome to call us at anytime with additional questions or concerns.   Faith Rogue, MS, [facility] Genetic Counselor [email or web address] Phone: [phone]

## 2020-07-29 NOTE — Telephone Encounter (Signed)
Please advise on patient mychart message  Hi. I had both a phone call from the geneticist and an email from My Chart saying my test results were posted, but I am finding no result in My Chart.  Do you think it was posted to a different account in error?  Thank you.

## 2020-07-30 NOTE — Telephone Encounter (Signed)
It is there Elizabeth Ramirez notes from 07/24/20. She should reach out to them directly

## 2020-08-02 ENCOUNTER — Ambulatory Visit (HOSPITAL_COMMUNITY)
Admission: RE | Admit: 2020-08-02 | Discharge: 2020-08-02 | Disposition: A | Discharge status: Home / Self Care | Payer: Medicare Other | Source: Ambulatory Visit | Attending: Internal Medicine | Admitting: Internal Medicine

## 2020-08-02 ENCOUNTER — Other Ambulatory Visit: Payer: Self-pay

## 2020-08-02 DIAGNOSIS — I270 Primary pulmonary hypertension: Secondary | ICD-10-CM | POA: Diagnosis not present

## 2020-08-02 DIAGNOSIS — Z853 Personal history of malignant neoplasm of breast: Secondary | ICD-10-CM | POA: Diagnosis not present

## 2020-08-02 DIAGNOSIS — R918 Other nonspecific abnormal finding of lung field: Secondary | ICD-10-CM | POA: Insufficient documentation

## 2020-08-02 DIAGNOSIS — I10 Essential (primary) hypertension: Secondary | ICD-10-CM | POA: Diagnosis not present

## 2020-08-02 DIAGNOSIS — I2510 Atherosclerotic heart disease of native coronary artery without angina pectoris: Secondary | ICD-10-CM | POA: Diagnosis not present

## 2020-08-06 ENCOUNTER — Other Ambulatory Visit: Payer: Self-pay

## 2020-08-06 ENCOUNTER — Other Ambulatory Visit: Payer: Self-pay | Admitting: Internal Medicine

## 2020-08-06 ENCOUNTER — Encounter: Payer: Self-pay | Admitting: Internal Medicine

## 2020-08-06 ENCOUNTER — Ambulatory Visit (INDEPENDENT_AMBULATORY_CARE_PROVIDER_SITE_OTHER): Payer: Medicare Other | Admitting: Internal Medicine

## 2020-08-06 VITALS — BP 120/72 | HR 85 | Temp 97.0°F | Ht 64.0 in | Wt 219.4 lb

## 2020-08-06 DIAGNOSIS — R591 Generalized enlarged lymph nodes: Secondary | ICD-10-CM

## 2020-08-06 DIAGNOSIS — I2510 Atherosclerotic heart disease of native coronary artery without angina pectoris: Secondary | ICD-10-CM

## 2020-08-06 DIAGNOSIS — Z836 Family history of other diseases of the respiratory system: Secondary | ICD-10-CM | POA: Diagnosis not present

## 2020-08-06 DIAGNOSIS — R590 Localized enlarged lymph nodes: Secondary | ICD-10-CM | POA: Diagnosis not present

## 2020-08-06 DIAGNOSIS — Z853 Personal history of malignant neoplasm of breast: Secondary | ICD-10-CM | POA: Diagnosis not present

## 2020-08-06 DIAGNOSIS — R599 Enlarged lymph nodes, unspecified: Secondary | ICD-10-CM | POA: Diagnosis not present

## 2020-08-06 DIAGNOSIS — R911 Solitary pulmonary nodule: Secondary | ICD-10-CM

## 2020-08-06 DIAGNOSIS — I2584 Coronary atherosclerosis due to calcified coronary lesion: Secondary | ICD-10-CM

## 2020-08-06 NOTE — Patient Instructions (Addendum)
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 In addition some of the abdominal lymph nodes are larger. This could all be granuloma as evidenced by your right inguinal biopsy in September 2021. However in the setting of breast cancer other considerations need to be ruled out  Plan -Refer to Dr. Rolm Bookbinder in [place] surgery -for consideration of biopsy of the left axillary node -At this point in time we will hold off on any steroids for sarcoidosis consideration  Pulmonary nodules - multiple   -CT scan of the chest February 2022 shows resolution of the left lower lobe lymph node, persistence of the left upper lobe lymph node and multiple very tiny other nodules that are new compared to August 2021  Plan -Repeat CT scan of the chest without contrast in 6 months - 9 months  Family history of pulmonary fibrosis  CT scan of the chest February 2022 shows no evidence of pulmonary fibrosis -Genetic testing for pulmonary fibrosis is noncontributory in January 2022 -Glad you are feeling well without any shortness of breath  Plan -Expectant follow-up  Coronary artery calcification seen on CT scan February 2022  -Glad you not have chest pain but I do not see evidence of cardiac stress test.  Plan -Given risk factors refer to cardiology   Follow-up -Return in 3 months for clinical follow-up -

## 2020-08-06 NOTE — Addendum Note (Signed)
Addended by: Coralie Keens on: 08/06/2020 09:44 AM   Modules accepted: Orders

## 2020-08-06 NOTE — Progress Notes (Addendum)
OV 05/06/2020  Subjective:  Patient ID: Elizabeth Ramirez, female , DOB: 1946/02/27 , age 75 y.o. , MRN: [id number] , ADDRESS: [place] [id number] PCP Cari Caraway, MD Patient Care Team: Cari Caraway, MD as PCP - General (Family Medicine) Magrinat, Virgie Dad, MD as Consulting Physician (Oncology) Excell Seltzer, MD (Inactive) as Consulting Physician (General Surgery) Bobbye Charleston, MD as Consulting Physician (Obstetrics and Gynecology) Justice Britain, MD as Consulting Physician (Orthopedic Surgery)  This Provider for this visit: Treatment Team:  Attending Provider: Brand Males, MD    05/06/2020 -   Chief Complaint  Patient presents with  . Consult    Pt is being referred by Dr. [name] for possible sarcoidosis. Pt denies any complaints of cough, SOB, or CP.     HPI SAMYAH BILBO 75 y.o. -  Background history from the oncologist -in September 2021   (1) status post right lumpectomy April 2005 for a pT1a pN0, stage IA invasive breast cancer, estrogen and progesterone receptor positive, HER-2 negative, in the setting of DCIS;              (a) did not receive radiation             (b) status post anastrozole for 5 years completed June 2010  (2) status post right breast upper outer quadrant biopsy 12/21/2014 for ductal carcinoma in situ, grade 2 or 3, estrogen and progesterone receptor positive.  (3) Status post right lumpectomy 02/11/2015 showing ductal carcinoma in situ, intermediate grade, measuring 0.9 cm, with close but negative margins  (4) adjuvant radiation: 03/07/2015-04/04/2015  Right breast/ 42.5 Gy at 2.5 Gy per fraction x 17 fractions.  Right breast boost/ 7.5 Gy at 2.5 Gy per fraction x 3 fractions  (5) genetic testing 01/14/2015 through the [facility] Panel offered by [facility] [name], MD) showed no deleterious mutations in ATM, BARD1, BRCA1, BRCA2, BRIP1, CDH1, CHEK2, FANCC,  MLH1, MSH2, MSH6, NBN, PALB2, PMS2, PTEN, RAD51C, RAD51D, TP53, and XRCC2. This panel also includes deletion/duplication analysis (without sequencing) for one gene, EPCAM.  (6) resumed anastrozole 07/06/2014, discontinued March 2017 with arthralgias and myalgias developing              (a) started letrozole 10/14/2015--discontinued after a brief course secondary to side effects             (b) Bone density 03/29/2015 was normal, with the lowest T score at 0.4   Elizabeth Ramirez is now just over 5 years out from definitive surgery for her breast cancer with no evidence of disease recurrence.  This is favorable.  We were preparing to discharge her from follow-up when she was found to have diffuse lymphadenopathy.  We obtained a lymph node biopsy which shows no evidence of breast cancer or lymphoma.  There are granulomas but no evidence of tuberculosis or other chronic infection.  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  HPI 05/06/2020   -New consult evaluation.  Referred by Dr. [name]  75 year old female who is dealing with left knee issue and is awaiting a left knee replacement May 20, 2020 with Dr. [name].  She is under surveillance for breast cancer and right when she is going to be discharged from long-term follow-up lymphadenopathy was described and discovered.  Started off with a lung nodule discovery in the left lower lobe 11 mm along with axillary node discovery on a CT chest.  This resulted in PET scan that is described below.  There is diffuse uptake particularly in the abdomen and the liver and the splenic areas.  She also had a inguinal node.  This was then biopsied in September 2021 and showed granuloma.  Sarcoidosis under consideration but she has granulomatous adenitis.  She basically has no respiratory symptoms.  She been referred here for evaluation of this.  There is no hemoptysis or shortness of breath or wheezing or cough.  There is no weight loss or fever or chills or  any B symptoms.  Currently she is most concerned about getting and going through her left knee replacement.  Rest of the history is documented as a copy and paste from Dr. [name]  She is concerned about these findings.  Particularly she reports a strong family so pulmonary fibrosis in her dad and a paternal aunt and a paternal cousin who is the niece of the paternal aunt and her dad.   - IMPRESSION: CT chest January 26, 2020 11 x 7 mm irregular nodule in the superior segment left lower lobe, new. This is indeterminate but raises concern for primary bronchogenic neoplasm or metastasis. This is just at the size threshold for PET sensitivity. Consider PET-CT for further evaluation.  Mildly progressive left axillary/subpectoral nodes measuring up to 11 mm short axis. Contralateral nodal metastases would be unusual. Reactive nodes or low-grade lymphoproliferative disorder are also possible. This can be evaluated at the time of PET-CT.  10 mm short axis left celiac axis node, also minimally progressive, warranting attention at the time of PET-CT.  Aortic Atherosclerosis (ICD10-I70.0) and Emphysema (ICD10-J43.9).   Electronically Signed   By: [name] M.D.   On: 01/26/2020 16:22  PET scan February 08, 2020  IMPRESSION: 1. Generalized nodal enlargement and or hypermetabolic lymph nodes with splenic enlargement and increased metabolic activity findings are most suggestive of lymphoproliferative disorder/lymphoma. RIGHT groin lymph node with considerable FDG uptake may be the best target for sampling. Would avoid LEFT axillary lymph nodes given that the patient reportedly has had recent COVID-19 vaccination which can be confounding on PET scan. Correlate with side of vaccine administration. Metastatic disease is also considered though would be unusual in the setting of breast cancer with pelvic node predominant disease and splenic enlargement with mild hypermetabolism. 2.  Small lymph nodes in the neck and potential RIGHT parotid lesion, this could be related but would suggest dedicated assessment of the parotid gland for further evaluation, this could be performed with CT or ultrasound as an initial means in further evaluating this area. 3. Small lung nodule in the LEFT lower lobe is nonspecific in terms of FDG uptake suggesting indolent process. Morphologic features raising the question of bronchogenic neoplasm as outlined previously. 4. Mild increased FDG uptake in the RIGHT breast may relate to recent biopsy at the site of previous surgery, refer to biopsy results and mammography for further information. 5. Lobular hepatic contours could also be seen in the setting of liver disease, correlate with any clinical or laboratory evidence of liver disease. Liver disease could be an alternative explanation for splenic enlargement. 6. Activation of brown fat along the spine and potentially in the area of the occipital region and posterior neck.   Electronically Signed   By: [name] M.D.   On: 02/08/2020 16:59  A. LYMPH NODE, RIGHT INGUINAL, BIOPSY: February 23, 2020 - Lymph node with follicular hyperplasia, scattered giant cells and  epithelioid granulomas  - No morphologic or immunophenotypic evidence of lymphoma  - See comment   Famil hx  Sanders father died at the age of 11 from pulmonary fibrosis.  A paternal aunt also had pulmonary fibrosis.  A paternal cousin also has pulmonary fibrosis.  Her mother died at age 90 with severe dementia. The patient had one brother no sisters. Her maternal grandmother died from stomach cancer the age of 61. The patient's mother had a half sister with ovarian cancer.   SOCIAL HISTORY:  Works as a Cabin crew, Publishing copy in renovations. She retired from [employer] as of 2018 and is now doing some Electronics engineer. At home it's her and [name] , who also is in real estate, with her own investment  business..    GENETIC clinic 07/24/20  GENETIC TEST RESULTS: Genetic testing reported out on 07/23/2020 through the [facility] Multi-Cancer Panel + Pulmonary Fibrosis panel found no pathogenic mutations.   The Multi-Cancer Panel + Pulmonary Fibrosis panel offered by [facility] includes sequencing and/or deletion duplication testing of the following 97 genes: ABCA3, ACD, AIP, ALK, APC*, ATM*, AXIN2, BAP1, BARD1, BLM, BMPR1A, BRCA1, BRCA2, BRIP1, CASR, CDC73, CDH1, CDK4, CDKN1B, CDKN1C, CDKN2A (p14ARF), CDKN2A (p16INK4a), CEBPA, CHEK2, CTC1, CTNNA1, DICER1*, DIS3L2*, DKC1, EGFR, EPCAM*, FH*, FLCN, GATA2, GPC3*, GREM1*, HOXB13, HRAS, KIT, MAX*, MEN1*, MET*, MITF, MLH1*, MSH2*, MSH3*, MSH6*, MUTYH, NAF1, NBN, NF1*, NF2, NHP2, NOP10, NTHL1, PALB2, PARN, PDGFRA, PHOX2B*, PMS2*, POLD1*, POLE, POT1, PRKAR1A, PTCH1, PTEN*, RAD50, RAD51C, RAD51D, RB1*, RECQL4*, RET, RTEL1, RUNX1, SDHA*, SDHAF2, SDHB, SDHC*, SDHD, SFTPC, SMAD4, SMARCA4, SMARCB1, SMARCE1, STK11, SUFU, TERC, TERT, TINF2, TMEM127, TP53, TSC1*, TSC2, USB1, VHL, WRAP53, WRN*, WT1   OV 08/06/2020  Subjective:  Patient ID: Elizabeth Ramirez, female , DOB: June 09, 1946 , age 64 y.o. , MRN: [id number] , AD[address]ESS: [address] [place] [id number] PCP Cari Caraway, MD Patient Care Team: Cari Caraway, MD as PCP - General (Family Medicine) Magrinat, Virgie Dad, MD as Consulting Physician (Oncology) Excell Seltzer, MD (Inactive) as Consulting Physician (General Surgery) Bobbye Charleston, MD as Consulting Physician (Obstetrics and Gynecology) Justice Britain, MD as Consulting Physician (Orthopedic Surgery)  This Provider for this visit: Treatment Team:  Attending Provider: Brand Males, MD    08/06/2020 -   Chief Complaint  Patient presents with  . Follow-up    Doing well, no issues   Multiple pulmonary nodules in the setting of breast cancer and also family history of pulmonary fibrosis and also right inguinal lymphadenopathy September  2021 there is granuloma  HPI LOREDANA MEDELLIN 75 y.o. -returns for follow-up. In the interim we did testing for granuloma that includes QuantiFERON gold angiotensin-converting enzyme. These are normal. Immunoglobulins are normal. In the interim she has had a left knee surgery. Her mobility has improved. Her shortness of breath is not there. She feels happier. She had CT scan of the chest in February 2022 that I personally visualized with her. This is for multiple pulmonary nodules. The concerning left lower lobe nodule is resolved. The small left upper lobe nodule persists. There is also multiple 5 mm nodules. These are all n in comparison to August 2021. She has a history of breast cancer. Her surgeon Dr. Lucia Gaskins is retired. She wants to see Dr. Donne Hazel. She otherwise feels good. She had genetic testing for pulmonary fibrosis and this was reviewed with a genetics counselor by email discussion. In addition I reviewed the note and this was noncontributory. It is documented above. There is no coughing or wheezing. She is somewhat reluctant to do daily steroids.  Other findings on the CT scan of the chest include coronary artery  calcification which I am addressing for the first time.  She has air trapping but she is asymptomatic.  She has pulmonary artery enlargement but she is asymptomatic. CT Chest data  CT Chest High Resolution  Result Date: 08/02/2020 CLINICAL DATA:  Follow-up lung nodule, history of right breast cancer EXAM: CT CHEST WITHOUT CONTRAST TECHNIQUE: Multidetector CT imaging of the chest was performed following the standard protocol without intravenous contrast. High resolution imaging of the lungs, as well as inspiratory and expiratory imaging, was performed. COMPARISON:  CT chest, 01/26/2020, PET-CT, 02/08/2020 FINDINGS: Cardiovascular: Left coronary artery calcifications. Normal heart size. No pericardial effusion. Enlargement of the main pulmonary artery measuring up to 3.6 cm.  Mediastinum/Nodes: Left axillary and subpectoral lymph nodes are enlarged compared to prior examination, an index axillary node measuring 2.1 x 1.6 cm, previously 1.7 x 1.1 cm when measured similarly (series 2, image 21). Thyroid gland, trachea, and esophagus demonstrate no significant findings. Lungs/Pleura: Interval resolution of previously seen spiculated nodule of the superior segment left lower lobe (series 4, image 124). Unchanged spiculated nodule in the anterior left upper lobe measuring 5 mm (series 4, image 115). There are multiple new scattered small pulmonary nodules, for example a 5 mm nodule of the peripheral left upper lobe (series 4, image 150) and a 3 mm pulmonary nodule of the left lung base (series 4, image 239). Minimal subpleural radiation fibrosis of the anterior right lung. Mild, lobular air trapping on expiratory phase imaging. No pleural effusion or pneumothorax. Upper Abdomen: No acute abnormality. Multiple celiac axis and retroperitoneal lymph nodes in the partially included upper abdomen are increased in size compared to prior examination, an index left retroperitoneal node measuring 1.2 x 1.0 cm, previously 0.6 x 0.6 cm (series 2, image 166). Musculoskeletal: No chest wall mass or suspicious bone lesions identified. Postoperative findings of prior right lumpectomy. IMPRESSION: 1. Interval resolution of previously seen spiculated nodule of the superior segment left lower lobe, consistent with resolved nonspecific infection or inflammation. 2. Unchanged spiculated nodule in the anterior left upper lobe measuring 5 mm, likely benign given established stability. 3. There are multiple new scattered small pulmonary nodules, measuring 5 mm or smaller. These are nonspecific and statistically most likely infectious or inflammatory, although given history of malignancy, metastatic disease is not excluded. 4. Interval enlargement of left axillary, subpectoral, celiac axis, and retroperitoneal lymph  nodes, which remain generally concerning for lymphoma or metastatic disease. 5. Mild, lobular air trapping on expiratory phase imaging, consistent with small airways disease. 6. Enlargement of the main pulmonary artery, as can be seen in pulmonary hypertension. 7. Coronary artery disease. Electronically Signed   By: Eddie Candle M.D.   On: 08/02/2020 15:25      PFT  No flowsheet data found.     has a past medical history of Allergy, Arthritis, Breast cancer ([place]) (2016; 2005), Cancer [facility]), Ear infection (10/19/2013), Family history of breast cancer, Family history of multiple myeloma, Family history of ovarian cancer, Family history of pulmonary fibrosis, Family history of stomach cancer, Genetic testing (01/18/2015), Heart murmur, History of kidney stones, Hypothyroid (07/29/2011), Hypothyroidism, Malignant neoplasm of upper-outer quadrant of right breast in female, estrogen receptor positive ([place]) (01/14/2015), OA (osteoarthritis) of knee (05/27/2015), Obesity, morbid, BMI 40.0-49.9 ([place]) (12/31/2015), Other chronic pain, Personal history of radiation therapy, PONV (postoperative nausea and vomiting), 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   reports that she quit smoking about 37 years ago.  Her smoking use included cigarettes. She has a 9.00 pack-year smoking history. She has never used smokeless tobacco.  Past Surgical History:  Procedure Laterality Date  . APPENDECTOMY  08/23/2006  . BREAST LUMPECTOMY Right 09/2003   DCIS  . BREAST LUMPECTOMY Right 2016  . BREAST LUMPECTOMY WITH NEEDLE LOCALIZATION Right 02/11/2015   Procedure: WIRE BRACKETED RIGHT BREAST LUMPECTOMY ;  Surgeon: Excell Seltzer, MD;  Location: [facility];  Service: General;  Laterality: Right;  . CATARACT EXTRACTION, BILATERAL  10-05-2017 left ; 11-09-2017 right   Dr Kandy Garrison   . colonoscopy with biopsy   12/28/2003   neg  . LYMPH NODE DISSECTION Right 02/23/2020   Procedure: right inguinal lymph node excision;  Surgeon: Alphonsa Overall, MD;  Location: [facility];  Service: General;  Laterality: Right;  . REVERSE SHOULDER ARTHROPLASTY Left 07/14/2018   Procedure: REVERSE SHOULDER ARTHROPLASTY;  Surgeon: Justice Britain, MD;  Location: [facility];  Service: Orthopedics;  Laterality: Left;  Interscalene Block  . TONSILLECTOMY     1954  . TOTAL KNEE ARTHROPLASTY Right 05/27/2015   Procedure: RIGHT TOTAL KNEE ARTHROPLASTY;  Surgeon: Gaynelle Arabian, MD;  Location: [facility];  Service: Orthopedics;  Laterality: Right;  . TOTAL KNEE ARTHROPLASTY Left 05/20/2020   Procedure: TOTAL KNEE ARTHROPLASTY;  Surgeon: Gaynelle Arabian, MD;  Location: [facility];  Service: Orthopedics;  Laterality: Left;  91mn  . WISDOM TOOTH EXTRACTION Bilateral 1965    Allergies  Allergen Reactions  . Levofloxacin Other (See Comments)    Affected tendons, severe pain and swelling  . Tetracyclines & Related Swelling    tongue swelling and peeling  . Other Diarrhea, Rash and Other (See Comments)    Crabs - diarrhea. Bay leaves - rash in her mouth  . Ciprofloxacin     Tendon problems   . Flonase [Fluticasone]     Nasal spray cause nose bleeds  . Ibuprofen     Gets mouth ulcers if she takes for more than 3 days; Aleve okay   . Nitrofurantoin Monohyd Macro Hives  . Scopolamine     "Headache and redness of eyes"    Immunization History  Administered Date(s) Administered  . Influenza Split 03/15/2008, 02/22/2015, 03/16/2020  . Influenza, High Dose Seasonal PF 03/24/2017, 04/01/2018, 02/09/2019, 02/15/2020  . Influenza,inj,Quad PF,6+ Mos 04/06/2012  . Influenza,inj,quad, With Preservative 03/06/2014, 03/19/2016, 02/13/2017, 03/15/2018, 03/16/2019  . PFIZER(Purple Top)SARS-COV-2 Vaccination 07/07/2019, 07/28/2019, 03/12/2020  . Pneumococcal Conjugate-13 03/14/2015  . Pneumococcal Polysaccharide-23 05/23/2010, 03/24/2017   . Td 07/30/2003  . Tdap 07/29/2009  . Zoster 07/29/2005, 04/19/2020    Family History  Problem Relation Age of Onset  . Dementia Mother   . Other Mother 88      "spot on lung"; secondhand smoke exposure  . Pulmonary fibrosis Father   . Diabetes Brother   . Thyroid disease Brother   . Leukemia Brother 70       chronic lymphocytic   . Stomach cancer Maternal Grandmother        dx. 34s . Heart attack Maternal Grandfather   . Diabetes Paternal Grandfather   . Stroke Paternal Grandfather   . Colitis Paternal Grandmother   . Parkinson's disease Maternal Aunt   . Diabetes Paternal Aunt   . Other Paternal Aunt        bowel obstruction  . Heart Problems Paternal Uncle   . Ovarian cancer Maternal Aunt        dx. 528s paternal half sister of her mother  .  Multiple myeloma Maternal Aunt   . Multiple myeloma Maternal Aunt 75  . Kidney failure Paternal Aunt   . Pulmonary fibrosis Paternal Aunt   . Stroke Paternal Aunt   . Heart Problems Paternal Aunt   . Emphysema Paternal Uncle   . Heart Problems Paternal Uncle   . Lung cancer Paternal Uncle        smoker  . Colon cancer Cousin   . Breast cancer Cousin        dx. 65s  . Pulmonary fibrosis Cousin      Current Outpatient Medications:  .  Calcium Carbonate-Vitamin D 600-400 MG-UNIT tablet, 2 tablets, Disp: , Rfl:  .  levothyroxine (SYNTHROID, LEVOTHROID) 137 MCG tablet, Take 137 mcg by mouth daily before breakfast., Disp: , Rfl:       Objective:   Vitals:   08/06/20 0905  BP: 120/72  Pulse: 85  Temp: (!) 97 F (36.1 C)  TempSrc: Oral  SpO2: 97%  Weight: 219 lb 6.4 oz (99.5 kg)  Height: _0  (1.626 m)    Estimated body mass index is 37.66 kg/m as calculated from the following:   Height as of this encounter: _1  (1.626 m).   Weight as of this encounter: 219 lb 6.4 oz (99.5 kg).  _2 @  [facility] Weights   08/06/20 0905  Weight: 219 lb 6.4 oz (99.5 kg)     Physical Exam   General: No distress.  obese Neuro: Alert and Oriented x 3. GCS 15. Speech normal Psych: Pleasant Resp:  Barrel Chest -no.  Wheeze - no, Crackles - no, No overt respiratory distress CVS: Normal heart sounds. Murmurs - no Ext: Stigmata of Connective Tissue Disease - no HEENT: Normal upper airway. PEERL +. No post nasal drip        Assessment:       ICD-10-CM   1. Axillary lymphadenopathy  R59.0 Ambulatory referral to General Surgery  2. Lymphadenopathy  R59.1 Ambulatory referral to General Surgery  3. Granulomatous adenopathy  R59.9   4. History of breast cancer  Z85.3   5. Pulmonary nodule  R91.1 CT Chest Wo Contrast  6. Family history of pulmonary fibrosis  Z83.6   7. Coronary artery calcification seen on CAT scan  I25.10        Plan:     Patient Instructions  Axillary lymphadenopathy Lymphadenopathy Granulomatous adenopathy - right inguinal node sep 2021 History of breast cancer  -There is enlargement of the left axillary lymph node compared to August 2021. In addition some of the abdominal lymph nodes are larger. This could all be granuloma as evidenced by your right inguinal biopsy in September 2021. However in the setting of breast cancer other considerations need to be ruled out  Plan -Refer to Dr. Rolm Bookbinder in [place] surgery -for consideration of biopsy of the left axillary node -At this point in time we will hold off on any steroids for sarcoidosis consideration  Pulmonary nodules - multiple   -CT scan of the chest February 2022 shows resolution of the left lower lobe lymph node, persistence of the left upper lobe lymph node and multiple very tiny other nodules that are new compared to August 2021  Plan -Repeat CT scan of the chest without contrast in 6 months - 9 months  Family history of pulmonary fibrosis  CT scan of the chest February 2022 shows no evidence of pulmonary fibrosis -Genetic testing for pulmonary fibrosis is noncontributory in January 2022 -Glad  you are feeling well  without any shortness of breath  Plan -Expectant follow-up  Coronary artery calcification seen on CT scan February 2022  -Glad you not have chest pain but I do not see evidence of cardiac stress test.  Plan -Given risk factors refer to cardiology   Follow-up -Return in 3 months for clinical follow-up -      SIGNATURE  CT chest documented and then  Dr. Brand Males, M.D., F.C.C.P,  Pulmonary and Critical Care Medicine Staff Physician, [place] Director - Interstitial Lung Disease  Program  [facility] [place] [facility] [place], [place], [address]  Pager: [phone], If no answer or between  15:00h - 7:00h: call [phone] Telephone: [phone]  9:57 AM 08/06/2020

## 2020-09-02 ENCOUNTER — Ambulatory Visit (INDEPENDENT_AMBULATORY_CARE_PROVIDER_SITE_OTHER): Payer: Medicare Other | Admitting: Cardiovascular Disease

## 2020-09-02 ENCOUNTER — Encounter: Payer: Self-pay | Admitting: Cardiovascular Disease

## 2020-09-02 ENCOUNTER — Other Ambulatory Visit: Payer: Self-pay

## 2020-09-02 VITALS — BP 116/82 | HR 83 | Ht 64.0 in | Wt 214.0 lb

## 2020-09-02 DIAGNOSIS — I2584 Coronary atherosclerosis due to calcified coronary lesion: Secondary | ICD-10-CM

## 2020-09-02 DIAGNOSIS — I2510 Atherosclerotic heart disease of native coronary artery without angina pectoris: Secondary | ICD-10-CM

## 2020-09-02 DIAGNOSIS — E782 Mixed hyperlipidemia: Secondary | ICD-10-CM | POA: Diagnosis not present

## 2020-09-02 DIAGNOSIS — Z8639 Personal history of other endocrine, nutritional and metabolic disease: Secondary | ICD-10-CM

## 2020-09-02 DIAGNOSIS — E785 Hyperlipidemia, unspecified: Secondary | ICD-10-CM | POA: Insufficient documentation

## 2020-09-02 NOTE — Progress Notes (Signed)
Cardiology Office Note:    Date:  09/02/2020   ID:  Elizabeth Ramirez, DOB [birth year], MRN [id number]  PCP:  Cari Caraway, MD  Cardiologist:  Mertie Moores, MD    Referring MD: Brand Males, MD   Chief Complaint  Patient presents with  . Heart Murmur    Previous notes.   Elizabeth Ramirez is a 75 y.o. female who is being seen today for the evaluation of heart murmur  at the request of Brand Males, MD.   Elizabeth Ramirez is a 75 y.o. female with a hx of hypothyroidism, breast cancer, obesity, right total knee replacement who we are asked to see today for evaluation of heart murmur.  Heart murmur was incidentally found on physical exam.  She denies having any episodes of chest pain or shortness of breath. She goes to water aerobics on a regular basis.  No cough or cold symptoms.   No seasonal allergies   Has had breast cancer twice ( right breast)   September 02, 2020:   Elizabeth Ramirez is seen today for follow up of her heart murmur , she is [employer] partner .  Had a knee replacement   Sees Dr. [name].   A recent CT scan showed some coronary artery calcifications Has enlarged lymph nodes.   Thought to have sarcoidosis Has been exercising .  No cp with exercise , no DOE , 30 minutes, 3 times a week   Echo from 2018: normal LV function , Mild MR, mild TR   Has lost 50 lbs from Jan. 2021 Is not interested in starting a statin  Has agreed to do a coronary calcium score   Past Medical History:  Diagnosis Date  . Allergy    tetracycline, levofloxacin, crabs & bay leaves  . Arthritis    Rt knee, Rt shoulder  . 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 Cancer ([place])   . Ear infection 10/19/2013  . Family history of breast cancer   . Family history of multiple myeloma   . Family history of ovarian cancer   . Family history of pulmonary fibrosis   . Family history of stomach  cancer   . Genetic testing 01/18/2015   Negative - no mutations found in any of 20 genes on the Breast/Ovarian Cancer Panel.  Additionally, no variants of uncertain significance (VUSs) were found.  The Breast/Ovarian gene panel offered by [facility] Elizabeth Pigeon, MD) includes sequencing and deletion/duplication analysis for the following 19 genes:  ATM, BARD1, BRCA1, BRCA2, BRIP1, CDH1, CHEK2, FANCC, MLH1, MSH2, MSH6, NBN, PALB2, PMS2, PTEN, RAD51C, RAD51D, TP53, and XRCC2.  This panel also includes deletion/duplication analysis (without sequencing) for one gene, EPCAM.  Date of report is January 14, 2015.    Marland Kitchen Heart murmur   . History of kidney stones    x1 '71  . Hypothyroid 07/29/2011  . Hypothyroidism   . Malignant neoplasm of upper-outer quadrant of right breast in female, estrogen receptor positive ([place]) 01/14/2015  . OA (osteoarthritis) of knee 05/27/2015  . Obesity, morbid, BMI 40.0-49.9 ([place]) 12/31/2015  . Other chronic pain   . Personal history of radiation therapy   . PONV (postoperative nausea and vomiting)    extreme nausea and vomiting - cannot tolerate scopalamine   . 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 Seasonal  allergies   . Thyroid disease   . Vitamin D deficiency     Past Surgical History:  Procedure Laterality Date  . APPENDECTOMY  08/23/2006  . BREAST LUMPECTOMY Right 09/2003   DCIS  . BREAST LUMPECTOMY Right 2016  . BREAST LUMPECTOMY WITH NEEDLE LOCALIZATION Right 02/11/2015   Procedure: WIRE BRACKETED RIGHT BREAST LUMPECTOMY ;  Surgeon: Excell Seltzer, MD;  Location: [place];  Service: General;  Laterality: Right;  . CATARACT EXTRACTION, BILATERAL  10-05-2017 left ; 11-09-2017 right   Dr Kandy Garrison   . colonoscopy with biopsy  12/28/2003   neg  . LYMPH NODE DISSECTION Right 02/23/2020   Procedure: right inguinal lymph node excision;  Surgeon: Alphonsa Overall, MD;  Location: [place];  Service: General;  Laterality: Right;  . REVERSE SHOULDER ARTHROPLASTY Left 07/14/2018   Procedure: REVERSE SHOULDER ARTHROPLASTY;  Surgeon: Justice Britain, MD;  Location: [facility];  Service: Orthopedics;  Laterality: Left;  Interscalene Block  . TONSILLECTOMY     1954  . TOTAL KNEE ARTHROPLASTY Right 05/27/2015   Procedure: RIGHT TOTAL KNEE ARTHROPLASTY;  Surgeon: Gaynelle Arabian, MD;  Location: [facility];  Service: Orthopedics;  Laterality: Right;  . TOTAL KNEE ARTHROPLASTY Left 05/20/2020   Procedure: TOTAL KNEE ARTHROPLASTY;  Surgeon: Gaynelle Arabian, MD;  Location: [facility];  Service: Orthopedics;  Laterality: Left;  72mn  . WISDOM TOOTH EXTRACTION Bilateral 1965    Current Medications: Current Meds  Medication Sig  . Calcium Carbonate-Vitamin D 600-400 MG-UNIT tablet 2 tablets  . levothyroxine (SYNTHROID, LEVOTHROID) 137 MCG tablet Take 137 mcg by mouth daily before breakfast.     Allergies:   Levofloxacin, Tetracyclines & related, Other, Ciprofloxacin, Flonase [fluticasone], Ibuprofen, Nitrofurantoin monohyd macro, and Scopolamine   Social History   Socioeconomic History  . Marital status: DSoil scientist   Spouse name: Not on file  . Number of children: 0  . Years of education: Not on file  . Highest education level: Not on file  Occupational History  . Not on file  Tobacco Use  . Smoking status: Former Smoker    Packs/day: 1.00    Years: 9.00    Pack years: 9.00    Types: Cigarettes    Quit date: 06/16/1983    Years since quitting: 37.2  . Smokeless tobacco: Never Used  Vaping Use  . Vaping Use: Never used  Substance and Sexual Activity  . Alcohol use: Yes    Alcohol/week: 2.0 standard drinks    Types: 2 Glasses of wine per week    Comment: Twice a week  . Drug use: No  . Sexual activity: Not on file  Other Topics Concern  . Not on file  Social History Narrative  . Not on file   Social Determinants of Health    Financial Resource Strain: Not on file  Food Insecurity: Not on file  Transportation Needs: Not on file  Physical Activity: Not on file  Stress: Not on file  Social Connections: Not on file     Family History: The patient's family history includes Breast cancer in her cousin; Colitis in her paternal grandmother; Colon cancer in her cousin; Dementia in her mother; Diabetes in her brother, paternal aunt, and paternal grandfather; Emphysema in her paternal uncle; Heart Problems in her paternal aunt, paternal uncle, and paternal uncle; Heart attack in her maternal grandfather; Kidney failure in her paternal aunt; Leukemia (age of onset: 783 in her brother; Lung cancer in her paternal uncle; Multiple  myeloma in her maternal aunt; Multiple myeloma (age of onset: 43) in her maternal aunt; Other in her paternal aunt; Other (age of onset: 70) in her mother; Ovarian cancer in her maternal aunt; Parkinson's disease in her maternal aunt; Pulmonary fibrosis in her cousin, father, and paternal aunt; Stomach cancer in her maternal grandmother; Stroke in her paternal aunt and paternal grandfather; Thyroid disease in her brother. ROS:   Please see the history of present illness.     All other systems reviewed and are negative.  EKGs/Labs/Other Studies Reviewed:      EKG: 03/24/17   NSR at 68.  Normal ecg  Recent Labs: 05/14/2020: ALT 15 05/21/2020: BUN 12; Creatinine, Ser 0.82; Hemoglobin 10.3; Platelets 128; Potassium 3.8; Sodium 142  Recent Lipid Panel No results found for: CHOL, TRIG, HDL, CHOLHDL, VLDL, LDLCALC, LDLDIRECT  Physical Exam:    Physical Exam: Blood pressure 116/82, pulse 83, height 5' 4" (1.626 m), weight 214 lb (97.1 kg), SpO2 95 %.  GEN:    Moderately obese, elderly female , NAD  HEENT: Normal NECK: No JVD; No carotid bruits LYMPHATICS: No lymphadenopathy CARDIAC: RRR , soft murmur  RESPIRATORY:  Clear to auscultation without rales, wheezing or rhonchi  ABDOMEN: Soft,  non-tender, non-distended MUSCULOSKELETAL:  No edema; No deformity  SKIN: Warm and dry NEUROLOGIC:  Alert and oriented x 3   ECG :   NSR at 83.  No ST or T wave changes.   ASSESSMENT:    1. History of elevated lipids   2. Coronary artery calcification   3. Mixed hyperlipidemia    PLAN:       1. Soft systolic murmur.  Mild MR and mild TR   2.  Coronary artery calcifications:   C[place]y has had multiple CT scans.  A recent CT scan showed some coronary artery calcifications.  It also revealed enlarged lymph nodes thought to be due to sarcoidosis.  She exercises 30 minutes a day 3 times a week and has never had any episodes of chest pain or shortness of breath.  She does not have a history of hyperlipidemia with the last LDL of 156.  We discussed various options at this point including doing a coronary calcium score and also starting her on rosuvastatin.  She is not interested in starting any new medications at this point.  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  She did agree to do a coronary calcium score.  This will help with a risk assessment.   3.  Hyperlipidemia: Her last LDL was 156.  She does not want to start a statin.  We discussed that there are other meedications including PCSK9 inhibitors that would be very effective for reducing LDL cholesterol.   Medication Adjustments/Labs and Tests Ordered: Current medicines are reviewed at length with the patient today.  Concerns regarding medicines are outlined above.  Orders Placed This Encounter  Procedures  . CT CARDIAC SCORING (SELF PAY ONLY)  . EKG 12-Lead   No orders of the defined types were placed in this encounter.    Signed, Mertie Moores, MD  09/02/2020 5:37 PM    [place]

## 2020-09-02 NOTE — Patient Instructions (Signed)
Medication Instructions:  Your physician recommends that you continue on your current medications as directed. Please refer to the Current Medication list given to you today.  *If you need a refill on your cardiac medications before your next appointment, please call your pharmacy*   Lab Work: none If you have labs (blood work) drawn today and your tests are completely normal, you will receive your results only by: Marland Kitchen MyChart Message (if you have MyChart) OR . A paper copy in the mail If you have any lab test that is abnormal or we need to change your treatment, we will call you to review the results.   Testing/Procedures: Your physician has recommended that you have a coronary calcium score completed.    Follow-Up: At [facility], you and your health needs are our priority.  As part of our continuing mission to provide you with exceptional heart care, we have created designated Provider Care Teams.  These Care Teams include your primary Cardiologist (physician) and Advanced Practice Providers (APPs -  Physician Assistants and Nurse Practitioners) who all work together to provide you with the care you need, when you need it.    Your next appointment:   1 year(s)  The format for your next appointment:   In Person  Provider:   You may see Dr. Acie Fredrickson or one of the following Advanced Practice Providers on your designated Care Team:    Richardson Dopp, PA-C  [place], [place]

## 2020-09-20 DIAGNOSIS — D0511 Intraductal carcinoma in situ of right breast: Secondary | ICD-10-CM | POA: Diagnosis not present

## 2020-09-20 DIAGNOSIS — R591 Generalized enlarged lymph nodes: Secondary | ICD-10-CM | POA: Diagnosis not present

## 2020-09-25 ENCOUNTER — Ambulatory Visit: Payer: Medicare Other | Attending: Internal Medicine

## 2020-09-25 DIAGNOSIS — Z23 Encounter for immunization: Secondary | ICD-10-CM

## 2020-09-25 NOTE — Progress Notes (Signed)
   Covid-19 Vaccination Clinic  Name:  Elizabeth Ramirez    MRN: [id number] DOB: [birth year]  09/25/2020  Elizabeth Ramirez was observed post Covid-19 immunization for 15 minutes without incident. She was provided with Vaccine Information Sheet and instruction to access the V-Safe system.   Elizabeth Ramirez was instructed to call 911 with any severe reactions post vaccine: Marland Kitchen Difficulty breathing  . Swelling of face and throat  . A fast heartbeat  . A bad rash all over body  . Dizziness and weakness   Immunizations Administered    Name Date Dose VIS Date Route   PFIZER Comrnaty(Gray TOP) Covid-19 Vaccine 09/25/2020  9:39 AM 0.3 mL 05/23/2020 Intramuscular   Manufacturer: [employer], [employer]   Lot: [id number]   NDC: [phone]

## 2020-09-26 ENCOUNTER — Other Ambulatory Visit: Payer: Self-pay | Admitting: Oncology

## 2020-09-26 DIAGNOSIS — C50411 Malignant neoplasm of upper-outer quadrant of right female breast: Secondary | ICD-10-CM

## 2020-09-26 DIAGNOSIS — Z170 Estrogen receptor positive status [ER+]: Secondary | ICD-10-CM

## 2020-09-30 ENCOUNTER — Other Ambulatory Visit (HOSPITAL_COMMUNITY): Payer: Self-pay

## 2020-09-30 MED ORDER — COVID-19 MRNA VAC-TRIS(PFIZER) 30 MCG/0.3ML IM SUSP
INTRAMUSCULAR | 0 refills | Status: DC
  Filled 2020-09-30: qty 0.3, 17d supply, fill #0

## 2020-10-01 ENCOUNTER — Other Ambulatory Visit: Payer: Self-pay

## 2020-10-01 ENCOUNTER — Other Ambulatory Visit: Payer: Self-pay | Admitting: Oncology

## 2020-10-01 ENCOUNTER — Other Ambulatory Visit (HOSPITAL_COMMUNITY): Payer: Self-pay

## 2020-10-01 ENCOUNTER — Ambulatory Visit (INDEPENDENT_AMBULATORY_CARE_PROVIDER_SITE_OTHER)
Admission: RE | Admit: 2020-10-01 | Discharge: 2020-10-01 | Disposition: A | Discharge status: Home / Self Care | Payer: Self-pay | Source: Ambulatory Visit | Attending: Cardiovascular Disease | Admitting: Cardiovascular Disease

## 2020-10-01 DIAGNOSIS — I2510 Atherosclerotic heart disease of native coronary artery without angina pectoris: Secondary | ICD-10-CM

## 2020-10-02 ENCOUNTER — Other Ambulatory Visit (HOSPITAL_COMMUNITY): Payer: Self-pay

## 2020-10-16 DIAGNOSIS — I2510 Atherosclerotic heart disease of native coronary artery without angina pectoris: Secondary | ICD-10-CM

## 2020-10-22 ENCOUNTER — Telehealth: Payer: Self-pay | Admitting: Internal Medicine

## 2020-10-22 DIAGNOSIS — U071 COVID-19: Secondary | ICD-10-CM

## 2020-10-22 MED ORDER — ROSUVASTATIN CALCIUM 10 MG PO TABS: 10.0000 mg | ORAL_TABLET | Freq: Every day | ORAL | 3 refills | Status: DC

## 2020-10-22 NOTE — Telephone Encounter (Signed)
I called and spoke with pt and she is aware of order sent to get her in with the covid clinic.  Pt is aware that the will contact her to get her set up with an appt.

## 2020-10-22 NOTE — Telephone Encounter (Signed)
Attempted to call pt but unable to reach. Left message for her to return call. 

## 2020-10-22 NOTE — Telephone Encounter (Signed)
-----   Message from Thayer Headings, MD sent at 10/15/2020 10:15 PM EDT ----- Coronary calcium score of 194. This was 79 s percentile for age and sex matched control. Lets start rosuvastatin 10 mg a day  Check  lipids, alt in 3 months  The CT also show some fluid in her lungs.   Is she avoiding salt ? Lets start Lasic 20 mg a day and Kdur 10 meq a day . BMP in 3 weeks . Have report back to [place] in 3-4 weeks as to if she is breathing better.

## 2020-10-22 NOTE — Telephone Encounter (Signed)
Called and spoke to pt. Pt states she tested positive for COVID on 10/21/20. Pts s/s began on 10/19/20. Pt c/o increase in cough with chest congestion. Pt states this morning is the first time she has produced discolored mucus, light yellow. Pt states her cough is worse at night. Also c/o rhinorrhea and watery eyes. Pt denies SOB, f/c/s, CP/tightness, wheezing. Pt is requesting message be sent to Dr. Chase Caller to ask for recommendations. Pt last seen on 08/06/20 by MR.   MR, please advise. Thanks!

## 2020-10-22 NOTE — Telephone Encounter (Signed)
Refer to covid center for eval of anti viral v MaB

## 2020-10-23 ENCOUNTER — Telehealth: Payer: Self-pay

## 2020-10-23 ENCOUNTER — Telehealth: Payer: Self-pay | Admitting: Unknown Physician Specialty

## 2020-10-23 NOTE — Telephone Encounter (Signed)
I connected by phone with Elizabeth Ramirez on 10/23/2020 at 10:17 AM to discuss the potential use of a new treatment for mild to moderate COVID-19 viral infection in non-hospitalized patients.  This patient is a 75 y.o. female that meets the FDA criteria for Emergency Use Authorization of COVID monoclonal antibody bebtelovimab.  Has a (+) direct SARS-CoV-2 viral test result  Has mild or moderate COVID-19   Is NOT hospitalized due to COVID-19  Is within 10 days of symptom onset  Has at least one of the high risk factor(s) for progression to severe COVID-19 and/or hospitalization as defined in EUA.  Specific high risk criteria : Older age (>/= 76 yo)   I have spoken and communicated the following to the patient or parent/caregiver regarding COVID monoclonal antibody treatment:  1. FDA has authorized the emergency use for the treatment of mild to moderate COVID-19 in adults and pediatric patients with positive results of direct SARS-CoV-2 viral testing who are 81 years of age and older weighing at least 40 kg, and who are at high risk for progressing to severe COVID-19 and/or hospitalization.  2. The significant known and potential risks and benefits of COVID monoclonal antibody, and the extent to which such potential risks and benefits are unknown.  3. Information on available alternative treatments and the risks and benefits of those alternatives, including clinical trials.  4. Patients treated with COVID monoclonal antibody should continue to self-isolate and use infection control measures (e.g., wear mask, isolate, social distance, avoid sharing personal items, clean and disinfect "high touch" surfaces, and frequent handwashing) according to CDC guidelines.   5. The patient or parent/caregiver has the option to accept or refuse COVID monoclonal antibody treatment.  6. Discussion about the monoclonal antibody infusion does not ensure treatment. The patient will be placed on a list and  scheduled according to risk, symptom onset and availability. A scheduler will reach to the patient to let them know if we can accommodate their infusion or not.  After reviewing this information with the patient, the patient has agreed to receive one of the available covid 19 monoclonal antibodies and will be provided an appropriate fact sheet prior to infusion. Kathrine Haddock, NP 10/23/2020 10:17 AM  Sx onset 5/7.  Pt states PCP discouraged Paxlovid

## 2020-10-23 NOTE — Telephone Encounter (Signed)
Called to discuss with patient about COVID-19 symptoms and the use of one of the available treatments for those with mild to moderate Covid symptoms and at a high risk of hospitalization.  Pt appears to qualify for outpatient treatment due to co-morbid conditions and/or a member of an at-risk group in accordance with the FDA Emergency Use Authorization.    Symptom onset: 10/19/20 Cough,runny nose,sore throat Vaccinated: Yes Booster? Yes Immunocompromised?  Qualifiers: Obesity, sarcoidosis NIH Criteria: Tier 1  Pt. Would like to speak with APP.   Elizabeth Ramirez

## 2020-10-25 ENCOUNTER — Other Ambulatory Visit: Payer: Self-pay | Admitting: Physician Assistant

## 2020-10-25 ENCOUNTER — Ambulatory Visit (INDEPENDENT_AMBULATORY_CARE_PROVIDER_SITE_OTHER): Payer: Medicare Other

## 2020-10-25 DIAGNOSIS — U071 COVID-19: Secondary | ICD-10-CM | POA: Diagnosis not present

## 2020-10-25 MED ORDER — BEBTELOVIMAB 175 MG/2 ML IV (EUA)
175.0000 mg | Freq: Once | INTRAMUSCULAR | Status: AC
  Administered 2020-10-25: 175 mg via INTRAVENOUS

## 2020-10-25 MED ORDER — ALBUTEROL SULFATE HFA 108 (90 BASE) MCG/ACT IN AERS: 2.0000 | INHALATION_SPRAY | Freq: Once | RESPIRATORY_TRACT | Status: AC | PRN

## 2020-10-25 MED ORDER — EPINEPHRINE 0.3 MG/0.3ML IJ SOAJ: 0.3000 mg | Freq: Once | INTRAMUSCULAR | Status: AC | PRN

## 2020-10-25 MED ORDER — SODIUM CHLORIDE 0.9 % IV SOLN: INTRAVENOUS | Status: AC | PRN

## 2020-10-25 MED ORDER — METHYLPREDNISOLONE SODIUM SUCC 125 MG IJ SOLR: 125.0000 mg | Freq: Once | INTRAMUSCULAR | Status: AC | PRN

## 2020-10-25 MED ORDER — FAMOTIDINE IN NACL 20-0.9 MG/50ML-% IV SOLN: 20.0000 mg | Freq: Once | INTRAVENOUS | Status: AC | PRN

## 2020-10-25 MED ORDER — DIPHENHYDRAMINE HCL 50 MG/ML IJ SOLN: 50.0000 mg | Freq: Once | INTRAMUSCULAR | Status: AC | PRN

## 2020-10-25 NOTE — Patient Instructions (Signed)
10 Things You Can Do to Manage Your COVID-19 Symptoms at Home If you have possible or confirmed COVID-19: 1. Stay home except to get medical care. 2. Monitor your symptoms carefully. If your symptoms get worse, call your healthcare provider immediately. 3. Get rest and stay hydrated. 4. If you have a medical appointment, call the healthcare provider ahead of time and tell them that you have or may have COVID-19. 5. For medical emergencies, call 911 and notify the dispatch personnel that you have or may have COVID-19. 6. Cover your cough and sneezes with a tissue or use the inside of your elbow. 7. Wash your hands often with soap and water for at least 20 seconds or clean your hands with an alcohol-based hand sanitizer that contains at least 60% alcohol. 8. As much as possible, stay in a specific room and away from other people in your home. Also, you should use a separate bathroom, if available. If you need to be around other people in or outside of the home, wear a mask. 9. Avoid sharing personal items with other people in your household, like dishes, towels, and bedding. 10. Clean all surfaces that are touched often, like counters, tabletops, and doorknobs. Use household cleaning sprays or wipes according to the label instructions. cdc.gov/coronavirus 12/29/2019 This information is not intended to replace advice given to you by your health care provider. Make sure you discuss any questions you have with your health care provider. Document Revised: 04/15/2020 Document Reviewed: 04/15/2020 Elsevier Patient Education  2021 Elsevier Inc.  What types of side effects do monoclonal antibody drugs cause?  Common side effects  In general, the more common side effects caused by monoclonal antibody drugs include: . Allergic reactions, such as hives or itching . Flu-like signs and symptoms, including chills, fatigue, fever, and muscle aches and pains . Nausea, vomiting . Diarrhea . Skin  rashes . Low blood pressure   The CDC is recommending patients who receive monoclonal antibody treatments wait at least 90 days before being vaccinated.  Currently, there are no data on the safety and efficacy of mRNA COVID-19 vaccines in persons who received monoclonal antibodies or convalescent plasma as part of COVID-19 treatment. Based on the estimated half-life of such therapies as well as evidence suggesting that reinfection is uncommon in the 90 days after initial infection, vaccination should be deferred for at least 90 days, as a precautionary measure until additional information becomes available, to avoid interference of the antibody treatment with vaccine-induced immune responses.   If someone you know is interested in receiving treatment please have them contact their MD for a referral or visit [email or web address]    

## 2020-10-25 NOTE — Progress Notes (Addendum)
Patient became dizzy and diaphoretic post injection.  HR decreased to 40's - patient placed in supine position legs elevated.  BP 120's HR increased to 50s and patient became less dizzy shortly after changing to supine position.  Will monitor closely. Observed for 1 hour post infusion.  Orthostatic Vital Signs completed without abnormality.  Ambulated to parking lot without difficulty.    Diagnosis: COVID  Provider:  Marshell Garfinkel, MD  Procedure: Infusion  IV Type: Peripheral, IV Location: L Hand  Bebtelovimab, Dose: 175 mg  Infusion Start Time: 10/25/20 1445  Infusion Stop Time: 10/25/20 1446  Post Infusion IV Care: Observation period completed  Discharge: Condition: Good, Destination: Home . AVS provided to patient.   Performed by:  Jonelle Sidle, RN

## 2020-10-29 ENCOUNTER — Encounter: Payer: Self-pay | Admitting: Oncology

## 2020-11-07 ENCOUNTER — Other Ambulatory Visit: Payer: Self-pay | Admitting: Oncology

## 2020-11-07 DIAGNOSIS — C50411 Malignant neoplasm of upper-outer quadrant of right female breast: Secondary | ICD-10-CM

## 2020-11-07 DIAGNOSIS — Z170 Estrogen receptor positive status [ER+]: Secondary | ICD-10-CM

## 2020-11-19 ENCOUNTER — Ambulatory Visit: Payer: Medicare Other | Admitting: Internal Medicine

## 2020-11-20 DIAGNOSIS — J309 Allergic rhinitis, unspecified: Secondary | ICD-10-CM | POA: Diagnosis not present

## 2020-11-20 DIAGNOSIS — D0511 Intraductal carcinoma in situ of right breast: Secondary | ICD-10-CM | POA: Diagnosis not present

## 2020-11-20 DIAGNOSIS — Z01812 Encounter for preprocedural laboratory examination: Secondary | ICD-10-CM | POA: Diagnosis not present

## 2020-11-20 DIAGNOSIS — M1712 Unilateral primary osteoarthritis, left knee: Secondary | ICD-10-CM | POA: Diagnosis not present

## 2020-11-20 DIAGNOSIS — E039 Hypothyroidism, unspecified: Secondary | ICD-10-CM | POA: Diagnosis not present

## 2020-11-20 DIAGNOSIS — Z0000 Encounter for general adult medical examination without abnormal findings: Secondary | ICD-10-CM | POA: Diagnosis not present

## 2020-11-20 DIAGNOSIS — N1832 Chronic kidney disease, stage 3b: Secondary | ICD-10-CM | POA: Diagnosis not present

## 2020-11-20 DIAGNOSIS — Z1389 Encounter for screening for other disorder: Secondary | ICD-10-CM | POA: Diagnosis not present

## 2020-11-20 DIAGNOSIS — N390 Urinary tract infection, site not specified: Secondary | ICD-10-CM | POA: Diagnosis not present

## 2020-11-20 DIAGNOSIS — B009 Herpesviral infection, unspecified: Secondary | ICD-10-CM | POA: Diagnosis not present

## 2020-11-20 DIAGNOSIS — E782 Mixed hyperlipidemia: Secondary | ICD-10-CM | POA: Diagnosis not present

## 2020-11-21 ENCOUNTER — Ambulatory Visit: Payer: Medicare Other | Admitting: Internal Medicine

## 2020-11-22 DIAGNOSIS — E039 Hypothyroidism, unspecified: Secondary | ICD-10-CM | POA: Diagnosis not present

## 2020-11-22 DIAGNOSIS — N1832 Chronic kidney disease, stage 3b: Secondary | ICD-10-CM | POA: Diagnosis not present

## 2020-11-22 DIAGNOSIS — B009 Herpesviral infection, unspecified: Secondary | ICD-10-CM | POA: Diagnosis not present

## 2020-11-22 DIAGNOSIS — D0511 Intraductal carcinoma in situ of right breast: Secondary | ICD-10-CM | POA: Diagnosis not present

## 2020-11-22 DIAGNOSIS — J309 Allergic rhinitis, unspecified: Secondary | ICD-10-CM | POA: Diagnosis not present

## 2020-11-22 DIAGNOSIS — R591 Generalized enlarged lymph nodes: Secondary | ICD-10-CM | POA: Diagnosis not present

## 2020-11-22 DIAGNOSIS — M1712 Unilateral primary osteoarthritis, left knee: Secondary | ICD-10-CM | POA: Diagnosis not present

## 2020-11-22 DIAGNOSIS — E782 Mixed hyperlipidemia: Secondary | ICD-10-CM | POA: Diagnosis not present

## 2020-11-27 ENCOUNTER — Other Ambulatory Visit: Payer: Self-pay

## 2020-11-27 ENCOUNTER — Ambulatory Visit
Admission: RE | Admit: 2020-11-27 | Discharge: 2020-11-27 | Disposition: A | Discharge status: Home / Self Care | Payer: Medicare Other | Source: Ambulatory Visit | Attending: Oncology | Admitting: Oncology

## 2020-11-27 DIAGNOSIS — C50411 Malignant neoplasm of upper-outer quadrant of right female breast: Secondary | ICD-10-CM | POA: Diagnosis not present

## 2020-11-27 DIAGNOSIS — I899 Noninfective disorder of lymphatic vessels and lymph nodes, unspecified: Secondary | ICD-10-CM | POA: Diagnosis not present

## 2020-11-27 DIAGNOSIS — Z170 Estrogen receptor positive status [ER+]: Secondary | ICD-10-CM

## 2020-11-27 DIAGNOSIS — R590 Localized enlarged lymph nodes: Secondary | ICD-10-CM | POA: Diagnosis not present

## 2020-11-29 LAB — SURGICAL PATHOLOGY

## 2020-12-02 ENCOUNTER — Telehealth: Payer: Self-pay | Admitting: Oncology

## 2020-12-02 ENCOUNTER — Other Ambulatory Visit: Payer: Self-pay | Admitting: Oncology

## 2020-12-02 NOTE — Telephone Encounter (Signed)
Scheduled appt per 6/20 sch msg. Pt aware.  

## 2020-12-09 ENCOUNTER — Inpatient Hospital Stay: Payer: Medicare Other | Attending: Oncology | Admitting: Oncology

## 2020-12-09 ENCOUNTER — Other Ambulatory Visit: Payer: Self-pay

## 2020-12-09 VITALS — BP 127/71 | HR 94 | Temp 97.9°F | Resp 18 | Ht 64.0 in | Wt 198.5 lb

## 2020-12-09 DIAGNOSIS — Z87891 Personal history of nicotine dependence: Secondary | ICD-10-CM | POA: Diagnosis not present

## 2020-12-09 DIAGNOSIS — Z923 Personal history of irradiation: Secondary | ICD-10-CM | POA: Diagnosis not present

## 2020-12-09 DIAGNOSIS — D479 Neoplasm of uncertain behavior of lymphoid, hematopoietic and related tissue, unspecified: Secondary | ICD-10-CM

## 2020-12-09 DIAGNOSIS — Z801 Family history of malignant neoplasm of trachea, bronchus and lung: Secondary | ICD-10-CM | POA: Diagnosis not present

## 2020-12-09 DIAGNOSIS — Z170 Estrogen receptor positive status [ER+]: Secondary | ICD-10-CM | POA: Insufficient documentation

## 2020-12-09 DIAGNOSIS — Z807 Family history of other malignant neoplasms of lymphoid, hematopoietic and related tissues: Secondary | ICD-10-CM | POA: Insufficient documentation

## 2020-12-09 DIAGNOSIS — Z853 Personal history of malignant neoplasm of breast: Secondary | ICD-10-CM | POA: Insufficient documentation

## 2020-12-09 DIAGNOSIS — Z803 Family history of malignant neoplasm of breast: Secondary | ICD-10-CM | POA: Insufficient documentation

## 2020-12-09 DIAGNOSIS — Z1231 Encounter for screening mammogram for malignant neoplasm of breast: Secondary | ICD-10-CM

## 2020-12-09 DIAGNOSIS — Z8041 Family history of malignant neoplasm of ovary: Secondary | ICD-10-CM | POA: Diagnosis not present

## 2020-12-09 DIAGNOSIS — Z800 Family history of malignant neoplasm of digestive organs: Secondary | ICD-10-CM | POA: Insufficient documentation

## 2020-12-09 DIAGNOSIS — C50411 Malignant neoplasm of upper-outer quadrant of right female breast: Secondary | ICD-10-CM

## 2020-12-09 DIAGNOSIS — Z79899 Other long term (current) drug therapy: Secondary | ICD-10-CM | POA: Insufficient documentation

## 2020-12-09 NOTE — Progress Notes (Addendum)
[place]  [phone] [phone]     ID: Elizabeth Ramirez DOB: Sep 08, 1945  MR#: [id number]  YFV#:[id number]  Patient Care Team: Cari Caraway, MD as PCP - General (Family Medicine) Coron Rossano, Virgie Dad, MD as Consulting Physician (Oncology) Excell Seltzer, MD (Inactive) as Consulting Physician (General Surgery) Bobbye Charleston, MD as Consulting Physician (Obstetrics and Gynecology) Justice Britain, MD as Consulting Physician (Orthopedic Surgery) Rolm Bookbinder, MD as Consulting Physician (General Surgery) OTHER MD:   CHIEF COMPLAINT:  Ductal carcinoma in situ  CURRENT TREATMENT: observation   INTERVAL HISTORY: Elizabeth Ramirez returns today for follow-up of her lymphadenopathy.  She also has a history of strogen receptor positive breast cancer for which she she continues on observation.   She underwent genetic testing on 07/01/2020. Results were negative, with the exception of a variant of uncertain significance in ALK.  She also underwent high resolution chest CT on 08/02/2020 showing: interval resolution of previously-seen spiculated nodule of superior segment left lower lobe; unchanged 5 mm anterior left upper lobe nodule; nonspecific new scattered pulmonary nodules, measuring 5 mm or smaller; interval enlargement of left axillary, subpectoral, celiac axis, and retroperitoneal lymph nodes.  She proceeded to biopsy of a left axillary lymph node on 11/27/2020. Pathology [id number]) and associated flow cytometry ([id number]) showed atypical lymphoid proliferation, negative for monoclonal B-cell or phenotypically aberrant T-cell population. A lymphoproliferative process can not be excluded.  Granulomas were not noted  She has a follow-up appointment with Dr. Donne Hazel 12/25/2020 to discuss excision   REVIEW OF SYSTEMS: Elizabeth Ramirez "feels great".  She certainly has had no fevers drenching sweats or unexplained fatigue.  She is walking about 30 minutes  about 3 times a week.  She has lost 70 pounds but she has been following the [facility] program.  A detailed review of systems today was otherwise stable.   COVID 19 VACCINATION STATUS: Pfizer x4, most recently 09/2020; infection 10/2020   BREAST CANCER HISTORY: From the original intake note:  Mollie underwent Right lumpectomy and sentinel lymph node biopsy April 2005 for 4 mm, grade 1 tubular tumor (T1a No, stage IA), in the setting of DCIS. This was estrogen and progesterone receptor positive, HER-2 not amplified. Margins were ample. She participated in the MA-27 study and received anastrozole between June 2005 and June 2010, at which time she was released from follow-up.  More recently, Benisha had screening bilateral mammography showing a suspicious area in the right breast and on 12/14/2014 underwent right diagnostic mammography at the breast Center. The breast density was category A. There was a group of pleomorphic calcifications in the upper central right breast spanning up to 2.8 cm. Biopsy of this area was obtained 12/21/2014 ([id number]) and showed ductal carcinoma in situ, grade 2 or 3, estrogen receptor 95% positive, and progesterone receptor 60% positive, both with strong staining intensity.  The patient's case was presented at the multidisciplinary breast cancer conference 01/02/2015. At that time a preliminary plan was proposed, namely genetics counseling and if no mutation noted breast MRI to be followed by lumpectomy, radiation, and anti-estrogens.  Her subsequent history is as detailed below.   PAST MEDICAL HISTORY: Past Medical History:  Diagnosis Date   Allergy    tetracycline, levofloxacin, crabs & bay leaves   Arthritis    Rt knee, Rt shoulder   Breast cancer ([place]) 2016; 2005   Separate primaries; 2016 - DCIS of R breast(surgery and radiation-last 04-04-15); ER/PR+ April 2005   Cancer [facility])    Ear infection 10/19/2013  Family history of breast cancer    Family history of  multiple myeloma    Family history of ovarian cancer    Family history of pulmonary fibrosis    Family history of stomach cancer    Genetic testing 01/18/2015   Negative - no mutations found in any of 20 genes on the Breast/Ovarian Cancer Panel.  Additionally, no variants of uncertain significance (VUSs) were found.  The Breast/Ovarian gene panel offered by [facility] [name], MD) includes sequencing and deletion/duplication analysis for the following 19 genes:  ATM, BARD1, BRCA1, BRCA2, BRIP1, CDH1, CHEK2, FANCC, MLH1, MSH2, MSH6, NBN, PALB2, PMS2, PTEN, RAD51C, RAD51D, TP53, and XRCC2.  This panel also includes deletion/duplication analysis (without sequencing) for one gene, EPCAM.  Date of report is January 14, 2015.     Heart murmur    History of kidney stones    x1 '71   Hypothyroid 07/29/2011   Hypothyroidism    Malignant neoplasm of upper-outer quadrant of right breast in female, estrogen receptor positive ([place]) 01/14/2015   OA (osteoarthritis) of knee 05/27/2015   Obesity, morbid, BMI 40.0-49.9 ([place]) 12/31/2015   Other chronic pain    Personal history of radiation therapy    PONV (postoperative nausea and vomiting)    extreme nausea and vomiting - cannot tolerate scopalamine    S/P total knee arthroplasty 05/27/2015   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   Right proximal bridge of nose (CX35FU)   Seasonal allergies    Thyroid disease    Vitamin D deficiency     PAST SURGICAL HISTORY: 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   BREAST LUMPECTOMY Right 2016   BREAST LUMPECTOMY WITH NEEDLE LOCALIZATION Right 02/11/2015   Procedure: WIRE BRACKETED RIGHT BREAST LUMPECTOMY ;  Surgeon: Excell Seltzer, MD;  Location: [place];  Service: General;  Laterality: Right;   CATARACT EXTRACTION, BILATERAL  10-05-2017 left ; 11-09-2017 right   Dr Kandy Garrison    colonoscopy with biopsy   12/28/2003   neg   LYMPH NODE DISSECTION Right 02/23/2020   Procedure: right inguinal lymph node excision;  Surgeon: Alphonsa Overall, MD;  Location: [place];  Service: General;  Laterality: Right;   REVERSE SHOULDER ARTHROPLASTY Left 07/14/2018   Procedure: REVERSE SHOULDER ARTHROPLASTY;  Surgeon: Justice Britain, MD;  Location: [facility];  Service: Orthopedics;  Laterality: Left;  Interscalene Block   TONSILLECTOMY     1954   TOTAL KNEE ARTHROPLASTY Right 05/27/2015   Procedure: RIGHT TOTAL KNEE ARTHROPLASTY;  Surgeon: Gaynelle Arabian, MD;  Location: [facility];  Service: Orthopedics;  Laterality: Right;   TOTAL KNEE ARTHROPLASTY Left 05/20/2020   Procedure: TOTAL KNEE ARTHROPLASTY;  Surgeon: Gaynelle Arabian, MD;  Location: [facility];  Service: Orthopedics;  Laterality: Left;  51min   WISDOM TOOTH EXTRACTION Bilateral 1965    FAMILY HISTORY Family History  Problem Relation Age of Onset   Dementia Mother    Other Mother 60       "spot on lung"; secondhand smoke exposure   Pulmonary fibrosis Father    Diabetes Brother    Thyroid disease Brother    Leukemia Brother 37       chronic lymphocytic    Stomach cancer Maternal Grandmother        dx. 91s   Heart attack Maternal Grandfather    Diabetes Paternal Grandfather    Stroke Paternal Grandfather    Colitis Paternal  Grandmother    Parkinson's disease Maternal Aunt    Diabetes Paternal Aunt    Other Paternal Aunt        bowel obstruction   Heart Problems Paternal Uncle    Ovarian cancer Maternal Aunt        dx. 55s; paternal half sister of her mother   Multiple myeloma Maternal Aunt    Multiple myeloma Maternal Aunt 75   Kidney failure Paternal Aunt    Pulmonary fibrosis Paternal Aunt    Stroke Paternal Aunt    Heart Problems Paternal Aunt    Emphysema Paternal Uncle    Heart Problems Paternal Uncle    Lung cancer Paternal Uncle        smoker   Colon cancer Cousin    Breast cancer Cousin        dx. 75s   Pulmonary fibrosis  Remi Deter father died at the age of 62 from pulmonary fibrosis. Her mother died at age 8 with severe dementia. The patient had one brother no sisters. Her maternal grandmother died from stomach cancer the age of 81. The patient's mother had a half sister with ovarian cancer.   GYNECOLOGIC HISTORY:  No LMP recorded. Patient is postmenopausal.  menarche age 66, she isGX P0. Menopause 2003. Did not take hormone replacement   SOCIAL HISTORY:  Works as a Cabin crew, Publishing copy in renovations. She retired from [employer] as of 2018 and is now doing some Electronics engineer. At home it's her and Henriette Combs , who also is in real estate, with her own investment business.Katharine Look is a catholic, though not currently practicing    ADVANCED DIRECTIVES:  [name] is Chief Technology Officer of attorney   HEALTH MAINTENANCE: Social History   Tobacco Use   Smoking status: Former    Packs/day: 1.00    Years: 9.00    Pack years: 9.00    Types: Cigarettes    Quit date: 06/16/1983    Years since quitting: 37.5   Smokeless tobacco: Never  Vaping Use   Vaping Use: Never used  Substance Use Topics   Alcohol use: Yes    Alcohol/week: 2.0 standard drinks    Types: 2 Glasses of wine per week    Comment: Twice a week   Drug use: No     Colonoscopy:  20/15, [place] she knee  PAP: up-to-date/[facility]  Bone density: October 2016; normal  Lipid panel:  Allergies  Allergen Reactions   Levofloxacin Other (See Comments)    Affected tendons, severe pain and swelling   Tetracyclines & Related Swelling    tongue swelling and peeling   Other Diarrhea, Rash and Other (See Comments)    Crabs - diarrhea. Bay leaves - rash in her mouth   Ciprofloxacin     Tendon problems    Flonase [Fluticasone]     Nasal spray cause nose bleeds   Ibuprofen     Gets mouth ulcers if she takes for more than 3 days; Aleve okay    Nitrofurantoin Monohyd Macro Hives   Scopolamine     "Headache and redness of eyes"     Current Outpatient Medications  Medication Sig Dispense Refill   Calcium Carbonate-Vitamin D 600-400 MG-UNIT tablet 2 tablets     COVID-19 mRNA Vac-TriS, Pfizer, SUSP injection Inject into the muscle. 0.3 mL 0   levothyroxine (SYNTHROID) 125 MCG tablet Take 1 tablet (125 mcg total) by mouth daily before breakfast.     rosuvastatin (CRESTOR) 10 MG tablet Take  1 tablet (10 mg total) by mouth daily. 90 tablet 3   Current Facility-Administered Medications  Medication Dose Route Frequency Provider Last Rate Last Admin   0.9 %  sodium chloride infusion   Intravenous PRN Eileen Stanford, PA-C        OBJECTIVE: White woman in no acute distress  Vitals:   12/09/20 1537  BP: 127/71  Pulse: 94  Resp: 18  Temp: 97.9 F (36.6 C)  SpO2: 96%      Body mass index is 34.07 kg/m.    ECOG FS:1 - Symptomatic but completely ambulatory  Sclerae unicteric, EOMs intact Wearing a mask No cervical or supraclavicular adenopathy; easily palpable left axillary adenopathy Lungs no rales or rhonchi Heart regular rate and rhythm Abd soft, nontender, positive bowel sounds MSK no focal spinal tenderness, no upper extremity lymphedema Neuro: nonfocal, well oriented, appropriate affect Breasts: Deferred   RESULTS: Angiotensin-converting enzyme levels 03/01/2020 and 05/06/2020 were both normal  CMP     Component Value Date/Time   NA 142 05/21/2020 0328   K 3.8 05/21/2020 0328   CL 112 (H) 05/21/2020 0328   CO2 21 (L) 05/21/2020 0328   GLUCOSE 121 (H) 05/21/2020 0328   BUN 12 05/21/2020 0328   CREATININE 0.82 05/21/2020 0328   CREATININE 1.28 (H) 01/26/2020 1220   CALCIUM 7.3 (L) 05/21/2020 0328   PROT 6.7 05/14/2020 1101   ALBUMIN 4.1 05/14/2020 1101   AST 15 05/14/2020 1101   AST 14 (L) 01/26/2020 1220   ALT 15 05/14/2020 1101   ALT 15 01/26/2020 1220   ALKPHOS 58 05/14/2020 1101   BILITOT 1.2 05/14/2020 1101   BILITOT 1.0 01/26/2020 1220   GFRNONAA >60 05/21/2020 0328   GFRNONAA  41 (L) 01/26/2020 1220   GFRAA 51 (L) 03/01/2020 1447   GFRAA 47 (L) 01/26/2020 1220    INo results found for: SPEP, UPEP  Lab Results  Component Value Date   WBC 5.0 05/21/2020   NEUTROABS 3.3 04/01/2020   HGB 10.3 (L) 05/21/2020   HCT 31.8 (L) 05/21/2020   MCV 97.0 05/21/2020   PLT 128 (L) 05/21/2020      Chemistry      Component Value Date/Time   NA 142 05/21/2020 0328   K 3.8 05/21/2020 0328   CL 112 (H) 05/21/2020 0328   CO2 21 (L) 05/21/2020 0328   BUN 12 05/21/2020 0328   CREATININE 0.82 05/21/2020 0328   CREATININE 1.28 (H) 01/26/2020 1220      Component Value Date/Time   CALCIUM 7.3 (L) 05/21/2020 0328   ALKPHOS 58 05/14/2020 1101   AST 15 05/14/2020 1101   AST 14 (L) 01/26/2020 1220   ALT 15 05/14/2020 1101   ALT 15 01/26/2020 1220   BILITOT 1.2 05/14/2020 1101   BILITOT 1.0 01/26/2020 1220       Lab Results  Component Value Date   LABCA2 22 12/04/2008    No components found for: [id number]  No results for input(s): INR in the last 168 hours.  Urinalysis    Component Value Date/Time   COLORURINE YELLOW 05/20/2015 1120   APPEARANCEUR CLEAR 05/20/2015 1120   LABSPEC 1.009 05/20/2015 1120   PHURINE 6.5 05/20/2015 1120   GLUCOSEU NEGATIVE 05/20/2015 1120   HGBUR NEGATIVE 05/20/2015 1120   BILIRUBINUR NEGATIVE 05/20/2015 1120   BILIRUBINUR neg 12/19/2014 1427   KETONESUR NEGATIVE 05/20/2015 1120   PROTEINUR NEGATIVE 05/20/2015 1120   UROBILINOGEN 0.2 12/19/2014 1427   NITRITE NEGATIVE 05/20/2015 1120   LEUKOCYTESUR  NEGATIVE 05/20/2015 1120    STUDIES: MM CLIP PLACEMENT LEFT  Result Date: 11/27/2020 CLINICAL DATA:  Evaluate biopsy marker EXAM: DIAGNOSTIC LEFT MAMMOGRAM POST ULTRASOUND BIOPSY COMPARISON:  Previous exam(s). FINDINGS: Mammographic images were obtained following ultrasound guided biopsy of an abnormal left axillary lymph node. The biopsy marking clip is in expected position at the site of biopsy. IMPRESSION: Appropriate positioning  of the tri bell shaped biopsy marking clip at the site of biopsy in the biopsied left axillary node. Final Assessment: Post Procedure Mammograms for Marker Placement Electronically Signed   By: Dorise Bullion III M.D   On: 11/27/2020 13:35  [place] LT BREAST BX W LOC DEV 1ST LESION IMG BX SPEC US GUIDE  Addendum Date: 12/02/2020   ADDENDUM REPORT: 11/29/2020 12:01 ADDENDUM: Pathology revealed ATYPICAL LYMPHOID PROLIFERATION of the Left axilla. Flow cytometry is negative for a monoclonal B-cell or phenotypically aberrant T-cell population. Overall, the findings are atypical and a lymphoproliferative process (particularly T-cell) can not be entirely excluded. Excisional biopsy is recommended. This was found to be concordant by Dr. Dorise Bullion, with excisional biopsy recommended. Pathology results were discussed with the patient by telephone. The patient reported doing well after the biopsy with tenderness at the site. Post biopsy instructions and care were reviewed and questions were answered. The patient was encouraged to call [facility] for any additional concerns. My direct phone number was provided. Surgical consultation has been arranged with Dr. Rolm Bookbinder at [facility] on December 25, 2020. Pathology results reported by Terie Purser, RN on 11/29/2020. Electronically Signed   By: Dorise Bullion III M.D   On: 11/29/2020 12:01   Result Date: 12/02/2020 CLINICAL DATA:  Biopsy of an abnormal left axillary node seen on CT imaging. EXAM: [place] AXILLARY NODE CORE BIOPSY LEFT COMPARISON:  Previous exam(s). PROCEDURE: I met with the patient and we discussed the procedure of ultrasound-guided biopsy, including benefits and alternatives. We discussed the high likelihood of a successful procedure. We discussed the risks of the procedure, including infection, bleeding, tissue injury, clip migration, and inadequate sampling. Informed written consent was given. The usual time-out  protocol was performed immediately prior to the procedure. Using sterile technique and 1% Lidocaine as local anesthetic, under direct ultrasound visualization, a 14 gauge spring-loaded device was used to perform biopsy of an abnormal left axillary node using a lateral approach. At the conclusion of the procedure a tri bell tissue marker clip was deployed into the biopsy cavity. Follow up 2 view mammogram was performed and dictated separately. IMPRESSION: Ultrasound guided biopsy of a left axillary node. No apparent complications. Electronically Signed: By: Dorise Bullion III M.D On: 11/27/2020 13:24    ASSESSMENT: 75 y.o. [place] woman  (1) status post right lumpectomy April 2005 for a pT1a pN0, stage IA invasive breast cancer, estrogen and progesterone receptor positive, HER-2 negative, in the setting of DCIS;   (a) did not receive radiation  (b) status post anastrozole for 5 years completed June 2010  (2) status post right breast upper outer quadrant biopsy 12/21/2014 for ductal carcinoma in situ, grade 2 or 3, estrogen and progesterone receptor positive.  (3) Status post right lumpectomy 02/11/2015 showing ductal carcinoma in situ, intermediate grade, measuring 0.9 cm, with close but negative margins  (4) adjuvant radiation:   03/07/2015-04/04/2015    Right breast/ 42.5 Gy at 2.5 Gy per fraction x 17 fractions.   Right breast boost/ 7.5 Gy at 2.5 Gy per fraction x 3 fractions  (  5) genetic testing 01/14/2015 through the [place] offered by [facility] [name], MD) showed no deleterious mutations in ATM, BARD1, BRCA1, BRCA2, BRIP1, CDH1, CHEK2, FANCC, MLH1, MSH2, MSH6, NBN, PALB2, PMS2, PTEN, RAD51C, RAD51D, TP53, and XRCC2.  This panel also includes deletion/duplication analysis (without sequencing) for one gene, EPCAM.   (6) resumed anastrozole 07/06/2014, discontinued March 2017 with arthralgias and myalgias developing   (a) started letrozole  10/14/2015--discontinued after a brief course secondary to side effects  (b) Bone density 03/29/2015 was normal, with the lowest T score at 0.4  (7) additional genetics testing 07/23/2020 on the [facility] Multi-Cancer Panel + Pulmonary Fibrosis Panel found no deleterious mutations in ABCA3, ACD, AIP, ALK, APC*, ATM*, AXIN2, BAP1, BARD1, BLM, BMPR1A, BRCA1, BRCA2, BRIP1, CASR, CDC73, CDH1, CDK4, CDKN1B, CDKN1C, CDKN2A (p14ARF), CDKN2A (p16INK4a), CEBPA, CHEK2, CTC1, CTNNA1, DICER1*, DIS3L2*, DKC1, EGFR, EPCAM*, FH*, FLCN, GATA2, GPC3*, GREM1*, HOXB13, HRAS, KIT, MAX*, MEN1*, MET*, MITF, MLH1*, MSH2*, MSH3*, MSH6*, MUTYH, NAF1, NBN, NF1*, NF2, NHP2, NOP10, NTHL1, PALB2, PARN, PDGFRA, PHOX2B*, PMS2*, POLD1*, POLE, POT1, PRKAR1A, PTCH1, PTEN*, RAD50, RAD51C, RAD51D, RB1*, RECQL4*, RET, RTEL1, RUNX1, SDHA*, SDHAF2, SDHB, SDHC*, SDHD, SFTPC, SMAD4, SMARCA4, SMARCB1, SMARCE1, STK11, SUFU, TERC, TERT, TINF2, TMEM127, TP53, TSC1*, TSC2, USB1, VHL, WRAP53, WRN*, WT1.  (a) VUS in ALK identified called [id number].   (8) possible lymphoproliferative disease:   (A) continue CT obtained to follow nonspecific pulmonary nodules 01/26/2020 shows left axillary and left celiac adenopathy.  (B) PET scan 02/08/2020 shows generalized lymphadenopathy, which is hypermetabolic, together with splenic enlargement  (C) right inguinal lymph node biopsy 34/74/2595 showed follicular hyperplasia and epithelioid granulomas, no morphologic or immunophenotypic evidence of lymphoma.  This was read as most consistent with a reactive process without excluding sarcoidosis  (D) chest CT scan 08/02/2020 again shows left axillary subpectoral celiac and retroperitoneal adenopathy as well as multiple very small scattered pulmonary nodules  (E) left axillary lymph node biopsy 11/27/2020 shows an atypical lymphoid proliferation with no B-cell monoclonal component.   PLAN: Francina continues to be entirely asymptomatic as far as her diffuse  lymphadenopathy is concerned.  It will soon be a year out from the initial finding (a CT of the chest of 2020 did not show adenopathy) and we are still do not have a diagnosis.  I think we have 3 possibilities.  One is sarcoidosis, although the caseating granulomas are atypical for that; second possibility would be a T-cell lymphoproliferative neoplasm.  I will have to coordinate with pathology to obtain the definitive studies regarding that (there is clearly no B-cell monoclonality).  A third possibility is a reactive process not otherwise specified.  She has already an appointment for follow-up with Dr. Donne Hazel and likely will proceed to f remove a full lymph node for optimal diagnosis.  She is also being [facility] by [facility].  She will see me again late August of this year.  Of course if we do obtain a definitive diagnosis before then I can see her earlier.  T-cell studies however may take more than 2 weeks for definitive results  She knows to call for any other issue that may develop before then.  Total encounter time 35 minutes.*   Ylianna Almanzar, Virgie Dad, MD  12/09/20 4:17 PM Medical Oncology and Hematology [facility] [place], [place] [address] Tel. [phone]    Fax. [phone]    I, Wilburn Mylar, am acting as scribe for Dr. Virgie Dad. Baptiste Littler.  Lindie Spruce MD, have reviewed the  above documentation for accuracy and completeness, and I agree with the above.   *Total Encounter Time as defined by the Centers for Medicare and Medicaid Services includes, in addition to the face-to-face time of a patient visit (documented in the note above) non-face-to-face time: obtaining and reviewing outside history, ordering and reviewing medications, tests or procedures, care coordination (communications with other health care professionals or caregivers) and documentation in the medical record.

## 2020-12-19 ENCOUNTER — Ambulatory Visit: Payer: Medicare Other | Admitting: Internal Medicine

## 2020-12-25 ENCOUNTER — Other Ambulatory Visit: Payer: Self-pay | Admitting: General Surgery

## 2020-12-25 DIAGNOSIS — R590 Localized enlarged lymph nodes: Secondary | ICD-10-CM

## 2020-12-26 DIAGNOSIS — E782 Mixed hyperlipidemia: Secondary | ICD-10-CM

## 2020-12-26 NOTE — Telephone Encounter (Signed)
Left message for patient to call back  

## 2021-01-09 ENCOUNTER — Other Ambulatory Visit: Payer: Self-pay | Admitting: General Surgery

## 2021-01-09 DIAGNOSIS — R590 Localized enlarged lymph nodes: Secondary | ICD-10-CM

## 2021-01-22 DIAGNOSIS — M1712 Unilateral primary osteoarthritis, left knee: Secondary | ICD-10-CM | POA: Diagnosis not present

## 2021-01-22 DIAGNOSIS — E039 Hypothyroidism, unspecified: Secondary | ICD-10-CM | POA: Diagnosis not present

## 2021-01-22 DIAGNOSIS — J309 Allergic rhinitis, unspecified: Secondary | ICD-10-CM | POA: Diagnosis not present

## 2021-01-22 DIAGNOSIS — R591 Generalized enlarged lymph nodes: Secondary | ICD-10-CM | POA: Diagnosis not present

## 2021-01-22 DIAGNOSIS — D0511 Intraductal carcinoma in situ of right breast: Secondary | ICD-10-CM | POA: Diagnosis not present

## 2021-01-22 DIAGNOSIS — B009 Herpesviral infection, unspecified: Secondary | ICD-10-CM | POA: Diagnosis not present

## 2021-01-22 DIAGNOSIS — N1832 Chronic kidney disease, stage 3b: Secondary | ICD-10-CM | POA: Diagnosis not present

## 2021-01-22 DIAGNOSIS — E782 Mixed hyperlipidemia: Secondary | ICD-10-CM | POA: Diagnosis not present

## 2021-01-23 ENCOUNTER — Ambulatory Visit
Admission: RE | Admit: 2021-01-23 | Discharge: 2021-01-23 | Disposition: A | Discharge status: Home / Self Care | Payer: Medicare Other | Source: Ambulatory Visit | Attending: Oncology | Admitting: Oncology

## 2021-01-23 ENCOUNTER — Ambulatory Visit (INDEPENDENT_AMBULATORY_CARE_PROVIDER_SITE_OTHER): Payer: Medicare Other | Admitting: Pharmacist

## 2021-01-23 ENCOUNTER — Ambulatory Visit (INDEPENDENT_AMBULATORY_CARE_PROVIDER_SITE_OTHER)
Admission: RE | Admit: 2021-01-23 | Discharge: 2021-01-23 | Disposition: A | Discharge status: Home / Self Care | Payer: Self-pay | Source: Ambulatory Visit | Attending: Cardiovascular Disease | Admitting: Cardiovascular Disease

## 2021-01-23 ENCOUNTER — Other Ambulatory Visit: Payer: Medicare Other

## 2021-01-23 ENCOUNTER — Other Ambulatory Visit: Payer: Self-pay

## 2021-01-23 DIAGNOSIS — I2584 Coronary atherosclerosis due to calcified coronary lesion: Secondary | ICD-10-CM | POA: Diagnosis not present

## 2021-01-23 DIAGNOSIS — C50411 Malignant neoplasm of upper-outer quadrant of right female breast: Secondary | ICD-10-CM

## 2021-01-23 DIAGNOSIS — Z1231 Encounter for screening mammogram for malignant neoplasm of breast: Secondary | ICD-10-CM | POA: Diagnosis not present

## 2021-01-23 DIAGNOSIS — D479 Neoplasm of uncertain behavior of lymphoid, hematopoietic and related tissue, unspecified: Secondary | ICD-10-CM

## 2021-01-23 DIAGNOSIS — I2510 Atherosclerotic heart disease of native coronary artery without angina pectoris: Secondary | ICD-10-CM | POA: Diagnosis not present

## 2021-01-23 DIAGNOSIS — E782 Mixed hyperlipidemia: Secondary | ICD-10-CM

## 2021-01-23 DIAGNOSIS — Z170 Estrogen receptor positive status [ER+]: Secondary | ICD-10-CM

## 2021-01-23 MED ORDER — PRAVASTATIN SODIUM 20 MG PO TABS: 20.0000 mg | ORAL_TABLET | Freq: Every evening | ORAL | 11 refills | Status: DC

## 2021-01-23 NOTE — Patient Instructions (Signed)
It was nice to meet you today  Your LDL cholesterol is 153 and your goal is < 70 due to your elevated coronary calcium score  Start taking pravastatin '20mg'$  - you can start with 1 tablet every other day. If you're tolerating this after 2 weeks, increase to once daily dosing  Call Jinny Blossom, PharmD with any tolerability issues [phone], otherwise I'll plan to give you a call in a month to see how you're doing  Other cholesterol medication options: -Ezetimibe (Zetia) - once daily pill, lowers LDL by 20% -PCSK9 inhibitor (Repatha or Praluent) - twice monthly injection, lowers LDL by 60%

## 2021-01-23 NOTE — Progress Notes (Signed)
Patient ID: Elizabeth Ramirez                 DOB: Dec 16, 1945                    MRN: [id number]     HPI: Elizabeth Ramirez is a 75 y.o. female patient referred to lipid clinic by Dr Acie Fredrickson. PMH is significant for hypothyroidism, breast cancer, obesity, and heart murmur. Pt underwent coronary calcium scoring 10/01/20 which came back at 86 (71st percentile for age and sex matched control), notable for aortic atherosclerosis and calcium throughout the LAD. She was started on rosuvastatin 10mg  daily. 2 months later, pt sent in message reporting muscle pain and weakness on rosuvastatin therapy. She was referred to PharmD for lipid management. Repeat calcium score being checked today as well.  Pt presents today in good spirits. Initially prescribed rosuvastatin after elevated calcium score noted in April. Pt did not start rosuvastatin until after PCP checked her lipids in June and her LDL was elevated. Reports noticing joint and muscle pain after 2 weeks of therapy - felt aching like before she had 2 knee and a shoulder replacement. Continued for a month before stopping and her sx subsequently resolved. Has not taken other meds in the past for her cholesterol. Has lost 70 lbs in the past few years - 20 from [employer] and an additional 50 from [employer]; still following this diet. Has not been as active since COVID but used to do water aerobics pre-COVID.  Current Medications: none Intolerances: rosuvastatin 10mg  daily - muscle pain and weakness Risk Factors: aortic atherosclerosis with elevated calcium score LDL goal: 70mg /dL  Diet: Lost 20 lbs with [employer], then an additional 50 lbs with [employer], still on this diet plan  Exercise: was doing water aerobics pre-COVID, not as much now  Family History: Breast cancer in her cousin; Colitis in her paternal grandmother; Colon cancer in her cousin; Dementia in her mother; Diabetes in her brother, paternal aunt, and paternal grandfather; Emphysema in her  paternal uncle; Heart Problems in her paternal aunt, paternal uncle, and paternal uncle; Heart attack in her maternal grandfather; Kidney failure in her paternal aunt; Leukemia (age of onset: 76) in her brother; Lung cancer in her paternal uncle; Multiple myeloma in her maternal aunt; Multiple myeloma (age of onset: 13) in her maternal aunt; Other in her paternal aunt; Other (age of onset: 19) in her mother; Ovarian cancer in her maternal aunt; Parkinson's disease in her maternal aunt; Pulmonary fibrosis in her cousin, father, and paternal aunt; Stomach cancer in her maternal grandmother; Stroke in her paternal aunt and paternal grandfather; Thyroid disease in her brother.  Social History: Former smoker 1 PPD for 9 years, quit in 1985, denies drug use, 2 glasses of wine per week.  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  Additionally, no variants of uncertain significance (VUSs) were found.  The Breast/Ovarian gene panel offered by [facility] [name], MD) includes sequencing and deletion/duplication analysis for the following 19 genes:  ATM, BARD1, BRCA1, BRCA2, BRIP1, CDH1, CHEK2, FANCC, MLH1, MSH2, MSH6, NBN, PALB2, PMS2, PTEN, RAD51C, RAD51D, TP53, and XRCC2.  This panel also includes deletion/duplication analysis (without sequencing) for one gene, EPCAM.  Date of  report is January 14, 2015.     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 0.3 mL 0   levothyroxine (SYNTHROID) 125 MCG tablet Take 1 tablet (125 mcg total) by mouth daily before breakfast.     rosuvastatin (CRESTOR) 10 MG tablet Take 1 tablet (10 mg total) by mouth daily. 90 tablet 3   Current Facility-Administered Medications on File Prior to Visit  Medication Dose Route Frequency Provider Last Rate Last Admin   0.9 %  sodium chloride infusion   Intravenous PRN Eileen Stanford, PA-C        Allergies  Allergen Reactions   Levofloxacin Other (See Comments)    Affected tendons, severe pain and swelling   Tetracyclines & Related Swelling    tongue swelling and peeling   Other Diarrhea, Rash and Other (See Comments)    Crabs - diarrhea. Bay leaves - rash in her mouth   Ciprofloxacin     Tendon problems    Flonase [Fluticasone]     Nasal spray cause nose bleeds   Ibuprofen     Gets mouth ulcers if she takes for more than 3 days; Aleve okay    Nitrofurantoin Monohyd Macro Hives   Scopolamine     "Headache and redness of eyes"     Assessment/Plan:  1. Hyperlipidemia - Baseline LDL 153 above goal < 70 due to elevated calcium score. Did not tolerate rosuvastatin 80m daily secondary to myalgias. Will rechallenge with low dose pravastatin 222mdaily - pt prefers to take every other day for the first 2 weeks, then advised her to increase to daily dosing if tolerating well. Will call pt in 1 month to assess tolerability. Goal to titrate to max tolerated statin dose. Discussed addition of ezetimibe or PCSK9i therapy if needed, however insurance will not cover PCSK9i until pt has tried at least 2 statins. Will plan to check lipids 6-12 weeks after she's tolerating max lipid lowering therapy.  Elizabeth Ramirez E. Arsen Mangione, PharmD, BCACP, CPLa [id number]. [id number] StreetGrBentleyNC [id number]: [phone]  Fax: [phone] 01/23/2021 10:38 AM

## 2021-01-31 ENCOUNTER — Other Ambulatory Visit: Payer: Self-pay

## 2021-01-31 ENCOUNTER — Encounter: Payer: Self-pay | Admitting: Internal Medicine

## 2021-01-31 ENCOUNTER — Ambulatory Visit
Admission: RE | Admit: 2021-01-31 | Discharge: 2021-01-31 | Disposition: A | Discharge status: Home / Self Care | Payer: Medicare Other | Source: Ambulatory Visit | Attending: Internal Medicine | Admitting: Internal Medicine

## 2021-01-31 ENCOUNTER — Ambulatory Visit (INDEPENDENT_AMBULATORY_CARE_PROVIDER_SITE_OTHER): Payer: Medicare Other | Admitting: Internal Medicine

## 2021-01-31 VITALS — BP 122/78 | HR 65 | Temp 97.4°F | Ht 64.5 in | Wt 194.6 lb

## 2021-01-31 DIAGNOSIS — R911 Solitary pulmonary nodule: Secondary | ICD-10-CM

## 2021-01-31 DIAGNOSIS — I2510 Atherosclerotic heart disease of native coronary artery without angina pectoris: Secondary | ICD-10-CM | POA: Diagnosis not present

## 2021-01-31 DIAGNOSIS — R591 Generalized enlarged lymph nodes: Secondary | ICD-10-CM

## 2021-01-31 DIAGNOSIS — R590 Localized enlarged lymph nodes: Secondary | ICD-10-CM

## 2021-01-31 DIAGNOSIS — R918 Other nonspecific abnormal finding of lung field: Secondary | ICD-10-CM | POA: Diagnosis not present

## 2021-01-31 DIAGNOSIS — Z836 Family history of other diseases of the respiratory system: Secondary | ICD-10-CM | POA: Diagnosis not present

## 2021-01-31 DIAGNOSIS — Z853 Personal history of malignant neoplasm of breast: Secondary | ICD-10-CM

## 2021-01-31 DIAGNOSIS — I700 Atherosclerosis of aorta: Secondary | ICD-10-CM | POA: Diagnosis not present

## 2021-01-31 NOTE — Patient Instructions (Addendum)
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 In addition some of the abdominal lymph nodes are larger. This could all be granuloma as evidenced by your right inguinal biopsy in September 2021. However in the setting of breast cancer -needle biopsy in June 2022 showed some atypical cells.  Noticed that now he is scheduled for excision biopsy on 02/18/2021  Plan - Go through with excision biopsy 02/18/2021 -At this point in time we will hold off on any steroids for sarcoidosis consideration -Decisions on approaching this based on biopsy of the left axillary node after 02/18/2021  Pulmonary nodules - multiple   -CT scan of the chest February 2022 shows resolution of the left lower lobe lymph node, persistence of the left upper lobe lymph node and multiple very tiny other nodules that are new compared to August 2021  -CT scan chest results from 01/31/2021 are pending  Plan - We will call you with the results -if nodules are resolving we will discharge home follow-up   Family history of pulmonary fibrosis  CT scan of the chest February 2022 shows no evidence of pulmonary fibrosis -Genetic testing for pulmonary fibrosis is noncontributory in January 2022 -Glad you are feeling well without any shortness of breath  Plan -Expectant follow-up  Coronary artery calcification seen on CT scan February 2022  -Glad you not have chest pain but I do not see evidence of cardiac stress test. -Last visit I referred you to cardiology.  Did you see cardiology?  Plan -Given risk factors refer to cardiology but if you want to hold off then please take this up with your primary care physician   Follow-up -Return -as needed but can decide based on CT scan results from 01/31/2021 today

## 2021-01-31 NOTE — Progress Notes (Signed)
OV 05/06/2020  Subjective:  Patient ID: Elizabeth Ramirez, female , DOB: Nov 08, 1945 , age 75 y.o. , MRN: [id number] , ADDRESS: [place] [id number] PCP Cari Caraway, MD Patient Care Team: Cari Caraway, MD as PCP - General (Family Medicine) Magrinat, Virgie Dad, MD as Consulting Physician (Oncology) Excell Seltzer, MD (Inactive) as Consulting Physician (General Surgery) Bobbye Charleston, MD as Consulting Physician (Obstetrics and Gynecology) Justice Britain, MD as Consulting Physician (Orthopedic Surgery)  This Provider for this visit: Treatment Team:  Attending Provider: Brand Males, MD    05/06/2020 -   Chief Complaint  Patient presents with   Consult    Pt is being referred by Dr. [name] for possible sarcoidosis. Pt denies any complaints of cough, SOB, or CP.     HPI Elizabeth Ramirez 75 y.o. -  HPI 05/06/2020   -New consult evaluation.  Referred by Dr. [name]  75 year old female who is dealing with left knee issue and is awaiting a left knee replacement May 20, 2020 with Dr. [name].  She is under surveillance for breast cancer and right when she is going to be discharged from long-term follow-up lymphadenopathy was described and discovered.  Started off with a lung nodule discovery in the left lower lobe 11 mm along with axillary node discovery on a CT chest.  This resulted in PET scan that is described below.  There is diffuse uptake particularly in the abdomen and the liver and the splenic areas.  She also had a inguinal node.  This was then biopsied in September 2021 and showed granuloma.  Sarcoidosis under consideration but she has granulomatous adenitis.  She basically has no respiratory symptoms.  She been referred here for evaluation of this.  There is no hemoptysis or shortness of breath or wheezing or cough.  There is no weight loss or fever or chills or any B symptoms.  Currently she is most concerned about getting and going through  her left knee replacement.  Rest of the history is documented as a copy and paste from Dr. [name]  She is concerned about these findings.  Particularly she reports a strong family so pulmonary fibrosis in her dad and a paternal aunt and a paternal cousin who is the niece of the paternal aunt and her dad.   - IMPRESSION: CT chest January 26, 2020 11 x 7 mm irregular nodule in the superior segment left lower lobe, new. This is indeterminate but raises concern for primary bronchogenic neoplasm or metastasis. This is just at the size threshold for PET sensitivity. Consider PET-CT for further evaluation.   Mildly progressive left axillary/subpectoral nodes measuring up to 11 mm short axis. Contralateral nodal metastases would be unusual. Reactive nodes or low-grade lymphoproliferative disorder are also possible. This can be evaluated at the time of PET-CT.   10 mm short axis left celiac axis node, also minimally progressive, warranting attention at the time of PET-CT.   Aortic Atherosclerosis (ICD10-I70.0) and Emphysema (ICD10-J43.9).     Electronically Signed   By: [name] M.D.   On: 01/26/2020 16:22   PET scan February 08, 2020  IMPRESSION: 1. Generalized nodal enlargement and or hypermetabolic lymph nodes with splenic enlargement and increased metabolic activity findings are most suggestive of lymphoproliferative disorder/lymphoma. RIGHT groin lymph node with considerable FDG uptake may be the best target for sampling. Would avoid LEFT axillary lymph nodes given that the patient reportedly has had recent COVID-19 vaccination which can be confounding on PET scan. Correlate with  side of vaccine administration. Metastatic disease is also considered though would be unusual in the setting of breast cancer with pelvic node predominant disease and splenic enlargement with mild hypermetabolism. 2. Small lymph nodes in the neck and potential RIGHT parotid lesion, this could be  related but would suggest dedicated assessment of the parotid gland for further evaluation, this could be performed with CT or ultrasound as an initial means in further evaluating this area. 3. Small lung nodule in the LEFT lower lobe is nonspecific in terms of FDG uptake suggesting indolent process. Morphologic features raising the question of bronchogenic neoplasm as outlined previously. 4. Mild increased FDG uptake in the RIGHT breast may relate to recent biopsy at the site of previous surgery, refer to biopsy results and mammography for further information. 5. Lobular hepatic contours could also be seen in the setting of liver disease, correlate with any clinical or laboratory evidence of liver disease. Liver disease could be an alternative explanation for splenic enlargement. 6. Activation of brown fat along the spine and potentially in the area of the occipital region and posterior neck.     Electronically Signed   By: [name] M.D.   On: 02/08/2020 16:59   A. LYMPH NODE, RIGHT INGUINAL, BIOPSY: February 23, 2020 -  Lymph node with follicular hyperplasia, scattered giant cells and  epithelioid granulomas  -  No morphologic or immunophenotypic evidence of lymphoma  -  See comment   Famil hx Sanders father died at the age of 50 from pulmonary fibrosis.  A paternal aunt also had pulmonary fibrosis.  A paternal cousin also has pulmonary fibrosis.  Her mother died at age 57 with severe dementia. The patient had one brother no sisters. Her maternal grandmother died from stomach cancer the age of 24. The patient's mother had a half sister with ovarian cancer.    SOCIAL HISTORY:  Works as a Cabin crew, Publishing copy in renovations. She retired from [employer] as of 2018 and is now doing some Electronics engineer. At home it's her and [name] , who also is in real estate, with her own investment business..    GENETIC clinic 07/24/20  GENETIC TEST RESULTS: Genetic testing reported  out on 07/23/2020 through the [facility] Multi-Cancer Panel + Pulmonary Fibrosis panel found no pathogenic mutations.    The Multi-Cancer Panel + Pulmonary Fibrosis panel offered by [facility] includes sequencing and/or deletion duplication testing of the following 97 genes: ABCA3, ACD, AIP, ALK, APC*, ATM*, AXIN2, BAP1, BARD1, BLM, BMPR1A, BRCA1, BRCA2, BRIP1, CASR, CDC73, CDH1, CDK4, CDKN1B, CDKN1C, CDKN2A (p14ARF), CDKN2A (p16INK4a), CEBPA, CHEK2, CTC1, CTNNA1, DICER1*, DIS3L2*, DKC1, EGFR, EPCAM*, FH*, FLCN, GATA2, GPC3*, GREM1*, HOXB13, HRAS, KIT, MAX*, MEN1*, MET*, MITF, MLH1*, MSH2*, MSH3*, MSH6*, MUTYH, NAF1, NBN, NF1*, NF2, NHP2, NOP10, NTHL1, PALB2, PARN, PDGFRA, PHOX2B*, PMS2*, POLD1*, POLE, POT1, PRKAR1A, PTCH1, PTEN*, RAD50, RAD51C, RAD51D, RB1*, RECQL4*, RET, RTEL1, RUNX1, SDHA*, SDHAF2, SDHB, SDHC*, SDHD, SFTPC, SMAD4, SMARCA4, SMARCB1, SMARCE1, STK11, SUFU, TERC, TERT, TINF2, TMEM127, TP53, TSC1*, TSC2, USB1, VHL, WRAP53, WRN*, WT1    OV 08/06/2020  Subjective:  Patient ID: Elizabeth Ramirez, female , DOB: 26-Sep-1945 , age 33 y.o. , MRN: [id number] , ADDRESS: [address] [place] [id number] PCP Cari Caraway, MD Patient Care Team: Cari Caraway, MD as PCP - General (Family Medicine) Magrinat, Virgie Dad, MD as Consulting Physician (Oncology) Excell Seltzer, MD (Inactive) as Consulting Physician (General Surgery) Bobbye Charleston, MD as Consulting Physician (Obstetrics and Gynecology) Justice Britain, MD as Consulting Physician (Orthopedic Surgery)  This Provider for this visit: Treatment Team:  Attending Provider: Brand Males, MD    08/06/2020 -   Chief Complaint  Patient presents with   Follow-up    Doing well, no issues   Multiple pulmonary nodules in the setting of breast cancer and also family history of pulmonary fibrosis and also right inguinal lymphadenopathy September 2021 there is granuloma  HPI Elizabeth Ramirez 75 y.o. -returns for follow-up. In the  interim we did testing for granuloma that includes QuantiFERON gold angiotensin-converting enzyme. These are normal. Immunoglobulins are normal. In the interim she has had a left knee surgery. Her mobility has improved. Her shortness of breath is not there. She feels happier. She had CT scan of the chest in February 2022 that I personally visualized with her. This is for multiple pulmonary nodules. The concerning left lower lobe nodule is resolved. The small left upper lobe nodule persists. There is also multiple 5 mm nodules. These are all n in comparison to August 2021. She has a history of breast cancer. Her surgeon Dr. Lucia Gaskins is retired. She wants to see Dr. Donne Hazel. She otherwise feels good. She had genetic testing for pulmonary fibrosis and this was reviewed with a genetics counselor by email discussion. In addition I reviewed the note and this was noncontributory. It is documented above. There is no coughing or wheezing. She is somewhat reluctant to do daily steroids.  Other findings on the CT scan of the chest include coronary artery calcification which I am addressing for the first time.  She has air trapping but she is asymptomatic.  She has pulmonary artery enlargement but she is asymptomatic. CT Chest data  CT Chest High Resolution  Result Date: 08/02/2020 CLINICAL DATA:  Follow-up lung nodule, history of right breast cancer EXAM: CT CHEST WITHOUT CONTRAST TECHNIQUE: Multidetector CT imaging of the chest was performed following the standard protocol without intravenous contrast. High resolution imaging of the lungs, as well as inspiratory and expiratory imaging, was performed. COMPARISON:  CT chest, 01/26/2020, PET-CT, 02/08/2020 FINDINGS: Cardiovascular: Left coronary artery calcifications. Normal heart size. No pericardial effusion. Enlargement of the main pulmonary artery measuring up to 3.6 cm. Mediastinum/Nodes: Left axillary and subpectoral lymph nodes are enlarged compared to prior  examination, an index axillary node measuring 2.1 x 1.6 cm, previously 1.7 x 1.1 cm when measured similarly (series 2, image 21). Thyroid gland, trachea, and esophagus demonstrate no significant findings. Lungs/Pleura: Interval resolution of previously seen spiculated nodule of the superior segment left lower lobe (series 4, image 124). Unchanged spiculated nodule in the anterior left upper lobe measuring 5 mm (series 4, image 115). There are multiple new scattered small pulmonary nodules, for example a 5 mm nodule of the peripheral left upper lobe (series 4, image 150) and a 3 mm pulmonary nodule of the left lung base (series 4, image 239). Minimal subpleural radiation fibrosis of the anterior right lung. Mild, lobular air trapping on expiratory phase imaging. No pleural effusion or pneumothorax. Upper Abdomen: No acute abnormality. Multiple celiac axis and retroperitoneal lymph nodes in the partially included upper abdomen are increased in size compared to prior examination, an index left retroperitoneal node measuring 1.2 x 1.0 cm, previously 0.6 x 0.6 cm (series 2, image 166). Musculoskeletal: No chest wall mass or suspicious bone lesions identified. Postoperative findings of prior right lumpectomy. IMPRESSION: 1. Interval resolution of previously seen spiculated nodule of the superior segment left lower lobe, consistent with resolved nonspecific infection or inflammation. 2. Unchanged spiculated nodule in  the anterior left upper lobe measuring 5 mm, likely benign given established stability. 3. There are multiple new scattered small pulmonary nodules, measuring 5 mm or smaller. These are nonspecific and statistically most likely infectious or inflammatory, although given history of malignancy, metastatic disease is not excluded. 4. Interval enlargement of left axillary, subpectoral, celiac axis, and retroperitoneal lymph nodes, which remain generally concerning for lymphoma or metastatic disease. 5. Mild,  lobular air trapping on expiratory phase imaging, consistent with small airways disease. 6. Enlargement of the main pulmonary artery, as can be seen in pulmonary hypertension. 7. Coronary artery disease. Electronically Signed   By: Eddie Candle M.D.   On: 08/02/2020 15:25      PFT  OV 01/31/2021  Subjective:  Patient ID: Elizabeth Ramirez, female , DOB: 04/20/1946 , age 57 y.o. , MRN: [id number] , ADDRESS: [address] [place] [id number] PCP Cari Caraway, MD Patient Care Team: Cari Caraway, MD as PCP - General (Family Medicine) Magrinat, Virgie Dad, MD as Consulting Physician (Oncology) Excell Seltzer, MD (Inactive) as Consulting Physician (General Surgery) Bobbye Charleston, MD as Consulting Physician (Obstetrics and Gynecology) Justice Britain, MD as Consulting Physician (Orthopedic Surgery) Rolm Bookbinder, MD as Consulting Physician (General Surgery)  This Provider for this visit: Treatment Team:  Attending Provider: Brand Males, MD    01/31/2021 -   Chief Complaint  Patient presents with   Follow-up    Doing well, no issues     HPI Elizabeth Ramirez 75 y.o. -p presents for follow-up.  And CT scan of the chest in February 2022 showed resolution of the left lower lobe lymph node from 2021 August but she had numerous other multiple tiny nodules including left upper lobe lymph nodes.  We ordered a CT scan of the chest today but the results are pending.  Upon my personal visualization it looks okay.    Of note she did have an enlarged left axillary lymph node and I sent her to surgeon .  I reviewed the record.  And once end of June 2022 she had a needle biopsy that showed abnormal T cells.  She is now scheduled for excision biopsy on 02/18/2021.  In the interim she will see Dr. [name] her oncologist.  Also referred her to cardiology for coronary artery calcification but I do not know if she is seeing cardiology.  She is denying any chest pain or shortness of  breath and overall she is reporting good health.  No results found.    Background history from the oncologist -in September 2021   (1) status post right lumpectomy April 2005 for a pT1a pN0, stage IA invasive breast cancer, estrogen and progesterone receptor positive, HER-2 negative, in the setting of DCIS;              (a) did not receive radiation             (b) status post anastrozole for 5 years completed June 2010   (2) status post right breast upper outer quadrant biopsy 12/21/2014 for ductal carcinoma in situ, grade 2 or 3, estrogen and progesterone receptor positive.   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   Right breast boost/ 7.5 Gy at 2.5 Gy per fraction x 3 fractions   (5) genetic  testing 01/14/2015 through the [place] offered by [facility] [name], MD) showed no deleterious mutations in ATM, BARD1, BRCA1, BRCA2, BRIP1, CDH1, CHEK2, FANCC, MLH1, MSH2, MSH6, NBN, PALB2, PMS2, PTEN, RAD51C, RAD51D, TP53, and XRCC2.  This panel also includes deletion/duplication analysis (without sequencing) for one gene, EPCAM.    (6) resumed anastrozole 07/06/2014, discontinued March 2017 with arthralgias and myalgias developing              (a) started letrozole 10/14/2015--discontinued after a brief course secondary to side effects             (b) Bone density 03/29/2015 was normal, with the lowest T score at 0.4    Elizabeth Ramirez is now just over 5 years out from definitive surgery for her breast cancer with no evidence of disease recurrence.  This is favorable.   We were preparing to discharge her from follow-up when she was found to have diffuse lymphadenopathy.  We obtained a lymph node biopsy which shows no evidence of breast  cancer or lymphoma.  There are granulomas but no evidence of tuberculosis or other chronic infection.  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  8) possible lymphoproliferative disease:               (A) continue CT obtained to follow nonspecific pulmonary nodules 01/26/2020 shows left axillary and left celiac adenopathy.             (B) PET scan 02/08/2020 shows generalized lymphadenopathy, which is hypermetabolic, together with splenic enlargement             (C) right inguinal lymph node biopsy 95/32/0233 showed follicular hyperplasia and epithelioid granulomas, no morphologic or immunophenotypic evidence of lymphoma.  This was read as most consistent with a reactive process without excluding sarcoidosis  (D) chest CT scan 08/02/2020 again shows left axillary subpectoral celiac and retroperitoneal adenopathy as well as multiple very small scattered pulmonary nodules             (E) left axillary lymph node biopsy 11/27/2020 shows an atypical lymphoid proliferation with no B-cell monoclonal component.   has a past medical history of Allergy, Arthritis, Breast cancer ([place]) (2016; 2005), Cancer [facility]), Ear infection (10/19/2013), Family history of breast cancer, Family history of multiple myeloma, Family history of ovarian cancer, Family history of pulmonary fibrosis, Family history of stomach cancer, Genetic testing (01/18/2015), Heart murmur, History of kidney stones, Hypothyroid (07/29/2011), Hypothyroidism, Malignant neoplasm of upper-outer quadrant of right breast in female, estrogen receptor positive ([place]) (01/14/2015), OA (osteoarthritis) of knee (05/27/2015), Obesity, morbid, BMI 40.0-49.9 ([place]) (12/31/2015), Other chronic pain, Personal history of radiation therapy, PONV (postoperative nausea and vomiting), 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   reports that she quit smoking about  37 years ago. Her smoking use included cigarettes. She has a 9.00 pack-year smoking history. She has never used smokeless tobacco.  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   BREAST LUMPECTOMY Right 2016   BREAST LUMPECTOMY WITH NEEDLE LOCALIZATION Right 02/11/2015   Procedure: WIRE BRACKETED RIGHT BREAST LUMPECTOMY ;  Surgeon: Excell Seltzer, MD;  Location: [place];  Service: General;  Laterality: Right;   CATARACT EXTRACTION, BILATERAL  10-05-2017 left ; 11-09-2017 right   Dr Kandy Garrison    colonoscopy with biopsy  12/28/2003   neg  LYMPH NODE DISSECTION Right 02/23/2020   Procedure: right inguinal lymph node excision;  Surgeon: Alphonsa Overall, MD;  Location: [place];  Service: General;  Laterality: Right;   REVERSE SHOULDER ARTHROPLASTY Left 07/14/2018   Procedure: REVERSE SHOULDER ARTHROPLASTY;  Surgeon: Justice Britain, MD;  Location: [facility];  Service: Orthopedics;  Laterality: Left;  Interscalene Block   TONSILLECTOMY     1954   TOTAL KNEE ARTHROPLASTY Right 05/27/2015   Procedure: RIGHT TOTAL KNEE ARTHROPLASTY;  Surgeon: Gaynelle Arabian, MD;  Location: [facility];  Service: Orthopedics;  Laterality: Right;   TOTAL KNEE ARTHROPLASTY Left 05/20/2020   Procedure: TOTAL KNEE ARTHROPLASTY;  Surgeon: Gaynelle Arabian, MD;  Location: [facility];  Service: Orthopedics;  Laterality: Left;  54mn   WISDOM TOOTH EXTRACTION Bilateral 1965    Allergies  Allergen Reactions   Levofloxacin Other (See Comments)    Affected tendons, severe pain and swelling   Tetracyclines & Related Swelling    tongue swelling and peeling   Other Diarrhea, Rash and Other (See Comments)    Crabs - diarrhea. Bay leaves - rash in her mouth   Ciprofloxacin     Tendon problems    Flonase [Fluticasone]     Nasal spray cause nose bleeds   Ibuprofen     Gets mouth ulcers if she takes for more than 3 days; Aleve okay     Nitrofurantoin Monohyd Macro Hives   Rosuvastatin     Muscle and joint pain on 158mdaily dosing   Scopolamine     "Headache and redness of eyes"    Immunization History  Administered Date(s) Administered   Influenza Split 03/15/2008, 02/22/2015, 03/16/2020   Influenza, High Dose Seasonal PF 03/24/2017, 04/01/2018, 02/09/2019, 02/15/2020   Influenza,inj,Quad PF,6+ Mos 04/06/2012   Influenza,inj,quad, With Preservative 03/06/2014, 03/19/2016, 02/13/2017, 03/15/2018, 03/16/2019   PFIZER Comirnaty(Gray Top)Covid-19 Tri-Sucrose Vaccine 09/25/2020   PFIZER(Purple Top)SARS-COV-2 Vaccination 07/07/2019, 07/28/2019, 03/12/2020   Pneumococcal Conjugate-13 03/14/2015   Pneumococcal Polysaccharide-23 05/23/2010, 03/24/2017   Td 07/30/2003   Tdap 07/29/2009   Zoster, Live 07/29/2005, 04/19/2020    Family History  Problem Relation Age of Onset   Dementia Mother    Other Mother [id number]     "spot on lung"; secondhand smoke exposure   Pulmonary fibrosis Father    Diabetes Brother    Thyroid disease Brother    Leukemia Brother [id number]     chronic lymphocytic    Stomach cancer Maternal Grandmother        dx. 3031s Heart attack Maternal Grandfather    Diabetes Paternal Grandfather    Stroke Paternal Grandfather    Colitis Paternal Grandmother    Parkinson's disease Maternal Aunt    Diabetes Paternal Aunt    Other Paternal Aunt        bowel obstruction   Heart Problems Paternal Uncle    Ovarian cancer Maternal Aunt        dx. 5074spaternal half sister of her mother   Multiple myeloma Maternal Aunt    Multiple myeloma Maternal Aunt 75   Kidney failure Paternal Aunt    Pulmonary fibrosis Paternal Aunt    Stroke Paternal Aunt    Heart Problems Paternal Aunt    Emphysema Paternal Uncle    Heart Problems Paternal Uncle    Lung cancer Paternal Uncle        smoker   Colon cancer Cousin    Breast cancer Cousin        dx.  29s   Pulmonary fibrosis Cousin      Current Outpatient  Medications:    Calcium Carbonate-Vitamin D 600-400 MG-UNIT tablet, 2 tablets, Disp: , Rfl:    COVID-19 mRNA Vac-TriS, Pfizer, SUSP injection, Inject into the muscle., Disp: 0.3 mL, Rfl: 0   levothyroxine (SYNTHROID) 112 MCG tablet, , Disp: , Rfl:    pravastatin (PRAVACHOL) 20 MG tablet, Take 1 tablet (20 mg total) by mouth every evening., Disp: 30 tablet, Rfl: 11  Current Facility-Administered Medications:    0.9 %  sodium chloride infusion, , Intravenous, PRN, Eileen Stanford, PA-C      Objective:   Vitals:   01/31/21 1539  BP: 122/78  Pulse: 65  Temp: (!) 97.4 F (36.3 C)  TempSrc: Oral  SpO2: 99%  Weight: 194 lb 9.6 oz (88.3 kg)  Height: 5' 4.5" (1.638 m)    Estimated body mass index is 32.89 kg/m as calculated from the following:   Height as of this encounter: 5' 4.5" (1.638 m).   Weight as of this encounter: 194 lb 9.6 oz (88.3 kg).  '@WEIGHTCHANGE' @  [employer]   01/31/21 1539  Weight: 194 lb 9.6 oz (88.3 kg)     Physical Exam   General: No distress. obese Neuro: Alert and Oriented x 3. GCS 15. Speech normal Psych: Pleasant Resp:  Barrel Chest - no.  Wheeze - no, Crackles - no, No overt respiratory distress CVS: Normal heart sounds. Murmurs - no Ext: Stigmata of Connective Tissue Disease - no HEENT: Normal upper airway. PEERL +. No post nasal drip        Assessment:       ICD-10-CM   1. Axillary lymphadenopathy  R59.0     2. Lymphadenopathy  R59.1     3. History of breast cancer  Z85.3     4. Pulmonary nodule  R91.1     5. Family history of pulmonary fibrosis  Z83.6     6. Coronary artery calcification seen on CAT scan  I25.10          Plan:     Patient Instructions  Axillary lymphadenopathy Lymphadenopathy Granulomatous adenopathy - right inguinal node sep 2021 History of breast cancer  -There is enlargement of the left axillary lymph node compared to August 2021. In addition some of the abdominal lymph nodes are larger. This  could all be granuloma as evidenced by your right inguinal biopsy in September 2021. However in the setting of breast cancer -needle biopsy in June 2022 showed some atypical cells.  Noticed that now he is scheduled for excision biopsy on 02/18/2021  Plan - Go through with excision biopsy 02/18/2021 -At this point in time we will hold off on any steroids for sarcoidosis consideration -Decisions on approaching this based on biopsy of the left axillary node after 02/18/2021  Pulmonary nodules - multiple   -CT scan of the chest February 2022 shows resolution of the left lower lobe lymph node, persistence of the left upper lobe lymph node and multiple very tiny other nodules that are new compared to August 2021  -CT scan chest results from 01/31/2021 are pending  Plan - We will call you with the results  Family history of pulmonary fibrosis  CT scan of the chest February 2022 shows no evidence of pulmonary fibrosis -Genetic testing for pulmonary fibrosis is noncontributory in January 2022 -Glad you are feeling well without any shortness of breath  Plan -Expectant follow-up  Coronary artery calcification seen on CT scan February 2022  -  Glad you not have chest pain but I do not see evidence of cardiac stress test. -Last visit I referred you to cardiology.  Did you see cardiology?  Plan -Given risk factors refer to cardiology but if you want to hold off then please take this up with your primary care physician   Follow-up -Return -as needed but can decide based on CT scan results from 01/31/2021 today    SIGNATURE    Dr. Brand Males, M.D., F.C.C.P,  Pulmonary and Critical Care Medicine Staff Physician, [place] Director - Interstitial Lung Disease  Program  [facility] [place] [facility] [place], [place], [address]  Pager: [phone], If no answer or between  15:00h - 7:00h: call [phone] Telephone: [phone]  4:32 PM 01/31/2021

## 2021-02-04 ENCOUNTER — Inpatient Hospital Stay: Payer: Medicare Other | Attending: Oncology

## 2021-02-04 ENCOUNTER — Other Ambulatory Visit: Payer: Self-pay

## 2021-02-04 ENCOUNTER — Inpatient Hospital Stay (HOSPITAL_BASED_OUTPATIENT_CLINIC_OR_DEPARTMENT_OTHER): Payer: Medicare Other | Admitting: Oncology

## 2021-02-04 VITALS — BP 124/69 | HR 84 | Temp 97.7°F | Resp 18 | Ht 64.0 in | Wt 194.2 lb

## 2021-02-04 DIAGNOSIS — D696 Thrombocytopenia, unspecified: Secondary | ICD-10-CM | POA: Diagnosis not present

## 2021-02-04 DIAGNOSIS — C50411 Malignant neoplasm of upper-outer quadrant of right female breast: Secondary | ICD-10-CM | POA: Diagnosis not present

## 2021-02-04 DIAGNOSIS — Z170 Estrogen receptor positive status [ER+]: Secondary | ICD-10-CM | POA: Diagnosis not present

## 2021-02-04 DIAGNOSIS — Z853 Personal history of malignant neoplasm of breast: Secondary | ICD-10-CM | POA: Insufficient documentation

## 2021-02-04 DIAGNOSIS — R591 Generalized enlarged lymph nodes: Secondary | ICD-10-CM | POA: Insufficient documentation

## 2021-02-04 DIAGNOSIS — Z923 Personal history of irradiation: Secondary | ICD-10-CM | POA: Insufficient documentation

## 2021-02-04 DIAGNOSIS — D479 Neoplasm of uncertain behavior of lymphoid, hematopoietic and related tissue, unspecified: Secondary | ICD-10-CM

## 2021-02-04 DIAGNOSIS — D72819 Decreased white blood cell count, unspecified: Secondary | ICD-10-CM | POA: Diagnosis not present

## 2021-02-04 DIAGNOSIS — Z87891 Personal history of nicotine dependence: Secondary | ICD-10-CM | POA: Insufficient documentation

## 2021-02-04 DIAGNOSIS — Z79899 Other long term (current) drug therapy: Secondary | ICD-10-CM | POA: Insufficient documentation

## 2021-02-04 DIAGNOSIS — Z86000 Personal history of in-situ neoplasm of breast: Secondary | ICD-10-CM | POA: Diagnosis not present

## 2021-02-04 LAB — COMPREHENSIVE METABOLIC PANEL
ALT: 14 U/L (ref 0–44)
AST: 16 U/L (ref 15–41)
Albumin: 4 g/dL (ref 3.5–5.0)
Alkaline Phosphatase: 71 U/L (ref 38–126)
Anion gap: 10 (ref 5–15)
BUN: 16 mg/dL (ref 8–23)
CO2: 25 mmol/L (ref 22–32)
Calcium: 9.8 mg/dL (ref 8.9–10.3)
Chloride: 109 mmol/L (ref 98–111)
Creatinine, Ser: 1.15 mg/dL — ABNORMAL HIGH (ref 0.44–1.00)
GFR, Estimated: 49 mL/min — ABNORMAL LOW (ref >60)
Glucose, Bld: 103 mg/dL — ABNORMAL HIGH (ref 70–99)
Potassium: 4.3 mmol/L (ref 3.5–5.1)
Sodium: 144 mmol/L (ref 135–145)
Total Bilirubin: 1 mg/dL (ref 0.3–1.2)
Total Protein: 6.6 g/dL (ref 6.5–8.1)

## 2021-02-04 LAB — CBC WITH DIFFERENTIAL/PLATELET
Abs Immature Granulocytes: 0.02 10*3/uL (ref 0.00–0.07)
Basophils Absolute: 0 10*3/uL (ref 0.0–0.1)
Basophils Relative: 0 %
Eosinophils Absolute: 0.1 10*3/uL (ref 0.0–0.5)
Eosinophils Relative: 3 %
HCT: 38.7 % (ref 36.0–46.0)
Hemoglobin: 13.5 g/dL (ref 12.0–15.0)
Immature Granulocytes: 1 %
Lymphocytes Relative: 33 %
Lymphs Abs: 0.9 10*3/uL (ref 0.7–4.0)
MCH: 30.3 pg (ref 26.0–34.0)
MCHC: 34.9 g/dL (ref 30.0–36.0)
MCV: 86.8 fL (ref 80.0–100.0)
Monocytes Absolute: 0.3 10*3/uL (ref 0.1–1.0)
Monocytes Relative: 12 %
Neutro Abs: 1.4 10*3/uL — ABNORMAL LOW (ref 1.7–7.7)
Neutrophils Relative %: 51 %
Platelets: 74 10*3/uL — ABNORMAL LOW (ref 150–400)
RBC: 4.46 MIL/uL (ref 3.87–5.11)
RDW: 13.5 % (ref 11.5–15.5)
WBC: 2.7 10*3/uL — ABNORMAL LOW (ref 4.0–10.5)
nRBC: 0 % (ref 0.0–0.2)

## 2021-02-04 LAB — LACTATE DEHYDROGENASE: LDH: 94 U/L — ABNORMAL LOW (ref 98–192)

## 2021-02-04 NOTE — Progress Notes (Signed)
[place]  Telephone:([phone]) [phone] Fax:(336) [phone]     ID: CLARISE CHACKO DOB: [birth year]  MR#: [id number]  XQJ#:[id number]  Patient Care Team: Cari Caraway, MD as PCP - General (Family Medicine) Hasana Alcorta, Virgie Dad, MD as Consulting Physician (Oncology) Excell Seltzer, MD (Inactive) as Consulting Physician (General Surgery) Bobbye Charleston, MD as Consulting Physician (Obstetrics and Gynecology) Justice Britain, MD as Consulting Physician (Orthopedic Surgery) Rolm Bookbinder, MD as Consulting Physician (General Surgery) OTHER MD:   CHIEF COMPLAINT:  Ductal carcinoma in situ; lymphadenopathy  CURRENT TREATMENT: Pending lymph node excisional biopsy   INTERVAL HISTORY: Elizabeth Ramirez returns today for follow-up of her lymphadenopathy.  She also has a history of remote estrogen receptor positive breast cancer for which she she continues on observation.   She underwent bilateral screening mammography with tomography at [facility] on 01/23/2021 showing: breast density category B; no evidence of malignancy in either breast.   She also underwent chest CT on 01/31/2021 for follow up of pulmonary nodules. This showed: stable left upper lobe pulmonary nodule, safely considered benign; interval development of multiple new 4-5 mm pulmonary nodules within right upper and lower lobes, indeterminate; progressive pathologic adenopathy within left axilla; stable left periaortic lymphadenopathy within visualized upper abdomen.  She is scheduled for left axillary lymph node excision under Dr. Donne Hazel on 02/18/2021.   REVIEW OF SYSTEMS: Elizabeth Ramirez has lost about 75 pounds in 18 months she tells me by following the NOOM program.  She also exercises about 3 days a week for about 30 minutes and enjoys it.  Her left knee surgery is a little bit of a limitation in that regard.  She has had no temperatures, no unexplained fatigue, no shortness of breath cough or phlegm production no rash  and no pruritus.  A detailed review of systems was otherwise stable   COVID 19 VACCINATION STATUS: Pfizer x4, most recently 09/2020; infection 10/2020   BREAST CANCER HISTORY: From the original intake note:  Teyah underwent Right lumpectomy and sentinel lymph node biopsy April 2005 for 4 mm, grade 1 tubular tumor (T1a No, stage IA), in the setting of DCIS. This was estrogen and progesterone receptor positive, HER-2 not amplified. Margins were ample. She participated in the MA-27 study and received anastrozole between June 2005 and June 2010, at which time she was released from follow-up.  More recently, Zenab had screening bilateral mammography showing a suspicious area in the right breast and on 12/14/2014 underwent right diagnostic mammography at the breast Center. The breast density was category A. There was a group of pleomorphic calcifications in the upper central right breast spanning up to 2.8 cm. Biopsy of this area was obtained 12/21/2014 ([id number]) and showed ductal carcinoma in situ, grade 2 or 3, estrogen receptor 95% positive, and progesterone receptor 60% positive, both with strong staining intensity.  The patient's case was presented at the multidisciplinary breast cancer conference 01/02/2015. At that time a preliminary plan was proposed, namely genetics counseling and if no mutation noted breast MRI to be followed by lumpectomy, radiation, and anti-estrogens.  Her subsequent history is as detailed below.   PAST MEDICAL HISTORY: Past Medical History:  Diagnosis Date   Allergy    tetracycline, levofloxacin, crabs & bay leaves   Arthritis    Rt knee, Rt shoulder   Breast cancer ([place]) 2016; 2005   Separate primaries; 2016 - DCIS of R breast(surgery and radiation-last 04-04-15); ER/PR+ April 2005   Cancer [facility])    Ear infection 10/19/2013  Family history of breast cancer    Family history of multiple myeloma    Family history of ovarian cancer    Family history of  pulmonary fibrosis    Family history of stomach cancer    Genetic testing 01/18/2015   Negative - no mutations found in any of 20 genes on the Breast/Ovarian Cancer Panel.  Additionally, no variants of uncertain significance (VUSs) were found.  The Breast/Ovarian gene panel offered by [facility] [name], MD) includes sequencing and deletion/duplication analysis for the following 19 genes:  ATM, BARD1, BRCA1, BRCA2, BRIP1, CDH1, CHEK2, FANCC, MLH1, MSH2, MSH6, NBN, PALB2, PMS2, PTEN, RAD51C, RAD51D, TP53, and XRCC2.  This panel also includes deletion/duplication analysis (without sequencing) for one gene, EPCAM.  Date of report is January 14, 2015.     Heart murmur    History of kidney stones    x1 '71   Hypothyroid 07/29/2011   Hypothyroidism    Malignant neoplasm of upper-outer quadrant of right breast in female, estrogen receptor positive ([place]) 01/14/2015   OA (osteoarthritis) of knee 05/27/2015   Obesity, morbid, BMI 40.0-49.9 ([place]) 12/31/2015   Other chronic pain    Personal history of radiation therapy    PONV (postoperative nausea and vomiting)    extreme nausea and vomiting - cannot tolerate scopalamine    S/P total knee arthroplasty 05/27/2015   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   Right proximal bridge of nose ([id number])   Seasonal allergies    Thyroid disease    Vitamin D deficiency     PAST SURGICAL HISTORY: 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   BREAST LUMPECTOMY Right 2016   BREAST LUMPECTOMY WITH NEEDLE LOCALIZATION Right 02/11/2015   Procedure: WIRE BRACKETED RIGHT BREAST LUMPECTOMY ;  Surgeon: Excell Seltzer, MD;  Location: [place];  Service: General;  Laterality: Right;   CATARACT EXTRACTION, BILATERAL  10-05-2017 left ; 11-09-2017 right   Dr Kandy Garrison    colonoscopy with biopsy  12/28/2003   neg   LYMPH NODE DISSECTION Right 02/23/2020   Procedure:  right inguinal lymph node excision;  Surgeon: Alphonsa Overall, MD;  Location: [place];  Service: General;  Laterality: Right;   REVERSE SHOULDER ARTHROPLASTY Left 07/14/2018   Procedure: REVERSE SHOULDER ARTHROPLASTY;  Surgeon: Justice Britain, MD;  Location: [facility];  Service: Orthopedics;  Laterality: Left;  Interscalene Block   TONSILLECTOMY     1954   TOTAL KNEE ARTHROPLASTY Right 05/27/2015   Procedure: RIGHT TOTAL KNEE ARTHROPLASTY;  Surgeon: Gaynelle Arabian, MD;  Location: [facility];  Service: Orthopedics;  Laterality: Right;   TOTAL KNEE ARTHROPLASTY Left 05/20/2020   Procedure: TOTAL KNEE ARTHROPLASTY;  Surgeon: Gaynelle Arabian, MD;  Location: [facility];  Service: Orthopedics;  Laterality: Left;  5mn   WISDOM TOOTH EXTRACTION Bilateral 1965    FAMILY HISTORY Family History  Problem Relation Age of Onset   Dementia Mother    Other Mother 850      "spot on lung"; secondhand smoke exposure   Pulmonary fibrosis Father    Diabetes Brother    Thyroid disease Brother    Leukemia Brother 718      chronic lymphocytic    Stomach cancer Maternal Grandmother        dx. 338s  Heart attack Maternal Grandfather    Diabetes Paternal Grandfather    Stroke Paternal Grandfather    Colitis Paternal  Grandmother    Parkinson's disease Maternal Aunt    Diabetes Paternal Aunt    Other Paternal Aunt        bowel obstruction   Heart Problems Paternal Uncle    Ovarian cancer Maternal Aunt        dx. 79s; paternal half sister of her mother   Multiple myeloma Maternal Aunt    Multiple myeloma Maternal Aunt 75   Kidney failure Paternal Aunt    Pulmonary fibrosis Paternal Aunt    Stroke Paternal Aunt    Heart Problems Paternal Aunt    Emphysema Paternal Uncle    Heart Problems Paternal Uncle    Lung cancer Paternal Uncle        smoker   Colon cancer Cousin    Breast cancer Cousin        dx. 85s   Pulmonary fibrosis Elizabeth Ramirez father died at the age of 31 from pulmonary  fibrosis. Her mother died at age 43 with severe dementia. The patient had one brother no sisters. Her maternal grandmother died from stomach cancer the age of 53. The patient's mother had a half sister with ovarian cancer.   GYNECOLOGIC HISTORY:  No LMP recorded. Patient is postmenopausal.  menarche age 8, she isGX P0. Menopause 2003. Did not take hormone replacement   SOCIAL HISTORY:  Works as a Cabin crew, Publishing copy in renovations. She retired from [employer] as of 2018 and is now doing some Electronics engineer. At home it's her and Elizabeth Ramirez , who also is in real estate, with her own investment business.Elizabeth Ramirez is a catholic, though not currently practicing    ADVANCED DIRECTIVES:  [name] is Chief Technology Officer of attorney   HEALTH MAINTENANCE: Social History   Tobacco Use   Smoking status: Former    Packs/day: 1.00    Years: 9.00    Pack years: 9.00    Types: Cigarettes    Quit date: 06/16/1983    Years since quitting: 37.6   Smokeless tobacco: Never  Vaping Use   Vaping Use: Never used  Substance Use Topics   Alcohol use: Yes    Alcohol/week: 2.0 standard drinks    Types: 2 Glasses of wine per week    Comment: Twice a week   Drug use: No     Colonoscopy:  20/15, [place] she knee  PAP: up-to-date/[place]  Bone density: October 2016; normal  Lipid panel:  Allergies  Allergen Reactions   Levofloxacin Other (See Comments)    Affected tendons, severe pain and swelling   Tetracyclines & Related Swelling    tongue swelling and peeling   Other Diarrhea, Rash and Other (See Comments)    Crabs - diarrhea. Bay leaves - rash in her mouth   Ciprofloxacin     Tendon problems    Flonase [Fluticasone]     Nasal spray cause nose bleeds   Ibuprofen     Gets mouth ulcers if she takes for more than 3 days; Aleve okay    Nitrofurantoin Monohyd Macro Hives   Rosuvastatin     Muscle and joint pain on 23m daily dosing   Scopolamine     "Headache and redness of eyes"     Current Outpatient Medications  Medication Sig Dispense Refill   Calcium Carbonate-Vitamin D 600-400 MG-UNIT tablet 2 tablets     COVID-19 mRNA Vac-TriS, Pfizer, SUSP injection Inject into the muscle. 0.3 mL 0   levothyroxine (SYNTHROID) 112 MCG tablet      pravastatin (  PRAVACHOL) 20 MG tablet Take 1 tablet (20 mg total) by mouth every evening. 30 tablet 11   Current Facility-Administered Medications  Medication Dose Route Frequency Provider Last Rate Last Admin   0.9 %  sodium chloride infusion   Intravenous PRN Eileen Stanford, PA-C        OBJECTIVE: White woman in no acute distress  Vitals:   02/04/21 1002  BP: 124/69  Pulse: 84  Resp: 18  Temp: 97.7 F (36.5 C)  SpO2: 97%      Body mass index is 33.33 kg/m.    ECOG FS:1 - Symptomatic but completely ambulatory  Sclerae unicteric, EOMs intact Wearing a mask No cervical or supraclavicular adenopathy Lungs no rales or rhonchi Heart regular rate and rhythm Abd soft, nontender, positive bowel sounds MSK no focal spinal tenderness, no upper extremity lymphedema Neuro: nonfocal, well oriented, appropriate affect Breasts: I can palpate a flat soft 1 cm lymph node deep in the left axilla with difficulty   RESULTS: Angiotensin-converting enzyme levels 03/01/2020 and 05/06/2020 were both normal  CMP     Component Value Date/Time   NA 144 02/04/2021 0946   K 4.3 02/04/2021 0946   CL 109 02/04/2021 0946   CO2 25 02/04/2021 0946   GLUCOSE 103 (H) 02/04/2021 0946   BUN 16 02/04/2021 0946   CREATININE 1.15 (H) 02/04/2021 0946   CREATININE 1.28 (H) 01/26/2020 1220   CALCIUM 9.8 02/04/2021 0946   PROT 6.6 02/04/2021 0946   ALBUMIN 4.0 02/04/2021 0946   AST 16 02/04/2021 0946   AST 14 (L) 01/26/2020 1220   ALT 14 02/04/2021 0946   ALT 15 01/26/2020 1220   ALKPHOS 71 02/04/2021 0946   BILITOT 1.0 02/04/2021 0946   BILITOT 1.0 01/26/2020 1220   GFRNONAA 49 (L) 02/04/2021 0946   GFRNONAA 41 (L) 01/26/2020 1220    GFRAA 51 (L) 03/01/2020 1447   GFRAA 47 (L) 01/26/2020 1220    INo results found for: SPEP, UPEP  Lab Results  Component Value Date   WBC 2.7 (L) 02/04/2021   NEUTROABS 1.4 (L) 02/04/2021   HGB 13.5 02/04/2021   HCT 38.7 02/04/2021   MCV 86.8 02/04/2021   PLT 74 (L) 02/04/2021      Chemistry      Component Value Date/Time   NA 144 02/04/2021 0946   K 4.3 02/04/2021 0946   CL 109 02/04/2021 0946   CO2 25 02/04/2021 0946   BUN 16 02/04/2021 0946   CREATININE 1.15 (H) 02/04/2021 0946   CREATININE 1.28 (H) 01/26/2020 1220      Component Value Date/Time   CALCIUM 9.8 02/04/2021 0946   ALKPHOS 71 02/04/2021 0946   AST 16 02/04/2021 0946   AST 14 (L) 01/26/2020 1220   ALT 14 02/04/2021 0946   ALT 15 01/26/2020 1220   BILITOT 1.0 02/04/2021 0946   BILITOT 1.0 01/26/2020 1220       Lab Results  Component Value Date   LABCA2 22 12/04/2008    No components found for: [id number]  No results for input(s): INR in the last 168 hours.  Urinalysis    Component Value Date/Time   COLORURINE YELLOW 05/20/2015 1120   APPEARANCEUR CLEAR 05/20/2015 1120   LABSPEC 1.009 05/20/2015 1120   PHURINE 6.5 05/20/2015 1120   GLUCOSEU NEGATIVE 05/20/2015 1120   HGBUR NEGATIVE 05/20/2015 1120   BILIRUBINUR NEGATIVE 05/20/2015 1120   BILIRUBINUR neg 12/19/2014 [place] 05/20/2015 1120   PROTEINUR NEGATIVE 05/20/2015 1120  UROBILINOGEN 0.2 12/19/2014 1427   NITRITE NEGATIVE 05/20/2015 1120   LEUKOCYTESUR NEGATIVE 05/20/2015 1120    STUDIES: CT Chest Wo Contrast  Result Date: 02/02/2021 CLINICAL DATA:  Pulmonary nodule, breast cancer EXAM: CT CHEST WITHOUT CONTRAST TECHNIQUE: Multidetector CT imaging of the chest was performed following the standard protocol without IV contrast. COMPARISON:  08/02/2020 FINDINGS: Cardiovascular: Mild coronary artery calcification. Global cardiac size within normal limits. No pericardial effusion. Central pulmonary arteries are enlarged  in keeping with changes of pulmonary arterial hypertension. Mild atherosclerotic calcification within the thoracic aorta. No aortic aneurysm. Mediastinum/Nodes: Biopsy marker now identified within the dominant pathologically enlarged lymph node within the left axilla. Pathologic left axillary lymphadenopathy has progressed in the interval since prior examination with the dominant lymph node measuring 1.8 x 1.7 x 3.3 cm in greatest dimension and a a new 13 mm x 17 mm lymph node seen medially at axial image # 29/2. No mediastinal or right axillary adenopathy identified. The esophagus is unremarkable. Lungs/Pleura: The previously identified pulmonary nodule within the left upper lobe appears stable, measuring 5 mm at axial image # 67/5. However, multiple additional pulmonary nodules have developed measuring 4-5 mm in size seen within the right upper lobe at axial image # 62/5, and left lower lobe at axial image # 78/5, indeterminate. No confluent pulmonary infiltrate. No pneumothorax or pleural effusion. Central airways are widely patent. Upper Abdomen: No acute abnormality. Stable left periaortic lymphadenopathy with the index lymph node measuring 12 mm x 17 mm at axial image # 149/2. Musculoskeletal: Partial right breast resection has been performed. Left total shoulder arthroplasty has been performed. Advanced degenerative changes are seen within the right shoulder. No acute bone abnormality. No suspicious lytic or blastic bone lesions are identified. Stable vertebral hemangioma at T11. IMPRESSION: Stable left upper lobe pulmonary nodule, safely considered benign. Interval development of multiple new 4-5 mm pulmonary nodules within the right upper lobe and right lower lobe, indeterminate. No follow-up needed if patient is low-risk (and has no known or suspected primary neoplasm). Non-contrast chest CT can be considered in 12 months if patient is high-risk. This recommendation follows the consensus statement:  Guidelines for Management of Incidental Pulmonary Nodules Detected on CT Images: From the Fleischner Society 2017; Radiology 2017; 284:228-243. Progressive pathologic adenopathy within the left axilla. Interval biopsy of the dominant lymph node within the left axilla with biopsy marker placed. Stable left periaortic lymphadenopathy within the visualized upper abdomen. Mild coronary artery calcification. Morphologic changes in keeping with pulmonary arterial hypertension. Aortic Atherosclerosis (ICD10-I70.0). Electronically Signed   By: Fidela Salisbury M.D.   On: 02/02/2021 02:19   CT CARDIAC SCORING (SELF PAY ONLY)  Addendum Date: 01/23/2021   ADDENDUM REPORT: 01/23/2021 11:55 CLINICAL DATA:  Cardiovascular Disease Risk stratification EXAM: Coronary Calcium Score TECHNIQUE: A gated, non-contrast computed tomography scan of the heart was performed using 62m slice thickness. Axial images were analyzed on a dedicated workstation. Calcium scoring of the coronary arteries was performed using the Agatston method. FINDINGS: Coronary arteries: Normal origins. Coronary Calcium Score: Left main: 0 Left anterior descending artery: 291 Left circumflex artery: 0 Right coronary artery: 0 Total: 291 Percentile: 76 Pericardium: Normal. Ascending Aorta: Normal caliber. Non-cardiac: [facility]. IMPRESSION: Coronary calcium score of 291. This was 780percentile for age-, race-, and sex-matched controls. RECOMMENDATIONS: Coronary artery calcium (CAC) score is a strong predictor of incident coronary heart disease (CHD) and provides predictive information beyond traditional risk factors. CAC scoring is reasonable to use  in the decision to withhold, postpone, or initiate statin therapy in intermediate-risk or selected borderline-risk asymptomatic adults (age 10-75 years and LDL-C >=70 to <190 mg/dL) who do not have diabetes or established atherosclerotic cardiovascular disease (ASCVD).* In  intermediate-risk (10-year ASCVD risk >=7.5% to <20%) adults or selected borderline-risk (10-year ASCVD risk >=5% to <7.5%) adults in whom a CAC score is measured for the purpose of making a treatment decision the following recommendations have been made: If CAC=0, it is reasonable to withhold statin therapy and reassess in 5 to 10 years, as long as higher risk conditions are absent (diabetes mellitus, family history of premature CHD in first degree relatives (males <55 years; females <65 years), cigarette smoking, or LDL >=190 mg/dL). If CAC is 1 to 99, it is reasonable to initiate statin therapy for patients >=63 years of age. If CAC is >=100 or >=75th percentile, it is reasonable to initiate statin therapy at any age. Cardiology referral should be considered for patients with CAC scores >=400 or >=75th percentile. *2018 AHA/ACC/AACVPR/AAPA/ABC/ACPM/ADA/AGS/APhA/ASPC/NLA/PCNA Guideline on the Management of Blood Cholesterol: A Report of the American College of Cardiology/American Heart Association Task Force on Clinical Practice Guidelines. J Am Coll Cardiol. 2019;73(24):3168-3209. Electronically Signed   By: Candee Furbish MD   On: 01/23/2021 11:55   Result Date: 01/23/2021 EXAM: OVER-READ INTERPRETATION  CT CHEST The following report is an over-read performed by radiologist Dr. Vinnie Langton of [facility], [place] on 01/23/2021. This over-read does not include interpretation of cardiac or coronary anatomy or pathology. The coronary calcium score interpretation by the cardiologist is attached. COMPARISON:  Coronary calcium score 10/01/2020. FINDINGS: 4 mm left upper lobe pulmonary nodule (axial image 6 of series 3), stable compared to prior examinations dating back to 03/07/2019, considered definitively benign. Other previously noted small left lower lobe pulmonary nodule is no longer clearly identified. No new suspicious appearing pulmonary nodules or masses are noted in the visualized portions of the  thorax. Within the visualized portions of the thorax there is no acute consolidative airspace disease, no pleural effusions, no pneumothorax and no lymphadenopathy. Visualized portions of the upper abdomen are unremarkable. There are no aggressive appearing lytic or blastic lesions noted in the visualized portions of the skeleton. IMPRESSION: 1. No significant incidental noncardiac findings are noted. Electronically Signed: By: Vinnie Langton M.D. On: 01/23/2021 08:54   MM 3D SCREEN BREAST BILATERAL  Result Date: 01/25/2021 CLINICAL DATA:  Screening. EXAM: DIGITAL SCREENING BILATERAL MAMMOGRAM WITH TOMOSYNTHESIS AND CAD TECHNIQUE: Bilateral screening digital craniocaudal and mediolateral oblique mammograms were obtained. Bilateral screening digital breast tomosynthesis was performed. The images were evaluated with computer-aided detection. COMPARISON:  Previous exam(s). ACR Breast Density Category b: There are scattered areas of fibroglandular density. FINDINGS: There are no findings suspicious for malignancy. IMPRESSION: No mammographic evidence of malignancy. A result letter of this screening mammogram will be mailed directly to the patient. RECOMMENDATION: Screening mammogram in one year. (Code:SM-B-01Y) BI-RADS CATEGORY  1: Negative. Electronically Signed   By: Audie Pinto M.D.   On: 01/25/2021 13:15     ASSESSMENT: 75 y.o. [place] woman  (1) status post right lumpectomy April 2005 for a pT1a pN0, stage IA invasive breast cancer, estrogen and progesterone receptor positive, HER-2 negative, in the setting of DCIS;   (a) did not receive radiation  (b) status post anastrozole for 5 years completed June 2010  (2) status post right breast upper outer quadrant biopsy 12/21/2014 for ductal carcinoma in situ, grade 2 or 3, estrogen and progesterone receptor  positive.  (3) Status post right lumpectomy 02/11/2015 showing ductal carcinoma in situ, intermediate grade, measuring 0.9 cm, with close  but negative margins  (4) adjuvant radiation:   03/07/2015-04/04/2015    Right breast/ 42.5 Gy at 2.5 Gy per fraction x 17 fractions.   Right breast boost/ 7.5 Gy at 2.5 Gy per fraction x 3 fractions  (5) genetic testing 01/14/2015 through the [place] Panel offered by [facility] [name], MD) showed no deleterious mutations in ATM, BARD1, BRCA1, BRCA2, BRIP1, CDH1, CHEK2, FANCC, MLH1, MSH2, MSH6, NBN, PALB2, PMS2, PTEN, RAD51C, RAD51D, TP53, and XRCC2.  This panel also includes deletion/duplication analysis (without sequencing) for one gene, EPCAM.   (6) resumed anastrozole 07/06/2014, discontinued March 2017 with arthralgias and myalgias developing   (a) started letrozole 10/14/2015--discontinued after a brief course secondary to side effects  (b) Bone density 03/29/2015 was normal, with the lowest T score at 0.4  (7) additional genetics testing 07/23/2020 on the [facility] Multi-Cancer Panel + Pulmonary Fibrosis Panel found no deleterious mutations in ABCA3, ACD, AIP, ALK, APC*, ATM*, AXIN2, BAP1, BARD1, BLM, BMPR1A, BRCA1, BRCA2, BRIP1, CASR, CDC73, CDH1, CDK4, CDKN1B, CDKN1C, CDKN2A (p14ARF), CDKN2A (p16INK4a), CEBPA, CHEK2, CTC1, CTNNA1, DICER1*, DIS3L2*, DKC1, EGFR, EPCAM*, FH*, FLCN, GATA2, GPC3*, GREM1*, HOXB13, HRAS, KIT, MAX*, MEN1*, MET*, MITF, MLH1*, MSH2*, MSH3*, MSH6*, MUTYH, NAF1, NBN, NF1*, NF2, NHP2, NOP10, NTHL1, PALB2, PARN, PDGFRA, PHOX2B*, PMS2*, POLD1*, POLE, POT1, PRKAR1A, PTCH1, PTEN*, RAD50, RAD51C, RAD51D, RB1*, RECQL4*, RET, RTEL1, RUNX1, SDHA*, SDHAF2, SDHB, SDHC*, SDHD, SFTPC, SMAD4, SMARCA4, SMARCB1, SMARCE1, STK11, SUFU, TERC, TERT, TINF2, TMEM127, TP53, TSC1*, TSC2, USB1, VHL, WRAP53, WRN*, WT1.  (a) VUS in ALK identified called [id number].   (8) possible lymphoproliferative disease:   (A) chest CT obtained to follow nonspecific pulmonary nodules 01/26/2020 shows left axillary and left celiac adenopathy.  (B) PET scan 02/08/2020 shows  generalized lymphadenopathy, which is hypermetabolic, together with splenic enlargement  (C) right inguinal lymph node biopsy 19/37/9024 showed follicular hyperplasia and epithelioid granulomas, no morphologic or immunophenotypic evidence of lymphoma.  This was read as most consistent with a reactive process without excluding sarcoidosis  (D) chest CT scan 08/02/2020 again shows left axillary subpectoral celiac and retroperitoneal adenopathy as well as multiple very small scattered pulmonary nodules  (E) left axillary lymph node biopsy 11/27/2020 shows an atypical lymphoid proliferation with no B-cell monoclonal component.  (F) left axillary lymph node excision 02/18/2021  (G) bone marrow biopsy   PLAN: Neaveh is clinically very stable.  What ever process is causing her lymphadenopathy certainly is not very aggressive.  We went over the options again and this could be what I think it is a low-grade lymphoproliferative tumor, which however we have not been able to demonstrate (we have not seen any clonality).  Getting if full lymph node or at least a very good portion of the lymph node that will allow [place] to assess architecture may give [place] the diagnosis and she is already scheduled for this procedure.  The thrombocytopenia and leukopenia today I think are related to her splenomegaly.  She has no anemia and that is why we are not proceeding to bone marrow biopsy at this point but we may proceed to do that depending on the final results of her lymph node biopsy.  She will see me approximately 3 weeks after that biopsy.  At that time we will have had the opportunity to obtain additional molecular studies and hopefully will have a definitive diagnosis by then.  Total encounter time 25  minutes.*   Gaston Dase, Virgie Dad, MD  02/04/21 7:01 PM Medical Oncology and Hematology [facility] [place], [place] [address] Tel. [phone]    Fax. [phone]    I, Wilburn Mylar,  am acting as scribe for Dr. Virgie Dad. Adanna Zuckerman.  I, Lurline Del MD, have reviewed the above documentation for accuracy and completeness, and I agree with the above.   *Total Encounter Time as defined by the Centers for Medicare and Medicaid Services includes, in addition to the face-to-face time of a patient visit (documented in the note above) non-face-to-face time: obtaining and reviewing outside history, ordering and reviewing medications, tests or procedures, care coordination (communications with other health care professionals or caregivers) and documentation in the medical record.

## 2021-02-05 DIAGNOSIS — D3191 Benign neoplasm of unspecified part of right eye: Secondary | ICD-10-CM | POA: Diagnosis not present

## 2021-02-05 DIAGNOSIS — H33103 Unspecified retinoschisis, bilateral: Secondary | ICD-10-CM | POA: Diagnosis not present

## 2021-02-05 DIAGNOSIS — H35033 Hypertensive retinopathy, bilateral: Secondary | ICD-10-CM | POA: Diagnosis not present

## 2021-02-05 DIAGNOSIS — H43813 Vitreous degeneration, bilateral: Secondary | ICD-10-CM | POA: Diagnosis not present

## 2021-02-05 DIAGNOSIS — H35363 Drusen (degenerative) of macula, bilateral: Secondary | ICD-10-CM | POA: Diagnosis not present

## 2021-02-05 LAB — BETA 2 MICROGLOBULIN, SERUM: Beta-2 Microglobulin: 4 mg/L — ABNORMAL HIGH (ref 0.6–2.4)

## 2021-02-06 ENCOUNTER — Ambulatory Visit: Payer: Medicare Other

## 2021-02-12 MED ORDER — PRALUENT 75 MG/ML ~~LOC~~ SOAJ: 1.0000 "pen " | SUBCUTANEOUS | 11 refills | Status: DC

## 2021-02-14 ENCOUNTER — Other Ambulatory Visit: Payer: Self-pay

## 2021-02-14 ENCOUNTER — Encounter (HOSPITAL_BASED_OUTPATIENT_CLINIC_OR_DEPARTMENT_OTHER): Payer: Self-pay | Admitting: General Surgery

## 2021-02-14 ENCOUNTER — Ambulatory Visit
Admission: RE | Admit: 2021-02-14 | Discharge: 2021-02-14 | Disposition: A | Discharge status: Home / Self Care | Payer: Medicare Other | Source: Ambulatory Visit | Attending: General Surgery | Admitting: General Surgery

## 2021-02-14 ENCOUNTER — Other Ambulatory Visit: Payer: Self-pay | Admitting: General Surgery

## 2021-02-14 DIAGNOSIS — R590 Localized enlarged lymph nodes: Secondary | ICD-10-CM

## 2021-02-14 MED ORDER — ENSURE PRE-SURGERY PO LIQD: 296.0000 mL | Freq: Once | ORAL | Status: DC

## 2021-02-14 NOTE — Progress Notes (Signed)

## 2021-02-18 ENCOUNTER — Other Ambulatory Visit: Payer: Self-pay

## 2021-02-18 ENCOUNTER — Encounter (HOSPITAL_BASED_OUTPATIENT_CLINIC_OR_DEPARTMENT_OTHER)
Admission: RE | Disposition: A | Discharge status: Home / Self Care | Payer: Self-pay | Source: Home / Self Care | Attending: General Surgery

## 2021-02-18 ENCOUNTER — Encounter (HOSPITAL_BASED_OUTPATIENT_CLINIC_OR_DEPARTMENT_OTHER): Payer: Self-pay | Admitting: General Surgery

## 2021-02-18 ENCOUNTER — Ambulatory Visit (HOSPITAL_BASED_OUTPATIENT_CLINIC_OR_DEPARTMENT_OTHER)
Admission: RE | Admit: 2021-02-18 | Discharge: 2021-02-18 | Disposition: A | Discharge status: Home / Self Care | Payer: Medicare Other | Attending: General Surgery | Admitting: General Surgery

## 2021-02-18 ENCOUNTER — Ambulatory Visit (HOSPITAL_BASED_OUTPATIENT_CLINIC_OR_DEPARTMENT_OTHER): Payer: Medicare Other | Admitting: Anesthesiology

## 2021-02-18 ENCOUNTER — Ambulatory Visit
Admission: RE | Admit: 2021-02-18 | Discharge: 2021-02-18 | Disposition: A | Discharge status: Home / Self Care | Payer: Medicare Other | Source: Ambulatory Visit | Attending: General Surgery | Admitting: General Surgery

## 2021-02-18 DIAGNOSIS — C50411 Malignant neoplasm of upper-outer quadrant of right female breast: Secondary | ICD-10-CM | POA: Diagnosis not present

## 2021-02-18 DIAGNOSIS — Z96652 Presence of left artificial knee joint: Secondary | ICD-10-CM | POA: Diagnosis not present

## 2021-02-18 DIAGNOSIS — Z886 Allergy status to analgesic agent status: Secondary | ICD-10-CM | POA: Insufficient documentation

## 2021-02-18 DIAGNOSIS — Z803 Family history of malignant neoplasm of breast: Secondary | ICD-10-CM | POA: Diagnosis not present

## 2021-02-18 DIAGNOSIS — Z881 Allergy status to other antibiotic agents status: Secondary | ICD-10-CM | POA: Diagnosis not present

## 2021-02-18 DIAGNOSIS — Z96653 Presence of artificial knee joint, bilateral: Secondary | ICD-10-CM | POA: Insufficient documentation

## 2021-02-18 DIAGNOSIS — Z853 Personal history of malignant neoplasm of breast: Secondary | ICD-10-CM | POA: Diagnosis not present

## 2021-02-18 DIAGNOSIS — Z87891 Personal history of nicotine dependence: Secondary | ICD-10-CM | POA: Insufficient documentation

## 2021-02-18 DIAGNOSIS — R590 Localized enlarged lymph nodes: Secondary | ICD-10-CM

## 2021-02-18 DIAGNOSIS — Z888 Allergy status to other drugs, medicaments and biological substances status: Secondary | ICD-10-CM | POA: Diagnosis not present

## 2021-02-18 DIAGNOSIS — R591 Generalized enlarged lymph nodes: Secondary | ICD-10-CM | POA: Diagnosis not present

## 2021-02-18 DIAGNOSIS — G8918 Other acute postprocedural pain: Secondary | ICD-10-CM | POA: Diagnosis not present

## 2021-02-18 DIAGNOSIS — Z96651 Presence of right artificial knee joint: Secondary | ICD-10-CM | POA: Insufficient documentation

## 2021-02-18 DIAGNOSIS — Z170 Estrogen receptor positive status [ER+]: Secondary | ICD-10-CM | POA: Diagnosis not present

## 2021-02-18 HISTORY — PX: RADIOACTIVE SEED GUIDED AXILLARY SENTINEL LYMPH NODE: SHX6735

## 2021-02-18 SURGERY — RADIOACTIVE SEED GUIDED AXILLARY SENTINEL LYMPH NODE BIOPSY
Anesthesia: General | Site: Breast | Laterality: Left

## 2021-02-18 MED ORDER — ONDANSETRON HCL 4 MG/2ML IJ SOLN
INTRAMUSCULAR | Status: DC | PRN
  Administered 2021-02-18 (×2): 4 mg via INTRAVENOUS

## 2021-02-18 MED ORDER — BUPIVACAINE HCL (PF) 0.25 % IJ SOLN
INTRAMUSCULAR | Status: DC | PRN
  Administered 2021-02-18: 4 mL

## 2021-02-18 MED ORDER — OXYCODONE HCL 5 MG PO TABS: 5.0000 mg | ORAL_TABLET | Freq: Four times a day (QID) | ORAL | 0 refills | Status: DC | PRN

## 2021-02-18 MED ORDER — EPHEDRINE 5 MG/ML INJ
INTRAVENOUS | Status: AC
  Filled 2021-02-18: qty 5

## 2021-02-18 MED ORDER — ACETAMINOPHEN 500 MG PO TABS
1000.0000 mg | ORAL_TABLET | ORAL | Status: AC
  Administered 2021-02-18: 1000 mg via ORAL

## 2021-02-18 MED ORDER — FENTANYL CITRATE (PF) 100 MCG/2ML IJ SOLN
INTRAMUSCULAR | Status: AC
  Filled 2021-02-18: qty 2

## 2021-02-18 MED ORDER — DROPERIDOL 2.5 MG/ML IJ SOLN
INTRAMUSCULAR | Status: DC | PRN
  Administered 2021-02-18: .625 mg via INTRAVENOUS

## 2021-02-18 MED ORDER — 0.9 % SODIUM CHLORIDE (POUR BTL) OPTIME
TOPICAL | Status: DC | PRN
  Administered 2021-02-18: 1000 mL

## 2021-02-18 MED ORDER — DEXAMETHASONE SODIUM PHOSPHATE 10 MG/ML IJ SOLN
INTRAMUSCULAR | Status: AC
  Filled 2021-02-18: qty 1

## 2021-02-18 MED ORDER — ACETAMINOPHEN 500 MG PO TABS
ORAL_TABLET | ORAL | Status: AC
  Filled 2021-02-18: qty 2

## 2021-02-18 MED ORDER — FENTANYL CITRATE (PF) 100 MCG/2ML IJ SOLN
50.0000 ug | Freq: Once | INTRAMUSCULAR | Status: AC
  Administered 2021-02-18: 50 ug via INTRAVENOUS

## 2021-02-18 MED ORDER — LIDOCAINE HCL (CARDIAC) PF 100 MG/5ML IV SOSY
PREFILLED_SYRINGE | INTRAVENOUS | Status: DC | PRN
  Administered 2021-02-18: 50 mg via INTRAVENOUS

## 2021-02-18 MED ORDER — EPHEDRINE SULFATE 50 MG/ML IJ SOLN
INTRAMUSCULAR | Status: DC | PRN
  Administered 2021-02-18: 10 mg via INTRAVENOUS

## 2021-02-18 MED ORDER — PROPOFOL 500 MG/50ML IV EMUL
INTRAVENOUS | Status: AC
  Filled 2021-02-18: qty 50

## 2021-02-18 MED ORDER — CHLORHEXIDINE GLUCONATE CLOTH 2 % EX PADS: 6.0000 | MEDICATED_PAD | Freq: Once | CUTANEOUS | Status: DC

## 2021-02-18 MED ORDER — GLYCOPYRROLATE PF 0.2 MG/ML IJ SOSY
PREFILLED_SYRINGE | INTRAMUSCULAR | Status: AC
  Filled 2021-02-18: qty 1

## 2021-02-18 MED ORDER — PROMETHAZINE HCL 25 MG/ML IJ SOLN
INTRAMUSCULAR | Status: AC
  Filled 2021-02-18: qty 1

## 2021-02-18 MED ORDER — DEXAMETHASONE SODIUM PHOSPHATE 4 MG/ML IJ SOLN
INTRAMUSCULAR | Status: DC | PRN
  Administered 2021-02-18: 10 mg via INTRAVENOUS

## 2021-02-18 MED ORDER — PHENYLEPHRINE 40 MCG/ML (10ML) SYRINGE FOR IV PUSH (FOR BLOOD PRESSURE SUPPORT)
PREFILLED_SYRINGE | INTRAVENOUS | Status: AC
  Filled 2021-02-18: qty 10

## 2021-02-18 MED ORDER — GLYCOPYRROLATE 0.2 MG/ML IJ SOLN
INTRAMUSCULAR | Status: DC | PRN
  Administered 2021-02-18: .1 mg via INTRAVENOUS

## 2021-02-18 MED ORDER — PROMETHAZINE HCL 25 MG/ML IJ SOLN
6.2500 mg | INTRAMUSCULAR | Status: DC | PRN
  Administered 2021-02-18: 6.25 mg via INTRAVENOUS

## 2021-02-18 MED ORDER — LACTATED RINGERS IV SOLN: INTRAVENOUS | Status: DC

## 2021-02-18 MED ORDER — LIDOCAINE HCL (PF) 2 % IJ SOLN
INTRAMUSCULAR | Status: AC
  Filled 2021-02-18: qty 5

## 2021-02-18 MED ORDER — OXYCODONE HCL 5 MG PO TABS: 5.0000 mg | ORAL_TABLET | Freq: Once | ORAL | Status: DC | PRN

## 2021-02-18 MED ORDER — CEFAZOLIN SODIUM-DEXTROSE 2-4 GM/100ML-% IV SOLN
INTRAVENOUS | Status: AC
  Filled 2021-02-18: qty 100

## 2021-02-18 MED ORDER — ONDANSETRON HCL 4 MG/2ML IJ SOLN
INTRAMUSCULAR | Status: AC
  Filled 2021-02-18: qty 2

## 2021-02-18 MED ORDER — CEFAZOLIN SODIUM-DEXTROSE 2-4 GM/100ML-% IV SOLN
2.0000 g | INTRAVENOUS | Status: AC
Start: 2021-02-18 — End: 2021-02-18
  Administered 2021-02-18: 2 g via INTRAVENOUS

## 2021-02-18 MED ORDER — MIDAZOLAM HCL 2 MG/2ML IJ SOLN
2.0000 mg | Freq: Once | INTRAMUSCULAR | Status: AC
  Administered 2021-02-18: 2 mg via INTRAVENOUS

## 2021-02-18 MED ORDER — MIDAZOLAM HCL 2 MG/2ML IJ SOLN
INTRAMUSCULAR | Status: AC
  Filled 2021-02-18: qty 2

## 2021-02-18 MED ORDER — OXYCODONE HCL 5 MG/5ML PO SOLN: 5.0000 mg | Freq: Once | ORAL | Status: DC | PRN

## 2021-02-18 MED ORDER — FENTANYL CITRATE (PF) 100 MCG/2ML IJ SOLN
INTRAMUSCULAR | Status: DC | PRN
  Administered 2021-02-18 (×2): 25 ug via INTRAVENOUS

## 2021-02-18 MED ORDER — ROPIVACAINE HCL 5 MG/ML IJ SOLN
INTRAMUSCULAR | Status: DC | PRN
  Administered 2021-02-18: 30 mL via PERINEURAL

## 2021-02-18 MED ORDER — HYDROMORPHONE HCL 1 MG/ML IJ SOLN: 0.2500 mg | INTRAMUSCULAR | Status: DC | PRN

## 2021-02-18 MED ORDER — PROPOFOL 10 MG/ML IV BOLUS
INTRAVENOUS | Status: DC | PRN
  Administered 2021-02-18: 200 mg via INTRAVENOUS

## 2021-02-18 SURGICAL SUPPLY — 56 items
ADH SKN CLS APL DERMABOND .7 (GAUZE/BANDAGES/DRESSINGS) ×1
APL PRP STRL LF DISP 70% ISPRP (MISCELLANEOUS) ×1
APPLIER CLIP 9.375 MED OPEN (MISCELLANEOUS) ×2
APR CLP MED 9.3 20 MLT OPN (MISCELLANEOUS) ×1
BINDER BREAST LRG (GAUZE/BANDAGES/DRESSINGS) IMPLANT
BINDER BREAST MEDIUM (GAUZE/BANDAGES/DRESSINGS) IMPLANT
BINDER BREAST XLRG (GAUZE/BANDAGES/DRESSINGS) IMPLANT
BINDER BREAST XXLRG (GAUZE/BANDAGES/DRESSINGS) IMPLANT
BLADE SURG 15 STRL LF DISP TIS (BLADE) ×1 IMPLANT
BLADE SURG 15 STRL SS (BLADE) ×2
CANISTER SUC SOCK COL 7IN (MISCELLANEOUS) IMPLANT
CANISTER SUCT 1200ML W/VALVE (MISCELLANEOUS) IMPLANT
CHLORAPREP W/TINT 26 (MISCELLANEOUS) ×2 IMPLANT
CLIP APPLIE 9.375 MED OPEN (MISCELLANEOUS) ×1 IMPLANT
CLIP TI WIDE RED SMALL 6 (CLIP) ×2 IMPLANT
COVER BACK TABLE 60X90IN (DRAPES) ×2 IMPLANT
COVER MAYO STAND STRL (DRAPES) ×2 IMPLANT
COVER PROBE W GEL 5X96 (DRAPES) ×2 IMPLANT
DECANTER SPIKE VIAL GLASS SM (MISCELLANEOUS) IMPLANT
DERMABOND ADVANCED (GAUZE/BANDAGES/DRESSINGS) ×1
DERMABOND ADVANCED .7 DNX12 (GAUZE/BANDAGES/DRESSINGS) ×1 IMPLANT
DRAPE LAPAROSCOPIC ABDOMINAL (DRAPES) ×2 IMPLANT
DRAPE UTILITY XL STRL (DRAPES) ×2 IMPLANT
ELECT COATED BLADE 2.86 ST (ELECTRODE) ×2 IMPLANT
ELECT REM PT RETURN 9FT ADLT (ELECTROSURGICAL) ×2
ELECTRODE REM PT RTRN 9FT ADLT (ELECTROSURGICAL) ×1 IMPLANT
GLOVE SURG ENC MOIS LTX SZ7 (GLOVE) ×4 IMPLANT
GLOVE SURG UNDER POLY LF SZ7.5 (GLOVE) ×2 IMPLANT
GOWN STRL REUS W/ TWL LRG LVL3 (GOWN DISPOSABLE) ×2 IMPLANT
GOWN STRL REUS W/TWL LRG LVL3 (GOWN DISPOSABLE) ×4
HEMOSTAT ARISTA ABSORB 3G PWDR (HEMOSTASIS) ×2 IMPLANT
KIT MARKER MARGIN INK (KITS) IMPLANT
NDL SAFETY ECLIPSE 18X1.5 (NEEDLE) IMPLANT
NEEDLE HYPO 18GX1.5 SHARP (NEEDLE)
NEEDLE HYPO 25X1 1.5 SAFETY (NEEDLE) ×2 IMPLANT
NS IRRIG 1000ML POUR BTL (IV SOLUTION) ×2 IMPLANT
PACK BASIN DAY SURGERY FS (CUSTOM PROCEDURE TRAY) ×2 IMPLANT
PENCIL SMOKE EVACUATOR (MISCELLANEOUS) ×2 IMPLANT
RETRACTOR ONETRAX LX 90X20 (MISCELLANEOUS) ×2 IMPLANT
SLEEVE SCD COMPRESS KNEE MED (STOCKING) ×2 IMPLANT
SPONGE T-LAP 4X18 ~~LOC~~+RFID (SPONGE) ×2 IMPLANT
STRIP CLOSURE SKIN 1/2X4 (GAUZE/BANDAGES/DRESSINGS) ×2 IMPLANT
SUT ETHILON 2 0 FS 18 (SUTURE) IMPLANT
SUT MNCRL AB 4-0 PS2 18 (SUTURE) ×2 IMPLANT
SUT MON AB 5-0 PS2 18 (SUTURE) IMPLANT
SUT SILK 2 0 SH (SUTURE) IMPLANT
SUT VIC AB 2-0 SH 27 (SUTURE) ×2
SUT VIC AB 2-0 SH 27XBRD (SUTURE) ×1 IMPLANT
SUT VIC AB 3-0 SH 27 (SUTURE) ×2
SUT VIC AB 3-0 SH 27X BRD (SUTURE) ×1 IMPLANT
SUT VIC AB 5-0 PS2 18 (SUTURE) IMPLANT
SYR CONTROL 10ML LL (SYRINGE) ×2 IMPLANT
TOWEL GREEN STERILE FF (TOWEL DISPOSABLE) ×2 IMPLANT
TRAY FAXITRON CT DISP (TRAY / TRAY PROCEDURE) IMPLANT
TUBE CONNECTING 20X1/4 (TUBING) ×2 IMPLANT
YANKAUER SUCT BULB TIP NO VENT (SUCTIONS) ×2 IMPLANT

## 2021-02-18 NOTE — Anesthesia Procedure Notes (Signed)
Anesthesia Regional Block: Pectoralis block   Pre-Anesthetic Checklist: , timeout performed,  Correct Patient, Correct Site, Correct Laterality,  Correct Procedure, Correct Position, site marked,  Risks and benefits discussed,  Surgical consent,  Pre-op evaluation,  At surgeon's request and post-op pain management  Laterality: Left  Prep: chloraprep       Needles:  Injection technique: Single-shot  Needle Type: Stimiplex     Needle Length: 9cm  Needle Gauge: 21     Additional Needles:   Procedures:,,,, ultrasound used (permanent image in chart),,    Narrative:  Start time: 02/18/2021 9:22 AM End time: 02/18/2021 9:27 AM Injection made incrementally with aspirations every 5 mL.  Performed by: Personally  Anesthesiologist: Lynda Rainwater, MD

## 2021-02-18 NOTE — Anesthesia Procedure Notes (Signed)
Procedure Name: LMA Insertion Date/Time: 02/18/2021 10:21 AM Performed by: Tawni Millers, CRNA Pre-anesthesia Checklist: Patient identified, Emergency Drugs available, Suction available and Patient being monitored Patient Re-evaluated:Patient Re-evaluated prior to induction Oxygen Delivery Method: Circle system utilized Preoxygenation: Pre-oxygenation with 100% oxygen Induction Type: IV induction Ventilation: Mask ventilation without difficulty LMA: LMA inserted LMA Size: 3.0 Number of attempts: 1 Airway Equipment and Method: Bite block Placement Confirmation: positive ETCO2 Tube secured with: Tape Dental Injury: Teeth and Oropharynx as per pre-operative assessment

## 2021-02-18 NOTE — Progress Notes (Signed)
Assisted Dr. Miller with left, ultrasound guided, pectoralis block. Side rails up, monitors on throughout procedure. See vital signs in flow sheet. Tolerated Procedure well. 

## 2021-02-18 NOTE — Discharge Instructions (Addendum)
[place] Office Phone Number [phone]   POST OP INSTRUCTIONS Take 400 mg of ibuprofen every 8 hours or 650 mg tylenol every 6 hours for next 72 hours then as needed. Use ice several times daily also. Always review your discharge instruction sheet given to you by the facility where your surgery was performed.  IF YOU HAVE DISABILITY OR FAMILY LEAVE FORMS, YOU MUST BRING THEM TO THE OFFICE FOR PROCESSING.  DO NOT GIVE THEM TO YOUR DOCTOR.  A prescription for pain medication may be given to you upon discharge.  Take your pain medication as prescribed, if needed.  If narcotic pain medicine is not needed, then you may take acetaminophen (Tylenol), naprosyn (Alleve) or ibuprofen (Advil) as needed. Take your usually prescribed medications unless otherwise directed If you need a refill on your pain medication, please contact your pharmacy.  They will contact our office to request authorization.  Prescriptions will not be filled after 5pm or on week-ends. You should eat very light the first 24 hours after surgery, such as soup, crackers, pudding, etc.  Resume your normal diet the day after surgery. Most patients will experience some swelling and bruising.  Ice packs  will help.  Swelling and bruising can take several days to resolve.  It is common to experience some constipation if taking pain medication after surgery.  Increasing fluid intake and taking a stool softener will usually help or prevent this problem from occurring.  A mild laxative (Milk of Magnesia or Miralax) should be taken according to package directions if there are no bowel movements after 48 hours. Unless discharge instructions indicate otherwise, you may remove your bandages 48 hours after surgery and you may shower at that time.  You may have steri-strips (small skin tapes) in place directly over the incision.  These strips should be left on the skin for 7-10 days and will come off on their own.  If your surgeon  used skin glue on the incision, you may shower in 24 hours.  The glue will flake off over the next 2-3 weeks.  Any sutures or staples will be removed at the office during your follow-up visit. ACTIVITIES:  You may resume regular daily activities (gradually increasing) beginning the next day.  Wearing a good support bra or sports bra minimizes pain and swelling.  You may have sexual intercourse when it is comfortable. You may drive when you no longer are taking prescription pain medication, you can comfortably wear a seatbelt, and you can safely maneuver your car and apply brakes. RETURN TO WORK:  ______________________________________________________________________________________ Dennis Bast should see your doctor in the office for a follow-up appointment approximately two weeks after your surgery.  Your doctor's nurse will typically make your follow-up appointment when she calls you with your pathology report.  Expect your pathology report 3-4 business days after your surgery.  You may call to check if you do not hear from [place] after three days. OTHER INSTRUCTIONS: _______________________________________________________________________________________________ _____________________________________________________________________________________________________________________________________ _____________________________________________________________________________________________________________________________________ _____________________________________________________________________________________________________________________________________  WHEN TO CALL DR WAKEFIELD: Fever over 101.0 Nausea and/or vomiting. Extreme swelling or bruising. Continued bleeding from incision. Increased pain, redness, or drainage from the incision.  The clinic staff is available to answer your questions during regular business hours.  Please don't hesitate to call and ask to speak to one of the nurses for clinical  concerns.  If you have a medical emergency, go to the nearest emergency room or call 911.  A surgeon from [facility] is always on call at the  hospital.  For further questions, please visit [email or web address]    Post Anesthesia Home Care Instructions  Activity: Get plenty of rest for the remainder of the day. A responsible individual must stay with you for 24 hours following the procedure.  For the next 24 hours, DO NOT: -Drive a car -Paediatric nurse -Drink alcoholic beverages -Take any medication unless instructed by your physician -Make any legal decisions or sign important papers.  Meals: Start with liquid foods such as gelatin or soup. Progress to regular foods as tolerated. Avoid greasy, spicy, heavy foods. If nausea and/or vomiting occur, drink only clear liquids until the nausea and/or vomiting subsides. Call your physician if vomiting continues.  Special Instructions/Symptoms: Your throat may feel dry or sore from the anesthesia or the breathing tube placed in your throat during surgery. If this causes discomfort, gargle with warm salt water. The discomfort should disappear within 24 hours.  May have Tylenol at 3pm today 02/18/21

## 2021-02-18 NOTE — Anesthesia Postprocedure Evaluation (Signed)
Anesthesia Post Note  Patient: [facility]  Procedure(s) Performed: RADIOACTIVE SEED GUIDED LEFT AXILLARY LYMPH NODE EXCISION (Left: Breast)     Patient location during evaluation: PACU Anesthesia Type: General Level of consciousness: awake and alert Pain management: pain level controlled Vital Signs Assessment: post-procedure vital signs reviewed and stable Respiratory status: spontaneous breathing, nonlabored ventilation and respiratory function stable Cardiovascular status: blood pressure returned to baseline and stable Postop Assessment: no apparent nausea or vomiting Anesthetic complications: no   No notable events documented.  Last Vitals:  Vitals:   02/18/21 1115 02/18/21 1200  BP: 134/75 (!) 146/81  Pulse: 85 75  Resp: 13 18  Temp:  36.6 C  SpO2: 96% 96%    Last Pain:  Vitals:   02/18/21 1135  TempSrc:   PainSc: 3                  Lynda Rainwater

## 2021-02-18 NOTE — Anesthesia Preprocedure Evaluation (Addendum)
Anesthesia Evaluation  Patient identified by MRN, date of birth, ID band Patient awake    Reviewed: Allergy & Precautions, H&P , NPO status , Patient's Chart, lab work & pertinent test results  History of Anesthesia Complications (+) PONV  Airway Mallampati: III  TM Distance: >3 FB Neck ROM: Full    Dental no notable dental hx. (+) Teeth Intact, Dental Advisory Given   Pulmonary neg pulmonary ROS, former smoker,    Pulmonary exam normal breath sounds clear to auscultation       Cardiovascular Exercise Tolerance: Good negative cardio ROS   Rhythm:Regular Rate:Normal     Neuro/Psych negative neurological ROS  negative psych ROS   GI/Hepatic negative GI ROS, Neg liver ROS,   Endo/Other  Hypothyroidism Morbid obesity  Renal/GU negative Renal ROS  negative genitourinary   Musculoskeletal  (+) Arthritis , Osteoarthritis,    Abdominal   Peds  Hematology negative hematology ROS (+)   Anesthesia Other Findings   Reproductive/Obstetrics negative OB ROS                             Anesthesia Physical  Anesthesia Plan  ASA: III  Anesthesia Plan: General   Post-op Pain Management:  Regional for Post-op pain   Induction: Intravenous  PONV Risk Score and Plan: 4 or greater and Ondansetron, Dexamethasone, Midazolam, Droperidol, TIVA and Treatment may vary due to age or medical condition  Airway Management Planned: LMA  Additional Equipment:   Intra-op Plan:   Post-operative Plan: Extubation in OR  Informed Consent: I have reviewed the patients History and Physical, chart, labs and discussed the procedure including the risks, benefits and alternatives for the proposed anesthesia with the patient or authorized representative who has indicated his/her understanding and acceptance.     Dental advisory given  Plan Discussed with: CRNA  Anesthesia Plan Comments:        Anesthesia  Quick Evaluation

## 2021-02-18 NOTE — Transfer of Care (Signed)
Immediate Anesthesia Transfer of Care Note  Patient: Elizabeth [facility]  Procedure(s) Performed: RADIOACTIVE SEED GUIDED LEFT AXILLARY LYMPH NODE EXCISION (Left: Breast)  Patient Location: PACU  Anesthesia Type:GA combined with regional for post-op pain  Level of Consciousness: sedated  Airway & Oxygen Therapy: Patient Spontanous Breathing and Patient connected to face mask oxygen  Post-op Assessment: Report given to RN and Post -op Vital signs reviewed and stable  Post vital signs: Reviewed and stable  Last Vitals:  Vitals Value Taken Time  BP 104/63 02/18/21 1048  Temp    Pulse 75 02/18/21 1049  Resp    SpO2 100 % 02/18/21 1049  Vitals shown include unvalidated device data.  Last Pain:  Vitals:   02/18/21 0857  TempSrc: Oral  PainSc: 0-No pain      Patients Stated Pain Goal: 4 ([id number])  Complications: No notable events documented.

## 2021-02-18 NOTE — Op Note (Signed)
Preoperative diagnosis: Generalized lymphadenopathy Postoperative diagnosis: Same as above Procedure: Left axillary radioactive seed guided lymph node biopsy Surgeon: Dr. Serita Grammes Anesthesia: General Estimated blood loss: Minimal Complications: None Specimens: Enlarged left axillary node with what appears to be several other smaller axillary lymph nodes, but this contains the seed. Drains: None Special count was correct completion Disposition to recovery stable condition  Indications:74 yof who has history of stage IA right breast cancer, er/pr pos in 2005 treated with lump/sn, no radiation, no chemo, did get letrozole for 5 years per her.  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  she has neg genetic testing.  she started anastrozole but stopped due to arthralgias.  she had mm last year with calcs and had negative biopsy.  she had chest ct for follow up for pulm nodules that were indeterminate but there were some left ax/subpec nodes noted.  from there she had a pet scan- generalized nodal enlargement with splenic enlargement as well.  she underwent a right inguinal node biopsy complicated by seroma that needed drainage and this was follicular hyperplasia and granulomas on pathology.  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 she has repeat chest ct that shows resolution of a lll nodule, unchanged anterior lul nodule, multiple scattered new nodules and interval enlargement of left axillary, subpec, celiac and rp nodes.  she had a biopsy of left ax node on 6/15 that is atypical lymphoid proliferation. Oncology and pulmonary both would like an excisional biopsy for diagnosis.  I discussed a left axillary node excision  Procedure: After informed consent was obtained the patient was taken to the operating room.  She was given antibiotics.  SCDs were in place.  She was placed under anesthesia without complication.  She was prepped and  draped in the standard sterile surgical fashion.  A surgical timeout was then performed.  I then identified the radioactive seed in the axilla.  I infiltrated some Marcaine and made a curvilinear incision in the axilla.  I carried this to the axillary fascia.  Identified the seed which was next to a very enlarged lymph node.  There were a couple other smaller lymph nodes and I excised these in total.  I clipped all the draining lymphatics that I could see.  I passed this off the table.  I confirmed by mammography that the seed had been removed.  I then obtained hemostasis.  I closed the axillary fascia with 2-0 Vicryl.  The skin was closed with 3-0 Vicryl for Monocryl.  Glue Steri-Strips were applied.  She tolerated this well was extubated and transferred to recovery in stable condition.

## 2021-02-18 NOTE — H&P (View-Only) (Signed)
Elizabeth Ramirez is an 75 y.o. female.   Chief Complaint: lymphadenopathy HPI: 58 yof who has history of stage IA right breast cancer, er/pr pos in 2005 treated with lump/sn, no radiation, no chemo, did get letrozole for 5 years per her.  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  she has neg genetic testing.  she started anastrozole but stopped due to arthralgias.  she had mm last year with calcs and had negative biopsy. no real breast complaints.  she had chest ct for follow up for pulm nodules that were indeterminate but there were some left ax/subpec nodes noted.  from there she had a pet scan- generalized nodal enlargement with splenic enlargement as well.  she underwent a right inguinal node biopsy complicated by seroma that needed drainage and this was follicular hyperplasia and granulomas on pathology.  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 she has repeat chest ct that shows resolution of a lll nodule, unchanged anterior lul nodule, multiple scattered new nodules and interval enlargement of left axillary, subpec, celiac and rp nodes.  she had a biopsy of left ax node on 6/15 that is atypical lymphoid proliferation. Oncology and pulmonary both would like an excisional biopsy for diagnosis. She has no changes since last visit.    Past Medical History:  Diagnosis Date   Allergy    tetracycline, levofloxacin, crabs & bay leaves   Arthritis    Rt knee, Rt shoulder   Breast cancer ([facility]) 2016; 2005   Separate primaries; 2016 - DCIS of R breast(surgery and radiation-last 04-04-15); ER/PR+ April 2005   Cancer [facility])    Ear infection 10/19/2013   Family history of breast cancer    Family history of multiple myeloma    Family history of ovarian cancer    Family history of pulmonary fibrosis    Family history of stomach cancer    Genetic testing 01/18/2015   Negative - no mutations found in any of 20 genes on the Breast/Ovarian Cancer  Panel.  Additionally, no variants of uncertain significance (VUSs) were found.  The Breast/Ovarian gene panel offered by [facility] Elizabeth Pigeon, MD) includes sequencing and deletion/duplication analysis for the following 19 genes:  ATM, BARD1, BRCA1, BRCA2, BRIP1, CDH1, CHEK2, FANCC, MLH1, MSH2, MSH6, NBN, PALB2, PMS2, PTEN, RAD51C, RAD51D, TP53, and XRCC2.  This panel also includes deletion/duplication analysis (without sequencing) for one gene, EPCAM.  Date of report is January 14, 2015.     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   Right proximal bridge of nose ([id number])   Seasonal allergies    Thyroid disease    Vitamin D deficiency     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   BREAST LUMPECTOMY Right 2016   BREAST LUMPECTOMY WITH NEEDLE LOCALIZATION Right 02/11/2015   Procedure: Geronimo Boot  BRACKETED RIGHT BREAST LUMPECTOMY ;  Surgeon: Excell Seltzer, MD;  Location: [facility];  Service: General;  Laterality: Right;   CATARACT EXTRACTION, BILATERAL  10-05-2017 left ; 11-09-2017 right   Dr Kandy Garrison    colonoscopy with biopsy  12/28/2003   neg   LYMPH NODE DISSECTION Right 02/23/2020   Procedure: right inguinal lymph node excision;  Surgeon: Alphonsa Overall, MD;  Location: [facility];  Service: General;  Laterality: Right;   REVERSE SHOULDER ARTHROPLASTY Left 07/14/2018    Procedure: REVERSE SHOULDER ARTHROPLASTY;  Surgeon: Justice Britain, MD;  Location: [facility];  Service: Orthopedics;  Laterality: Left;  Interscalene Block   TONSILLECTOMY     1954   TOTAL KNEE ARTHROPLASTY Right 05/27/2015   Procedure: RIGHT TOTAL KNEE ARTHROPLASTY;  Surgeon: Gaynelle Arabian, MD;  Location: [facility];  Service: Orthopedics;  Laterality: Right;   TOTAL KNEE ARTHROPLASTY Left 05/20/2020   Procedure: TOTAL KNEE ARTHROPLASTY;  Surgeon: Gaynelle Arabian, MD;  Location: [facility];  Service: Orthopedics;  Laterality: Left;  64mn   WISDOM TOOTH EXTRACTION Bilateral 1965    Family History  Problem Relation Age of Onset   Dementia Mother    Other Mother 865      "spot on lung"; secondhand smoke exposure   Pulmonary fibrosis Father    Diabetes Brother    Thyroid disease Brother    Leukemia Brother 768      chronic lymphocytic    Stomach cancer Maternal Grandmother        dx. 320s  Heart attack Maternal Grandfather    Diabetes Paternal Grandfather    Stroke Paternal Grandfather    Colitis Paternal Grandmother    Parkinson's disease Maternal Aunt    Diabetes Paternal Aunt    Other Paternal Aunt        bowel obstruction   Heart Problems Paternal Uncle    Ovarian cancer Maternal Aunt        dx. 554s paternal half sister of her mother   Multiple myeloma Maternal Aunt    Multiple myeloma Maternal Aunt 75   Kidney failure Paternal Aunt    Pulmonary fibrosis Paternal Aunt    Stroke Paternal Aunt    Heart Problems Paternal Aunt    Emphysema Paternal Uncle    Heart Problems Paternal Uncle    Lung cancer Paternal Uncle        smoker   Colon cancer Cousin    Breast cancer Cousin        dx. 53s  Pulmonary fibrosis Cousin    Social History:  reports that she quit smoking about 37 years ago. Her smoking use included cigarettes. She has a 9.00 pack-year smoking history. She has never used smokeless tobacco. She reports current alcohol use of about 2.0 standard drinks per week. She reports  that she does not use drugs.  Allergies:  Allergies  Allergen Reactions   Levofloxacin Other (See Comments)    Affected tendons, severe pain and swelling   Tetracyclines & Related Swelling    tongue swelling and peeling   Other Diarrhea, Rash and Other (See Comments)    Crabs - diarrhea. Bay leaves - rash in her mouth   Ciprofloxacin     Tendon problems    Flonase [Fluticasone]     Nasal spray cause nose bleeds   Ibuprofen     Gets mouth ulcers if she takes for more than 3 days; Aleve okay    Nitrofurantoin Monohyd Macro Hives  Pravastatin     Muscle aches, rash, constipation on 19m every other day dosing   Rosuvastatin     Muscle and joint pain on 175mdaily dosing   Scopolamine     "Headache and redness of eyes"    No medications prior to admission.    No results found for this or any previous visit (from the past 48 hour(s)). No results found.  Review of Systems  Constitutional:  Positive for fatigue.  All other systems reviewed and are negative.  Height 5' 4.5" (1.638 m), weight 87.1 kg. Physical Exam  General NAD pulm effort normal No breast mass No palpable axillary adenopathy Assessment/Plan Left axillary node seed guided excision I think with not being sure about what this is and in discussion with oncology and pulmonary that proceeding with left axillary node biopsy reasonable to give more tissue. I think will remove the node that was already biopsied with seed guidance. Discussed surgery, risks and recovery with her today and will schedule.   MaRolm BookbinderMD 02/18/2021, 7:21 AM

## 2021-02-18 NOTE — Interval H&P Note (Signed)
History and Physical Interval Note:  02/18/2021 9:55 AM  Elizabeth Ramirez  has presented today for surgery, with the diagnosis of LEFT AXILLARY ADENOPATHY.  The various methods of treatment have been discussed with the patient and family. After consideration of risks, benefits and other options for treatment, the patient has consented to  Procedure(s): RADIOACTIVE SEED GUIDED LEFT AXILLARY LYMPH NODE EXCISION (Left) as a surgical intervention.  The patient's history has been reviewed, patient examined, no change in status, stable for surgery.  I have reviewed the patient's chart and labs.  Questions were answered to the patient's satisfaction.     Rolm Bookbinder

## 2021-02-18 NOTE — H&P (Addendum)
Elizabeth Ramirez is an 75 y.o. female.   Chief Complaint: lymphadenopathy HPI: 41 yof who has history of stage IA right breast cancer, er/pr pos in 2005 treated with lump/sn, no radiation, no chemo, did get letrozole for 5 years per her.  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  she has neg genetic testing.  she started anastrozole but stopped due to arthralgias.  she had mm last year with calcs and had negative biopsy. no real breast complaints.  she had chest ct for follow up for pulm nodules that were indeterminate but there were some left ax/subpec nodes noted.  from there she had a pet scan- generalized nodal enlargement with splenic enlargement as well.  she underwent a right inguinal node biopsy complicated by seroma that needed drainage and this was follicular hyperplasia and granulomas on pathology.  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 she has repeat chest ct that shows resolution of a lll nodule, unchanged anterior lul nodule, multiple scattered new nodules and interval enlargement of left axillary, subpec, celiac and rp nodes.  she had a biopsy of left ax node on 6/15 that is atypical lymphoid proliferation. Oncology and pulmonary both would like an excisional biopsy for diagnosis. She has no changes since last visit.    Past Medical History:  Diagnosis Date   Allergy    tetracycline, levofloxacin, crabs & bay leaves   Arthritis    Rt knee, Rt shoulder   Breast cancer ([place]) 2016; 2005   Separate primaries; 2016 - DCIS of R breast(surgery and radiation-last 04-04-15); ER/PR+ April 2005   Cancer [facility])    Ear infection 10/19/2013   Family history of breast cancer    Family history of multiple myeloma    Family history of ovarian cancer    Family history of pulmonary fibrosis    Family history of stomach cancer    Genetic testing 01/18/2015   Negative - no mutations found in any of 20 genes on the Breast/Ovarian Cancer  Panel.  Additionally, no variants of uncertain significance (VUSs) were found.  The Breast/Ovarian gene panel offered by [facility] Elizabeth Pigeon, MD) includes sequencing and deletion/duplication analysis for the following 19 genes:  ATM, BARD1, BRCA1, BRCA2, BRIP1, CDH1, CHEK2, FANCC, MLH1, MSH2, MSH6, NBN, PALB2, PMS2, PTEN, RAD51C, RAD51D, TP53, and XRCC2.  This panel also includes deletion/duplication analysis (without sequencing) for one gene, EPCAM.  Date of report is January 14, 2015.     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   Right proximal bridge of nose (CX35FU)   Seasonal allergies    Thyroid disease    Vitamin D deficiency     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   BREAST LUMPECTOMY Right 2016   BREAST LUMPECTOMY WITH NEEDLE LOCALIZATION Right 02/11/2015   Procedure: Geronimo Boot  BRACKETED RIGHT BREAST LUMPECTOMY ;  Surgeon: Excell Seltzer, MD;  Location: [place];  Service: General;  Laterality: Right;   CATARACT EXTRACTION, BILATERAL  10-05-2017 left ; 11-09-2017 right   Dr Kandy Garrison    colonoscopy with biopsy  12/28/2003   neg   LYMPH NODE DISSECTION Right 02/23/2020   Procedure: right inguinal lymph node excision;  Surgeon: Alphonsa Overall, MD;  Location: [place];  Service: General;  Laterality: Right;   REVERSE SHOULDER ARTHROPLASTY Left 07/14/2018    Procedure: REVERSE SHOULDER ARTHROPLASTY;  Surgeon: Justice Britain, MD;  Location: [facility];  Service: Orthopedics;  Laterality: Left;  Interscalene Block   TONSILLECTOMY     1954   TOTAL KNEE ARTHROPLASTY Right 05/27/2015   Procedure: RIGHT TOTAL KNEE ARTHROPLASTY;  Surgeon: Gaynelle Arabian, MD;  Location: [facility];  Service: Orthopedics;  Laterality: Right;   TOTAL KNEE ARTHROPLASTY Left 05/20/2020   Procedure: TOTAL KNEE ARTHROPLASTY;  Surgeon: Gaynelle Arabian, MD;  Location: [facility];  Service: Orthopedics;  Laterality: Left;  89mn   WISDOM TOOTH EXTRACTION Bilateral 1965    Family History  Problem Relation Age of Onset   Dementia Mother    Other Mother 858      "spot on lung"; secondhand smoke exposure   Pulmonary fibrosis Father    Diabetes Brother    Thyroid disease Brother    Leukemia Brother 777      chronic lymphocytic    Stomach cancer Maternal Grandmother        dx. 334s  Heart attack Maternal Grandfather    Diabetes Paternal Grandfather    Stroke Paternal Grandfather    Colitis Paternal Grandmother    Parkinson's disease Maternal Aunt    Diabetes Paternal Aunt    Other Paternal Aunt        bowel obstruction   Heart Problems Paternal Uncle    Ovarian cancer Maternal Aunt        dx. 547s paternal half sister of her mother   Multiple myeloma Maternal Aunt    Multiple myeloma Maternal Aunt 75   Kidney failure Paternal Aunt    Pulmonary fibrosis Paternal Aunt    Stroke Paternal Aunt    Heart Problems Paternal Aunt    Emphysema Paternal Uncle    Heart Problems Paternal Uncle    Lung cancer Paternal Uncle        smoker   Colon cancer Cousin    Breast cancer Cousin        dx. 538s  Pulmonary fibrosis Cousin    Social History:  reports that she quit smoking about 37 years ago. Her smoking use included cigarettes. She has a 9.00 pack-year smoking history. She has never used smokeless tobacco. She reports current alcohol use of about 2.0 standard drinks per week. She reports  that she does not use drugs.  Allergies:  Allergies  Allergen Reactions   Levofloxacin Other (See Comments)    Affected tendons, severe pain and swelling   Tetracyclines & Related Swelling    tongue swelling and peeling   Other Diarrhea, Rash and Other (See Comments)    Crabs - diarrhea. Bay leaves - rash in her mouth   Ciprofloxacin     Tendon problems    Flonase [Fluticasone]     Nasal spray cause nose bleeds   Ibuprofen     Gets mouth ulcers if she takes for more than 3 days; Aleve okay    Nitrofurantoin Monohyd Macro Hives  Pravastatin     Muscle aches, rash, constipation on 53m every other day dosing   Rosuvastatin     Muscle and joint pain on 146mdaily dosing   Scopolamine     "Headache and redness of eyes"    No medications prior to admission.    No results found for this or any previous visit (from the past 48 hour(s)). No results found.  Review of Systems  Constitutional:  Positive for fatigue.  All other systems reviewed and are negative.  Height 5' 4.5" (1.638 m), weight 87.1 kg. Physical Exam  General NAD pulm effort normal No breast mass No palpable axillary adenopathy Assessment/Plan Left axillary node seed guided excision I think with not being sure about what this is and in discussion with oncology and pulmonary that proceeding with left axillary node biopsy reasonable to give more tissue. I think will remove the node that was already biopsied with seed guidance. Discussed surgery, risks and recovery with her today and will schedule.   MaRolm BookbinderMD 02/18/2021, 7:21 AM

## 2021-02-20 ENCOUNTER — Encounter (HOSPITAL_BASED_OUTPATIENT_CLINIC_OR_DEPARTMENT_OTHER): Payer: Self-pay | Admitting: General Surgery

## 2021-02-21 ENCOUNTER — Encounter: Payer: Self-pay | Admitting: Oncology

## 2021-02-21 LAB — SURGICAL PATHOLOGY

## 2021-02-25 ENCOUNTER — Inpatient Hospital Stay: Payer: Medicare Other | Attending: Oncology

## 2021-02-25 ENCOUNTER — Other Ambulatory Visit: Payer: Self-pay

## 2021-02-25 DIAGNOSIS — Z923 Personal history of irradiation: Secondary | ICD-10-CM | POA: Diagnosis not present

## 2021-02-25 DIAGNOSIS — R591 Generalized enlarged lymph nodes: Secondary | ICD-10-CM | POA: Insufficient documentation

## 2021-02-25 DIAGNOSIS — Z86000 Personal history of in-situ neoplasm of breast: Secondary | ICD-10-CM | POA: Insufficient documentation

## 2021-02-25 DIAGNOSIS — D479 Neoplasm of uncertain behavior of lymphoid, hematopoietic and related tissue, unspecified: Secondary | ICD-10-CM

## 2021-02-25 DIAGNOSIS — E039 Hypothyroidism, unspecified: Secondary | ICD-10-CM | POA: Insufficient documentation

## 2021-02-25 DIAGNOSIS — Z79899 Other long term (current) drug therapy: Secondary | ICD-10-CM | POA: Diagnosis not present

## 2021-02-25 DIAGNOSIS — Z87891 Personal history of nicotine dependence: Secondary | ICD-10-CM | POA: Diagnosis not present

## 2021-02-25 LAB — COMPREHENSIVE METABOLIC PANEL
ALT: 15 U/L (ref 0–44)
AST: 15 U/L (ref 15–41)
Albumin: 3.9 g/dL (ref 3.5–5.0)
Alkaline Phosphatase: 83 U/L (ref 38–126)
Anion gap: 10 (ref 5–15)
BUN: 17 mg/dL (ref 8–23)
CO2: 26 mmol/L (ref 22–32)
Calcium: 10.2 mg/dL (ref 8.9–10.3)
Chloride: 106 mmol/L (ref 98–111)
Creatinine, Ser: 1.17 mg/dL — ABNORMAL HIGH (ref 0.44–1.00)
GFR, Estimated: 48 mL/min — ABNORMAL LOW (ref >60)
Glucose, Bld: 98 mg/dL (ref 70–99)
Potassium: 4.3 mmol/L (ref 3.5–5.1)
Sodium: 142 mmol/L (ref 135–145)
Total Bilirubin: 1.1 mg/dL (ref 0.3–1.2)
Total Protein: 6.7 g/dL (ref 6.5–8.1)

## 2021-03-05 ENCOUNTER — Other Ambulatory Visit: Payer: Self-pay | Admitting: General Surgery

## 2021-03-06 ENCOUNTER — Encounter (HOSPITAL_BASED_OUTPATIENT_CLINIC_OR_DEPARTMENT_OTHER): Payer: Self-pay | Admitting: General Surgery

## 2021-03-07 NOTE — Progress Notes (Signed)

## 2021-03-11 ENCOUNTER — Other Ambulatory Visit: Payer: Self-pay

## 2021-03-11 ENCOUNTER — Ambulatory Visit (HOSPITAL_BASED_OUTPATIENT_CLINIC_OR_DEPARTMENT_OTHER)
Admission: RE | Admit: 2021-03-11 | Discharge: 2021-03-11 | Disposition: A | Discharge status: Home / Self Care | Payer: Medicare Other | Source: Ambulatory Visit | Attending: General Surgery | Admitting: General Surgery

## 2021-03-11 ENCOUNTER — Encounter: Payer: Self-pay | Admitting: Oncology

## 2021-03-11 ENCOUNTER — Ambulatory Visit (HOSPITAL_BASED_OUTPATIENT_CLINIC_OR_DEPARTMENT_OTHER): Payer: Medicare Other | Admitting: Anesthesiology

## 2021-03-11 ENCOUNTER — Encounter (HOSPITAL_BASED_OUTPATIENT_CLINIC_OR_DEPARTMENT_OTHER)
Admission: RE | Disposition: A | Discharge status: Home / Self Care | Payer: Self-pay | Source: Ambulatory Visit | Attending: General Surgery

## 2021-03-11 ENCOUNTER — Encounter (HOSPITAL_BASED_OUTPATIENT_CLINIC_OR_DEPARTMENT_OTHER): Payer: Self-pay | Admitting: General Surgery

## 2021-03-11 DIAGNOSIS — Z886 Allergy status to analgesic agent status: Secondary | ICD-10-CM | POA: Insufficient documentation

## 2021-03-11 DIAGNOSIS — E559 Vitamin D deficiency, unspecified: Secondary | ICD-10-CM | POA: Diagnosis not present

## 2021-03-11 DIAGNOSIS — Z853 Personal history of malignant neoplasm of breast: Secondary | ICD-10-CM | POA: Insufficient documentation

## 2021-03-11 DIAGNOSIS — Z803 Family history of malignant neoplasm of breast: Secondary | ICD-10-CM | POA: Diagnosis not present

## 2021-03-11 DIAGNOSIS — Y848 Other medical procedures as the cause of abnormal reaction of the patient, or of later complication, without mention of misadventure at the time of the procedure: Secondary | ICD-10-CM | POA: Diagnosis not present

## 2021-03-11 DIAGNOSIS — Z91013 Allergy to seafood: Secondary | ICD-10-CM | POA: Insufficient documentation

## 2021-03-11 DIAGNOSIS — L7634 Postprocedural seroma of skin and subcutaneous tissue following other procedure: Secondary | ICD-10-CM | POA: Insufficient documentation

## 2021-03-11 DIAGNOSIS — Z87891 Personal history of nicotine dependence: Secondary | ICD-10-CM | POA: Diagnosis not present

## 2021-03-11 DIAGNOSIS — Z881 Allergy status to other antibiotic agents status: Secondary | ICD-10-CM | POA: Insufficient documentation

## 2021-03-11 DIAGNOSIS — R161 Splenomegaly, not elsewhere classified: Secondary | ICD-10-CM | POA: Diagnosis not present

## 2021-03-11 DIAGNOSIS — J302 Other seasonal allergic rhinitis: Secondary | ICD-10-CM | POA: Diagnosis not present

## 2021-03-11 DIAGNOSIS — E039 Hypothyroidism, unspecified: Secondary | ICD-10-CM | POA: Diagnosis not present

## 2021-03-11 HISTORY — PX: EVACUATION BREAST HEMATOMA: SHX1537

## 2021-03-11 SURGERY — EVACUATION, HEMATOMA, BREAST
Anesthesia: General | Site: Breast | Laterality: Left

## 2021-03-11 MED ORDER — LIDOCAINE 2% (20 MG/ML) 5 ML SYRINGE
INTRAMUSCULAR | Status: DC | PRN
  Administered 2021-03-11: 60 mg via INTRAVENOUS

## 2021-03-11 MED ORDER — FENTANYL CITRATE (PF) 100 MCG/2ML IJ SOLN
INTRAMUSCULAR | Status: AC
  Filled 2021-03-11: qty 2

## 2021-03-11 MED ORDER — FENTANYL CITRATE (PF) 100 MCG/2ML IJ SOLN
INTRAMUSCULAR | Status: DC | PRN
  Administered 2021-03-11 (×2): 50 ug via INTRAVENOUS

## 2021-03-11 MED ORDER — PROPOFOL 10 MG/ML IV BOLUS
INTRAVENOUS | Status: DC | PRN
  Administered 2021-03-11: 150 mg via INTRAVENOUS

## 2021-03-11 MED ORDER — PROPOFOL 10 MG/ML IV BOLUS
INTRAVENOUS | Status: AC
  Filled 2021-03-11: qty 20

## 2021-03-11 MED ORDER — ACETAMINOPHEN 500 MG PO TABS
1000.0000 mg | ORAL_TABLET | Freq: Once | ORAL | Status: AC
  Administered 2021-03-11: 1000 mg via ORAL

## 2021-03-11 MED ORDER — BUPIVACAINE HCL (PF) 0.25 % IJ SOLN
INTRAMUSCULAR | Status: DC | PRN
  Administered 2021-03-11: 5 mL

## 2021-03-11 MED ORDER — PHENYLEPHRINE 40 MCG/ML (10ML) SYRINGE FOR IV PUSH (FOR BLOOD PRESSURE SUPPORT)
PREFILLED_SYRINGE | INTRAVENOUS | Status: AC
  Filled 2021-03-11: qty 10

## 2021-03-11 MED ORDER — PHENYLEPHRINE HCL (PRESSORS) 10 MG/ML IV SOLN
INTRAVENOUS | Status: DC | PRN
  Administered 2021-03-11: 120 ug via INTRAVENOUS

## 2021-03-11 MED ORDER — CEFAZOLIN SODIUM-DEXTROSE 2-4 GM/100ML-% IV SOLN
INTRAVENOUS | Status: AC
  Filled 2021-03-11: qty 100

## 2021-03-11 MED ORDER — DEXAMETHASONE SODIUM PHOSPHATE 4 MG/ML IJ SOLN
INTRAMUSCULAR | Status: DC | PRN
  Administered 2021-03-11: 8 mg via INTRAVENOUS

## 2021-03-11 MED ORDER — ACETAMINOPHEN 500 MG PO TABS: 1000.0000 mg | ORAL_TABLET | ORAL | Status: AC

## 2021-03-11 MED ORDER — ONDANSETRON HCL 4 MG/2ML IJ SOLN
INTRAMUSCULAR | Status: DC | PRN
  Administered 2021-03-11: 4 mg via INTRAVENOUS

## 2021-03-11 MED ORDER — FENTANYL CITRATE (PF) 100 MCG/2ML IJ SOLN: 25.0000 ug | INTRAMUSCULAR | Status: DC | PRN

## 2021-03-11 MED ORDER — ACETAMINOPHEN 500 MG PO TABS
ORAL_TABLET | ORAL | Status: AC
  Filled 2021-03-11: qty 2

## 2021-03-11 MED ORDER — CEFAZOLIN SODIUM-DEXTROSE 2-4 GM/100ML-% IV SOLN
2.0000 g | INTRAVENOUS | Status: AC
  Administered 2021-03-11: 2 g via INTRAVENOUS

## 2021-03-11 MED ORDER — PROPOFOL 500 MG/50ML IV EMUL
INTRAVENOUS | Status: AC
  Filled 2021-03-11: qty 50

## 2021-03-11 MED ORDER — CHLORHEXIDINE GLUCONATE CLOTH 2 % EX PADS: 6.0000 | MEDICATED_PAD | Freq: Once | CUTANEOUS | Status: DC

## 2021-03-11 MED ORDER — CHLORHEXIDINE GLUCONATE CLOTH 2 % EX PADS
6.0000 | MEDICATED_PAD | Freq: Once | CUTANEOUS | Status: DC
Start: 2021-03-11 — End: 2021-03-11

## 2021-03-11 MED ORDER — LACTATED RINGERS IV SOLN: INTRAVENOUS | Status: DC

## 2021-03-11 MED ORDER — PROPOFOL 500 MG/50ML IV EMUL
INTRAVENOUS | Status: DC | PRN
  Administered 2021-03-11: 120 ug/kg/min via INTRAVENOUS

## 2021-03-11 SURGICAL SUPPLY — 60 items
ADH SKN CLS APL DERMABOND .7 (GAUZE/BANDAGES/DRESSINGS)
APL PRP STRL LF DISP 70% ISPRP (MISCELLANEOUS) ×1
APL SKNCLS STERI-STRIP NONHPOA (GAUZE/BANDAGES/DRESSINGS) ×1
APPLIER CLIP 9.375 MED OPEN (MISCELLANEOUS)
APR CLP MED 9.3 20 MLT OPN (MISCELLANEOUS)
BENZOIN TINCTURE PRP APPL 2/3 (GAUZE/BANDAGES/DRESSINGS) ×2 IMPLANT
BIOPATCH RED 1 DISK 7.0 (GAUZE/BANDAGES/DRESSINGS) ×2 IMPLANT
BLADE SURG 11 STRL SS (BLADE) ×2 IMPLANT
BLADE SURG 15 STRL LF DISP TIS (BLADE) IMPLANT
BLADE SURG 15 STRL SS (BLADE)
BNDG COHESIVE 4X5 TAN ST LF (GAUZE/BANDAGES/DRESSINGS) IMPLANT
CANISTER SUCT 1200ML W/VALVE (MISCELLANEOUS) IMPLANT
CHLORAPREP W/TINT 26 (MISCELLANEOUS) ×2 IMPLANT
CLIP APPLIE 9.375 MED OPEN (MISCELLANEOUS) IMPLANT
CLIP TI WIDE RED SMALL 6 (CLIP) IMPLANT
COVER BACK TABLE 60X90IN (DRAPES) ×2 IMPLANT
COVER MAYO STAND STRL (DRAPES) ×2 IMPLANT
COVER PROBE W GEL 5X96 (DRAPES) IMPLANT
DECANTER SPIKE VIAL GLASS SM (MISCELLANEOUS) IMPLANT
DERMABOND ADVANCED (GAUZE/BANDAGES/DRESSINGS)
DERMABOND ADVANCED .7 DNX12 (GAUZE/BANDAGES/DRESSINGS) IMPLANT
DRAIN CHANNEL 19F RND (DRAIN) ×2 IMPLANT
DRAPE LAPAROSCOPIC ABDOMINAL (DRAPES) ×2 IMPLANT
DRAPE U-SHAPE 76X120 STRL (DRAPES) IMPLANT
DRAPE UTILITY XL STRL (DRAPES) ×2 IMPLANT
DRSG TEGADERM 2-3/8X2-3/4 SM (GAUZE/BANDAGES/DRESSINGS) ×2 IMPLANT
DRSG TEGADERM 4X4.75 (GAUZE/BANDAGES/DRESSINGS) ×4 IMPLANT
ELECT COATED BLADE 2.86 ST (ELECTRODE) ×2 IMPLANT
ELECT REM PT RETURN 9FT ADLT (ELECTROSURGICAL) ×2
ELECTRODE REM PT RTRN 9FT ADLT (ELECTROSURGICAL) ×1 IMPLANT
EVACUATOR SILICONE 100CC (DRAIN) ×2 IMPLANT
GAUZE SPONGE 4X4 12PLY STRL LF (GAUZE/BANDAGES/DRESSINGS) ×2 IMPLANT
GLOVE SURG ENC MOIS LTX SZ7 (GLOVE) ×2 IMPLANT
GLOVE SURG POLYISO LF SZ6.5 (GLOVE) ×2 IMPLANT
GLOVE SURG UNDER POLY LF SZ6.5 (GLOVE) ×2 IMPLANT
GLOVE SURG UNDER POLY LF SZ7 (GLOVE) ×2 IMPLANT
GLOVE SURG UNDER POLY LF SZ7.5 (GLOVE) ×2 IMPLANT
GOWN STRL REUS W/ TWL LRG LVL3 (GOWN DISPOSABLE) ×2 IMPLANT
GOWN STRL REUS W/TWL LRG LVL3 (GOWN DISPOSABLE) ×4
HEMOSTAT ARISTA ABSORB 3G PWDR (HEMOSTASIS) IMPLANT
NEEDLE HYPO 25X1 1.5 SAFETY (NEEDLE) ×2 IMPLANT
NS IRRIG 1000ML POUR BTL (IV SOLUTION) ×2 IMPLANT
PACK BASIN DAY SURGERY FS (CUSTOM PROCEDURE TRAY) ×2 IMPLANT
PENCIL SMOKE EVACUATOR (MISCELLANEOUS) ×2 IMPLANT
RETRACTOR ONETRAX LX 90X20 (MISCELLANEOUS) IMPLANT
SLEEVE SCD COMPRESS KNEE MED (STOCKING) ×2 IMPLANT
SPONGE T-LAP 4X18 ~~LOC~~+RFID (SPONGE) ×2 IMPLANT
STRIP CLOSURE SKIN 1/2X4 (GAUZE/BANDAGES/DRESSINGS) ×2 IMPLANT
SUT ETHILON 2 0 FS 18 (SUTURE) ×6 IMPLANT
SUT MNCRL AB 4-0 PS2 18 (SUTURE) IMPLANT
SUT SILK 2 0 SH (SUTURE) IMPLANT
SUT VIC AB 2-0 SH 27 (SUTURE)
SUT VIC AB 2-0 SH 27XBRD (SUTURE) IMPLANT
SUT VIC AB 3-0 SH 27 (SUTURE)
SUT VIC AB 3-0 SH 27X BRD (SUTURE) IMPLANT
SUT VICRYL AB 3 0 TIES (SUTURE) IMPLANT
SYR CONTROL 10ML LL (SYRINGE) ×2 IMPLANT
TOWEL GREEN STERILE FF (TOWEL DISPOSABLE) ×2 IMPLANT
TUBE CONNECTING 20X1/4 (TUBING) ×2 IMPLANT
YANKAUER SUCT BULB TIP NO VENT (SUCTIONS) ×2 IMPLANT

## 2021-03-11 NOTE — Transfer of Care (Signed)
Immediate Anesthesia Transfer of Care Note  Patient: ANEVAY CAMPANELLA  Procedure(s) Performed: LEFT AXILLARY SEROMA DRAINAGE AND DRAIN PLACEMENT (Left: Breast)  Patient Location: PACU  Anesthesia Type:General  Level of Consciousness: drowsy  Airway & Oxygen Therapy: Patient Spontanous Breathing and Patient connected to face mask oxygen  Post-op Assessment: Report given to RN and Post -op Vital signs reviewed and stable  Post vital signs: Reviewed and stable  Last Vitals:  Vitals Value Taken Time  BP 110/80   Temp    Pulse 77   Resp    SpO2 100     Last Pain:  Vitals:   03/11/21 1254  TempSrc: Oral  PainSc: 0-No pain      Patients Stated Pain Goal: 3 (38/18/29 9371)  Complications: No notable events documented.

## 2021-03-11 NOTE — Op Note (Signed)
Preoperative diagnosis: Persistent left axillary seroma status post lymph node excision Postoperative diagnosis: Same as above Procedure: Left axillary seroma drainage with drain placement Surgeon: Dr. Serita Grammes Anesthesia: General Estimated blood loss: Minimal Complications: None Drains: 19 French Blake drain to left axilla Specimens: None Special count was correct completion Decision recovery stable condition  Indications: This is a 75 year old female who had persistent left axillary adenopathy among other areas that requested a lymph node biopsy both by oncology and pulmonology.  I did this recently.  I did not place a drain at the time of surgery.  Her pathology is benign.  She has had a persistent left axillary seroma that keeps happening after drainage and I discussed going to the operating room and placing a drain.  Procedure: After informed consent was obtained the patient was taken to the operating room.  She was given antibiotics.  SCDs were in place.  She was placed under general anesthesia without complication.  She was prepped and draped in the standard sterile surgical fashion.  A surgical timeout was then performed.  I infiltrated lidocaine below the prior incision.  I then made a stab incision below her axillary incision.  I then used a tonsil to enter into the seroma cavity of which there was a fair amount of clear fluid.  Once I had drained this adequately I then placed a 19 [place] Blake drain in this cavity.  I secured this with two 2-0 nylon sutures.  Biopatch and a Tegaderm were placed.  She tolerated this well and was transferred to recovery room stable.

## 2021-03-11 NOTE — Discharge Instructions (Addendum)
JP Drain [employer] this sheet to all of your post-operative appointments while you have your drains. Please measure your drains by CC's or ML's. Make sure you drain and measure your JP Drains 3 times per day. At the end of each day, add up totals for the left side and add up totals for the right side.    ( 9 am )     ( 3 pm )        ( 9 pm )                Date L  R  L  R  L  R  Total L/R                                                                                                                                                                                        [facility] [employer] Office Phone Number [phone]   POST OP INSTRUCTIONS Take 400 mg of ibuprofen every 8 hours or 650 mg tylenol every 6 hours for next 72 hours then as needed. Use ice several times daily also. Always review your discharge instruction sheet given to you by the facility where your surgery was performed.  IF YOU HAVE DISABILITY OR FAMILY LEAVE FORMS, YOU MUST BRING THEM TO THE OFFICE FOR PROCESSING.  DO NOT GIVE THEM TO YOUR DOCTOR.  A prescription for pain medication may be given to you upon discharge.  Take your pain medication as prescribed, if needed.  If narcotic pain medicine is not needed, then you may take acetaminophen (Tylenol), naprosyn (Alleve) or ibuprofen (Advil) as needed. Take your usually prescribed medications unless otherwise directed If you need a refill on your pain medication, please contact your pharmacy.  They will contact our office to request authorization.  Prescriptions will not be filled after 5pm or on week-ends. You should eat very light the first 24 hours after surgery, such as soup, crackers, pudding, etc.  Resume your normal diet the day after surgery. Most patients will experience some swelling and bruising  Ice packs will help. Swelling and bruising can take several days to resolve.  It is common to experience some constipation if taking pain  medication after surgery.  Increasing fluid intake and taking a stool softener will usually help or prevent this problem from occurring.  A mild laxative (Milk of Magnesia or Miralax) should be taken according to package directions if there are no bowel movements after 48 hours. Unless discharge instructions indicate otherwise, you may remove your bandages 48 hours after surgery and you may shower at that time.  You may have steri-strips (small skin tapes)  in place directly over the incision.  These strips should be left on the skin for 7-10 days and will come off on their own.  If your surgeon used skin glue on the incision, you may shower in 24 hours.  The glue will flake off over the next 2-3 weeks.  Any sutures or staples will be removed at the office during your follow-up visit. ACTIVITIES:  You may resume regular daily activities (gradually increasing) beginning the next day.  Wearing a good support bra or sports bra minimizes pain and swelling.  You may have sexual intercourse when it is comfortable. You may drive when you no longer are taking prescription pain medication, you can comfortably wear a seatbelt, and you can safely maneuver your car and apply brakes. RETURN TO WORK:  ______________________________________________________________________________________ Dennis Bast should see your doctor in the office for a follow-up appointment approximately two weeks after your surgery.  Your doctor's nurse will typically make your follow-up appointment when she calls you with your pathology report.  Expect your pathology report 3-4 business days after your surgery.  You may call to check if you do not hear from [place] after three days. OTHER INSTRUCTIONS: _______________________________________________________________________________________________  _____________________________________________________________________________________________________________________________________ _____________________________________________________________________________________________________________________________________ _____________________________________________________________________________________________________________________________________  WHEN TO CALL DR WAKEFIELD: Fever over 101.0 Nausea and/or vomiting. Extreme swelling or bruising. Continued bleeding from incision. Increased pain, redness, or drainage from the incision.  The clinic staff is available to answer your questions during regular business hours.  Please don't hesitate to call and ask to speak to one of the nurses for clinical concerns.  If you have a medical emergency, go to the nearest emergency room or call 911.  A surgeon from [facility] is always on call at the hospital.  For further questions, please visit [email or web address]    Post Anesthesia Home Care Instructions  Activity: Get plenty of rest for the remainder of the day. A responsible individual must stay with you for 24 hours following the procedure.  For the next 24 hours, DO NOT: -Drive a car -Paediatric nurse -Drink alcoholic beverages -Take any medication unless instructed by your physician -Make any legal decisions or sign important papers.  Meals: Start with liquid foods such as gelatin or soup. Progress to regular foods as tolerated. Avoid greasy, spicy, heavy foods. If nausea and/or vomiting occur, drink only clear liquids until the nausea and/or vomiting subsides. Call your physician if vomiting continues.  Special Instructions/Symptoms: Your throat may feel dry or sore from the anesthesia or the breathing tube placed in your throat during surgery. If this causes discomfort, gargle with warm salt water. The discomfort should disappear within 24 hours.  Next dose  of Tylenol can be given at 7:00pm if needed.

## 2021-03-11 NOTE — Interval H&P Note (Signed)
History and Physical Interval Note:  03/11/2021 1:44 PM Plan for drain placement today [name]  has presented today for surgery, with the diagnosis of AXILLARY SEROMA.  The various methods of treatment have been discussed with the patient and family. After consideration of risks, benefits and other options for treatment, the patient has consented to  Procedure(s): LEFT AXILLARY SEROMA DRAINAGE AND DRAIN PLACEMENT (Left) as a surgical intervention.  The patient's history has been reviewed, patient examined, no change in status, stable for surgery.  I have reviewed the patient's chart and labs.  Questions were answered to the patient's satisfaction.     Rolm Bookbinder

## 2021-03-11 NOTE — Anesthesia Preprocedure Evaluation (Addendum)
Anesthesia Evaluation  Patient identified by MRN, date of birth, ID band Patient awake    Reviewed: Allergy & Precautions, NPO status , Patient's Chart, lab work & pertinent test results  History of Anesthesia Complications (+) PONV  Airway Mallampati: III  TM Distance: >3 FB Neck ROM: Full    Dental no notable dental hx. (+) Teeth Intact, Dental Advisory Given   Pulmonary neg pulmonary ROS, former smoker,    Pulmonary exam normal breath sounds clear to auscultation       Cardiovascular + CAD  Normal cardiovascular exam Rhythm:Regular Rate:Normal     Neuro/Psych negative neurological ROS  negative psych ROS   GI/Hepatic negative GI ROS, Neg liver ROS,   Endo/Other  Hypothyroidism Obese BMI 33  Renal/GU negative Renal ROS  negative genitourinary   Musculoskeletal  (+) Arthritis ,   Abdominal   Peds  Hematology negative hematology ROS (+)   Anesthesia Other Findings   Reproductive/Obstetrics                            Anesthesia Physical Anesthesia Plan  ASA: 3  Anesthesia Plan: General   Post-op Pain Management:    Induction: Intravenous  PONV Risk Score and Plan: Ondansetron, Dexamethasone, TIVA and Treatment may vary due to age or medical condition  Airway Management Planned: LMA  Additional Equipment:   Intra-op Plan:   Post-operative Plan: Extubation in OR  Informed Consent: I have reviewed the patients History and Physical, chart, labs and discussed the procedure including the risks, benefits and alternatives for the proposed anesthesia with the patient or authorized representative who has indicated his/her understanding and acceptance.     Dental advisory given  Plan Discussed with: CRNA  Anesthesia Plan Comments:         Anesthesia Quick Evaluation

## 2021-03-11 NOTE — Anesthesia Postprocedure Evaluation (Signed)
Anesthesia Post Note  Patient: Elizabeth Ramirez  Procedure(s) Performed: LEFT AXILLARY SEROMA DRAINAGE AND DRAIN PLACEMENT (Left: Breast)     Patient location during evaluation: PACU Anesthesia Type: General Level of consciousness: awake and alert Pain management: pain level controlled Vital Signs Assessment: post-procedure vital signs reviewed and stable Respiratory status: spontaneous breathing, nonlabored ventilation, respiratory function stable and patient connected to nasal cannula oxygen Cardiovascular status: blood pressure returned to baseline and stable Postop Assessment: no apparent nausea or vomiting Anesthetic complications: no   No notable events documented.  Last Vitals:  Vitals:   03/11/21 1445 03/11/21 1500  BP: 118/63 107/65  Pulse: 73 70  Resp: 15 17  Temp:    SpO2: 100% 95%    Last Pain:  Vitals:   03/11/21 1500  TempSrc:   PainSc: 0-No pain                 Kavon Valenza L Kaylon Hitz

## 2021-03-11 NOTE — Anesthesia Procedure Notes (Signed)
Procedure Name: LMA Insertion Date/Time: 03/11/2021 2:09 PM Performed by: Ezequiel Kayser, CRNA Pre-anesthesia Checklist: Patient identified, Emergency Drugs available, Suction available and Patient being monitored Patient Re-evaluated:Patient Re-evaluated prior to induction Oxygen Delivery Method: Circle System Utilized Preoxygenation: Pre-oxygenation with 100% oxygen Induction Type: IV induction Ventilation: Mask ventilation without difficulty LMA: LMA inserted LMA Size: 4.0 Number of attempts: 1 Airway Equipment and Method: Bite block Placement Confirmation: positive ETCO2 Tube secured with: Tape Dental Injury: Teeth and Oropharynx as per pre-operative assessment

## 2021-03-12 ENCOUNTER — Encounter: Payer: Self-pay | Admitting: Oncology

## 2021-03-13 ENCOUNTER — Inpatient Hospital Stay (HOSPITAL_BASED_OUTPATIENT_CLINIC_OR_DEPARTMENT_OTHER): Payer: Medicare Other | Admitting: Oncology

## 2021-03-13 ENCOUNTER — Encounter: Payer: Self-pay | Admitting: Oncology

## 2021-03-13 ENCOUNTER — Encounter (HOSPITAL_BASED_OUTPATIENT_CLINIC_OR_DEPARTMENT_OTHER): Payer: Self-pay | Admitting: General Surgery

## 2021-03-13 DIAGNOSIS — Z170 Estrogen receptor positive status [ER+]: Secondary | ICD-10-CM

## 2021-03-13 DIAGNOSIS — C50411 Malignant neoplasm of upper-outer quadrant of right female breast: Secondary | ICD-10-CM | POA: Diagnosis not present

## 2021-03-13 NOTE — Progress Notes (Signed)
Elizabeth Ramirez  [phone] [phone]     ID: Elizabeth Ramirez DOB: 1945-11-26  MR#: [id number]  HER#:[id number]  Patient Care Team: Cari Caraway, MD as PCP - General (Family Medicine) Quindarius Cabello, Virgie Dad, MD as Consulting Physician (Oncology) Excell Seltzer, MD (Inactive) as Consulting Physician (General Surgery) Bobbye Charleston, MD as Consulting Physician (Obstetrics and Gynecology) Justice Britain, MD as Consulting Physician (Orthopedic Surgery) Rolm Bookbinder, MD as Consulting Physician (General Surgery) OTHER MD:   I connected with Elizabeth Ramirez on 03/13/21 at 10:00 AM EDT by telephone visit and verified that I am speaking with the correct person using two identifiers.   I discussed the limitations, risks, security and privacy concerns of performing an evaluation and management service by telemedicine and the availability of in-person appointments. I also discussed with the patient that there may be a patient responsible charge related to this service. The patient expressed understanding and agreed to proceed.   Other persons participating in the visit and their role in the encounter: None  Patient's location: Home Provider's location: Clinic   CHIEF COMPLAINT:  Ductal carcinoma in situ; lymphadenopathy  CURRENT TREATMENT: reactive lymphoid hyperplasia   INTERVAL HISTORY: Elizabeth Ramirez was contacted today for follow-up of her lymphadenopathy.  She also has a history of remote estrogen receptor positive breast cancer for which she she continues under observation.   She underwent bilateral screening mammography with tomography at [facility] on 01/23/2021 showing: breast density category B; no evidence of malignancy in either breast.   She also underwent chest CT on 01/31/2021 for follow up of pulmonary nodules. This showed: stable left upper lobe pulmonary nodule, safely considered benign; interval development of multiple new 4-5 mm  pulmonary nodules within right upper and lower lobes, indeterminate; progressive pathologic adenopathy within left axilla; stable left periaortic lymphadenopathy within visualized upper abdomen.  She underwent left axillary lymph node excision under Dr. Donne Hazel on 02/18/2021.  The pathology [id number]) shows florid lymphoid hyperplasia.  There is no metastatic malignancy and no evidence to suggest a lymphoproliferative process.  Dr. Gari Crown suggested the possibility of sarcoidosis or infection such as leishmaniasis or toxoplasmosis or other as the cause of the patient's lymphoid hyperplasia   REVIEW OF SYSTEMS: Elizabeth Ramirez did generally well with the recent excisional biopsy but has required drain placement.  She was supposed to come for an in person visit today but we changed to virtual for that reason.  To review: She lost about 75 pounds in 18 months she says by following the NOOM program.  She also exercises about 3 days a week for about 30 minutes. She has had no temperatures, no unexplained fatigue, no shortness of breath cough or phlegm production no rash and no pruritus.  A detailed review of systems was otherwise stable   COVID 19 VACCINATION STATUS: Pfizer x4, most recently 09/2020; infection 10/2020   BREAST CANCER HISTORY: From the original intake note:  Elizabeth Ramirez underwent Right lumpectomy and sentinel lymph node biopsy April 2005 for 4 mm, grade 1 tubular tumor (T1a No, stage IA), in the setting of DCIS. This was estrogen and progesterone receptor positive, HER-2 not amplified. Margins were ample. She participated in the MA-27 study and received anastrozole between June 2005 and June 2010, at which time she was released from follow-up.  More recently, Elizabeth Ramirez had screening bilateral mammography showing a suspicious area in the right breast and on 12/14/2014 underwent right diagnostic mammography at the breast Center. The breast density was category A. There was  a group of pleomorphic calcifications  in the upper central right breast spanning up to 2.8 cm. Biopsy of this area was obtained 12/21/2014 ([id number]) and showed ductal carcinoma in situ, grade 2 or 3, estrogen receptor 95% positive, and progesterone receptor 60% positive, both with strong staining intensity.  The patient's case was presented at the multidisciplinary breast cancer conference 01/02/2015. At that time a preliminary plan was proposed, namely genetics counseling and if no mutation noted breast MRI to be followed by lumpectomy, radiation, and anti-estrogens.  Her subsequent history is as detailed below.   PAST MEDICAL HISTORY: Past Medical History:  Diagnosis Date   Allergy    tetracycline, levofloxacin, crabs & bay leaves   Arthritis    Rt knee, Rt shoulder   Breast cancer ([place]) 2016; 2005   Separate primaries; 2016 - DCIS of R breast(surgery and radiation-last 04-04-15); ER/PR+ April 2005   Cancer [facility])    Ear infection 10/19/2013   Family history of breast cancer    Family history of multiple myeloma    Family history of ovarian cancer    Family history of pulmonary fibrosis    Family history of stomach cancer    Genetic testing 01/18/2015   Negative - no mutations found in any of 20 genes on the Breast/Ovarian Cancer Panel.  Additionally, no variants of uncertain significance (VUSs) were found.  The Breast/Ovarian gene panel offered by [facility] Elizabeth Pigeon, MD) includes sequencing and deletion/duplication analysis for the following 19 genes:  ATM, BARD1, BRCA1, BRCA2, BRIP1, CDH1, CHEK2, FANCC, MLH1, MSH2, MSH6, NBN, PALB2, PMS2, PTEN, RAD51C, RAD51D, TP53, and XRCC2.  This panel also includes deletion/duplication analysis (without sequencing) for one gene, EPCAM.  Date of report is January 14, 2015.     Heart murmur    History of kidney stones    x1 '71   Hypothyroid 07/29/2011   Hypothyroidism    Malignant neoplasm of upper-outer quadrant of right breast in female, estrogen receptor positive ([place])  01/14/2015   OA (osteoarthritis) of knee 05/27/2015   Obesity, morbid, BMI 40.0-49.9 ([place]) 12/31/2015   Other chronic pain    Personal history of radiation therapy    PONV (postoperative nausea and vomiting)    extreme nausea and vomiting - cannot tolerate scopalamine    S/P total knee arthroplasty 05/27/2015   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   Right proximal bridge of nose ([id number])   Seasonal allergies    Thyroid disease    Vitamin D deficiency     PAST SURGICAL HISTORY: 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   BREAST LUMPECTOMY Right 2016   BREAST LUMPECTOMY WITH NEEDLE LOCALIZATION Right 02/11/2015   Procedure: WIRE BRACKETED RIGHT BREAST LUMPECTOMY ;  Surgeon: Excell Seltzer, MD;  Location: [place];  Service: General;  Laterality: Right;   CATARACT EXTRACTION, BILATERAL  10-05-2017 left ; 11-09-2017 right   Dr Kandy Garrison    colonoscopy with biopsy  12/28/2003   neg   LYMPH NODE DISSECTION Right 02/23/2020   Procedure: right inguinal lymph node excision;  Surgeon: Alphonsa Overall, MD;  Location: [place];  Service: General;  Laterality: Right;   RADIOACTIVE SEED GUIDED AXILLARY SENTINEL LYMPH NODE Left 02/18/2021   Procedure: RADIOACTIVE SEED GUIDED LEFT AXILLARY LYMPH NODE EXCISION;  Surgeon: Rolm Bookbinder, MD;  Location: [place];  Service: General;  Laterality: Left;   REVERSE SHOULDER ARTHROPLASTY Left  07/14/2018   Procedure: REVERSE SHOULDER ARTHROPLASTY;  Surgeon: Justice Britain, MD;  Location: [facility];  Service: Orthopedics;  Laterality: Left;  Interscalene Block   TONSILLECTOMY     1954   TOTAL KNEE ARTHROPLASTY Right 05/27/2015   Procedure: RIGHT TOTAL KNEE ARTHROPLASTY;  Surgeon: Gaynelle Arabian, MD;  Location: [facility];  Service: Orthopedics;  Laterality: Right;   TOTAL KNEE ARTHROPLASTY Left 05/20/2020   Procedure: TOTAL KNEE  ARTHROPLASTY;  Surgeon: Gaynelle Arabian, MD;  Location: [facility];  Service: Orthopedics;  Laterality: Left;  20mn   WISDOM TOOTH EXTRACTION Bilateral 1965    FAMILY HISTORY Family History  Problem Relation Age of Onset   Dementia Mother    Other Mother 834      "spot on lung"; secondhand smoke exposure   Pulmonary fibrosis Father    Diabetes Brother    Thyroid disease Brother    Leukemia Brother 762      chronic lymphocytic    Stomach cancer Maternal Grandmother        dx. 345s  Heart attack Maternal Grandfather    Diabetes Paternal Grandfather    Stroke Paternal Grandfather    Colitis Paternal Grandmother    Parkinson's disease Maternal Aunt    Diabetes Paternal Aunt    Other Paternal Aunt        bowel obstruction   Heart Problems Paternal Uncle    Ovarian cancer Maternal Aunt        dx. 564s paternal half sister of her mother   Multiple myeloma Maternal Aunt    Multiple myeloma Maternal Aunt 75   Kidney failure Paternal Aunt    Pulmonary fibrosis Paternal Aunt    Stroke Paternal Aunt    Heart Problems Paternal Aunt    Emphysema Paternal Uncle    Heart Problems Paternal Uncle    Lung cancer Paternal Uncle        smoker   Colon cancer Cousin    Breast cancer Cousin        dx. 550s  Pulmonary fibrosis CRemi Deterfather died at the age of 857from pulmonary fibrosis. Her mother died at age 7759with severe dementia. The patient had one brother no sisters. Her maternal grandmother died from stomach cancer the age of 368 The patient's mother had a half sister with ovarian cancer.   GYNECOLOGIC HISTORY:  No LMP recorded. Patient is postmenopausal.  menarche age 75 she isGX P0. Menopause 2003. 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    ADVANCED DIRECTIVES:  DArrie Aranis SChief Technology Officerof attorney   HEALTH MAINTENANCE: Social History   Tobacco Use   Smoking status: Former    Packs/day: 1.00    Years: 9.00    Pack years: 9.00    Types: Cigarettes    Quit date: 06/16/1983    Years since quitting: 37.7   Smokeless tobacco: Never  Vaping Use   Vaping Use: Never used  Substance Use Topics   Alcohol use: Yes    Alcohol/week: 2.0 standard drinks    Types: 2 Glasses of wine per week    Comment: Twice a week   Drug use: No  Colonoscopy:  20/15, [place] she knee  PAP: up-to-date/[place]  Bone density: October 2016; normal  Lipid panel:  Allergies  Allergen Reactions   Levofloxacin Other (See Comments)    Affected tendons, severe pain and swelling   Tetracyclines & Related Swelling    tongue swelling and peeling   Other Diarrhea, Rash and Other (See Comments)    Crabs - diarrhea. Bay leaves - rash in her mouth   Ciprofloxacin     Tendon problems    Flonase [Fluticasone]     Nasal spray cause nose bleeds   Ibuprofen     Gets mouth ulcers if she takes for more than 3 days; Aleve okay    Nitrofurantoin Monohyd Macro Hives   Pravastatin     Muscle aches, rash, constipation on 23m every other day dosing   Rosuvastatin     Muscle and joint pain on 120mdaily dosing   Scopolamine     "Headache and redness of eyes"    Current Outpatient Medications  Medication Sig Dispense Refill   Alirocumab (PRALUENT) 75 MG/ML SOAJ Inject 1 pen into the skin every 14 (fourteen) days. 2 mL 11   Calcium Carbonate-Vitamin D 600-400 MG-UNIT tablet 2 tablets     levothyroxine (SYNTHROID) 112 MCG tablet      oxyCODONE (OXY IR/ROXICODONE) 5 MG immediate release tablet Take 1 tablet (5 mg total) by mouth every 6 (six) hours as needed. 10 tablet 0   Current Facility-Administered Medications  Medication Dose Route Frequency Provider Last Rate Last Admin   0.9 %   sodium chloride infusion   Intravenous PRN ThEileen StanfordPA-C        OBJECTIVE:   There were no vitals filed for this visit.     There is no height or weight on file to calculate BMI.    ECOG FS:1 - Symptomatic but completely ambulatory  Televisit 03/13/2021  RESULTS: Angiotensin-converting enzyme levels 03/01/2020 and 05/06/2020 were both normal  CMP     Component Value Date/Time   NA 142 02/25/2021 1013   K 4.3 02/25/2021 1013   CL 106 02/25/2021 1013   CO2 26 02/25/2021 1013   GLUCOSE 98 02/25/2021 1013   BUN 17 02/25/2021 1013   CREATININE 1.17 (H) 02/25/2021 1013   CREATININE 1.28 (H) 01/26/2020 1220   CALCIUM 10.2 02/25/2021 1013   PROT 6.7 02/25/2021 1013   ALBUMIN 3.9 02/25/2021 1013   AST 15 02/25/2021 1013   AST 14 (L) 01/26/2020 1220   ALT 15 02/25/2021 1013   ALT 15 01/26/2020 1220   ALKPHOS 83 02/25/2021 1013   BILITOT 1.1 02/25/2021 1013   BILITOT 1.0 01/26/2020 1220   GFRNONAA 48 (L) 02/25/2021 1013   GFRNONAA 41 (L) 01/26/2020 1220   GFRAA 51 (L) 03/01/2020 1447   GFRAA 47 (L) 01/26/2020 1220    INo results found for: SPEP, UPEP  Lab Results  Component Value Date   WBC 2.7 (L) 02/04/2021   NEUTROABS 1.4 (L) 02/04/2021   HGB 13.5 02/04/2021   HCT 38.7 02/04/2021   MCV 86.8 02/04/2021   PLT 74 (L) 02/04/2021      Chemistry      Component Value Date/Time   NA 142 02/25/2021 1013   K 4.3 02/25/2021 1013   CL 106 02/25/2021 1013   CO2 26 02/25/2021 1013   BUN 17 02/25/2021 1013   CREATININE 1.17 (H) 02/25/2021 1013   CREATININE 1.28 (H) 01/26/2020 1220      Component Value Date/Time  CALCIUM 10.2 02/25/2021 1013   ALKPHOS 83 02/25/2021 1013   AST 15 02/25/2021 1013   AST 14 (L) 01/26/2020 1220   ALT 15 02/25/2021 1013   ALT 15 01/26/2020 1220   BILITOT 1.1 02/25/2021 1013   BILITOT 1.0 01/26/2020 1220       Lab Results  Component Value Date   LABCA2 22 12/04/2008    No components found for: [id number]  No results for  input(s): INR in the last 168 hours.  Urinalysis    Component Value Date/Time   COLORURINE YELLOW 05/20/2015 1120   APPEARANCEUR CLEAR 05/20/2015 1120   LABSPEC 1.009 05/20/2015 1120   PHURINE 6.5 05/20/2015 1120   GLUCOSEU NEGATIVE 05/20/2015 1120   HGBUR NEGATIVE 05/20/2015 1120   BILIRUBINUR NEGATIVE 05/20/2015 1120   BILIRUBINUR neg 12/19/2014 1427   KETONESUR NEGATIVE 05/20/2015 1120   PROTEINUR NEGATIVE 05/20/2015 1120   UROBILINOGEN 0.2 12/19/2014 1427   NITRITE NEGATIVE 05/20/2015 1120   LEUKOCYTESUR NEGATIVE 05/20/2015 1120    STUDIES: MM BREAST SURGICAL SPECIMEN  Result Date: 02/18/2021 CLINICAL DATA:  Post axillary dissection specimen radiograph EXAM: SPECIMEN RADIOGRAPH OF THE LEFT AXILLA COMPARISON:  Previous exam(s). FINDINGS: Status post excision of the left axilla. The radioactive seed and biopsy marker clip are present, completely intact, and were marked for pathology. IMPRESSION: Specimen radiograph of the left axilla. Electronically Signed   By: Audie Pinto M.D.   On: 02/18/2021 10:40  [place] LT RADIOACTIVE SEED LOC  Result Date: 02/14/2021 CLINICAL DATA:  Patient is scheduled for surgical excision of a LEFT axillary lymph node requiring preoperative radioactive seed localization. EXAM: ULTRASOUND GUIDED RADIOACTIVE SEED LOCALIZATION OF THE LEFT AXILLA COMPARISON:  Previous exam(s). FINDINGS: Patient presents for radioactive seed localization prior to surgical excision. I met with the patient and we discussed the procedure of seed localization including benefits and alternatives. We discussed the high likelihood of a successful procedure. We discussed the risks of the procedure including infection, bleeding, tissue injury and further surgery. We discussed the low dose of radioactivity involved in the procedure. Informed, written consent was given. The usual time-out protocol was performed immediately prior to the procedure. Using ultrasound guidance, sterile technique,  1% lidocaine and an I-125 radioactive seed, the LEFT axillary lymph node containing a biopsy clip was localized using a lateral approach. The follow-up mammogram images confirm the seed in the expected location and were marked for Dr. Donne Hazel. Follow-up survey of the patient confirms presence of the radioactive seed. Order number of I-125 seed:  [id number]. Total activity:  0.093 millicuries reference Date: 01/20/2021 The patient tolerated the procedure well and was released from the [facility]. She was given instructions regarding seed removal. IMPRESSION: Radioactive seed localization left axillary lymph node. The radioactive seed appeared to deploy adjacent to the lymph node. Location of the seed will be confirmed on a postprocedure mammogram. No apparent complications. Electronically Signed   By: Franki Cabot M.D.   On: 02/14/2021 14:22  MM CLIP PLACEMENT LEFT  Result Date: 02/14/2021 CLINICAL DATA:  Status post radioactive seed localization of a LEFT axillary lymph node. EXAM: DIAGNOSTIC LEFT MAMMOGRAM POST ULTRASOUND-GUIDED RADIOACTIVE SEED PLACEMENT COMPARISON:  Previous exam(s). FINDINGS: Mammographic images were obtained following ultrasound-guided radioactive seed placement. These demonstrate the radioactive seed 5 mm adjacent to the LEFT axillary lymph node that contains a biopsy clip. IMPRESSION: Adequate location of the radioactive seed, with radioactive seed located 5 mm lateral/superficial to the LEFT axillary lymph node that contains a biopsy clip. Final Assessment: Post  Procedure Mammograms for Seed Placement Electronically Signed   By: Franki Cabot M.D.   On: 02/14/2021 14:30    ASSESSMENT: 75 y.o. [place] woman  (1) status post right lumpectomy April 2005 for a pT1a pN0, stage IA invasive breast cancer, estrogen and progesterone receptor positive, HER-2 negative, in the setting of DCIS;   (a) did not receive radiation  (b) status post anastrozole for 5 years completed June  2010  (2) status post right breast upper outer quadrant biopsy 12/21/2014 for ductal carcinoma in situ, grade 2 or 3, estrogen and progesterone receptor positive.  (3) Status post right lumpectomy 02/11/2015 showing ductal carcinoma in situ, intermediate grade, measuring 0.9 cm, with close but negative margins  (4) adjuvant radiation:   03/07/2015-04/04/2015    Right breast/ 42.5 Gy at 2.5 Gy per fraction x 17 fractions.   Right breast boost/ 7.5 Gy at 2.5 Gy per fraction x 3 fractions  (5) genetic testing 01/14/2015 through the [place] Panel offered by [facility] Elizabeth Pigeon, MD) showed no deleterious mutations in ATM, BARD1, BRCA1, BRCA2, BRIP1, CDH1, CHEK2, FANCC, MLH1, MSH2, MSH6, NBN, PALB2, PMS2, PTEN, RAD51C, RAD51D, TP53, and XRCC2.  This panel also includes deletion/duplication analysis (without sequencing) for one gene, EPCAM.   (6) resumed anastrozole 07/06/2014, discontinued March 2017 with arthralgias and myalgias developing   (a) started letrozole 10/14/2015--discontinued after a brief course secondary to side effects  (b) Bone density 03/29/2015 was normal, with the lowest T score at 0.4  (7) additional genetics testing 07/23/2020 on the [facility] Multi-Cancer Panel + Pulmonary Fibrosis Panel found no deleterious mutations in ABCA3, ACD, AIP, ALK, APC*, ATM*, AXIN2, BAP1, BARD1, BLM, BMPR1A, BRCA1, BRCA2, BRIP1, CASR, CDC73, CDH1, CDK4, CDKN1B, CDKN1C, CDKN2A (p14ARF), CDKN2A (p16INK4a), CEBPA, CHEK2, CTC1, CTNNA1, DICER1*, DIS3L2*, DKC1, EGFR, EPCAM*, FH*, FLCN, GATA2, GPC3*, GREM1*, HOXB13, HRAS, KIT, MAX*, MEN1*, MET*, MITF, MLH1*, MSH2*, MSH3*, MSH6*, MUTYH, NAF1, NBN, NF1*, NF2, NHP2, NOP10, NTHL1, PALB2, PARN, PDGFRA, PHOX2B*, PMS2*, POLD1*, POLE, POT1, PRKAR1A, PTCH1, PTEN*, RAD50, RAD51C, RAD51D, RB1*, RECQL4*, RET, RTEL1, RUNX1, SDHA*, SDHAF2, SDHB, SDHC*, SDHD, SFTPC, SMAD4, SMARCA4, SMARCB1, SMARCE1, STK11, SUFU, TERC, TERT, TINF2, TMEM127,  TP53, TSC1*, TSC2, USB1, VHL, WRAP53, WRN*, WT1.  (a) VUS in ALK identified called [id number].   (8) possible lymphoproliferative disease:  (A) chest CT obtained to follow nonspecific pulmonary nodules 01/26/2020 shows left axillary and left celiac adenopathy.  (B) PET scan 02/08/2020 shows generalized lymphadenopathy, which is hypermetabolic, together with splenic enlargement  (C) right inguinal lymph node biopsy 00/93/8182 showed follicular hyperplasia and epithelioid granulomas, no morphologic or immunophenotypic evidence of lymphoma.  This was read as most consistent with a reactive process without excluding sarcoidosis  (D) chest CT scan 08/02/2020 again shows left axillary subpectoral celiac and retroperitoneal adenopathy as well as multiple very small scattered pulmonary nodules  (E) left axillary lymph node biopsy 11/27/2020 shows an atypical lymphoid proliferation with no B-cell monoclonal component.  (F) left axillary lymph node excision 02/18/2021 showed florid reactive lymphoid hyperplasia with no evidence of a lymphoproliferative process and no metastatic malignancy.  Sarcoidosis was not excluded nor was infection (leishmaniasis, toxoplasmosis etc.).   (9) ancillary work-up work-up:  (A) HIV screen 03/01/2020 nonreactive, CRP was 1.1, angiotensin-converting enzyme was 56 (normal)   PLAN: I discussed the overall situation with Sephira and went over the pathology from her excisional lymph node biopsy.  This is very reassuring and that we do not see evidence of malignancy.  Certainly there is no carcinoma and there is  also no evidence of a lymphoproliferative process.  What we are seeing is reactive.  Dr. Gari Crown makes several suggestions and certainly we could be dealing with a chronic inflammatory/infectious condition which I have not been able to determine.  At this point I think the best move is to refer to infectious disease for further work-up.  Center is very much in favor of this.  I  am placing the referral today.  I am also making a virtual visit with me in about 2 months just to catch up on further developments.  Total encounter time 20 minutes.*   Haven Foss, Virgie Dad, MD  03/13/21 7:45 AM Medical Oncology and Hematology [facility] [place], [place] [address] Tel. [phone]    Fax. [phone]    I, Wilburn Mylar, am acting as scribe for Dr. Virgie Dad. Deiona Hooper.  I, Lurline Del MD, have reviewed the above documentation for accuracy and completeness, and I agree with the above.   *Total Encounter Time as defined by the Centers for Medicare and Medicaid Services includes, in addition to the face-to-face time of a patient visit (documented in the note above) non-face-to-face time: obtaining and reviewing outside history, ordering and reviewing medications, tests or procedures, care coordination (communications with other health care professionals or caregivers) and documentation in the medical record.

## 2021-03-18 ENCOUNTER — Encounter: Payer: Self-pay | Admitting: Oncology

## 2021-03-18 DIAGNOSIS — Z23 Encounter for immunization: Secondary | ICD-10-CM | POA: Diagnosis not present

## 2021-04-01 ENCOUNTER — Ambulatory Visit: Payer: Medicare Other | Admitting: Infectious Disease

## 2021-04-04 ENCOUNTER — Encounter: Payer: Self-pay | Admitting: Infectious Disease

## 2021-04-04 ENCOUNTER — Other Ambulatory Visit: Payer: Self-pay

## 2021-04-04 ENCOUNTER — Ambulatory Visit (INDEPENDENT_AMBULATORY_CARE_PROVIDER_SITE_OTHER): Payer: Medicare Other | Admitting: Infectious Disease

## 2021-04-04 VITALS — BP 124/80 | HR 77 | Temp 97.7°F | Ht 64.0 in | Wt 195.0 lb

## 2021-04-04 DIAGNOSIS — Z23 Encounter for immunization: Secondary | ICD-10-CM | POA: Diagnosis not present

## 2021-04-04 DIAGNOSIS — Z170 Estrogen receptor positive status [ER+]: Secondary | ICD-10-CM

## 2021-04-04 DIAGNOSIS — L52 Erythema nodosum: Secondary | ICD-10-CM | POA: Diagnosis not present

## 2021-04-04 DIAGNOSIS — Z96659 Presence of unspecified artificial knee joint: Secondary | ICD-10-CM

## 2021-04-04 DIAGNOSIS — C50411 Malignant neoplasm of upper-outer quadrant of right female breast: Secondary | ICD-10-CM | POA: Diagnosis not present

## 2021-04-04 DIAGNOSIS — R599 Enlarged lymph nodes, unspecified: Secondary | ICD-10-CM

## 2021-04-04 DIAGNOSIS — Z96612 Presence of left artificial shoulder joint: Secondary | ICD-10-CM

## 2021-04-04 DIAGNOSIS — Z114 Encounter for screening for human immunodeficiency virus [HIV]: Secondary | ICD-10-CM

## 2021-04-04 DIAGNOSIS — Z889 Allergy status to unspecified drugs, medicaments and biological substances status: Secondary | ICD-10-CM | POA: Diagnosis not present

## 2021-04-04 HISTORY — DX: Erythema nodosum: L52

## 2021-04-04 HISTORY — DX: Allergy status to unspecified drugs, medicaments and biological substances: Z88.9

## 2021-04-04 NOTE — Progress Notes (Signed)
Subjective:  Reason for infectious disease consult: Lymphadenopathy that is thought to be reactive with some histiocytes and granulomas   Requesting physician: Lurline Del, MD   Patient ID: Elizabeth Ramirez, female    DOB: March 28, 1946, 75 y.o.   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  She had participated in [place] 27 study and received anastrozole t 22,005 2010.  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  She had anastrozole again in January 2016 but stopped in 2017 when she was suffering from arthralgias and myalgias.  She then started letrozole in May 2017 but stopped after a brief course due to side effects.  In the interim she was found on chest CT to have some nonspecific pulmonary nodules as well as left axillary and left iliac adenopathy in 2021.  PET scan performed showed generalized lymphadenopathy with hypermetabolism and splenic enlargement.  She underwent right inguinal lymph node biopsy and February 23, 2020 which showed a follicular hyperplasia and epithelioid granulomas but no evidence of lymphoma.  Cyst thought to be due to reactive process and possibly sarcoidosis.  She was seen by Dr. Chase Caller for possibility of sarcoidosis. He was able to run QF gold which was negative and HIV test that was negative.  More recently she had undergone a CT of the chest on January 31, 2021 to follow-up pulmonary nodules.  This showed stability of the left upper lobe pulmonary nodule which was thought to be benign as well as new 4.5 pulmonary nodules in the right upper and lower lobes along with what was felt to be pathologic adenopathy in her left axilla as well as stable  periaortic lymphadenopathy.  She says that there is initially an attempt at IR guided biopsy but this was unsuccessful and then she ultimately underwent left axillary lymph node dissection with Dr. Donne Hazel on February 18, 2021.  Pathology came back with florid lymphoid hyperplasia felt to be reactive.  There was no evidence of lymphoproliferative process there were some areas that were some scattered epithelioid histiocytes.  She has been referred to [place] for work-up from an infectious disease standpoint of her lymphadenopathy.  She grew up in [place] and did graduate school in [place]  She has traveled to many countries and [place], as well as [place].  In [place] she has traveled throughout the country except for the [place] of the night states that she has been to [place].  She does have a cat at home but does not had any recent scratches and does not recall a scratch from a cat having caused lymph node enlargement.  She herself was unaware of her lymph nodes being enlarged and is asymptomatic.  She does not have fevers chills or other symptoms.  She told me that if one of the thoughts is that this is sarcoidosis she would not want to be on steroids because she does not tolerate them well at all.  She has a female partner who she has been with for several decades.  She has no history of intravenous drug use tattoos or prior transfusions.  She was interested in being tested for hepatitis C and B she was  tested for HIV by Dr. Chase Caller and tested negative I attempted to order HIV test here but Medicare is age restriction which I find ludicrous prevented me from doing this without a waiver.  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  This was several decades ago.     Past Medical History:  Diagnosis Date   Allergy    tetracycline,  levofloxacin, crabs & bay leaves   Arthritis    Rt knee, Rt shoulder   Breast cancer ([place]) 2016; 2005   Separate primaries; 2016 - DCIS of R breast(surgery and radiation-last 04-04-15); ER/PR+ April 2005   Cancer [facility])    Ear infection 10/19/2013   Family history of breast cancer    Family history of multiple myeloma    Family history of ovarian cancer    Family history of pulmonary fibrosis    Family history of stomach cancer    Genetic testing 01/18/2015   Negative - no mutations found in any of 20 genes on the Breast/Ovarian Cancer Panel.  Additionally, no variants of uncertain significance (VUSs) were found.  The Breast/Ovarian gene panel offered by [facility] [name], MD) includes sequencing and deletion/duplication analysis for the following 19 genes:  ATM, BARD1, BRCA1, BRCA2, BRIP1, CDH1, CHEK2, FANCC, MLH1, MSH2, MSH6, NBN, PALB2, PMS2, PTEN, RAD51C, RAD51D, TP53, and XRCC2.  This panel also includes deletion/duplication analysis (without sequencing) for one gene, EPCAM.  Date of report is January 14, 2015.     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   Right proximal bridge of nose (CX35FU)   Seasonal allergies    Thyroid disease    Vitamin D deficiency     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   BREAST LUMPECTOMY Right 2016   BREAST LUMPECTOMY WITH NEEDLE LOCALIZATION Right  02/11/2015   Procedure: WIRE BRACKETED RIGHT BREAST LUMPECTOMY ;  Surgeon: Excell Seltzer, MD;  Location: [facility];  Service: General;  Laterality: Right;   CATARACT EXTRACTION, BILATERAL  10-05-2017 left ; 11-09-2017 right   Dr Kandy Garrison    colonoscopy with biopsy  12/28/2003   neg   EVACUATION BREAST HEMATOMA Left 03/11/2021   Procedure: LEFT AXILLARY SEROMA DRAINAGE AND DRAIN PLACEMENT;  Surgeon: Rolm Bookbinder, MD;  Location: [facility];  Service: General;  Laterality: Left;   LYMPH NODE DISSECTION Right 02/23/2020   Procedure: right inguinal lymph node excision;  Surgeon: Alphonsa Overall, MD;  Location: [facility];  Service: General;  Laterality: Right;   RADIOACTIVE SEED GUIDED AXILLARY SENTINEL LYMPH NODE Left 02/18/2021   Procedure: RADIOACTIVE SEED GUIDED LEFT AXILLARY LYMPH NODE EXCISION;  Surgeon: Rolm Bookbinder, MD;  Location: [facility];  Service: General;  Laterality: Left;   REVERSE SHOULDER ARTHROPLASTY  Left 07/14/2018   Procedure: REVERSE SHOULDER ARTHROPLASTY;  Surgeon: Justice Britain, MD;  Location: [facility];  Service: Orthopedics;  Laterality: Left;  Interscalene Block   TONSILLECTOMY     1954   TOTAL KNEE ARTHROPLASTY Right 05/27/2015   Procedure: RIGHT TOTAL KNEE ARTHROPLASTY;  Surgeon: Gaynelle Arabian, MD;  Location: [facility];  Service: Orthopedics;  Laterality: Right;   TOTAL KNEE ARTHROPLASTY Left 05/20/2020   Procedure: TOTAL KNEE ARTHROPLASTY;  Surgeon: Gaynelle Arabian, MD;  Location: [facility];  Service: Orthopedics;  Laterality: Left;  67mn   WISDOM TOOTH EXTRACTION Bilateral 1965    Family History  Problem Relation Age of Onset   Dementia Mother    Other Mother 826      "spot on lung"; secondhand smoke exposure   Pulmonary fibrosis Father    Diabetes Brother    Thyroid disease Brother    Leukemia Brother 756      chronic lymphocytic    Stomach cancer Maternal Grandmother        dx. 372s  Heart  attack Maternal Grandfather    Diabetes Paternal Grandfather    Stroke Paternal Grandfather    Colitis Paternal Grandmother    Parkinson's disease Maternal Aunt    Diabetes Paternal Aunt    Other Paternal Aunt        bowel obstruction   Heart Problems Paternal Uncle    Ovarian cancer Maternal Aunt        dx. 537s paternal half sister of her mother   Multiple myeloma Maternal Aunt    Multiple myeloma Maternal Aunt 75   Kidney failure Paternal Aunt    Pulmonary fibrosis Paternal Aunt    Stroke Paternal Aunt    Heart Problems Paternal Aunt    Emphysema Paternal Uncle    Heart Problems Paternal Uncle    Lung cancer Paternal Uncle        smoker   Colon cancer Cousin    Breast cancer Cousin        dx. 51s  Pulmonary fibrosis Cousin       Social History   Socioeconomic History   Marital status: DSoil scientist   Spouse name: Not on file   Number of children: 0   Years of education: Not on file   Highest education level: Not on file  Occupational History   Not on file  Tobacco Use   Smoking status: Former    Packs/day: 1.00    Years: 9.00    Pack years: 9.00    Types: Cigarettes    Quit date: 06/16/1983    Years since quitting: 37.8   Smokeless tobacco: Never  Vaping Use   Vaping Use: Never used  Substance and Sexual Activity   Alcohol use: Yes    Alcohol/week: 2.0 standard drinks    Types: 2 Glasses of wine per week    Comment: Twice a week   Drug use: No   Sexual activity: Not on file  Other Topics Concern   Not on file  Social History Narrative   Not on file   Social Determinants of Health   Financial Resource Strain: Not on file  Food Insecurity: Not on file  Transportation Needs: Not on file  Physical Activity: Not on file  Stress: Not on file  Social Connections: Not on file    Allergies  Allergen Reactions   Levofloxacin Other (See Comments)    Affected tendons, severe pain and swelling   Tetracyclines & Related Swelling  tongue swelling  and peeling   Other Diarrhea, Rash and Other (See Comments)    Crabs - diarrhea. Bay leaves - rash in her mouth   Ciprofloxacin     Tendon problems    Flonase [Fluticasone]     Nasal spray cause nose bleeds   Ibuprofen     Gets mouth ulcers if she takes for more than 3 days; Aleve okay    Nitrofurantoin Monohyd Macro Hives   Pravastatin     Muscle aches, rash, constipation on 89m every other day dosing   Rosuvastatin     Muscle and joint pain on 160mdaily dosing   Scopolamine     "Headache and redness of eyes"     Current Outpatient Medications:    Alirocumab (PRALUENT) 75 MG/ML SOAJ, Inject 1 pen into the skin every 14 (fourteen) days., Disp: 2 mL, Rfl: 11   Calcium Carbonate-Vitamin D 600-400 MG-UNIT tablet, 2 tablets, Disp: , Rfl:    levothyroxine (SYNTHROID) 112 MCG tablet, , Disp: , Rfl:    oxyCODONE (OXY IR/ROXICODONE) 5 MG immediate release tablet, Take 1 tablet (5 mg total) by mouth every 6 (six) hours as needed., Disp: 10 tablet, Rfl: 0  Current Facility-Administered Medications:    0.9 %  sodium chloride infusion, , Intravenous, PRN, ThEileen StanfordPA-C    Review of Systems  Constitutional:  Negative for activity change, appetite change, chills, diaphoresis, fatigue, fever and unexpected weight change.  HENT:  Negative for congestion, rhinorrhea, sinus pressure, sneezing, sore throat and trouble swallowing.   Eyes:  Negative for photophobia and visual disturbance.  Respiratory:  Negative for cough, chest tightness, shortness of breath, wheezing and stridor.   Cardiovascular:  Negative for chest pain, palpitations and leg swelling.  Gastrointestinal:  Negative for abdominal distention, abdominal pain, anal bleeding, blood in stool, constipation, diarrhea, nausea and vomiting.  Genitourinary:  Negative for difficulty urinating, dysuria, flank pain and hematuria.  Musculoskeletal:  Negative for arthralgias, back pain, gait problem, joint swelling and myalgias.   Skin:  Negative for color change, pallor, rash and wound.  Neurological:  Negative for dizziness, tremors, weakness and light-headedness.  Hematological:  Negative for adenopathy. Does not bruise/bleed easily.  Psychiatric/Behavioral:  Negative for agitation, behavioral problems, confusion, decreased concentration, dysphoric mood and sleep disturbance.       Objective:   Physical Exam Constitutional:      General: She is not in acute distress.    Appearance: Normal appearance. She is well-developed. She is not ill-appearing or diaphoretic.  HENT:     Head: Normocephalic and atraumatic.     Right Ear: Hearing and external ear normal.     Left Ear: Hearing and external ear normal.     Nose: No nasal deformity or rhinorrhea.     Mouth/Throat:     Mouth: Mucous membranes are moist.     Tongue: No lesions. Tongue does not deviate from midline.     Pharynx: Uvula midline. No pharyngeal swelling, oropharyngeal exudate, posterior oropharyngeal erythema or uvula swelling.  Eyes:     General: No scleral icterus.    Conjunctiva/sclera: Conjunctivae normal.     Right eye: Right conjunctiva is not injected.     Left eye: Left conjunctiva is not injected.     Pupils: Pupils are equal, round, and reactive to light.  Neck:     Vascular: No JVD.  Cardiovascular:     Rate and Rhythm: Normal rate and regular rhythm.     Heart sounds: Normal  heart sounds, S1 normal and S2 normal. No murmur heard.   No friction rub. No gallop.  Pulmonary:     Effort: No respiratory distress.     Breath sounds: No stridor. No wheezing, rhonchi or rales.  Chest:     Chest wall: No tenderness.  Abdominal:     General: Bowel sounds are normal. There is no distension.     Palpations: Abdomen is soft.     Tenderness: There is no abdominal tenderness.  Musculoskeletal:        General: Normal range of motion.     Right shoulder: Normal.     Left shoulder: Normal.     Cervical back: Normal range of motion and neck  supple.     Right hip: Normal.     Left hip: Normal.     Right knee: Normal.     Left knee: Normal.  Lymphadenopathy:     Head:     Right side of head: No preauricular or posterior auricular adenopathy.     Left side of head: No preauricular or posterior auricular adenopathy.     Cervical: No cervical adenopathy.     Right cervical: No superficial cervical adenopathy.    Left cervical: No superficial cervical adenopathy.     Upper Body:     Left upper body: No axillary adenopathy.  Skin:    General: Skin is warm and dry.     Coloration: Skin is not pale.     Findings: No abrasion, bruising, ecchymosis, erythema, lesion or rash.     Nails: There is no clubbing.  Neurological:     Mental Status: She is alert and oriented to person, place, and time.     Sensory: No sensory deficit.     Coordination: Coordination normal.     Gait: Gait normal.  Psychiatric:        Attention and Perception: She is attentive.        Speech: Speech normal.        Behavior: Behavior normal. Behavior is cooperative.        Thought Content: Thought content normal.        Judgment: Judgment normal.          Assessment & Plan:  Reactive lymphadenopathy  I actually think that sarcoidosis is still a good possibility though again she would not want a t trial of steroids  I will check ACE level  I will recheck a QuantiFERON gold.  I will send urine for histoplasma antigen Blastomyces antigen serum for cryptococcal antigen and Coccidioides antibodies.  I will send toxo serologies though I really do not think that this is toxoplasmosis and the serologies will not give the answer either.  I will send an RPR  I attempted to check an HIV again but as mentioned Medicare would not allow this.  I will send CMV and EBV titers though they will undoubtedly show prior infection.  I will have her tissue sent to [facility] for PCR testing for bacteria fungi and mycobacteria.  She will  return to see me in roughly a month to review labs  Breast cancer: No evidence of active disease.  Multiple allergies she has had very poor tolerance to fluoroquinolones and says that tetracyclines have also made her mouth start peeling    I spent 82 minutes with the patient including than 50% of the time in face to face counseling of the patient regarding differential diagnosis of her lymphadenopathy, personally reviewing CT  chest done in August 2022, reviewing her pathology report from surgical biopsy CBC CMP labs along with  review of medical records in preparation for the visit and during the visit and in coordination of her care.

## 2021-04-11 LAB — HISTOPLASMA ANTIGEN, URINE: Histoplasma Antigen, urine: <0.2 ng/mL

## 2021-04-11 LAB — HEPATITIS A ANTIBODY, TOTAL: Hepatitis A AB,Total: NONREACTIVE

## 2021-04-11 LAB — BARTONELLA ANTIBODY PANEL
B. henselae IgG Screen: NEGATIVE
B. henselae IgM Screen: NEGATIVE

## 2021-04-11 LAB — CRYPTOCOCCAL AG, LTX SCR RFLX TITER
Cryptococcal Ag Screen: NOT DETECTED
MICRO NUMBER:: 12534847
SPECIMEN QUALITY:: ADEQUATE

## 2021-04-11 LAB — QUANTIFERON-TB GOLD PLUS
Mitogen-NIL: >10 IU/mL
NIL: 0.09 IU/mL
QuantiFERON-TB Gold Plus: NEGATIVE
TB1-NIL: 0 IU/mL
TB2-NIL: 0 IU/mL

## 2021-04-11 LAB — HEPATITIS C ANTIBODY
Hepatitis C Ab: NONREACTIVE
SIGNAL TO CUT-OFF: 0.04 (ref <1.00)

## 2021-04-11 LAB — EPSTEIN-BARR VIRUS VCA ANTIBODY PANEL
EBV NA IgG: <18 U/mL
EBV VCA IgG: 21.3 U/mL — ABNORMAL HIGH
EBV VCA IgM: <36 U/mL

## 2021-04-11 LAB — CMV IGM: CMV IgM: <30 AU/mL

## 2021-04-11 LAB — EPSTEIN-BARR VIRUS EARLY D ANTIGEN ANTIBODY, IGG: EBV EA IgG: <9 U/mL (ref <9.00)

## 2021-04-11 LAB — TOXOPLASMA ANTIBODIES- IGG AND  IGM
Toxoplasma Antibody- IgM: <8 AU/mL
Toxoplasma IgG Ratio: <7.2 IU/mL

## 2021-04-11 LAB — MVISTA BLASTOMYCES QNT AG, URINE
Interpretation: NEGATIVE
Result (ng/ml): NOT DETECTED ng/mL

## 2021-04-11 LAB — HEPATITIS B SURFACE ANTIBODY, QUANTITATIVE: Hep B S AB Quant (Post): <5 m[IU]/mL — ABNORMAL LOW (ref >=10)

## 2021-04-11 LAB — ANGIOTENSIN CONVERTING ENZYME: Angiotensin-Converting Enzyme: 71 U/L — ABNORMAL HIGH (ref 9–67)

## 2021-04-11 LAB — HEPATITIS B SURFACE ANTIGEN: Hepatitis B Surface Ag: NONREACTIVE

## 2021-04-11 LAB — CYTOMEGALOVIRUS ANTIBODY, IGG: Cytomegalovirus Ab-IgG: <0.6 U/mL

## 2021-04-11 LAB — COCCIDIOIDES ANTIBODIES: COCCIDIOIDES AB, CF, SERUM: <1:2 {titer}

## 2021-04-15 DIAGNOSIS — E039 Hypothyroidism, unspecified: Secondary | ICD-10-CM | POA: Diagnosis not present

## 2021-04-17 DIAGNOSIS — Z1389 Encounter for screening for other disorder: Secondary | ICD-10-CM | POA: Diagnosis not present

## 2021-04-17 DIAGNOSIS — B009 Herpesviral infection, unspecified: Secondary | ICD-10-CM | POA: Diagnosis not present

## 2021-04-17 DIAGNOSIS — M1712 Unilateral primary osteoarthritis, left knee: Secondary | ICD-10-CM | POA: Diagnosis not present

## 2021-04-17 DIAGNOSIS — E039 Hypothyroidism, unspecified: Secondary | ICD-10-CM | POA: Diagnosis not present

## 2021-04-17 DIAGNOSIS — Z0000 Encounter for general adult medical examination without abnormal findings: Secondary | ICD-10-CM | POA: Diagnosis not present

## 2021-04-17 DIAGNOSIS — E559 Vitamin D deficiency, unspecified: Secondary | ICD-10-CM | POA: Diagnosis not present

## 2021-04-17 DIAGNOSIS — N1832 Chronic kidney disease, stage 3b: Secondary | ICD-10-CM | POA: Diagnosis not present

## 2021-04-17 DIAGNOSIS — D0511 Intraductal carcinoma in situ of right breast: Secondary | ICD-10-CM | POA: Diagnosis not present

## 2021-04-17 DIAGNOSIS — N1831 Chronic kidney disease, stage 3a: Secondary | ICD-10-CM | POA: Diagnosis not present

## 2021-04-17 DIAGNOSIS — E782 Mixed hyperlipidemia: Secondary | ICD-10-CM | POA: Diagnosis not present

## 2021-04-17 DIAGNOSIS — J309 Allergic rhinitis, unspecified: Secondary | ICD-10-CM | POA: Diagnosis not present

## 2021-05-02 ENCOUNTER — Ambulatory Visit (INDEPENDENT_AMBULATORY_CARE_PROVIDER_SITE_OTHER): Payer: Medicare Other | Admitting: Infectious Disease

## 2021-05-02 ENCOUNTER — Other Ambulatory Visit: Payer: Self-pay

## 2021-05-02 ENCOUNTER — Encounter: Payer: Self-pay | Admitting: Infectious Disease

## 2021-05-02 VITALS — BP 114/74 | HR 80 | Temp 97.5°F | Wt 198.0 lb

## 2021-05-02 DIAGNOSIS — I2510 Atherosclerotic heart disease of native coronary artery without angina pectoris: Secondary | ICD-10-CM

## 2021-05-02 DIAGNOSIS — D869 Sarcoidosis, unspecified: Secondary | ICD-10-CM | POA: Diagnosis not present

## 2021-05-02 DIAGNOSIS — I888 Other nonspecific lymphadenitis: Secondary | ICD-10-CM

## 2021-05-02 DIAGNOSIS — C50411 Malignant neoplasm of upper-outer quadrant of right female breast: Secondary | ICD-10-CM | POA: Diagnosis not present

## 2021-05-02 DIAGNOSIS — R591 Generalized enlarged lymph nodes: Secondary | ICD-10-CM | POA: Diagnosis not present

## 2021-05-02 DIAGNOSIS — L52 Erythema nodosum: Secondary | ICD-10-CM

## 2021-05-02 DIAGNOSIS — R911 Solitary pulmonary nodule: Secondary | ICD-10-CM

## 2021-05-02 DIAGNOSIS — Z7185 Encounter for immunization safety counseling: Secondary | ICD-10-CM

## 2021-05-02 DIAGNOSIS — Z170 Estrogen receptor positive status [ER+]: Secondary | ICD-10-CM | POA: Diagnosis not present

## 2021-05-02 DIAGNOSIS — Z23 Encounter for immunization: Secondary | ICD-10-CM | POA: Diagnosis not present

## 2021-05-02 HISTORY — DX: Encounter for immunization safety counseling: Z71.85

## 2021-05-02 HISTORY — DX: Sarcoidosis, unspecified: D86.9

## 2021-05-02 HISTORY — DX: Other nonspecific lymphadenitis: I88.8

## 2021-05-02 NOTE — Progress Notes (Signed)
Subjective:   Chief Complaint: For lymphadenopathy with pathology with evidence of reactive hyperplasia and histiocytes on pathology   Patient ID: Elizabeth Ramirez, female    DOB: 1945/07/21, 75 y.o.   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  She had participated in [place] 27 study and received anastrozole t 22,005 2010.  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  She had anastrozole again in January 2016 but stopped in 2017 when she was suffering from arthralgias and myalgias.  She then started letrozole in May 2017 but stopped after a brief course due to side effects.  In the interim she was found on chest CT to have some nonspecific pulmonary nodules as well as left axillary and left iliac adenopathy in 2021.  PET scan performed showed generalized lymphadenopathy with hypermetabolism and splenic enlargement.  She underwent right inguinal lymph node biopsy and February 23, 2020 which showed a follicular hyperplasia and epithelioid granulomas but no evidence of lymphoma.  Cyst thought to be due to reactive process and possibly sarcoidosis.  She was seen by Dr. Chase Caller for possibility of sarcoidosis. He was able to run QF gold which was negative and HIV test that was negative.  More recently she had undergone a CT of the chest on January 31, 2021 to follow-up pulmonary nodules.  This showed stability of the left upper lobe pulmonary nodule which was thought to be benign as well as new 4.5 pulmonary nodules in the right upper and lower lobes along with what was felt to be pathologic adenopathy in her left axilla as well as stable periaortic lymphadenopathy.  She says that there is  initially an attempt at IR guided biopsy but this was unsuccessful and then she ultimately underwent left axillary lymph node dissection with Dr. Donne Hazel on February 18, 2021.  Pathology came back with florid lymphoid hyperplasia felt to be reactive.  There was no evidence of lymphoproliferative process there were some areas that were some scattered epithelioid histiocytes.  She has been referred to [place] for work-up from an infectious disease standpoint of her lymphadenopathy.  She grew up in [place] and did [place] in [place]  She has traveled to many countries and [place], as well as [place].  In [place] she has traveled throughout the country except for the [place] of the night states that she has been to [place].  She does have a cat at home but does not had any recent scratches and does not recall a scratch from a cat having caused lymph node enlargement.  She herself was unaware of her lymph nodes being enlarged and is asymptomatic.  She does not have fevers chills or other symptoms.  She told me that if one of the thoughts is that this is sarcoidosis she would not want to be on steroids because she does not tolerate them well at all.  She has a female partner who she has been with for several decades.  She has no history of intravenous drug use tattoos or prior transfusions.  She was interested in being tested for hepatitis C and B she was tested for HIV by Dr. Chase Caller and tested  negative I attempted to order HIV test here but Medicare is age restriction which I find ludicrous prevented me from doing this without a waiver.  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  This was several decades ago.  Work-up that we undertook in our office was pertinent for an angiotensin-converting enzyme that was elevated to 71 units/L.  Her  histoplasma antigen urine Blastomyces antigen urine were negative her Bartonella antibody panel was negative.  Her serum cryptococcal antigen was negative her QuantiFERON gold was negative her toxoplasma antibodies were negative.  CMV serologies were negative Epstein-Barr virus serologies consistent with past infection.  Syphilis titer negative.  I had her tissue sent for [place] [facility] [place] for [facility] were performed.  PCR's were negative for nontuberculous mycobacteria and for tuberculosis.  PCR's were also negative for fungi on broad fungal assay.  PCR's were negative for bacteria on broad bacterial 16 S PCR sequencing.  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  Additionally, no variants of uncertain significance (VUSs) were found.  The Breast/Ovarian gene panel offered by [facility] [name], MD) includes sequencing and deletion/duplication analysis for the following 19 genes:  ATM, BARD1, BRCA1, BRCA2, BRIP1, CDH1, CHEK2, FANCC, MLH1, MSH2, MSH6, NBN, PALB2, PMS2, PTEN, RAD51C, RAD51D, TP53, and XRCC2.  This panel also includes deletion/duplication analysis (without sequencing) for one gene,  EPCAM.  Date of report is January 14, 2015.     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   Right proximal bridge of nose (CX35FU)   Seasonal allergies    Thyroid disease    Vitamin D deficiency     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   BREAST LUMPECTOMY Right 2016   BREAST LUMPECTOMY WITH NEEDLE LOCALIZATION Right 02/11/2015   Procedure: WIRE BRACKETED RIGHT BREAST LUMPECTOMY ;  Surgeon: Marland Kitchen  Hoxworth, MD;  Location: [facility];  Service: General;  Laterality: Right;   CATARACT EXTRACTION, BILATERAL  10-05-2017 left ; 11-09-2017 right   Dr Kandy Garrison    colonoscopy with biopsy  12/28/2003   neg   EVACUATION BREAST HEMATOMA Left 03/11/2021   Procedure: LEFT AXILLARY SEROMA DRAINAGE AND DRAIN PLACEMENT;  Surgeon: Rolm Bookbinder, MD;  Location: [facility];  Service: General;  Laterality: Left;   LYMPH NODE DISSECTION Right 02/23/2020   Procedure: right inguinal lymph node excision;  Surgeon: Alphonsa Overall, MD;  Location: [facility];  Service: General;  Laterality: Right;   RADIOACTIVE SEED GUIDED AXILLARY SENTINEL LYMPH NODE Left 02/18/2021   Procedure: RADIOACTIVE SEED GUIDED LEFT AXILLARY LYMPH NODE EXCISION;  Surgeon: Rolm Bookbinder, MD;  Location: [facility];  Service: General;  Laterality: Left;    REVERSE SHOULDER ARTHROPLASTY Left 07/14/2018   Procedure: REVERSE SHOULDER ARTHROPLASTY;  Surgeon: Justice Britain, MD;  Location: [facility];  Service: Orthopedics;  Laterality: Left;  Interscalene Block   TONSILLECTOMY     1954   TOTAL KNEE ARTHROPLASTY Right 05/27/2015   Procedure: RIGHT TOTAL KNEE ARTHROPLASTY;  Surgeon: Gaynelle Arabian, MD;  Location: [facility];  Service: Orthopedics;  Laterality: Right;   TOTAL KNEE ARTHROPLASTY Left 05/20/2020   Procedure: TOTAL KNEE ARTHROPLASTY;  Surgeon: Gaynelle Arabian, MD;  Location: [facility];  Service: Orthopedics;  Laterality: Left;  56mn   WISDOM TOOTH EXTRACTION Bilateral 1965    Family History  Problem Relation Age of Onset   Dementia Mother    Other Mother 841      "spot on lung"; secondhand smoke exposure   Pulmonary fibrosis Father    Diabetes Brother    Thyroid disease Brother    Leukemia Brother 724      chronic lymphocytic    Stomach cancer Maternal Grandmother        dx. 345s  Heart attack Maternal Grandfather    Diabetes Paternal Grandfather    Stroke Paternal Grandfather    Colitis Paternal Grandmother    Parkinson's disease Maternal Aunt    Diabetes Paternal Aunt    Other Paternal Aunt        bowel obstruction   Heart Problems Paternal Uncle    Ovarian cancer Maternal Aunt        dx. 539s paternal half sister of her mother   Multiple myeloma Maternal Aunt    Multiple myeloma Maternal Aunt 75   Kidney failure Paternal Aunt    Pulmonary fibrosis Paternal Aunt    Stroke Paternal Aunt    Heart Problems Paternal Aunt    Emphysema Paternal Uncle    Heart Problems Paternal Uncle    Lung cancer Paternal Uncle        smoker   Colon cancer Cousin    Breast cancer Cousin        dx. 541s  Pulmonary fibrosis Cousin       Social History   Socioeconomic History   Marital status: DSoil scientist   Spouse name: Not on file   Number of children: 0   Years of education: Not on file   Highest education level: Not on file   Occupational History   Not on file  Tobacco Use   Smoking status: Former    Packs/day: 1.00    Years: 9.00    Pack years: 9.00    Types: Cigarettes    Quit date: 06/16/1983    Years  since quitting: 37.9   Smokeless tobacco: Never  Vaping Use   Vaping Use: Never used  Substance and Sexual Activity   Alcohol use: Yes    Alcohol/week: 2.0 standard drinks    Types: 2 Glasses of wine per week    Comment: Twice a week   Drug use: No   Sexual activity: Not on file  Other Topics Concern   Not on file  Social History Narrative   Not on file   Social Determinants of Health   Financial Resource Strain: Not on file  Food Insecurity: Not on file  Transportation Needs: Not on file  Physical Activity: Not on file  Stress: Not on file  Social Connections: Not on file    Allergies  Allergen Reactions   Levofloxacin Other (See Comments)    Affected tendons, severe pain and swelling   Tetracyclines & Related Swelling    tongue swelling and peeling   Other Diarrhea, Rash and Other (See Comments)    Crabs - diarrhea. Bay leaves - rash in her mouth   Ciprofloxacin     Tendon problems    Flonase [Fluticasone]     Nasal spray cause nose bleeds   Ibuprofen     Gets mouth ulcers if she takes for more than 3 days; Aleve okay    Nitrofurantoin Monohyd Macro Hives   Pravastatin     Muscle aches, rash, constipation on 57m every other day dosing   Rosuvastatin     Muscle and joint pain on 147mdaily dosing   Scopolamine     "Headache and redness of eyes"     Current Outpatient Medications:    Calcium Carbonate-Vitamin D 600-400 MG-UNIT tablet, 2 tablets, Disp: , Rfl:    Alirocumab (PRALUENT) 75 MG/ML SOAJ, Inject 1 pen into the skin every 14 (fourteen) days. (Patient not taking: Reported on 04/04/2021), Disp: 2 mL, Rfl: 11   levothyroxine (SYNTHROID) 112 MCG tablet, , Disp: , Rfl:    oxyCODONE (OXY IR/ROXICODONE) 5 MG immediate release tablet, Take 1 tablet (5 mg total) by mouth  every 6 (six) hours as needed., Disp: 10 tablet, Rfl: 0  Current Facility-Administered Medications:    0.9 %  sodium chloride infusion, , Intravenous, PRN, ThEileen StanfordPA-C    Review of Systems  Constitutional:  Negative for activity change, appetite change, chills, diaphoresis, fatigue, fever and unexpected weight change.  HENT:  Negative for congestion, rhinorrhea, sinus pressure, sneezing, sore throat and trouble swallowing.   Eyes:  Negative for photophobia and visual disturbance.  Respiratory:  Negative for cough, chest tightness, shortness of breath, wheezing and stridor.   Cardiovascular:  Negative for chest pain, palpitations and leg swelling.  Gastrointestinal:  Negative for abdominal distention, abdominal pain, anal bleeding, blood in stool, constipation, diarrhea, nausea and vomiting.  Genitourinary:  Negative for difficulty urinating, dysuria, flank pain and hematuria.  Musculoskeletal:  Negative for arthralgias, back pain, gait problem, joint swelling and myalgias.  Skin:  Negative for color change, pallor, rash and wound.  Neurological:  Negative for dizziness, tremors, weakness and light-headedness.  Hematological:  Negative for adenopathy. Does not bruise/bleed easily.  Psychiatric/Behavioral:  Negative for agitation, behavioral problems, confusion, decreased concentration, dysphoric mood and sleep disturbance.       Objective:   Physical Exam Constitutional:      General: She is not in acute distress.    Appearance: Normal appearance. She is well-developed. She is not ill-appearing or diaphoretic.  HENT:     Head: Normocephalic  and atraumatic.     Right Ear: Hearing and external ear normal.     Left Ear: Hearing and external ear normal.     Nose: No nasal deformity or rhinorrhea.  Eyes:     General: No scleral icterus.    Conjunctiva/sclera: Conjunctivae normal.     Right eye: Right conjunctiva is not injected.     Left eye: Left conjunctiva is not  injected.     Pupils: Pupils are equal, round, and reactive to light.  Neck:     Vascular: No JVD.  Cardiovascular:     Rate and Rhythm: Normal rate and regular rhythm.     Heart sounds: S1 normal and S2 normal.  Pulmonary:     Effort: Pulmonary effort is normal. No respiratory distress.     Breath sounds: No wheezing.  Abdominal:     General: There is no distension.     Palpations: Abdomen is soft.  Musculoskeletal:        General: Normal range of motion.     Right shoulder: Normal.     Left shoulder: Normal.     Cervical back: Normal range of motion and neck supple.     Right hip: Normal.     Left hip: Normal.     Right knee: Normal.     Left knee: Normal.  Lymphadenopathy:     Head:     Left side of head: No submandibular, preauricular or posterior auricular adenopathy.     Cervical:     Right cervical: No superficial or deep cervical adenopathy.    Left cervical: No superficial or deep cervical adenopathy.     Upper Body:     Left upper body: Axillary adenopathy present. No supraclavicular adenopathy.     Comments: Subtle residual adenopathy  Skin:    General: Skin is warm and dry.     Coloration: Skin is not pale.     Findings: No abrasion, bruising, ecchymosis, erythema, lesion or rash.     Nails: There is no clubbing.  Neurological:     General: No focal deficit present.     Mental Status: She is alert and oriented to person, place, and time.     Sensory: No sensory deficit.     Coordination: Coordination normal.     Gait: Gait normal.  Psychiatric:        Attention and Perception: She is attentive.        Mood and Affect: Mood normal.        Speech: Speech normal.        Behavior: Behavior normal. Behavior is cooperative.        Thought Content: Thought content normal.        Judgment: Judgment normal.          Assessment & Plan:  Reactive lymphadenopathy with histiocytes seen. I have reviewed all of the serologies listed above that we ordered in  clinic last time as well as the PCR's that we sent to [facility] I do not have a clear infectious etiology for her lymphadenopathy with negative serological work-up and negative PCR's.  Tissue was never sent for fungal culture or AFB stain and culture.  She lacks any symptoms to suggest serious infection without fevers chills weight loss or malaise.  Continue to think sarcoidosis seems the most likely explanation for her pathology  I would suggest that she re-establish with Dr. Chase Caller re this possibility though she herself does not want to be treated  and I do not see there is any clear indication to treat her at this point in time  Breast cancer: No evidence of active disease she is going to see Dr. [name] in early December  Multiple allergies she has had very poor tolerance to fluoroquinolones and says that tetracyclines have also made her mouth start peeling  Vaccine counseling: Patient said she was up-to-date on her respiratory virus vaccines but wanted a tetanus vaccine which was due and we administered this.

## 2021-05-20 NOTE — Progress Notes (Signed)
[place]  [phone] Fax:(336) [phone]    ID: Elizabeth Ramirez DOB: 04/18/1946  MR#: [id number]  JSH#:[id number]  Patient Care Team: Cari Caraway, MD as PCP - General (Family Medicine) Yishai Rehfeld, Virgie Dad, MD as Consulting Physician (Oncology) Excell Seltzer, MD (Inactive) as Consulting Physician (General Surgery) Bobbye Charleston, MD as Consulting Physician (Obstetrics and Gynecology) Justice Britain, MD as Consulting Physician (Orthopedic Surgery) Rolm Bookbinder, MD as Consulting Physician (General Surgery) OTHER MD:    CHIEF COMPLAINT:  Ductal carcinoma in situ; sarcoidosis   CURRENT TREATMENT: Continued observation   INTERVAL HISTORY: Elizabeth Ramirez returns today for follow-up of her lymphadenopathy.  She also has a history of remote estrogen receptor positive breast cancer for which she she continues under observation.   Since her last visit here we referred her to Dr. Tommy Medal for evaluation of her reactive lymphadenopathy versus sarcoidosis.  Please see his excellent notes.  The bottom line: Despite extensive work-up no infectious agent could be demonstrated.  His suggestion is that she return to Dr. Chase Caller with a working diagnosis of sarcoidosis.  I concur  REVIEW OF SYSTEMS: Elizabeth Ramirez has lost 78 pounds over the last 2 years using the [employer] system.  She pays $186 every 6 months which she says is about the same as similar programs.  She is walking for exercise but not as often as she knows she should and she is hiring a Clinical research associate beginning in January to increase the motivation.  Currently she has no fevers, drenching sweats, rash, pruritus, symptomatic adenopathy, or other systemic symptoms.  Detailed review of systems was otherwise stable.   COVID 19 VACCINATION STATUS: Pfizer x5, as of December 2022; infection 10/2020   BREAST CANCER HISTORY: From the original intake note:  Elizabeth Ramirez underwent Right lumpectomy and sentinel lymph node biopsy April  2005 for 4 mm, grade 1 tubular tumor (T1a No, stage IA), in the setting of DCIS. This was estrogen and progesterone receptor positive, HER-2 not amplified. Margins were ample. She participated in the MA-27 study and received anastrozole between June 2005 and June 2010, at which time she was released from follow-up.  More recently, Elizabeth Ramirez had screening bilateral mammography showing a suspicious area in the right breast and on 12/14/2014 underwent right diagnostic mammography at the breast Center. The breast density was category A. There was a group of pleomorphic calcifications in the upper central right breast spanning up to 2.8 cm. Biopsy of this area was obtained 12/21/2014 ([id number]) and showed ductal carcinoma in situ, grade 2 or 3, estrogen receptor 95% positive, and progesterone receptor 60% positive, both with strong staining intensity.  The patient's case was presented at the multidisciplinary breast cancer conference 01/02/2015. At that time a preliminary plan was proposed, namely genetics counseling and if no mutation noted breast MRI to be followed by lumpectomy, radiation, and anti-estrogens.  Her subsequent history is as detailed below.   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  Additionally, no variants of uncertain significance (VUSs) were found.  The  Breast/Ovarian gene panel offered by [facility] [name], MD) includes sequencing and deletion/duplication analysis for the following 19 genes:  ATM, BARD1, BRCA1, BRCA2, BRIP1, CDH1, CHEK2, FANCC, MLH1, MSH2, MSH6, NBN, PALB2, PMS2, PTEN, RAD51C, RAD51D, TP53, and XRCC2.  This panel also includes deletion/duplication analysis (without sequencing) for one gene, EPCAM.  Date of report is January 14, 2015.     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 05/02/2021   OA (osteoarthritis) of knee 05/27/2015   Obesity, morbid, BMI 40.0-49.9 ([place]) 12/31/2015   Other chronic pain    Personal history of radiation therapy    PONV (postoperative nausea and vomiting)    extreme nausea and vomiting - cannot tolerate scopalamine    S/P total knee arthroplasty 05/27/2015   Sarcoid 05/02/2021   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   Right proximal bridge of nose ([id number])   Seasonal allergies    Thyroid disease    Vaccine counseling 05/02/2021   Vitamin D deficiency     PAST SURGICAL HISTORY: 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   BREAST LUMPECTOMY Right 2016   BREAST LUMPECTOMY WITH NEEDLE LOCALIZATION Right 02/11/2015   Procedure: WIRE BRACKETED RIGHT BREAST LUMPECTOMY ;  Surgeon: Excell Seltzer, MD;  Location: [place];  Service: General;  Laterality: Right;   CATARACT EXTRACTION, BILATERAL  10-05-2017 left ; 11-09-2017 right   Dr Kandy Garrison    colonoscopy with biopsy  12/28/2003   neg   EVACUATION BREAST HEMATOMA Left 03/11/2021   Procedure: LEFT AXILLARY SEROMA DRAINAGE AND DRAIN PLACEMENT;  Surgeon: Rolm Bookbinder, MD;   Location: [place];  Service: General;  Laterality: Left;   LYMPH NODE DISSECTION Right 02/23/2020   Procedure: right inguinal lymph node excision;  Surgeon: Alphonsa Overall, MD;  Location: [place];  Service: General;  Laterality: Right;   RADIOACTIVE SEED GUIDED AXILLARY SENTINEL LYMPH NODE Left 02/18/2021   Procedure: RADIOACTIVE SEED GUIDED LEFT AXILLARY LYMPH NODE EXCISION;  Surgeon: Rolm Bookbinder, MD;  Location: [place];  Service: General;  Laterality: Left;   REVERSE SHOULDER ARTHROPLASTY Left 07/14/2018   Procedure: REVERSE SHOULDER ARTHROPLASTY;  Surgeon: Justice Britain, MD;  Location: [facility];  Service: Orthopedics;  Laterality: Left;  Interscalene Block   TONSILLECTOMY     1954   TOTAL KNEE ARTHROPLASTY Right 05/27/2015   Procedure: RIGHT TOTAL KNEE ARTHROPLASTY;  Surgeon: Gaynelle Arabian, MD;  Location: [facility];  Service: Orthopedics;  Laterality: Right;   TOTAL KNEE ARTHROPLASTY Left 05/20/2020   Procedure: TOTAL KNEE ARTHROPLASTY;  Surgeon: Gaynelle Arabian, MD;  Location: [facility];  Service: Orthopedics;  Laterality: Left;  45mn   WISDOM TOOTH EXTRACTION Bilateral 1965    FAMILY HISTORY Family History  Problem Relation Age of Onset   Dementia Mother    Other Mother 863      "spot on lung"; secondhand smoke exposure   Pulmonary  fibrosis Father    Diabetes Brother    Thyroid disease Brother    Leukemia Brother 57       chronic lymphocytic    Stomach cancer Maternal Grandmother        dx. 31s   Heart attack Maternal Grandfather    Diabetes Paternal Grandfather    Stroke Paternal Grandfather    Colitis Paternal Grandmother    Parkinson's disease Maternal Aunt    Diabetes Paternal Aunt    Other Paternal Aunt        bowel obstruction   Heart Problems Paternal Uncle    Ovarian cancer Maternal Aunt        dx. 66s; paternal half sister of her mother   Multiple myeloma Maternal Aunt    Multiple myeloma Maternal Aunt 75   Kidney  failure Paternal Aunt    Pulmonary fibrosis Paternal Aunt    Stroke Paternal Aunt    Heart Problems Paternal Aunt    Emphysema Paternal Uncle    Heart Problems Paternal Uncle    Lung cancer Paternal Uncle        smoker   Colon cancer Cousin    Breast cancer Cousin        dx. 68s   Pulmonary fibrosis Remi Deter father died at the age of 58 from pulmonary fibrosis. Her mother died at age 28 with severe dementia. The patient had one brother no sisters.  Her brother has Parkinson's disease and chronic lymphoid leukemia.  Her maternal grandmother died from stomach cancer the age of 18. The patient's mother had a half sister with ovarian cancer.   GYNECOLOGIC HISTORY:  No LMP recorded. Patient is postmenopausal.  menarche age 15, she isGX P0. Menopause 2003. Did not take hormone replacement   SOCIAL HISTORY:  Works as a Cabin crew, Publishing copy in renovations. She retired from [employer] as of 2018 and is now doing some Electronics engineer. At home it's her and Henriette Combs , who also is in real estate, with her own investment business.Katharine Look is a [place], though not currently practicing    ADVANCED DIRECTIVES:  [name] is Chief Technology Officer of attorney   HEALTH MAINTENANCE: Social History   Tobacco Use   Smoking status: Former    Packs/day: 1.00    Years: 9.00    Pack years: 9.00    Types: Cigarettes    Quit date: 06/16/1983    Years since quitting: 37.9   Smokeless tobacco: Never  Vaping Use   Vaping Use: Never used  Substance Use Topics   Alcohol use: Yes    Alcohol/week: 2.0 standard drinks    Types: 2 Glasses of wine per week    Comment: Twice a week   Drug use: No     Colonoscopy:  20/15, [place] she knee  PAP: up-to-date/[facility]  Bone density: October 2016; normal  Lipid panel:  Allergies  Allergen Reactions   Levofloxacin Other (See Comments)    Affected tendons, severe pain and swelling   Tetracyclines & Related Swelling    tongue swelling and peeling    Other Diarrhea, Rash and Other (See Comments)    Crabs - diarrhea. Bay leaves - rash in her mouth   Ciprofloxacin     Tendon problems    Flonase [Fluticasone]     Nasal spray cause nose bleeds   Ibuprofen     Gets mouth ulcers if she takes for more than 3 days; Aleve okay    Nitrofurantoin Monohyd Macro Hives  Pravastatin     Muscle aches, rash, constipation on 28m every other day dosing   Rosuvastatin     Muscle and joint pain on 137mdaily dosing   Scopolamine     "Headache and redness of eyes"    Current Outpatient Medications  Medication Sig Dispense Refill   Alirocumab (PRALUENT) 75 MG/ML SOAJ Inject 1 pen into the skin every 14 (fourteen) days. (Patient not taking: Reported on 04/04/2021) 2 mL 11   Calcium Carbonate-Vitamin D 600-400 MG-UNIT tablet 2 tablets     levothyroxine (SYNTHROID) 112 MCG tablet      oxyCODONE (OXY IR/ROXICODONE) 5 MG immediate release tablet Take 1 tablet (5 mg total) by mouth every 6 (six) hours as needed. 10 tablet 0   Current Facility-Administered Medications  Medication Dose Route Frequency Provider Last Rate Last Admin   0.9 %  sodium chloride infusion   Intravenous PRN ThEileen StanfordPA-C        OBJECTIVE: White woman in no acute distress  Vitals:   05/21/21 0910  BP: 118/76  Pulse: 85  Resp: 18  Temp: 97.8 F (36.6 C)  SpO2: 98%       Body mass index is 32.96 kg/m.    ECOG FS:1 - Symptomatic but completely ambulatory  Sclerae unicteric, EOMs intact Wearing a mask No cervical or supraclavicular adenopathy Lungs no rales or rhonchi Heart regular rate and rhythm Abd soft, obese, nontender, positive bowel sounds MSK no focal spinal tenderness, no upper extremity lymphedema Neuro: nonfocal, well oriented, appropriate affect Breasts: The right breast is status postlumpectomy and radiation.  There is no evidence of local recurrence.  The left breast and both axillae are benign.   RESULTS: No results found.   CMP      Component Value Date/Time   NA 142 05/21/2021 0855   K 4.3 05/21/2021 0855   CL 108 05/21/2021 0855   CO2 22 05/21/2021 0855   GLUCOSE 106 (H) 05/21/2021 0855   BUN 22 05/21/2021 0855   CREATININE 1.23 (H) 05/21/2021 0855   CREATININE 1.28 (H) 01/26/2020 1220   CALCIUM 9.5 05/21/2021 0855   PROT 6.8 05/21/2021 0855   ALBUMIN 4.2 05/21/2021 0855   AST 18 05/21/2021 0855   AST 14 (L) 01/26/2020 1220   ALT 19 05/21/2021 0855   ALT 15 01/26/2020 1220   ALKPHOS 79 05/21/2021 0855   BILITOT 1.0 05/21/2021 0855   BILITOT 1.0 01/26/2020 1220   GFRNONAA 46 (L) 05/21/2021 0855   GFRNONAA 41 (L) 01/26/2020 1220   GFRAA 51 (L) 03/01/2020 1447   GFRAA 47 (L) 01/26/2020 1220    INo results found for: SPEP, UPEP  Lab Results  Component Value Date   WBC 2.7 (L) 02/04/2021   NEUTROABS 1.4 (L) 02/04/2021   HGB 13.5 02/04/2021   HCT 38.7 02/04/2021   MCV 86.8 02/04/2021   PLT 74 (L) 02/04/2021      Chemistry      Component Value Date/Time   NA 142 05/21/2021 0855   K 4.3 05/21/2021 0855   CL 108 05/21/2021 0855   CO2 22 05/21/2021 0855   BUN 22 05/21/2021 0855   CREATININE 1.23 (H) 05/21/2021 0855   CREATININE 1.28 (H) 01/26/2020 1220      Component Value Date/Time   CALCIUM 9.5 05/21/2021 0855   ALKPHOS 79 05/21/2021 0855   AST 18 05/21/2021 0855   AST 14 (L) 01/26/2020 1220   ALT 19 05/21/2021 0855   ALT 15 01/26/2020 1220  BILITOT 1.0 05/21/2021 0855   BILITOT 1.0 01/26/2020 1220       Lab Results  Component Value Date   LABCA2 22 12/04/2008    No components found for: [id number]  No results for input(s): INR in the last 168 hours.  Urinalysis    Component Value Date/Time   COLORURINE YELLOW 05/20/2015 1120   APPEARANCEUR CLEAR 05/20/2015 1120   LABSPEC 1.009 05/20/2015 1120   PHURINE 6.5 05/20/2015 1120   GLUCOSEU NEGATIVE 05/20/2015 1120   HGBUR NEGATIVE 05/20/2015 1120   BILIRUBINUR NEGATIVE 05/20/2015 1120   BILIRUBINUR neg 12/19/2014 1427    KETONESUR NEGATIVE 05/20/2015 1120   PROTEINUR NEGATIVE 05/20/2015 1120   UROBILINOGEN 0.2 12/19/2014 1427   NITRITE NEGATIVE 05/20/2015 1120   LEUKOCYTESUR NEGATIVE 05/20/2015 1120    STUDIES: No results found.   ASSESSMENT: 75 y.o. [place] woman  (1) status post right lumpectomy April 2005 for a pT1a pN0, stage IA invasive breast cancer, estrogen and progesterone receptor positive, HER-2 negative, in the setting of DCIS;   (a) did not receive radiation  (b) status post anastrozole for 5 years completed June 2010  (2) status post right breast upper outer quadrant biopsy 12/21/2014 for ductal carcinoma in situ, grade 2 or 3, estrogen and progesterone receptor positive.  (3) Status post right lumpectomy 02/11/2015 showing ductal carcinoma in situ, intermediate grade, measuring 0.9 cm, with close but negative margins  (4) adjuvant radiation:   03/07/2015-04/04/2015    Right breast/ 42.5 Gy at 2.5 Gy per fraction x 17 fractions.   Right breast boost/ 7.5 Gy at 2.5 Gy per fraction x 3 fractions  (5) genetic testing 01/14/2015 through the [facility] Panel offered by [facility] [name], MD) showed no deleterious mutations in ATM, BARD1, BRCA1, BRCA2, BRIP1, CDH1, CHEK2, FANCC, MLH1, MSH2, MSH6, NBN, PALB2, PMS2, PTEN, RAD51C, RAD51D, TP53, and XRCC2.  This panel also includes deletion/duplication analysis (without sequencing) for one gene, EPCAM.   (6) resumed anastrozole 07/06/2014, discontinued March 2017 with arthralgias and myalgias developing   (a) started letrozole 10/14/2015--discontinued after a brief course secondary to side effects  (b) Bone density 03/29/2015 was normal, with the lowest T score at 0.4  (7) additional genetics testing 07/23/2020 on the [facility] Multi-Cancer Panel + Pulmonary Fibrosis Panel found no deleterious mutations in ABCA3, ACD, AIP, ALK, APC*, ATM*, AXIN2, BAP1, BARD1, BLM, BMPR1A, BRCA1, BRCA2, BRIP1, CASR, CDC73, CDH1,  CDK4, CDKN1B, CDKN1C, CDKN2A (p14ARF), CDKN2A (p16INK4a), CEBPA, CHEK2, CTC1, CTNNA1, DICER1*, DIS3L2*, DKC1, EGFR, EPCAM*, FH*, FLCN, GATA2, GPC3*, GREM1*, HOXB13, HRAS, KIT, MAX*, MEN1*, MET*, MITF, MLH1*, MSH2*, MSH3*, MSH6*, MUTYH, NAF1, NBN, NF1*, NF2, NHP2, NOP10, NTHL1, PALB2, PARN, PDGFRA, PHOX2B*, PMS2*, POLD1*, POLE, POT1, PRKAR1A, PTCH1, PTEN*, RAD50, RAD51C, RAD51D, RB1*, RECQL4*, RET, RTEL1, RUNX1, SDHA*, SDHAF2, SDHB, SDHC*, SDHD, SFTPC, SMAD4, SMARCA4, SMARCB1, SMARCE1, STK11, SUFU, TERC, TERT, TINF2, TMEM127, TP53, TSC1*, TSC2, USB1, VHL, WRAP53, WRN*, WT1.  (a) VUS in ALK identified called [id number].   (8) work-up for lymphadenopathy, likely sarcoidosis  (A) chest CT obtained to follow nonspecific pulmonary nodules 01/26/2020 shows left axillary and left celiac adenopathy.   (i)  HIV screen 03/01/2020 nonreactive, CRP was 1.1, angiotensin-converting enzyme was 56 (normal) with repeat 04/04/2021 mildly elevated at 71  (B) PET scan 02/08/2020 shows generalized lymphadenopathy, which is hypermetabolic, together with splenic enlargement  (C) right inguinal lymph node biopsy 77/41/2878 showed follicular hyperplasia and epithelioid granulomas, no morphologic or immunophenotypic evidence of lymphoma.  This was read as most consistent with a reactive process  not excluding sarcoidosis  (D) chest CT scan 08/02/2020 again shows left axillary subpectoral celiac and retroperitoneal adenopathy as well as multiple very small scattered pulmonary nodules  (E) left axillary lymph node biopsy 11/27/2020 shows an atypical lymphoid proliferation with no B-cell monoclonal component.  (F) left axillary lymph node excision 02/18/2021 showed florid reactive lymphoid hyperplasia with no evidence of a lymphoproliferative process and no metastatic malignancy.  Sarcoidosis was not excluded nor was infection (leishmaniasis, toxoplasmosis etc.).  (G) infectious work-up under Dr. Tommy Medal October 2022 including  QuantiFERON, histoplasma blastomyces and cryptococcal antigens and serologies for toxoplasmosis, RPR and CMV and EBV titers with PCR testing for bacteria fungi and mycobacteria showed no evidence of an infectious etiology.    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  There is no evidence of disease activity.  This is very favorable.  Normally we discharged patients like Elizabeth Ramirez after 5 years.  However because of her lymphadenopathy issue we have continued to see her.  The extensive work-up she has had shows no evidence of malignancy and no evidence of infection.  We are left with a default diagnosis of sarcoidosis.  I am referring her back to pulmonology for long-term follow-up of that issue.  I also gave her written information on that disease.    She would like to continue to see [place] on a yearly basis "just in case" and I have made her a return appointment here in September, since she usually has her mammography in the summer  She knows to call for any other issue that may develop before then.  Total encounter time 25 minutes.*   Osamu Olguin, Virgie Dad, MD  05/21/21 9:39 AM Medical Oncology and Hematology [facility] [place], [place] [address] Tel. [phone]    Fax. [phone]    I, Wilburn Mylar, am acting as scribe for Dr. Virgie Dad. Kj Imbert.  I, Lurline Del MD, have reviewed the above documentation for accuracy and completeness, and I agree with the above.   *Total Encounter Time as defined by the Centers for Medicare and Medicaid Services includes, in addition to the face-to-face time of a patient visit (documented in the note above) non-face-to-face time: obtaining and reviewing outside history, ordering and reviewing medications, tests or procedures, care coordination (communications with other health care professionals or caregivers) and  documentation in the medical record.

## 2021-05-21 ENCOUNTER — Inpatient Hospital Stay (HOSPITAL_BASED_OUTPATIENT_CLINIC_OR_DEPARTMENT_OTHER): Payer: Medicare Other | Admitting: Oncology

## 2021-05-21 ENCOUNTER — Inpatient Hospital Stay: Payer: Medicare Other | Attending: Oncology

## 2021-05-21 ENCOUNTER — Other Ambulatory Visit: Payer: Self-pay

## 2021-05-21 VITALS — BP 118/76 | HR 85 | Temp 97.8°F | Resp 18 | Ht 64.0 in | Wt 192.0 lb

## 2021-05-21 DIAGNOSIS — C50411 Malignant neoplasm of upper-outer quadrant of right female breast: Secondary | ICD-10-CM

## 2021-05-21 DIAGNOSIS — D869 Sarcoidosis, unspecified: Secondary | ICD-10-CM | POA: Diagnosis not present

## 2021-05-21 DIAGNOSIS — Z170 Estrogen receptor positive status [ER+]: Secondary | ICD-10-CM | POA: Diagnosis not present

## 2021-05-21 DIAGNOSIS — Z87891 Personal history of nicotine dependence: Secondary | ICD-10-CM | POA: Insufficient documentation

## 2021-05-21 DIAGNOSIS — D479 Neoplasm of uncertain behavior of lymphoid, hematopoietic and related tissue, unspecified: Secondary | ICD-10-CM

## 2021-05-21 DIAGNOSIS — Z86000 Personal history of in-situ neoplasm of breast: Secondary | ICD-10-CM | POA: Diagnosis not present

## 2021-05-21 DIAGNOSIS — Z79899 Other long term (current) drug therapy: Secondary | ICD-10-CM | POA: Insufficient documentation

## 2021-05-21 LAB — COMPREHENSIVE METABOLIC PANEL
ALT: 19 U/L (ref 0–44)
AST: 18 U/L (ref 15–41)
Albumin: 4.2 g/dL (ref 3.5–5.0)
Alkaline Phosphatase: 79 U/L (ref 38–126)
Anion gap: 12 (ref 5–15)
BUN: 22 mg/dL (ref 8–23)
CO2: 22 mmol/L (ref 22–32)
Calcium: 9.5 mg/dL (ref 8.9–10.3)
Chloride: 108 mmol/L (ref 98–111)
Creatinine, Ser: 1.23 mg/dL — ABNORMAL HIGH (ref 0.44–1.00)
GFR, Estimated: 46 mL/min — ABNORMAL LOW (ref >60)
Glucose, Bld: 106 mg/dL — ABNORMAL HIGH (ref 70–99)
Potassium: 4.3 mmol/L (ref 3.5–5.1)
Sodium: 142 mmol/L (ref 135–145)
Total Bilirubin: 1 mg/dL (ref 0.3–1.2)
Total Protein: 6.8 g/dL (ref 6.5–8.1)

## 2021-05-23 ENCOUNTER — Encounter: Payer: Self-pay | Admitting: Internal Medicine

## 2021-05-23 ENCOUNTER — Ambulatory Visit (INDEPENDENT_AMBULATORY_CARE_PROVIDER_SITE_OTHER): Payer: Medicare Other | Admitting: Internal Medicine

## 2021-05-23 ENCOUNTER — Other Ambulatory Visit: Payer: Self-pay

## 2021-05-23 VITALS — BP 122/76 | HR 68 | Temp 98.0°F | Ht 64.0 in | Wt 191.6 lb

## 2021-05-23 DIAGNOSIS — R590 Localized enlarged lymph nodes: Secondary | ICD-10-CM

## 2021-05-23 DIAGNOSIS — I2510 Atherosclerotic heart disease of native coronary artery without angina pectoris: Secondary | ICD-10-CM

## 2021-05-23 DIAGNOSIS — R591 Generalized enlarged lymph nodes: Secondary | ICD-10-CM

## 2021-05-23 DIAGNOSIS — Z836 Family history of other diseases of the respiratory system: Secondary | ICD-10-CM

## 2021-05-23 DIAGNOSIS — R911 Solitary pulmonary nodule: Secondary | ICD-10-CM | POA: Diagnosis not present

## 2021-05-23 DIAGNOSIS — Z853 Personal history of malignant neoplasm of breast: Secondary | ICD-10-CM | POA: Diagnosis not present

## 2021-05-23 NOTE — Patient Instructions (Addendum)
Axillary lymphadenopathy Lymphadenopathy Granulomatous adenopathy - right inguinal node sep 2021 and pre-granuloma of left axilla Sept 2022 History of breast cancer  -There is enlargement of the left axillary lymph node in Aug 2022 compared to August 2021. This could all be granuloma as evidenced by your right inguinal biopsy in September 2021 and left axillary node biopsy Sept 2022   = clinical concerns is SARCOID but ASYMPTOMATIC  - Noted strong adverse reaaction to steroids in self and family  Plan - -At this point in time support and agree  we will hold off on any steroids or methotrexate for sarcoidosis consideration   Pulmonary nodules - multiple   -CT scan of the chest February 2022 shows resolution of the left lower lobe lymph node, persistence of the left upper lobe lymph node and multiple very tiny other nodules that are new compared to August 2021. [place] new 4-5 mm nodules in Right upper lobe and right lower lobe in Aug 202   Plan - repeat CT chest without contrast  in August 2023  Family history of pulmonary fibrosis  CT scan of the chest February 2022 and Augst 2023 shows no evidence of pulmonary fibrosis -Genetic testing for pulmonary fibrosis is noncontributory in January 2022 -Glad you are feeling well without any shortness of breath  Plan -Expectant follow-up  Coronary artery calcification seen on CT scan February 2022 and Aug 2022  -Did you see cardiology or have stress test?  Plan -If not, please discuss with PCP Cari Caraway, MD    Follow-up -Video visit in 4-5  months  - Face to face in Aug 2023 after CT chest; 15 min visit

## 2021-05-23 NOTE — Progress Notes (Signed)
OV 05/06/2020  Subjective:  Patient ID: Elizabeth Ramirez, female , DOB: June 09, 1946 , age 75 y.o. , MRN: [id number] , ADDRESS: [place] [id number] PCP Elizabeth Caraway, MD Patient Care Team: Elizabeth Caraway, MD as PCP - General (Family Medicine) Elizabeth Ramirez, Elizabeth Dad, MD as Consulting Physician (Oncology) Elizabeth Seltzer, MD (Inactive) as Consulting Physician (General Surgery) Elizabeth Charleston, MD as Consulting Physician (Obstetrics and Gynecology) Elizabeth Britain, MD as Consulting Physician (Orthopedic Surgery)  This Provider for this visit: Treatment Team:  Attending Provider: Brand Males, MD    05/06/2020 -   Chief Complaint  Patient presents with   Consult    Pt is being referred by Dr. Jana Ramirez for possible sarcoidosis. Pt denies any complaints of cough, SOB, or CP.     HPI Elizabeth Ramirez 75 y.o. -  HPI 05/06/2020   -New consult evaluation.  Referred by Dr. Jana Ramirez  75 year old female who is dealing with left knee issue and is awaiting a left knee replacement May 20, 2020 with Dr. Reynaldo Ramirez.  She is under surveillance for breast cancer and right when she is going to be discharged from long-term follow-up lymphadenopathy was described and discovered.  Started off with a lung nodule discovery in the left lower lobe 11 mm along with axillary node discovery on a CT chest.  This resulted in PET scan that is described below.  There is diffuse uptake particularly in the abdomen and the liver and the splenic areas.  She also had a inguinal node.  This was then biopsied in September 2021 and showed granuloma.  Sarcoidosis under consideration but she has granulomatous adenitis.  She basically has no respiratory symptoms.  She been referred here for evaluation of this.  There is no hemoptysis or shortness of breath or wheezing or cough.  There is no weight loss or fever or chills or any B symptoms.  Currently she is most concerned about getting and going  through her left knee replacement.  Rest of the history is documented as a copy and paste from Dr. Jana Ramirez  She is concerned about these findings.  Particularly she reports a strong family so pulmonary fibrosis in her Ramirez and a paternal aunt and a paternal cousin who is the niece of the paternal aunt and her Ramirez.   - IMPRESSION: CT chest January 26, 2020 11 x 7 mm irregular nodule in the superior segment left lower lobe, new. This is indeterminate but raises concern for primary bronchogenic neoplasm or metastasis. This is just at the size threshold for PET sensitivity. Consider PET-CT for further evaluation.   Mildly progressive left axillary/subpectoral nodes measuring up to 11 mm short axis. Contralateral nodal metastases would be unusual. Reactive nodes or low-grade lymphoproliferative disorder are also possible. This can be evaluated at the time of PET-CT.   10 mm short axis left celiac axis node, also minimally progressive, warranting attention at the time of PET-CT.   Aortic Atherosclerosis (ICD10-I70.0) and Emphysema (ICD10-J43.9).     Electronically Signed   By: Elizabeth Ramirez M.D.   On: 01/26/2020 16:22   PET scan February 08, 2020  IMPRESSION: 1. Generalized nodal enlargement and or hypermetabolic lymph nodes with splenic enlargement and increased metabolic activity findings are most suggestive of lymphoproliferative disorder/lymphoma. RIGHT groin lymph node with considerable FDG uptake may be the best target for sampling. Would avoid LEFT axillary lymph nodes given that the patient reportedly has had recent COVID-19 vaccination which can be confounding on PET  scan. Correlate with side of vaccine administration. Metastatic disease is also considered though would be unusual in the setting of breast cancer with pelvic node predominant disease and splenic enlargement with mild hypermetabolism. 2. Small lymph nodes in the neck and potential RIGHT parotid lesion, this  could be related but would suggest dedicated assessment of the parotid gland for further evaluation, this could be performed with CT or ultrasound as an initial means in further evaluating this area. 3. Small lung nodule in the LEFT lower lobe is nonspecific in terms of FDG uptake suggesting indolent process. Morphologic features raising the question of bronchogenic neoplasm as outlined previously. 4. Mild increased FDG uptake in the RIGHT breast may relate to recent biopsy at the site of previous surgery, refer to biopsy results and mammography for further information. 5. Lobular hepatic contours could also be seen in the setting of liver disease, correlate with any clinical or laboratory evidence of liver disease. Liver disease could be an alternative explanation for splenic enlargement. 6. Activation of brown fat along the spine and potentially in the area of the occipital region and posterior neck.     Electronically Signed   By: [name] M.D.   On: 02/08/2020 16:59   A. LYMPH NODE, RIGHT INGUINAL, BIOPSY: February 23, 2020 -  Lymph node with follicular hyperplasia, scattered giant cells and  epithelioid granulomas  -  No morphologic or immunophenotypic evidence of lymphoma  -  See comment   Famil hx Elizabeth Ramirez father died at the age of 93 from pulmonary fibrosis.  A paternal aunt also had pulmonary fibrosis.  A paternal cousin also has pulmonary fibrosis.  Her mother died at age 21 with severe dementia. The patient had one brother no sisters. Her maternal grandmother died from stomach cancer the age of 58. The patient's mother had a half sister with ovarian cancer.    SOCIAL HISTORY:  Works as a Cabin crew, Publishing copy in renovations. She retired from [employer] as of 2018 and is now doing some Electronics engineer. At home it's her and Elizabeth Ramirez , who also is in real estate, with her own investment business..    GENETIC clinic 07/24/20  GENETIC TEST RESULTS: Genetic testing  reported out on 07/23/2020 through the [facility] Multi-Cancer Panel + Pulmonary Fibrosis panel found no pathogenic mutations.    The Multi-Cancer Panel + Pulmonary Fibrosis panel offered by [facility] includes sequencing and/or deletion duplication testing of the following 97 genes: ABCA3, ACD, AIP, ALK, APC*, ATM*, AXIN2, BAP1, BARD1, BLM, BMPR1A, BRCA1, BRCA2, BRIP1, CASR, CDC73, CDH1, CDK4, CDKN1B, CDKN1C, CDKN2A (p14ARF), CDKN2A (p16INK4a), CEBPA, CHEK2, CTC1, CTNNA1, DICER1*, DIS3L2*, DKC1, EGFR, EPCAM*, FH*, FLCN, GATA2, GPC3*, GREM1*, HOXB13, HRAS, KIT, MAX*, MEN1*, MET*, MITF, MLH1*, MSH2*, MSH3*, MSH6*, MUTYH, NAF1, NBN, NF1*, NF2, NHP2, NOP10, NTHL1, PALB2, PARN, PDGFRA, PHOX2B*, PMS2*, POLD1*, POLE, POT1, PRKAR1A, PTCH1, PTEN*, RAD50, RAD51C, RAD51D, RB1*, RECQL4*, RET, RTEL1, RUNX1, SDHA*, SDHAF2, SDHB, SDHC*, SDHD, SFTPC, SMAD4, SMARCA4, SMARCB1, SMARCE1, STK11, SUFU, TERC, TERT, TINF2, TMEM127, TP53, TSC1*, TSC2, USB1, VHL, WRAP53, WRN*, WT1    OV 08/06/2020  Subjective:  Patient ID: Elizabeth Ramirez, female , DOB: February 04, 1946 , age 80 y.o. , MRN: [id number] , ADDRESS: [address] [place] [id number] PCP Elizabeth Caraway, MD Patient Care Team: Elizabeth Caraway, MD as PCP - General (Family Medicine) Elizabeth Ramirez, Elizabeth Dad, MD as Consulting Physician (Oncology) Elizabeth Seltzer, MD (Inactive) as Consulting Physician (General Surgery) Elizabeth Charleston, MD as Consulting Physician (Obstetrics and Gynecology) Elizabeth Britain, MD as Consulting Physician (  Orthopedic Surgery)  This Provider for this visit: Treatment Team:  Attending Provider: Brand Males, MD    08/06/2020 -   Chief Complaint  Patient presents with   Follow-up    Doing well, no issues   Multiple pulmonary nodules in the setting of breast cancer and also family history of pulmonary fibrosis and also right inguinal lymphadenopathy September 2021 there is granuloma  HPI Elizabeth Ramirez 75 y.o. -returns for follow-up.  In the interim we did testing for granuloma that includes QuantiFERON gold angiotensin-converting enzyme. These are normal. Immunoglobulins are normal. In the interim she has had a left knee surgery. Her mobility has improved. Her shortness of breath is not there. She feels happier. She had CT scan of the chest in February 2022 that I personally visualized with her. This is for multiple pulmonary nodules. The concerning left lower lobe nodule is resolved. The small left upper lobe nodule persists. There is also multiple 5 mm nodules. These are all n in comparison to August 2021. She has a history of breast cancer. Her surgeon Dr. Lucia Gaskins is retired. She wants to see Dr. Donne Hazel. She otherwise feels good. She had genetic testing for pulmonary fibrosis and this was reviewed with a genetics counselor by email discussion. In addition I reviewed the note and this was noncontributory. It is documented above. There is no coughing or wheezing. She is somewhat reluctant to do daily steroids.  Other findings on the CT scan of the chest include coronary artery calcification which I am addressing for the first time.  She has air trapping but she is asymptomatic.  She has pulmonary artery enlargement but she is asymptomatic. CT Chest data  CT Chest High Resolution  Result Date: 08/02/2020 CLINICAL DATA:  Follow-up lung nodule, history of right breast cancer EXAM: CT CHEST WITHOUT CONTRAST TECHNIQUE: Multidetector CT imaging of the chest was performed following the standard protocol without intravenous contrast. High resolution imaging of the lungs, as well as inspiratory and expiratory imaging, was performed. COMPARISON:  CT chest, 01/26/2020, PET-CT, 02/08/2020 FINDINGS: Cardiovascular: Left coronary artery calcifications. Normal heart size. No pericardial effusion. Enlargement of the main pulmonary artery measuring up to 3.6 cm. Mediastinum/Nodes: Left axillary and subpectoral lymph nodes are enlarged compared to prior  examination, an index axillary node measuring 2.1 x 1.6 cm, previously 1.7 x 1.1 cm when measured similarly (series 2, image 21). Thyroid gland, trachea, and esophagus demonstrate no significant findings. Lungs/Pleura: Interval resolution of previously seen spiculated nodule of the superior segment left lower lobe (series 4, image 124). Unchanged spiculated nodule in the anterior left upper lobe measuring 5 mm (series 4, image 115). There are multiple new scattered small pulmonary nodules, for example a 5 mm nodule of the peripheral left upper lobe (series 4, image 150) and a 3 mm pulmonary nodule of the left lung base (series 4, image 239). Minimal subpleural radiation fibrosis of the anterior right lung. Mild, lobular air trapping on expiratory phase imaging. No pleural effusion or pneumothorax. Upper Abdomen: No acute abnormality. Multiple celiac axis and retroperitoneal lymph nodes in the partially included upper abdomen are increased in size compared to prior examination, an index left retroperitoneal node measuring 1.2 x 1.0 cm, previously 0.6 x 0.6 cm (series 2, image 166). Musculoskeletal: No chest wall mass or suspicious bone lesions identified. Postoperative findings of prior right lumpectomy. IMPRESSION: 1. Interval resolution of previously seen spiculated nodule of the superior segment left lower lobe, consistent with resolved nonspecific infection or inflammation. 2. Unchanged  spiculated nodule in the anterior left upper lobe measuring 5 mm, likely benign given established stability. 3. There are multiple new scattered small pulmonary nodules, measuring 5 mm or smaller. These are nonspecific and statistically most likely infectious or inflammatory, although given history of malignancy, metastatic disease is not excluded. 4. Interval enlargement of left axillary, subpectoral, celiac axis, and retroperitoneal lymph nodes, which remain generally concerning for lymphoma or metastatic disease. 5. Mild,  lobular air trapping on expiratory phase imaging, consistent with small airways disease. 6. Enlargement of the main pulmonary artery, as can be seen in pulmonary hypertension. 7. Coronary artery disease. Electronically Signed   By: Eddie Candle M.D.   On: 08/02/2020 15:25      PFT  OV 01/31/2021  Subjective:  Patient ID: Elizabeth Ramirez, female , DOB: 1945-07-07 , age 85 y.o. , MRN: [id number] , AD[address]ESS: [address] [place] [id number] PCP Elizabeth Caraway, MD Patient Care Team: Elizabeth Caraway, MD as PCP - General (Family Medicine) Elizabeth Ramirez, Elizabeth Dad, MD as Consulting Physician (Oncology) Elizabeth Seltzer, MD (Inactive) as Consulting Physician (General Surgery) Elizabeth Charleston, MD as Consulting Physician (Obstetrics and Gynecology) Elizabeth Britain, MD as Consulting Physician (Orthopedic Surgery) Rolm Bookbinder, MD as Consulting Physician (General Surgery)  This Provider for this visit: Treatment Team:  Attending Provider: Brand Males, MD    01/31/2021 -   Chief Complaint  Patient presents with   Follow-up    Doing well, no issues     HPI Elizabeth Ramirez 75 y.o. -p presents for follow-up.  And CT scan of the chest in February 2022 showed resolution of the left lower lobe lymph node from 2021 August but she had numerous other multiple tiny nodules including left upper lobe lymph nodes.  We ordered a CT scan of the chest today but the results are pending.  Upon my personal visualization it looks okay.    Of note she did have an enlarged left axillary lymph node and I sent her to surgeon .  I reviewed the record.  And once end of June 2022 she had a needle biopsy that showed abnormal T cells.  She is now scheduled for excision biopsy on 02/18/2021.  In the interim she will see Dr. Jana Ramirez her oncologist.  Also referred her to cardiology for coronary artery calcification but I do not know if she is seeing cardiology.  She is denying any chest pain or shortness of  breath and overall she is reporting good health.  No results found.    Background history from the oncologist -in September 2021   (1) status post right lumpectomy April 2005 for a pT1a pN0, stage IA invasive breast cancer, estrogen and progesterone receptor positive, HER-2 negative, in the setting of DCIS;              (a) did not receive radiation             (b) status post anastrozole for 5 years completed June 2010   (2) status post right breast upper outer quadrant biopsy 12/21/2014 for ductal carcinoma in situ, grade 2 or 3, estrogen and progesterone receptor positive.   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   Right breast boost/ 7.5 Gy at 2.5 Gy per fraction x 3 fractions   (  5) genetic testing 01/14/2015 through the [place] offered by [facility] [name], MD) showed no deleterious mutations in ATM, BARD1, BRCA1, BRCA2, BRIP1, CDH1, CHEK2, FANCC, MLH1, MSH2, MSH6, NBN, PALB2, PMS2, PTEN, RAD51C, RAD51D, TP53, and XRCC2.  This panel also includes deletion/duplication analysis (without sequencing) for one gene, EPCAM.    (6) resumed anastrozole 07/06/2014, discontinued March 2017 with arthralgias and myalgias developing              (a) started letrozole 10/14/2015--discontinued after a brief course secondary to side effects             (b) Bone density 03/29/2015 was normal, with the lowest T score at 0.4    [place] is now just over 5 years out from definitive surgery for her breast cancer with no evidence of disease recurrence.  This is favorable.   We were preparing to discharge her from follow-up when she was found to have diffuse lymphadenopathy.  We obtained a lymph node biopsy which shows no evidence of breast  cancer or lymphoma.  There are granulomas but no evidence of tuberculosis or other chronic infection.  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  8) possible lymphoproliferative disease:               (A) continue CT obtained to follow nonspecific pulmonary nodules 01/26/2020 shows left axillary and left celiac adenopathy.             (B) PET scan 02/08/2020 shows generalized lymphadenopathy, which is hypermetabolic, together with splenic enlargement             (C) right inguinal lymph node biopsy 32/35/5732 showed follicular hyperplasia and epithelioid granulomas, no morphologic or immunophenotypic evidence of lymphoma.  This was read as most consistent with a reactive process without excluding sarcoidosis  (D) chest CT scan 08/02/2020 again shows left axillary subpectoral celiac and retroperitoneal adenopathy as well as multiple very small scattered pulmonary nodules             (E) left axillary lymph node biopsy 11/27/2020 shows an atypical lymphoid proliferation with no B-cell monoclonal component.   OV 05/23/2021  Subjective:  Patient ID: Elizabeth Ramirez, female , DOB: [birth year] , age 37 y.o. , MRN: [id number] , AD[address]ESS: [address] [id number] PCP Elizabeth Caraway, MD Patient Care Team: Elizabeth Caraway, MD as PCP - General (Family Medicine) Elizabeth Ramirez, Elizabeth Dad, MD as Consulting Physician (Oncology) Elizabeth Seltzer, MD (Inactive) as Consulting Physician (General Surgery) Elizabeth Charleston, MD as Consulting Physician (Obstetrics and Gynecology) Elizabeth Britain, MD as Consulting Physician (Orthopedic Surgery) Rolm Bookbinder, MD as Consulting Physician (General Surgery)  This Provider for this visit: Treatment Team:  Attending Provider: Brand Males, MD    05/23/2021 -   Chief Complaint  Patient presents with   Follow-up    Pt states she has been doing okay since last visit and denies any complaints.   Multiple pulmonary nodules in the setting  of breast cancer and also family history of pulmonary fibrosis and also right inguinal lymphadenopathy September 2021 there is granuloma and left axillary node pre granuloma September 2022  HPI Elizabeth Ramirez 75 y.o. -returns for follow-up.  Last seen in August 2022.  After that she had a CT scan of the chest and on the CT scan her left axillary lymph node had enlarged even further.  She also had interval development of new right upper lobe micro pulmonary nodules less than 5 mm.  Therefore she ended up getting left axillary lymph node biopsy.  The report is below.  She has lymphocyte proliferation with epithelioid cells.  Almost a pre-granuloma experience.  She did see infectious diseases Dr. Drucilla Schmidt.  After that she saw Dr. Jana Ramirez who is retiring soon.  It was her last visit with him.  She is very sad that he is retiring.  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  It is not malignancy.  Therefore she has been asked to reestablish care.  In terms of her symptoms she is intentionally lost weight because of diet.  She continues to be asymptomatic without any respiratory issues.  No shortness of breath no cough no hemoptysis no chills no fever.  She feels good.  Overall she prefers an expectant approach.  She says there is a strong family history of adverse reactions to steroids.  Family member got admitted to psychiatric hospital.  She herself has had anxiety with steroids.  She does not want to do steroids.  We discussed the possibility that this could be sarcoid.  We discussed the natural history of sarcoid.  Discussed intention to treat being when sarcoid fixed organs such as the cardiac or brain or kidney or liver or progressive in the lung with symptoms.  She is understanding of this.  Discussed methotrexate as an option if steroids do not work or is a contraindication or patient having side effects.  She verbalized understanding.  Currently shared decision making for an  expectant approach.    CT Chest data Augt 2022 since feb 202  IMPRESSION: Stable left upper lobe pulmonary nodule, safely considered benign.  Lungs/Pleura: The previously identified pulmonary nodule within the left upper lobe appears stable, measuring 5 mm at axial image # 67/5. However, multiple additional pulmonary nodules have developed measuring 4-5 mm in size seen within the right upper lobe at axial image # 62/5, and left lower lobe at axial image # 78/5, indeterminate. No confluent pulmonary infiltrate. No pneumothorax or pleural effusion. Central airways are widely patent.   Interval development of multiple new 4-5 mm pulmonary nodules within the right upper lobe and right lower lobe, indeterminate. No follow-up needed if patient is low-risk (and has no known or suspected primary neoplasm). Non-contrast chest CT can be considered in 12 months if patient is high-risk. This recommendation follows the consensus statement: Guidelines for Management of Incidental Pulmonary Nodules Detected on CT Images: From the Fleischner Society 2017; Radiology 2017; 284:228-243.   Progressive pathologic adenopathy within the left axilla. Interval biopsy of the dominant lymph node within the left axilla with biopsy marker placed. Stable left periaortic lymphadenopathy within the visualized upper abdomen.   Mild coronary artery calcification. Morphologic changes in keeping with pulmonary arterial hypertension.   Aortic Atherosclerosis (ICD10-I70.0).     Electronically Signed   By: Fidela Salisbury M.D.   On: 02/02/2021 02:19  No results found.  Axillary bxx 02/18/21  FINAL MICROSCOPIC DIAGNOSIS:   A. LYMPH NODE, LEFT AXILLARY, DISSECTION:  -Florid lymphoid hyperplasia  -See comment   COMMENT:   Sections of all lymph nodes show preservation of the architecture with  open sinuses.  The lymphoid tissue displays florid, primarily follicular  hyperplasia characterized by numerous  variably sized lymphoid follicles  with reactive appearing germinal centers.  Many of the germinal center  show attenuated or absent mantle zones.  Scattered small foci of  monocytoid cells are seen adjacent to some follicles most compatible  with areas of monocytoid B-cell  hyperplasia.  In some areas there is  also paracortical hyperplasia associated with scattered variably sized  clusters of epithelioid histiocytes. No metastatic malignancy is  identified.  Immunohistochemical stains including CD20, CD3, CD10 and  BCL6 2 were performed on a representative block and show that the  lymphoid follicles are positive for CD20 and CD10 and negative for  Bcl-2.  The lymphoid follicles are surrounded by abundant T cells as  seen with CD3.  The overall features are consistent with florid reactive  lymphoid hyperplasia. There is no evidence to suggest a  lymphoproliferative process.  The presence of scattered clusters of  epithelioid histiocytes in association with florid follicular  hyperplasia may be the result of previous biopsy/procedure, sarcoidosis,  or infection such as leishmaniasis, toxoplasmosis, etc.  Clinical and  serologic evaluation is recommended.   PFT  No flowsheet data found.     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), Family history of breast cancer, Family history of multiple myeloma, Family history of ovarian cancer, Family history of pulmonary fibrosis, Family history of stomach cancer, Genetic testing (01/18/2015), Heart murmur, History of kidney stones, Hypothyroid (07/29/2011), Hypothyroidism, Malignant neoplasm of upper-outer quadrant of right breast in female, estrogen receptor positive ([place]) (01/14/2015), Multiple allergies (04/04/2021), 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   reports that she quit smoking about 37 years ago. Her smoking use included cigarettes. She has a 9.00 pack-year smoking history. She has never used smokeless tobacco.  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   BREAST LUMPECTOMY Right 2016   BREAST LUMPECTOMY WITH NEEDLE LOCALIZATION Right 02/11/2015   Procedure: WIRE BRACKETED RIGHT BREAST LUMPECTOMY ;  Surgeon: Elizabeth Seltzer, MD;  Location: [facility];  Service: General;  Laterality: Right;   CATARACT EXTRACTION, BILATERAL  10-05-2017 left ; 11-09-2017 right   Dr Kandy Garrison    colonoscopy with biopsy  12/28/2003   neg   EVACUATION BREAST HEMATOMA Left 03/11/2021   Procedure: LEFT AXILLARY SEROMA DRAINAGE AND DRAIN PLACEMENT;  Surgeon: Rolm Bookbinder, MD;  Location: [facility];  Service: General;  Laterality: Left;   LYMPH NODE DISSECTION Right 02/23/2020   Procedure: right inguinal lymph node excision;  Surgeon: Alphonsa Overall, MD;  Location: [facility];  Service: General;  Laterality: Right;   RADIOACTIVE SEED GUIDED AXILLARY SENTINEL LYMPH NODE Left 02/18/2021   Procedure: RADIOACTIVE SEED GUIDED LEFT AXILLARY LYMPH NODE EXCISION;  Surgeon: Rolm Bookbinder, MD;  Location: [facility];  Service: General;  Laterality: Left;   REVERSE SHOULDER ARTHROPLASTY Left 07/14/2018   Procedure: REVERSE SHOULDER ARTHROPLASTY;  Surgeon: Elizabeth Britain, MD;  Location: [facility];  Service: Orthopedics;  Laterality: Left;  Interscalene Block   TONSILLECTOMY     1954   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   Procedure: RIGHT TOTAL KNEE ARTHROPLASTY;  Surgeon: Gaynelle Arabian, MD;  Location: [facility];  Service: Orthopedics;  Laterality: Right;   TOTAL KNEE ARTHROPLASTY Left 05/20/2020   Procedure: TOTAL KNEE ARTHROPLASTY;  Surgeon: Gaynelle Arabian, MD;  Location: [facility];  Service: Orthopedics;  Laterality: Left;  62mn   WISDOM TOOTH EXTRACTION Bilateral 1965    Allergies  Allergen Reactions   Levofloxacin Other (See Comments)    Affected tendons, severe pain and swelling   Tetracyclines & Related Swelling    tongue swelling and peeling   Other Diarrhea, Rash and Other (See Comments)    Crabs - diarrhea. Bay leaves - rash in her mouth   Ciprofloxacin     Tendon problems    Flonase [Fluticasone]     Nasal spray cause nose bleeds   Ibuprofen     Gets mouth ulcers if she takes for more than 3 days; Aleve okay    Nitrofurantoin Monohyd Macro Hives   Pravastatin     Muscle aches, rash, constipation on 227mevery other day dosing   Rosuvastatin     Muscle and joint pain on 1018maily dosing   Scopolamine     "Headache and redness of eyes"    Immunization History  Administered Date(s) Administered   Fluad Quad(high Dose 65+) 04/04/2021   Influenza Split 03/15/2008, 02/22/2015, 03/16/2020, 04/04/2021   Influenza, High Dose Seasonal PF 03/24/2017, 04/01/2018, 02/09/2019, 02/15/2020   Influenza,inj,Quad PF,6+ Mos 04/06/2012   Influenza,inj,quad, With Preservative 03/06/2014, 03/19/2016, 02/13/2017, 03/15/2018, 03/16/2019   PFIZER(Purple Top)SARS-COV-2 Vaccination 07/07/2019, 07/28/2019, 03/12/2020, 09/25/2020, 03/18/2021   Pneumococcal Conjugate-13 03/14/2015   Pneumococcal Polysaccharide-23 05/23/2010, 03/24/2017   Td 07/30/2003   Tdap 07/29/2009, 05/02/2021   Zoster, Live 07/29/2005, 04/19/2020, 01/17/2021    Family History  Problem Relation Age of Onset   Dementia Mother    Other Mother 81 38    "spot on lung"; secondhand smoke exposure   Pulmonary  fibrosis Father    Diabetes Brother    Thyroid disease Brother    Leukemia Brother 70 26    chronic lymphocytic    Stomach cancer Maternal Grandmother        dx. 30s62sHeart attack Maternal Grandfather    Diabetes Paternal Grandfather    Stroke Paternal Grandfather    Colitis Paternal Grandmother    Parkinson's disease Maternal Aunt    Diabetes Paternal Aunt    Other Paternal Aunt        bowel obstruction   Heart Problems Paternal Uncle    Ovarian cancer Maternal Aunt        dx. 50s40saternal half sister of her mother   Multiple myeloma Maternal Aunt    Multiple myeloma Maternal Aunt 75   Kidney failure Paternal Aunt    Pulmonary fibrosis Paternal Aunt    Stroke Paternal Aunt    Heart Problems Paternal Aunt    Emphysema Paternal Uncle    Heart Problems Paternal Uncle    Lung cancer Paternal Uncle        smoker   Colon cancer Cousin    Breast cancer Cousin        dx. 50s38sPulmonary fibrosis Cousin      Current Outpatient Medications:    Calcium Carbonate-Vitamin D 600-400 MG-UNIT tablet, 2 tablets, Disp: , Rfl:    SYNTHROID 100 MCG tablet, Take 100 mcg by mouth every morning., Disp: , Rfl:   Current Facility-Administered Medications:    0.9 %  sodium chloride infusion, , Intravenous, PRN, ThoAngelena Form PA-C      Objective:   Vitals:   05/23/21 0955  BP: 122/76  Pulse: 68  Temp: 98 F (36.7 C)  TempSrc: Oral  SpO2: 98%  Weight: 191  lb 9.6 oz (86.9 kg)  Height: '5\' 4"'  (1.626 m)    Estimated body mass index is 32.89 kg/m as calculated from the following:   Height as of this encounter: '5\' 4"'  (1.626 m).   Weight as of this encounter: 191 lb 9.6 oz (86.9 kg).  '@WEIGHTCHANGE' @  [employer]   05/23/21 0955  Weight: 191 lb 9.6 oz (86.9 kg)     Physical Exam General: No distress. Looks well Neuro: Alert and Oriented x 3. GCS 15. Speech normal Psych: Pleasant Resp:  Barrel Chest - no.  Wheeze - no, Crackles - no, No overt respiratory  distress CVS: Normal heart sounds. Murmurs - no Ext: Stigmata of Connective Tissue Disease - no HEENT: Normal upper airway. PEERL +. No post nasal drip        Assessment:       ICD-10-CM   1. Axillary lymphadenopathy  R59.0     2. Lymphadenopathy  R59.1     3. Family history of pulmonary fibrosis  Z83.6     4. History of breast cancer  Z85.3     5. 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 This could all be granuloma as evidenced by your right inguinal biopsy in September 2021 and left axillary node biopsy Sept 2022   = clinical concerns is SARCOID but ASYMPTOMATIC  - Noted strong adverse reaaction to steroids in self and family  Plan - -At this point in time support and agree  we will hold off on any steroids or methotrexate for sarcoidosis consideration   Pulmonary nodules - multiple   -CT scan of the chest February 2022 shows resolution of the left lower lobe lymph node, persistence of the left upper lobe lymph node and multiple very tiny other nodules that are new compared to August 2021. Alsso new 4-5 mm nodules in Right upper lobe and right lower lobe in Aug 202   Plan - repeat CT chest without contrast  in August 2023  Family history of pulmonary fibrosis  CT scan of the chest February 2022 and Augst 2023 shows no evidence of pulmonary fibrosis -Genetic testing for pulmonary fibrosis is noncontributory in January 2022 -Glad you are feeling well without any shortness of breath  Plan -Expectant follow-up  Coronary artery calcification seen on CT scan February 2022 and Aug 2022  -Did you see cardiology or have stress test?  Plan -If not, please discuss with PCP Elizabeth Caraway, MD    Follow-up -Video visit in 4-5  months  - Face to face in Aug 2023 after CT chest; 15 min visit    SIGNATURE    Dr. Brand Ramirez, M.D., F.C.C.P,  Pulmonary and Critical Care Medicine Staff Physician, [place] Director - Interstitial Lung Disease  Program  [facility] [place], [place], [address]  Pager: [phone], If no answer or between  15:00h - 7:00h: call [phone] Telephone: [phone]  10:37 AM 05/23/2021

## 2021-05-30 DIAGNOSIS — Z96652 Presence of left artificial knee joint: Secondary | ICD-10-CM | POA: Diagnosis not present

## 2021-07-18 DIAGNOSIS — E039 Hypothyroidism, unspecified: Secondary | ICD-10-CM | POA: Diagnosis not present

## 2021-08-27 DIAGNOSIS — Z20822 Contact with and (suspected) exposure to covid-19: Secondary | ICD-10-CM | POA: Diagnosis not present

## 2021-09-29 ENCOUNTER — Telehealth: Payer: Medicare Other | Admitting: Internal Medicine

## 2021-10-01 ENCOUNTER — Encounter (HOSPITAL_COMMUNITY): Payer: Self-pay

## 2021-10-06 DIAGNOSIS — E039 Hypothyroidism, unspecified: Secondary | ICD-10-CM | POA: Diagnosis not present

## 2021-10-07 DIAGNOSIS — R059 Cough, unspecified: Secondary | ICD-10-CM | POA: Diagnosis not present

## 2021-10-07 DIAGNOSIS — Z20822 Contact with and (suspected) exposure to covid-19: Secondary | ICD-10-CM | POA: Diagnosis not present

## 2021-10-07 DIAGNOSIS — R051 Acute cough: Secondary | ICD-10-CM | POA: Diagnosis not present

## 2021-10-17 ENCOUNTER — Telehealth (INDEPENDENT_AMBULATORY_CARE_PROVIDER_SITE_OTHER): Payer: Medicare Other | Admitting: Internal Medicine

## 2021-10-17 DIAGNOSIS — R911 Solitary pulmonary nodule: Secondary | ICD-10-CM | POA: Diagnosis not present

## 2021-10-17 DIAGNOSIS — R591 Generalized enlarged lymph nodes: Secondary | ICD-10-CM | POA: Diagnosis not present

## 2021-10-17 DIAGNOSIS — R590 Localized enlarged lymph nodes: Secondary | ICD-10-CM | POA: Diagnosis not present

## 2021-10-17 NOTE — Progress Notes (Signed)
? ? ?  ? ?OV 05/06/2020 ? ?Subjective:  ?Patient ID: Elizabeth Ramirez, female , DOB: 11/14/1945 , age 76 y.o. , MRN: [id number] , ADDRESS: [address] ?[place] [address] ?PCP Cari Caraway, MD ?Patient Care Team: ?Cari Caraway, MD as PCP - General (Family Medicine) ?Magrinat, Virgie Dad, MD as Consulting Physician (Oncology) ?Excell Seltzer, MD (Inactive) as Consulting Physician (General Surgery) ?Bobbye Charleston, MD as Consulting Physician (Obstetrics and Gynecology) ?Justice Britain, MD as Consulting Physician (Orthopedic Surgery) ? ?This Provider for this visit: Treatment Team:  ?Attending Provider: Brand Males, MD ? ? ? ?05/06/2020 -   ?Chief Complaint  ?Patient presents with  ? Consult  ?  Pt is being referred by Dr. [name] for possible sarcoidosis. Pt denies any complaints of cough, SOB, or CP.  ? ? ? ?HPI ?Elizabeth Ramirez 76 y.o. - ? ?HPI 05/06/2020  ? ?-New consult evaluation.  Referred by Dr. [name] ? ?76 year old female who is dealing with left knee issue and is awaiting a left knee replacement May 20, 2020 with Dr. [name].  She is under surveillance for breast cancer and right when she is going to be discharged from long-term follow-up lymphadenopathy was described and discovered.  Started off with a lung nodule discovery in the left lower lobe 11 mm along with axillary node discovery on a CT chest.  This resulted in PET scan that is described below.  There is diffuse uptake particularly in the abdomen and the liver and the splenic areas.  She also had a inguinal node.  This was then biopsied in September 2021 and showed granuloma.  Sarcoidosis under consideration but she has granulomatous adenitis.  She basically has no respiratory symptoms.  She been referred here for evaluation of this.  There is no hemoptysis or shortness of breath or wheezing or cough.  There is no weight loss or fever or chills or any B symptoms.  Currently she is most concerned about getting and going  through her left knee replacement.  Rest of the history is documented as a copy and paste from Dr. [name] ? ?She is concerned about these findings.  Particularly she reports a strong family so pulmonary fibrosis in her dad and a paternal aunt and a paternal cousin who is the niece of the paternal aunt and her dad. ? ? ?- IMPRESSION: CT chest January 26, 2020 ?11 x 7 mm irregular nodule in the superior segment left lower lobe, ?new. This is indeterminate but raises concern for primary ?bronchogenic neoplasm or metastasis. This is just at the size ?threshold for PET sensitivity. Consider PET-CT for further ?evaluation. ?  ?Mildly progressive left axillary/subpectoral nodes measuring up to ?11 mm short axis. Contralateral nodal metastases would be unusual. ?Reactive nodes or low-grade lymphoproliferative disorder are also ?possible. This can be evaluated at the time of PET-CT. ?  ?10 mm short axis left celiac axis node, also minimally progressive, ?warranting attention at the time of PET-CT. ?  ?Aortic Atherosclerosis (ICD10-I70.0) and Emphysema (ICD10-J43.9). ?  ?  ?Electronically Signed ?  By: [name] M.D. ?  On: 01/26/2020 16:22 ?  ?PET scan February 08, 2020 ? ?IMPRESSION: ?1. Generalized nodal enlargement and or hypermetabolic lymph nodes ?with splenic enlargement and increased metabolic activity findings ?are most suggestive of lymphoproliferative disorder/lymphoma. RIGHT ?groin lymph node with considerable FDG uptake may be the best target ?for sampling. Would avoid LEFT axillary lymph nodes given that the ?patient reportedly has had recent COVID-19 vaccination which can be ?confounding  on PET scan. Correlate with side of vaccine ?administration. Metastatic disease is also considered though would ?be unusual in the setting of breast cancer with pelvic node ?predominant disease and splenic enlargement with mild ?hypermetabolism. ?2. Small lymph nodes in the neck and potential RIGHT parotid lesion, ?this  could be related but would suggest dedicated assessment of the ?parotid gland for further evaluation, this could be performed with ?CT or ultrasound as an initial means in further evaluating this ?area. ?3. Small lung nodule in the LEFT lower lobe is nonspecific in terms ?of FDG uptake suggesting indolent process. Morphologic features ?raising the question of bronchogenic neoplasm as outlined ?previously. ?4. Mild increased FDG uptake in the RIGHT breast may relate to ?recent biopsy at the site of previous surgery, refer to biopsy ?results and mammography for further information. ?5. Lobular hepatic contours could also be seen in the setting of ?liver disease, correlate with any clinical or laboratory evidence of ?liver disease. Liver disease could be an alternative explanation for ?splenic enlargement. ?6. Activation of brown fat along the spine and potentially in the ?area of the occipital region and posterior neck. ?  ?  ?Electronically Signed ?  By: [name] M.D. ?  On: 02/08/2020 16:59 ?  ?A. LYMPH NODE, RIGHT INGUINAL, BIOPSY: February 23, 2020 ?-  Lymph node with follicular hyperplasia, scattered giant cells and  ?epithelioid granulomas  ?-  No morphologic or immunophenotypic evidence of lymphoma  ?-  See comment  ? ?Famil hx ?Sanders father died at the age of 72 from pulmonary fibrosis.  A paternal aunt also had pulmonary fibrosis.  A paternal cousin also has pulmonary fibrosis.  Her mother died at age 77 with severe dementia. The patient had one brother no sisters. Her maternal grandmother died from stomach cancer the age of 74. The patient's mother had a half sister with ovarian cancer. ?  ? 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 She retired from [employer] as of 2018 and is now doing some Electronics engineer. At home it's her and [name] , who also is in real estate, with her own investment business..  ? ? ?GENETIC clinic 07/24/20 ? ?GENETIC TEST RESULTS: Genetic testing  reported out on 07/23/2020 through the [facility] Multi-Cancer Panel + Pulmonary Fibrosis panel found no pathogenic mutations.  ?  ?The Multi-Cancer Panel + Pulmonary Fibrosis panel offered by [facility] includes sequencing and/or deletion duplication testing of the following 97 genes: ABCA3, ACD, AIP, ALK, APC*, ATM*, AXIN2, BAP1, BARD1, BLM, BMPR1A, BRCA1, BRCA2, BRIP1, CASR, CDC73, CDH1, CDK4, CDKN1B, CDKN1C, CDKN2A (p14ARF), CDKN2A (p16INK4a), CEBPA, CHEK2, CTC1, CTNNA1, DICER1*, DIS3L2*, DKC1, EGFR, EPCAM*, FH*, FLCN, GATA2, GPC3*, GREM1*, HOXB13, HRAS, KIT, MAX*, MEN1*, MET*, MITF, MLH1*, MSH2*, MSH3*, MSH6*, MUTYH, NAF1, NBN, NF1*, NF2, NHP2, NOP10, NTHL1, PALB2, PARN, PDGFRA, PHOX2B*, PMS2*, POLD1*, POLE, POT1, PRKAR1A, PTCH1, PTEN*, RAD50, RAD51C, RAD51D, RB1*, RECQL4*, RET, RTEL1, RUNX1, SDHA*, SDHAF2, SDHB, SDHC*, SDHD, SFTPC, SMAD4, SMARCA4, SMARCB1, SMARCE1, STK11, SUFU, TERC, TERT, TINF2, TMEM127, TP53, TSC1*, TSC2, USB1, VHL, WRAP53, WRN*, WT1 ? ? ? ?OV 08/06/2020 ? ?Subjective:  ?Patient ID: Elizabeth Ramirez, female , DOB: 1946/03/04 , age 57 y.o. , MRN: [id number] , ADDRESS: [address] ?[place] [address] ?PCP Cari Caraway, MD ?Patient Care Team: ?Cari Caraway, MD as PCP - General (Family Medicine) ?Magrinat, Virgie Dad, MD as Consulting Physician (Oncology) ?Excell Seltzer, MD (Inactive) as Consulting Physician (General Surgery) ?Bobbye Charleston, MD as Consulting Physician (Obstetrics and Gynecology) ?Justice Britain, MD as  Consulting Physician (Orthopedic Surgery) ? ?This Provider for this visit: Treatment Team:  ?Attending Provider: Brand Males, MD ? ? ? ?08/06/2020 -   ?Chief Complaint  ?Patient presents with  ? Follow-up  ?  Doing well, no issues  ? ?Multiple pulmonary nodules in the setting of breast cancer and also family history of pulmonary fibrosis and also right inguinal lymphadenopathy September 2021 there is granuloma ? ?HPI ?Elizabeth Ramirez 75 y.o. -returns for follow-up.  In the interim we did testing for granuloma that includes QuantiFERON gold angiotensin-converting enzyme. These are normal. Immunoglobulins are normal. In the interim she has had a left knee surgery. He

## 2021-10-17 NOTE — Patient Instructions (Addendum)
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 ?-There is enlargement of the left axillary lymph node in Aug 2022 compared to August 2021. This could all be granuloma as evidenced by your right inguinal biopsy in September 2021 and left axillary node biopsy Sept 2022  ? ?= - clinical concerns is SARCOID but ASYMPTOMATIC ? ?- Noted strong adverse reaaction to steroids in self and family ? ?- Shared decision for expectant approach ? ?- Clinically doing well 10/17/2021 ? ? ?Plan ?- -At this point in time support and agree  we will hold off on any steroids or methotrexate for sarcoidosis consideration ? ? ?Pulmonary nodules - multiple ? ? -CT scan of the chest February 2022 shows resolution of the left lower lobe lymph node, persistence of the left upper lobe lymph node and multiple very tiny other nodules that are new compared to August 2021. [place] new 4-5 mm nodules in Right upper lobe and right lower lobe in Aug 202 ? ? ?Plan ?- repeat CT chest without contrast  in August /Sept 2023 ? ?Family history of pulmonary fibrosis ? ?CT scan of the chest February 2022 and Augst 2022 shows no evidence of pulmonary fibrosis ?-Genetic testing for pulmonary fibrosis is noncontributory in January 2022 ?-Glad you are feeling well without any shortness of breath ? ?Plan ?-Expectant follow-up ? ?Coronary artery calcification seen on CT scan February 2022 and Aug 2022 ? ?-Dr Acie Fredrickson following and aware in March 2022 visit ? ?Plan ?-per Dr Acie Fredrickson ? ? ? ?Follow-up ?- Face to face in Aug/Sept  2023 after CT chest; 15 min visit ?

## 2021-12-08 ENCOUNTER — Encounter: Payer: Self-pay | Admitting: Cardiovascular Disease

## 2021-12-08 ENCOUNTER — Ambulatory Visit: Payer: Medicare Other | Admitting: Cardiovascular Disease

## 2021-12-08 DIAGNOSIS — E782 Mixed hyperlipidemia: Secondary | ICD-10-CM

## 2021-12-11 NOTE — Telephone Encounter (Signed)
Routine lab orders placed at this time, pt made aware and lab appt scheduled. Pt no longer on Praluent. Will route to PharmD to make aware, but medication already removed from med list by another practice.

## 2021-12-12 DIAGNOSIS — Z23 Encounter for immunization: Secondary | ICD-10-CM | POA: Diagnosis not present

## 2021-12-18 ENCOUNTER — Other Ambulatory Visit: Payer: Self-pay | Admitting: Family Medicine

## 2021-12-18 DIAGNOSIS — Z1231 Encounter for screening mammogram for malignant neoplasm of breast: Secondary | ICD-10-CM

## 2021-12-26 ENCOUNTER — Encounter: Payer: Self-pay | Admitting: Cardiovascular Disease

## 2021-12-30 ENCOUNTER — Other Ambulatory Visit: Payer: Medicare Other

## 2021-12-30 DIAGNOSIS — E782 Mixed hyperlipidemia: Secondary | ICD-10-CM | POA: Diagnosis not present

## 2021-12-31 ENCOUNTER — Encounter: Payer: Self-pay | Admitting: Cardiovascular Disease

## 2021-12-31 LAB — LIPID PANEL
Chol/HDL Ratio: 3.6 ratio (ref 0.0–4.4)
Cholesterol, Total: 235 mg/dL — ABNORMAL HIGH (ref 100–199)
HDL: 65 mg/dL (ref >39)
LDL Chol Calc (NIH): 157 mg/dL — ABNORMAL HIGH (ref 0–99)
Triglycerides: 77 mg/dL (ref 0–149)
VLDL Cholesterol Cal: 13 mg/dL (ref 5–40)

## 2021-12-31 LAB — CBC
Hematocrit: 42 % (ref 34.0–46.6)
Hemoglobin: 14.1 g/dL (ref 11.1–15.9)
MCH: 30.5 pg (ref 26.6–33.0)
MCHC: 33.6 g/dL (ref 31.5–35.7)
MCV: 91 fL (ref 79–97)
Platelets: 85 10*3/uL — CL (ref 150–450)
RBC: 4.62 x10E6/uL (ref 3.77–5.28)
RDW: 12.9 % (ref 11.7–15.4)
WBC: 1.7 10*3/uL — CL (ref 3.4–10.8)

## 2021-12-31 LAB — COMPREHENSIVE METABOLIC PANEL
ALT: 16 IU/L (ref 0–32)
AST: 19 IU/L (ref 0–40)
Albumin/Globulin Ratio: 2.5 — ABNORMAL HIGH (ref 1.2–2.2)
Albumin: 4.5 g/dL (ref 3.8–4.8)
Alkaline Phosphatase: 73 IU/L (ref 44–121)
BUN/Creatinine Ratio: 13 (ref 12–28)
BUN: 16 mg/dL (ref 8–27)
Bilirubin Total: 1.2 mg/dL (ref 0.0–1.2)
CO2: 24 mmol/L (ref 20–29)
Calcium: 9.7 mg/dL (ref 8.7–10.3)
Chloride: 102 mmol/L (ref 96–106)
Creatinine, Ser: 1.23 mg/dL — ABNORMAL HIGH (ref 0.57–1.00)
Globulin, Total: 1.8 g/dL (ref 1.5–4.5)
Glucose: 90 mg/dL (ref 70–99)
Potassium: 4.4 mmol/L (ref 3.5–5.2)
Sodium: 141 mmol/L (ref 134–144)
Total Protein: 6.3 g/dL (ref 6.0–8.5)
eGFR: 46 mL/min/{1.73_m2} — ABNORMAL LOW (ref >59)

## 2021-12-31 NOTE — Progress Notes (Signed)
Cardiology Office Note:    Date:  01/02/2022   ID:  Elizabeth Ramirez, DOB February 21, 1946, MRN [id number]  PCP:  Cari Caraway, MD  Cardiologist:  Mertie Moores, MD    Referring MD: Cari Caraway, MD   Chief Complaint  Patient presents with   Hyperlipidemia   Heart Murmur    Previous notes.   Elizabeth Ramirez is a 76 y.o. female who is being seen today for the evaluation of heart murmur  at the request of Cari Caraway, MD.   Elizabeth Ramirez is a 76 y.o. female with a hx of hypothyroidism, breast cancer, obesity, right total knee replacement who we are asked to see today for evaluation of heart murmur.  Heart murmur was incidentally found on physical exam.  She denies having any episodes of chest pain or shortness of breath. She goes to water aerobics on a regular basis.  No cough or cold symptoms.   No seasonal allergies   Has had breast cancer twice ( right breast)   September 02, 2020:   Elizabeth Ramirez is seen today for follow up of her heart murmur , she is [employer] partner .  Had a knee replacement   Sees Dr. [name].   A recent CT scan showed some coronary artery calcifications Has enlarged lymph nodes.   Thought to have sarcoidosis Has been exercising .  No cp with exercise , no DOE , 30 minutes, 3 times a week   Echo from 2018: normal LV function , Mild MR, mild TR   Has lost 50 lbs from Jan. 2021 Is not interested in starting a statin  Has agreed to do a coronary calcium score   December 31, 2021:   Elizabeth Ramirez is seen today for follow-up of her hyperlipidemia and coronary artery calcifications. Coronary calcium score of 291. This was 83 percentile for age-, race-, and sex-matched controls She has tried multiple statins for her hyperlipidemia but she does not tolerate them.  She is also tried Pralulent  but does not tolerate that either.  Recent lab work from 2 days ago reveals an LDL of 157.  Total cholesterol is 235.  HDL is 65.  Triglyceride level is 77.  I would like  for her to try Repatha or Inclisiran    WBC is 1.7 ,  has seen gus Magranat in the past .   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  Additionally, no variants of uncertain significance (VUSs) were found.  The Breast/Ovarian gene panel offered by [facility] [name], MD) includes sequencing and deletion/duplication analysis for the following 19 genes:  ATM, BARD1, BRCA1,  BRCA2, BRIP1, CDH1, CHEK2, FANCC, MLH1, MSH2, MSH6, NBN, PALB2, PMS2, PTEN, RAD51C, RAD51D, TP53, and XRCC2.  This panel also includes deletion/duplication analysis (without sequencing) for one gene, EPCAM.  Date of report is January 14, 2015.     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   Sarcoid 05/02/2021   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   Right proximal bridge of nose ([id number])   Seasonal allergies    Thyroid disease    Vaccine counseling 05/02/2021   Vitamin D deficiency     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   BREAST LUMPECTOMY Right 2016   BREAST LUMPECTOMY WITH NEEDLE LOCALIZATION Right 02/11/2015   Procedure: WIRE BRACKETED RIGHT BREAST LUMPECTOMY ;  Surgeon: Excell Seltzer, MD;  Location: [facility];  Service: General;  Laterality: Right;   CATARACT EXTRACTION, BILATERAL  10-05-2017 left ; 11-09-2017 right   Dr Kandy Garrison    colonoscopy with biopsy  12/28/2003   neg   EVACUATION BREAST HEMATOMA Left 03/11/2021   Procedure: LEFT AXILLARY SEROMA DRAINAGE AND DRAIN PLACEMENT;  Surgeon: Rolm Bookbinder, MD;  Location: [facility];  Service: General;  Laterality: Left;   LYMPH NODE DISSECTION Right 02/23/2020   Procedure: right inguinal lymph node excision;  Surgeon: Alphonsa Overall, MD;  Location: [facility];  Service: General;  Laterality: Right;   RADIOACTIVE SEED GUIDED AXILLARY SENTINEL LYMPH NODE Left 02/18/2021   Procedure: RADIOACTIVE SEED GUIDED LEFT AXILLARY LYMPH NODE EXCISION;  Surgeon: Rolm Bookbinder, MD;  Location: [facility];  Service: General;  Laterality: Left;   REVERSE SHOULDER ARTHROPLASTY Left 07/14/2018   Procedure: REVERSE SHOULDER ARTHROPLASTY;  Surgeon: Justice Britain, MD;  Location: [facility];  Service: Orthopedics;  Laterality: Left;  Interscalene Block   TONSILLECTOMY     1954   TOTAL KNEE ARTHROPLASTY Right 05/27/2015   Procedure: RIGHT TOTAL KNEE ARTHROPLASTY;  Surgeon: Gaynelle Arabian, MD;  Location: [facility];  Service: Orthopedics;  Laterality: Right;   TOTAL KNEE ARTHROPLASTY Left 05/20/2020   Procedure: TOTAL KNEE  ARTHROPLASTY;  Surgeon: Gaynelle Arabian, MD;  Location: [facility];  Service: Orthopedics;  Laterality: Left;  64mn   WISDOM TOOTH EXTRACTION Bilateral 1965    Current Medications: Current Meds  Medication Sig   Calcium Carbonate-Vitamin D 600-400 MG-UNIT tablet 2 tablets   SYNTHROID 100 MCG tablet Take 100 mcg by mouth every morning.   Current Facility-Administered Medications for the 01/02/22 encounter (Office Visit) with Zlatan Hornback, PWonda Cheng MD  Medication   0.9 %  sodium chloride infusion     Allergies:   Levofloxacin, Tetracyclines & related, Other, Anesthesia s-i-40 [propofol], Ciprofloxacin, Flonase [fluticasone], Ibuprofen, Nitrofurantoin monohyd macro, Pravastatin, Rosuvastatin, and Scopolamine   Social History   Socioeconomic History   Marital status: DSoil scientist   Spouse name:  Not on file   Number of children: 0   Years of education: Not on file   Highest education level: Not on file  Occupational History   Not on file  Tobacco Use   Smoking status: Former    Packs/day: 1.00    Years: 9.00    Total pack years: 9.00    Types: Cigarettes    Quit date: 06/16/1983    Years since quitting: 38.5   Smokeless tobacco: Never  Vaping Use   Vaping Use: Never used  Substance and Sexual Activity   Alcohol use: Yes    Alcohol/week: 2.0 standard drinks of alcohol    Types: 2 Glasses of wine per week    Comment: Twice a week   Drug use: No   Sexual activity: Not on file  Other Topics Concern   Not on file  Social History Narrative   Not on file   Social Determinants of Health   Financial Resource Strain: Not on file  Food Insecurity: Not on file  Transportation Needs: Not on file  Physical Activity: Not on file  Stress: Not on file  Social Connections: Not on file     Family History: The patient's family history includes Breast cancer in her cousin; Colitis in her paternal grandmother; Colon cancer in her cousin; Dementia in her mother; Diabetes in her brother,  paternal aunt, and paternal grandfather; Emphysema in her paternal uncle; Heart Problems in her paternal aunt, paternal uncle, and paternal uncle; Heart attack in her maternal grandfather; Kidney failure in her paternal aunt; Leukemia (age of onset: 6) in her brother; Lung cancer in her paternal uncle; Multiple myeloma in her maternal aunt; Multiple myeloma (age of onset: 9) in her maternal aunt; Other in her paternal aunt; Other (age of onset: 67) in her mother; Ovarian cancer in her maternal aunt; Parkinson's disease in her maternal aunt; Pulmonary fibrosis in her cousin, father, and paternal aunt; Stomach cancer in her maternal grandmother; Stroke in her paternal aunt and paternal grandfather; Thyroid disease in her brother. ROS:   Please see the history of present illness.     All other systems reviewed and are negative.  EKGs/Labs/Other Studies Reviewed:      EKG:   January 02, 2022: Normal sinus rhythm at 74.  No ST or T wave changes.   Recent Labs: 12/30/2021: ALT 16; BUN 16; Creatinine, Ser 1.23; Hemoglobin 14.1; Platelets 85; Potassium 4.4; Sodium 141  Recent Lipid Panel    Component Value Date/Time   CHOL 235 (H) 12/30/2021 1137   TRIG 77 12/30/2021 1137   HDL 65 12/30/2021 1137   CHOLHDL 3.6 12/30/2021 1137   LDLCALC 157 (H) 12/30/2021 1137    Physical Exam:    Physical Exam: Blood pressure 130/84, pulse 74, height '5\' 4"'  (1.626 m), weight 200 lb 9.6 oz (91 kg), SpO2 99 %.  GEN:  elderly female,  moderately obese in no acute distress HEENT: Normal NECK: No JVD; No carotid bruits LYMPHATICS: No lymphadenopathy CARDIAC: RRR , soft systolic murmur  RESPIRATORY:  Clear to auscultation without rales, wheezing or rhonchi  ABDOMEN: Soft, non-tender, non-distended MUSCULOSKELETAL:  No edema; No deformity  SKIN: Warm and dry NEUROLOGIC:  Alert and oriented x 3   ECG :    January 02, 2022: Normal sinus rhythm at 74.  Normal EKG  ASSESSMENT:    1. Mixed hyperlipidemia   2.  Coronary artery calcification     PLAN:       Soft systolic murmur.  Her systolic murmur is unchanged.  2.  Coronary artery calcifications:    She has coronary artery calcifications.  We will try her on inclisiran or perhaps Repatha to see if she tolerates this better than she did Praluent.   3.  Hyperlipidemia: Labs from several days ago revealed her LDL is still very high.  She is not tolerated Praluent in the past.  We will send her back to the lipid clinic to consider inclisiran or Repatha.   Medication Adjustments/Labs and Tests Ordered: Current medicines are reviewed at length with the patient today.  Concerns regarding medicines are outlined above.  Orders Placed This Encounter  Procedures   AMB Referral to [facility]   EKG 12-Lead   No orders of the defined types were placed in this encounter.     Signed, Mertie Moores, MD  01/02/2022 4:49 PM    [place]

## 2022-01-02 ENCOUNTER — Encounter: Payer: Self-pay | Admitting: Cardiovascular Disease

## 2022-01-02 ENCOUNTER — Ambulatory Visit (INDEPENDENT_AMBULATORY_CARE_PROVIDER_SITE_OTHER): Payer: Medicare Other | Admitting: Cardiovascular Disease

## 2022-01-02 VITALS — BP 130/84 | HR 74 | Ht 64.0 in | Wt 200.6 lb

## 2022-01-02 DIAGNOSIS — I2584 Coronary atherosclerosis due to calcified coronary lesion: Secondary | ICD-10-CM

## 2022-01-02 DIAGNOSIS — I2510 Atherosclerotic heart disease of native coronary artery without angina pectoris: Secondary | ICD-10-CM | POA: Diagnosis not present

## 2022-01-02 DIAGNOSIS — E782 Mixed hyperlipidemia: Secondary | ICD-10-CM | POA: Diagnosis not present

## 2022-01-02 NOTE — Patient Instructions (Addendum)
Medication Instructions: Your physician recommends that you continue on your current medications as directed. Please refer to the Current Medication list given to you today.  *If you need a refill on your cardiac medications before your next appointment, please call your pharmacy*    Follow-Up: At [facility], you and your health needs are our priority.  As part of our continuing mission to provide you with exceptional heart care, we have created designated Provider Care Teams.  These Care Teams include your primary Cardiologist (physician) and Advanced Practice Providers (APPs -  Physician Assistants and Nurse Practitioners) who all work together to provide you with the care you need, when you need it.  We recommend signing up for the patient portal called "MyChart".  Sign up information is provided on this After Visit Summary.  MyChart is used to connect with patients for Virtual Visits (Telemedicine).  Patients are able to view lab/test results, encounter notes, upcoming appointments, etc.  Non-urgent messages can be sent to your provider as well.   To learn more about what you can do with MyChart, go to [email or web address].    Your next appointment:   1 year(s)  The format for your next appointment:   In Person  Provider:   Dr. Acie Fredrickson   Other Instructions Follow up with [facility]

## 2022-01-20 IMAGING — CT NM PET TUM IMG INITIAL (PI) SKULL BASE T - THIGH
1 of 7 series · 1 of 25 positions shown · non-contrast
Comparison: Chest CT January 26, 2020 and chest CT March 07, 2019

CLINICAL DATA: Initial treatment strategy for pulmonary nodules in
a patient with history of breast cancer.

EXAM:
NUCLEAR MEDICINE PET SKULL BASE TO THIGH
TECHNIQUE: 13.22 mCi F-18 FDG was injected intravenously. Full-ring PET imaging
was performed from the skull base to thigh after the radiotracer. CT
data was obtained and used for attenuation correction and anatomic
localization.
Fasting blood glucose: 93 mg/dl

[Series 3: pet sk_thigh ac · axial · 5.0mm · 4.07mm/px · 1 of 223 slices shown]
[im 134/223]
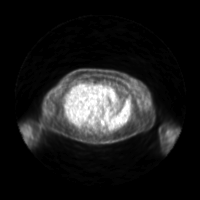

[1 of 25 positions shown; findings below may reference images not displayed]

FINDINGS: Mediastinal blood pool activity: SUV max

Liver activity: SUV max NA

NECK: Markedly hypermetabolic RIGHT level 1 lymph nodes (image 28,
series 4) (SUVmax = 9.7) largest approximately 7 mm with an adjacent
5 mm rounded lymph node just anterior to the angle of the mandible
on the RIGHT.

RIGHT level 2 lymph node obscured by streak artifact and small
intraparotid lesion also likely with increased metabolic activity
but not well seen on CT images. (Image 22, series 4) less
hypermetabolic than other lymph nodes described approximately
maximum SUV with a size of approximately 7 mm on image 23 of series
2

Incidental CT findings:

Misregistration artifact with areas of nonspecific hypermetabolic
changes in the posterior neck, LEFT neck in particular. No signs of
asymmetric or pharyngeal uptake.

CHEST: LEFT axillary lymph nodes with intense FDG uptake (image 56,
series 4) (SUVmax = 7.6) (image 56, series 4) 11 mm lymph node with
other small lymph nodes showing slightly less FDG uptake tracking
into the LEFT subpectoral region

Diffuse, symmetric paraspinal activity without discrete lesion.

Mildly increased FDG uptake in the RIGHT axilla without discrete
corresponding CT abnormality (image 51, series 4) (SUVmax = 4.0)

No mediastinal adenopathy with frankly hypermetabolic features.

Area of concern in the superior segment of the LEFT lower lobe is
unchanged accounting for the respiratory variation that is seen on
today's examination. No new pulmonary nodules or consolidation.

(Image 28, series 8) (SUVmax = 0.95)

Incidental CT findings: Scattered atherosclerosis. Coronary artery
calcifications. No pericardial effusion. Heart size is stable.
Postoperative changes in the RIGHT breast with multiple surgical
clips, CT appearance unchanged compared to previous imaging. Area
associated with bandlike FDG uptake only slightly greater than
mediastinal blood pool. Area was recently biopsied.

ABDOMEN/PELVIS: Focal area of increased metabolic activity along the
LEFT hepatic margin (image 87, series 4) (SUVmax = 5.7 no CT
correlate for this area. Adjacent stomach does appear separate from
this area of heterogeneity.

Diffuse hepatic heterogeneity is noted. A second area which appears
to be above the background hepatic heterogeneity is noted in the
RIGHT hepatic lobe without CT correlate. These areas are only mildly
of above the liver background which is approximately maximum SUV of
4. Background splenic activity is greater than that of the liver
with a maximum SUV approximately

Retroperitoneal adenopathy (image 106, series 4) 8 mm lymph node
with a maximum SUV of

(Image 117, series 4) 6 mm lymph node with maximum SUV of 7.8. Other
scattered small nodes with similar or slightly less FDG uptake in
the retroperitoneum.

Bulky pelvic lymph nodes with marked hypermetabolic activity (image
159, series [DATE] cm LEFT pelvic sidewall lymph node (SUVmax = 14

Index node in the RIGHT groin with a maximum SUV of 12.7 on image
187 of series 4 18 mm short axis.

Numerous other pelvic lymph nodes greater than 10 total nodes in the
pelvis and inguinal regions with hypermetabolic features.

Incidental CT findings: Liver with lobular hepatic contours and
fissural widening. Pancreas is normal. Spleen enlarged as above.

Adrenal glands are normal. No hydronephrosis. Urinary bladder under
distended limiting assessment.

No acute gastrointestinal process, colonic diverticulosis. Aortic
atherosclerosis without aneurysmal dilation. Unremarkable appearance
of the uterus and adnexa not well evaluated.

SKELETON: No focal hypermetabolic activity to suggest skeletal
metastasis.

Incidental CT findings: Post LEFT shoulder arthroplasty.
Glenohumeral degenerative changes and spinal degenerative changes.
IMPRESSION: 1. Generalized nodal enlargement and or hypermetabolic lymph nodes
with splenic enlargement and increased metabolic activity findings
are most suggestive of lymphoproliferative disorder/lymphoma. RIGHT
groin lymph node with considerable FDG uptake may be the best target
for sampling. Would avoid LEFT axillary lymph nodes given that the
patient reportedly has had recent [id number] vaccination which can be
confounding on PET scan. Correlate with side of vaccine
administration. Metastatic disease is also considered though would
be unusual in the setting of breast cancer with pelvic node
predominant disease and splenic enlargement with mild
hypermetabolism.
2. Small lymph nodes in the neck and potential RIGHT parotid lesion,
this could be related but would suggest dedicated assessment of the
parotid gland for further evaluation, this could be performed with
CT or ultrasound as an initial means in further evaluating this
area.
3. Small lung nodule in the LEFT lower lobe is nonspecific in terms
of FDG uptake suggesting indolent process. Morphologic features
raising the question of bronchogenic neoplasm as outlined
previously.
4. Mild increased FDG uptake in the RIGHT breast may relate to
recent biopsy at the site of previous surgery, refer to biopsy
results and mammography for further information.
5. Lobular hepatic contours could also be seen in the setting of
liver disease, correlate with any clinical or laboratory evidence of
liver disease. Liver disease could be an alternative explanation for
splenic enlargement.
6. Activation of brown fat along the spine and potentially in the
area of the occipital region and posterior neck.

## 2022-01-26 ENCOUNTER — Ambulatory Visit
Admission: RE | Admit: 2022-01-26 | Discharge: 2022-01-26 | Disposition: A | Discharge status: Home / Self Care | Payer: Medicare Other | Source: Ambulatory Visit | Attending: Family Medicine | Admitting: Family Medicine

## 2022-01-26 DIAGNOSIS — Z1231 Encounter for screening mammogram for malignant neoplasm of breast: Secondary | ICD-10-CM | POA: Diagnosis not present

## 2022-01-26 NOTE — Progress Notes (Unsigned)
Patient ID: Elizabeth Ramirez                 DOB: [birth year]                    MRN: [id number]     HPI: Elizabeth Ramirez is a 76 y.o. female patient referred to lipid clinic by Dr Acie Fredrickson. PMH is significant for hypothyroidism, breast cancer, obesity, and heart murmur. Pt underwent coronary calcium scoring 10/01/20 which came back at 41 (71st percentile for age and sex matched control), notable for aortic atherosclerosis and calcium throughout the LAD. She was started on rosuvastatin 43m daily. 2 months later, pt sent in message reporting muscle pain and weakness on rosuvastatin therapy. She was referred to [facility] for lipid management. Repeat calcium score on 01/23/21 was 291 (76th percentile for age, race and sex-matched controls). At [facility] clinic patient shared that she did not start rosuvastatin until after lipids were checked in June and LDL was elevated at 153. She experienced joint and muscle pain 2 weeks after starting statin. She continued rosuvastatin for a month before stopping and symptoms resolved upon discontinuation. At this visit she also shared she had lost 70 lbs in the past few years, 20 from [employer] and 50 from [employer]. At this visit pt was willing to try pravastatin 20 mg, which she preferred to take every other day for the first 2 weeks. Patient stopped taking pravastatin after more than 2 weeks due to muscle and joint pain, skin rash and constipation. She was later started on Praluent after September 6th, which she later stopped due to severe joint problems. Lipid panel on 12/30/21 showed LDL of 157 off of LLT.    Confirm when pt stopped praluent (looks like it was December 2022?) -  Discuss repatha vs inclisiran (but if insurance preferred praluent would it not approve repatha?)    Current Medications: none Intolerances: rosuvastatin 139mdaily - muscle pain and weakness, pravastatin 20 mg (muscle and joint pain, skin rash and constipation), praluent (severe joint  problems) Risk Factors: aortic atherosclerosis with elevated calcium score LDL goal: <7052mL  Diet: Lost 20 lbs with [employer], then an additional 50 lbs with [employer]  Exercise: was doing water aerobics pre-COVID, does online exercises now   Family History: Breast cancer in her cousin; Colitis in her paternal grandmother; Colon cancer in her cousin; Dementia in her mother; Diabetes in her brother, paternal aunt, and paternal grandfather; Emphysema in her paternal uncle; Heart Problems in her paternal aunt, paternal uncle, and paternal uncle; Heart attack in her maternal grandfather; Kidney failure in her paternal aunt; Leukemia (age of onset: 70)50n her brother; Lung cancer in her paternal uncle; Multiple myeloma in her maternal aunt; Multiple myeloma (age of onset: 75)24n her maternal aunt; Other in her paternal aunt; Other (age of onset: 81)7n her mother; Ovarian cancer in her maternal aunt; Parkinson's disease in her maternal aunt; Pulmonary fibrosis in her cousin, father, and paternal aunt; Stomach cancer in her maternal grandmother; Stroke in her paternal aunt and paternal grandfather; Thyroid disease in her brother.  Social History: Former smoker 1 PPD for 9 years, quit in 1985, denies drug use, 2 glasses of wine per week.  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  Additionally, no variants of uncertain significance (VUSs) were found.  The Breast/Ovarian gene panel offered by [facility] [name], MD) includes sequencing and deletion/duplication analysis for the following 19 genes:  ATM, BARD1, BRCA1, BRCA2, BRIP1, CDH1, CHEK2, FANCC, MLH1, MSH2, MSH6, NBN, PALB2, PMS2, PTEN, RAD51C, RAD51D, TP53, and XRCC2.  This panel also includes deletion/duplication analysis (without sequencing) for one gene, EPCAM.  Date of report is January 14, 2015.     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     Current Facility-Administered Medications on File Prior to Visit  Medication Dose Route Frequency Provider Last Rate Last Admin   0.9 %  sodium chloride infusion   Intravenous PRN Eileen Stanford, PA-C        Allergies  Allergen Reactions   Levofloxacin Other (See Comments)    Affected  tendons, severe pain and swelling   Tetracyclines & Related Swelling    tongue swelling and peeling   Other Diarrhea, Rash and Other (See Comments)    Crabs - diarrhea. Bay leaves - rash in her mouth   Anesthesia S-I-40 [Propofol]     Other reaction(s): respiratory failure requiring rescue meds   Ciprofloxacin     Tendon problems    Flonase [Fluticasone]     Nasal spray cause nose bleeds   Ibuprofen     Gets mouth ulcers if she takes for more than 3 days; Aleve okay    Nitrofurantoin Monohyd Macro Hives   Pravastatin     Muscle aches, rash, constipation on 76m every other day dosing   Rosuvastatin     Muscle and joint pain on 15mdaily dosing   Scopolamine     "Headache and redness of eyes"    Assessment/Plan:  1. Hyperlipidemia - LDL-C of 157 is above  goal of < 70 due to elevated calcium score.

## 2022-01-27 ENCOUNTER — Ambulatory Visit (INDEPENDENT_AMBULATORY_CARE_PROVIDER_SITE_OTHER): Payer: Medicare Other | Admitting: Pharmacist

## 2022-01-27 DIAGNOSIS — I2584 Coronary atherosclerosis due to calcified coronary lesion: Secondary | ICD-10-CM | POA: Diagnosis not present

## 2022-01-27 DIAGNOSIS — I2510 Atherosclerotic heart disease of native coronary artery without angina pectoris: Secondary | ICD-10-CM

## 2022-01-27 DIAGNOSIS — E782 Mixed hyperlipidemia: Secondary | ICD-10-CM

## 2022-01-27 NOTE — Patient Instructions (Signed)
Your current LDL-C is 157. Your LDL-C goal is <70.  Today we discussed the options of Repatha which is a twice monthly injection which will lower LDL-C by 60%, and Nexlizet, which is a once daily pill and will lower LDL-C by 40%.   Continue changes in diet and exercise, can add a 10 minute walk after dinner to achieve exercise goal of 150 minutes/week.   Call us back at [phone] if you would like to try either of these.

## 2022-02-04 ENCOUNTER — Ambulatory Visit
Admission: RE | Admit: 2022-02-04 | Discharge: 2022-02-04 | Disposition: A | Discharge status: Home / Self Care | Payer: Medicare Other | Source: Ambulatory Visit | Attending: Internal Medicine | Admitting: Internal Medicine

## 2022-02-04 ENCOUNTER — Other Ambulatory Visit: Payer: Medicare Other

## 2022-02-04 DIAGNOSIS — R918 Other nonspecific abnormal finding of lung field: Secondary | ICD-10-CM | POA: Diagnosis not present

## 2022-02-04 DIAGNOSIS — R911 Solitary pulmonary nodule: Secondary | ICD-10-CM | POA: Diagnosis not present

## 2022-02-04 DIAGNOSIS — R590 Localized enlarged lymph nodes: Secondary | ICD-10-CM

## 2022-02-04 DIAGNOSIS — I700 Atherosclerosis of aorta: Secondary | ICD-10-CM | POA: Diagnosis not present

## 2022-02-04 DIAGNOSIS — R591 Generalized enlarged lymph nodes: Secondary | ICD-10-CM

## 2022-02-04 DIAGNOSIS — J9811 Atelectasis: Secondary | ICD-10-CM | POA: Diagnosis not present

## 2022-02-06 ENCOUNTER — Encounter: Payer: Self-pay | Admitting: Internal Medicine

## 2022-02-06 DIAGNOSIS — R911 Solitary pulmonary nodule: Secondary | ICD-10-CM

## 2022-02-06 NOTE — Telephone Encounter (Signed)
+   There are 2 new pulmonary nodules both a small 5 mm 1 in the left upper lobe and one in the right lower lobe.  No follow-up is needed to discuss the specific result but radiology is recommending on account of her breast cancer history to have a repeat CT scan of the chest in 3-6 months.  I recommend 4  months CT chest without contrast and a follow-up that time with me  CT Chest Wo Contrast  Result Date: 02/05/2022 CLINICAL DATA:  Multiple lung nodules, follow-up evaluation. EXAM: CT CHEST WITHOUT CONTRAST TECHNIQUE: Multidetector CT imaging of the chest was performed following the standard protocol without IV contrast. RADIATION DOSE REDUCTION: This exam was performed according to the departmental dose-optimization program which includes automated exposure control, adjustment of the mA and/or kV according to patient size and/or use of iterative reconstruction technique. COMPARISON:  January 31, 2021 FINDINGS: Cardiovascular: Noncontrast appearance of the heart great vessels is stable with signs of aortic atherosclerosis and coronary artery disease. Mediastinum/Nodes: No adenopathy in the chest. There are scattered small lymph nodes throughout the LEFT axilla none greater than a cm following LEFT axillary dissection with bandlike soft tissue in the LEFT axilla measuring 2.5 x 0.8 cm. Streak artifact from LEFT shoulder arthroplasty limiting assessment in this location. Postoperative changes about the RIGHT breast. Esophagus grossly normal. Lungs/Pleura: Scattered small pulmonary nodules in the LEFT upper lobe with surrounding ground-glass. New pulmonary nodule (image 55/8) 5 mm in the LEFT upper lobe. Otherwise stable appearance of LEFT upper lobe since previous imaging. New 5 mm RIGHT upper lobe pulmonary nodule new from prior imaging. Mild basilar atelectasis. Airways are patent. No consolidation or effusion. Upper Abdomen: Unremarkable to the extent evaluated Musculoskeletal: Postoperative changes about the  RIGHT breast. No acute or destructive bone findings. Spinal degenerative changes. LEFT shoulder arthroplasty and marked RIGHT glenohumeral degenerative changes similar to prior imaging. IMPRESSION: 1. Scattered small pulmonary nodules in the LEFT upper lobe with surrounding ground-glass. New pulmonary nodule in the LEFT upper lobe 5 mm in the LEFT upper lobe. New 5 mm RIGHT upper lobe pulmonary nodule. Findings could be related to an infectious or inflammatory process but given patient history of breast cancer as [employer] guidelines do not apply consider short interval, 3 to six-month follow-up to ensure stability. 2. Postoperative changes about the RIGHT breast. 3. Streak artifact from LEFT shoulder arthroplasty limiting assessment in this location. 4. Aortic atherosclerosis and coronary artery disease. Aortic Atherosclerosis (ICD10-I70.0). Electronically Signed   By: [name] M.D.   On: 02/05/2022 13:56

## 2022-02-06 NOTE — Telephone Encounter (Signed)
Dr. Ramaswamy, Please see patient message and advise.  Thank you. 

## 2022-02-12 DIAGNOSIS — H35363 Drusen (degenerative) of macula, bilateral: Secondary | ICD-10-CM | POA: Diagnosis not present

## 2022-02-12 DIAGNOSIS — H35033 Hypertensive retinopathy, bilateral: Secondary | ICD-10-CM | POA: Diagnosis not present

## 2022-02-12 DIAGNOSIS — H33103 Unspecified retinoschisis, bilateral: Secondary | ICD-10-CM | POA: Diagnosis not present

## 2022-02-13 ENCOUNTER — Other Ambulatory Visit: Payer: Self-pay

## 2022-02-13 DIAGNOSIS — Z170 Estrogen receptor positive status [ER+]: Secondary | ICD-10-CM

## 2022-02-13 DIAGNOSIS — Z01419 Encounter for gynecological examination (general) (routine) without abnormal findings: Secondary | ICD-10-CM | POA: Diagnosis not present

## 2022-02-17 ENCOUNTER — Inpatient Hospital Stay (HOSPITAL_BASED_OUTPATIENT_CLINIC_OR_DEPARTMENT_OTHER): Payer: Medicare Other | Admitting: Hematology and Oncology

## 2022-02-17 ENCOUNTER — Encounter: Payer: Self-pay | Admitting: Hematology and Oncology

## 2022-02-17 ENCOUNTER — Inpatient Hospital Stay: Payer: Medicare Other | Attending: Hematology and Oncology

## 2022-02-17 ENCOUNTER — Other Ambulatory Visit: Payer: Self-pay

## 2022-02-17 VITALS — BP 116/78 | HR 86 | Temp 98.1°F | Resp 16 | Ht 64.0 in | Wt 200.0 lb

## 2022-02-17 DIAGNOSIS — D869 Sarcoidosis, unspecified: Secondary | ICD-10-CM | POA: Insufficient documentation

## 2022-02-17 DIAGNOSIS — D479 Neoplasm of uncertain behavior of lymphoid, hematopoietic and related tissue, unspecified: Secondary | ICD-10-CM | POA: Diagnosis not present

## 2022-02-17 DIAGNOSIS — Z86000 Personal history of in-situ neoplasm of breast: Secondary | ICD-10-CM | POA: Diagnosis not present

## 2022-02-17 DIAGNOSIS — Z170 Estrogen receptor positive status [ER+]: Secondary | ICD-10-CM

## 2022-02-17 DIAGNOSIS — Z923 Personal history of irradiation: Secondary | ICD-10-CM | POA: Diagnosis not present

## 2022-02-17 DIAGNOSIS — C50411 Malignant neoplasm of upper-outer quadrant of right female breast: Secondary | ICD-10-CM

## 2022-02-17 LAB — CBC WITH DIFFERENTIAL (CANCER CENTER ONLY)
Abs Immature Granulocytes: 0 10*3/uL (ref 0.00–0.07)
Basophils Absolute: 0 10*3/uL (ref 0.0–0.1)
Basophils Relative: 1 %
Eosinophils Absolute: 0.1 10*3/uL (ref 0.0–0.5)
Eosinophils Relative: 2 %
HCT: 39.1 % (ref 36.0–46.0)
Hemoglobin: 13.8 g/dL (ref 12.0–15.0)
Immature Granulocytes: 0 %
Lymphocytes Relative: 45 %
Lymphs Abs: 1 10*3/uL (ref 0.7–4.0)
MCH: 30.8 pg (ref 26.0–34.0)
MCHC: 35.3 g/dL (ref 30.0–36.0)
MCV: 87.3 fL (ref 80.0–100.0)
Monocytes Absolute: 0.3 10*3/uL (ref 0.1–1.0)
Monocytes Relative: 14 %
Neutro Abs: 0.9 10*3/uL — ABNORMAL LOW (ref 1.7–7.7)
Neutrophils Relative %: 38 %
Platelet Count: 90 10*3/uL — ABNORMAL LOW (ref 150–400)
RBC: 4.48 MIL/uL (ref 3.87–5.11)
RDW: 13.1 % (ref 11.5–15.5)
WBC Count: 2.2 10*3/uL — ABNORMAL LOW (ref 4.0–10.5)
nRBC: 0 % (ref 0.0–0.2)

## 2022-02-17 LAB — CMP (CANCER CENTER ONLY)
ALT: 13 U/L (ref 0–44)
AST: 15 U/L (ref 15–41)
Albumin: 4.3 g/dL (ref 3.5–5.0)
Alkaline Phosphatase: 61 U/L (ref 38–126)
Anion gap: 6 (ref 5–15)
BUN: 22 mg/dL (ref 8–23)
CO2: 27 mmol/L (ref 22–32)
Calcium: 9.8 mg/dL (ref 8.9–10.3)
Chloride: 108 mmol/L (ref 98–111)
Creatinine: 1.22 mg/dL — ABNORMAL HIGH (ref 0.44–1.00)
GFR, Estimated: 46 mL/min — ABNORMAL LOW (ref >60)
Glucose, Bld: 98 mg/dL (ref 70–99)
Potassium: 4.3 mmol/L (ref 3.5–5.1)
Sodium: 141 mmol/L (ref 135–145)
Total Bilirubin: 0.9 mg/dL (ref 0.3–1.2)
Total Protein: 6.4 g/dL — ABNORMAL LOW (ref 6.5–8.1)

## 2022-02-17 NOTE — Progress Notes (Signed)
[place]  Telephone:([phone] [phone]    ID: Elizabeth Ramirez DOB: 1946-04-22  MR#: [id number]  JIR#:[id number]  Patient Care Team: Cari Caraway, MD as PCP - General (Family Medicine) Nahser, Wonda Cheng, MD as PCP - Cardiology (Cardiology) Magrinat, Virgie Dad, MD (Inactive) as Consulting Physician (Oncology) Excell Seltzer, MD (Inactive) as Consulting Physician (General Surgery) Bobbye Charleston, MD as Consulting Physician (Obstetrics and Gynecology) Justice Britain, MD as Consulting Physician (Orthopedic Surgery) Rolm Bookbinder, MD as Consulting Physician (General Surgery) OTHER MD:    CHIEF COMPLAINT:  Ductal carcinoma in situ; sarcoidosis   CURRENT TREATMENT: Continued observation   INTERVAL HISTORY:  She is here for a follow up.  Since last visit, she continues to work on her weight and has been successful.  She lost about 70 pounds overall.  She feels well, can move easily.  She is now trying to increase her exercise to lose a bit more weight.  She denies any changes in her breast.  She recently had some CT scans as well as mammogram.  CT scan showed small lung nodules hence she will require another scan.  Mammogram was normal.  She only takes levothyroxine and vitamin D. Rest of the pertinent 10 point ROS reviewed and negative    COVID 19 VACCINATION STATUS: Pfizer x5, as of December 2022; infection 10/2020   BREAST CANCER HISTORY: From the original intake note:  [name] underwent Right lumpectomy and sentinel lymph node biopsy April 2005 for 4 mm, grade 1 tubular tumor (T1a No, stage IA), in the setting of DCIS. This was estrogen and progesterone receptor positive, HER-2 not amplified. Margins were ample. She participated in the MA-27 study and received anastrozole between June 2005 and June 2010, at which time she was released from follow-up.  More recently, [name] had screening bilateral mammography showing a suspicious area in the right  breast and on 12/14/2014 underwent right diagnostic mammography at the breast Center. The breast density was category A. There was a group of pleomorphic calcifications in the upper central right breast spanning up to 2.8 cm. Biopsy of this area was obtained 12/21/2014 ([id number]) and showed ductal carcinoma in situ, grade 2 or 3, estrogen receptor 95% positive, and progesterone receptor 60% positive, both with strong staining intensity.  The patient's case was presented at the multidisciplinary breast cancer conference 01/02/2015. At that time a preliminary plan was proposed, namely genetics counseling and if no mutation noted breast MRI to be followed by lumpectomy, radiation, and anti-estrogens.  Her subsequent history is as detailed below.   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  Additionally, no variants of uncertain significance (VUSs) were found.  The Breast/Ovarian gene panel offered by [facility] [name], MD) includes sequencing and deletion/duplication analysis for the following  19 genes:  ATM, BARD1, BRCA1, BRCA2, BRIP1, CDH1, CHEK2, FANCC, MLH1, MSH2, MSH6, NBN, PALB2, PMS2, PTEN, RAD51C, RAD51D, TP53, and XRCC2.  This panel also includes deletion/duplication analysis (without sequencing) for one gene, EPCAM.  Date of report is January 14, 2015.      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 05/02/2021   OA (osteoarthritis) of knee 05/27/2015   Obesity, morbid, BMI 40.0-49.9 ([place]) 12/31/2015   Other chronic pain    Personal history of radiation therapy    PONV (postoperative nausea and vomiting)    extreme nausea and vomiting - cannot tolerate scopalamine    S/P total knee arthroplasty 05/27/2015   Sarcoid 05/02/2021   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   Right proximal bridge of nose ([id number])   Seasonal allergies    Thyroid disease    Vaccine counseling 05/02/2021   Vitamin D deficiency     PAST SURGICAL HISTORY: 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   BREAST LUMPECTOMY Right 2016   BREAST LUMPECTOMY WITH NEEDLE LOCALIZATION Right 02/11/2015   Procedure: WIRE BRACKETED RIGHT BREAST LUMPECTOMY ;  Surgeon: Excell Seltzer, MD;  Location: [facility];  Service: General;  Laterality: Right;   CATARACT EXTRACTION, BILATERAL  10-05-2017 left ; 11-09-2017 right   Dr Kandy Garrison    colonoscopy with biopsy  12/28/2003   neg   EVACUATION BREAST HEMATOMA Left 03/11/2021   Procedure: LEFT AXILLARY SEROMA DRAINAGE AND DRAIN PLACEMENT;  Surgeon: Rolm Bookbinder, MD;  Location: [facility];  Service: General;  Laterality: Left;   LYMPH NODE DISSECTION Right 02/23/2020   Procedure: right inguinal lymph node excision;  Surgeon: Alphonsa Overall, MD;  Location: [facility];  Service: General;  Laterality: Right;   RADIOACTIVE SEED GUIDED AXILLARY SENTINEL LYMPH NODE Left 02/18/2021   Procedure: RADIOACTIVE SEED GUIDED LEFT AXILLARY LYMPH NODE EXCISION;  Surgeon:  Rolm Bookbinder, MD;  Location: [facility];  Service: General;  Laterality: Left;   REVERSE SHOULDER ARTHROPLASTY Left 07/14/2018   Procedure: REVERSE SHOULDER ARTHROPLASTY;  Surgeon: Justice Britain, MD;  Location: [facility];  Service: Orthopedics;  Laterality: Left;  Interscalene Block   TONSILLECTOMY     1954   TOTAL KNEE ARTHROPLASTY Right 05/27/2015   Procedure: RIGHT TOTAL KNEE ARTHROPLASTY;  Surgeon: Gaynelle Arabian, MD;  Location: [facility];  Service: Orthopedics;  Laterality: Right;   TOTAL KNEE ARTHROPLASTY Left 05/20/2020   Procedure: TOTAL KNEE ARTHROPLASTY;  Surgeon: Gaynelle Arabian, MD;  Location: [facility];  Service: Orthopedics;  Laterality: Left;  25mn   WISDOM TOOTH EXTRACTION Bilateral 1965    FAMILY HISTORY Family History  Problem Relation Age of Onset   Dementia Mother    Other Mother 876      "spot on lung"; secondhand smoke exposure   Pulmonary fibrosis Father    Diabetes Brother    Thyroid disease Brother    Leukemia Brother 738      chronic lymphocytic    Stomach cancer Maternal Grandmother        dx. 359s  Heart attack Maternal Grandfather    Diabetes Paternal Grandfather  Stroke Paternal Grandfather    Colitis Paternal Grandmother    Parkinson's disease Maternal Aunt    Diabetes Paternal Aunt    Other Paternal Aunt        bowel obstruction   Heart Problems Paternal Uncle    Ovarian cancer Maternal Aunt        dx. 19s; paternal half sister of her mother   Multiple myeloma Maternal Aunt    Multiple myeloma Maternal Aunt 75   Kidney failure Paternal Aunt    Pulmonary fibrosis Paternal Aunt    Stroke Paternal Aunt    Heart Problems Paternal Aunt    Emphysema Paternal Uncle    Heart Problems Paternal Uncle    Lung cancer Paternal Uncle        smoker   Colon cancer Cousin    Breast cancer Cousin        dx. 105s   Pulmonary fibrosis Remi Deter father died at the age of 76 from pulmonary fibrosis. Her mother died at age 64 with severe  dementia. The patient had one brother no sisters.  Her brother has Parkinson's disease and chronic lymphoid leukemia.  Her maternal grandmother died from stomach cancer the age of 19. The patient's mother had a half sister with ovarian cancer.   GYNECOLOGIC HISTORY:  No LMP recorded. Patient is postmenopausal.  menarche age 46, she isGX P0. Menopause 2003. Did not take hormone replacement   SOCIAL HISTORY:  Works as a Cabin crew, Publishing copy in renovations. She retired from [employer] as of 2018 and is now doing some Electronics engineer. At home it's her and Henriette Combs , who also is in real estate, with her own investment business.Katharine Look is a [employer], though not currently practicing    ADVANCED DIRECTIVES:  [name] is Chief Technology Officer of attorney   HEALTH MAINTENANCE: Social History   Tobacco Use   Smoking status: Former    Packs/day: 1.00    Years: 9.00    Total pack years: 9.00    Types: Cigarettes    Quit date: 06/16/1983    Years since quitting: 38.7   Smokeless tobacco: Never  Vaping Use   Vaping Use: Never used  Substance Use Topics   Alcohol use: Yes    Alcohol/week: 2.0 standard drinks of alcohol    Types: 2 Glasses of wine per week    Comment: Twice a week   Drug use: No     Colonoscopy:  20/15, [place] she knee  PAP: up-to-date/[facility]  Bone density: October 2016; normal  Lipid panel:  Allergies  Allergen Reactions   Levofloxacin Other (See Comments)    Affected tendons, severe pain and swelling   Tetracyclines & Related Swelling    tongue swelling and peeling   Other Diarrhea, Rash and Other (See Comments)    Crabs - diarrhea. Bay leaves - rash in her mouth   Anesthesia S-I-40 [Propofol]     Other reaction(s): respiratory failure requiring rescue meds   Ciprofloxacin     Tendon problems    Flonase [Fluticasone]     Nasal spray cause nose bleeds   Ibuprofen     Gets mouth ulcers if she takes for more than 3 days; Aleve okay    Nitrofurantoin Monohyd  Macro Hives   Praluent [Alirocumab]     Joint pain   Pravastatin     Muscle aches, rash, constipation on 8m every other day dosing   Rosuvastatin     Muscle and joint pain on 174m  daily dosing   Scopolamine     "Headache and redness of eyes"    Current Outpatient Medications  Medication Sig Dispense Refill   Calcium Carbonate-Vitamin D 600-400 MG-UNIT tablet 2 tablets     SYNTHROID 100 MCG tablet Take 100 mcg by mouth every morning.     Current Facility-Administered Medications  Medication Dose Route Frequency Provider Last Rate Last Admin   0.9 %  sodium chloride infusion   Intravenous PRN Eileen Stanford, PA-C        OBJECTIVE: White woman in no acute distress  There were no vitals filed for this visit.      There is no height or weight on file to calculate BMI.    ECOG FS:1 - Symptomatic but completely ambulatory  Physical Exam Constitutional:      Appearance: Normal appearance.  Cardiovascular:     Rate and Rhythm: Normal rate and regular rhythm.  Pulmonary:     Effort: Pulmonary effort is normal.     Breath sounds: Normal breath sounds.  Chest:     Comments: Bilateral breasts inspected.  Right breast status postlumpectomy twice with surgical scar.  No palpable masses or regional adenopathy.  Left breast normal to inspection and palpation Abdominal:     General: Abdomen is flat.     Palpations: Abdomen is soft.  Musculoskeletal:     Cervical back: Normal range of motion and neck supple. No rigidity.  Lymphadenopathy:     Cervical: No cervical adenopathy.  Skin:    General: Skin is warm and dry.  Neurological:     General: No focal deficit present.     Mental Status: She is alert.       RESULTS: CT Chest Wo Contrast  Result Date: 02/05/2022 CLINICAL DATA:  Multiple lung nodules, follow-up evaluation. EXAM: CT CHEST WITHOUT CONTRAST TECHNIQUE: Multidetector CT imaging of the chest was performed following the standard protocol without IV contrast.  RADIATION DOSE REDUCTION: This exam was performed according to the departmental dose-optimization program which includes automated exposure control, adjustment of the mA and/or kV according to patient size and/or use of iterative reconstruction technique. COMPARISON:  January 31, 2021 FINDINGS: Cardiovascular: Noncontrast appearance of the heart great vessels is stable with signs of aortic atherosclerosis and coronary artery disease. Mediastinum/Nodes: No adenopathy in the chest. There are scattered small lymph nodes throughout the LEFT axilla none greater than a cm following LEFT axillary dissection with bandlike soft tissue in the LEFT axilla measuring 2.5 x 0.8 cm. Streak artifact from LEFT shoulder arthroplasty limiting assessment in this location. Postoperative changes about the RIGHT breast. Esophagus grossly normal. Lungs/Pleura: Scattered small pulmonary nodules in the LEFT upper lobe with surrounding ground-glass. New pulmonary nodule (image 55/8) 5 mm in the LEFT upper lobe. Otherwise stable appearance of LEFT upper lobe since previous imaging. New 5 mm RIGHT upper lobe pulmonary nodule new from prior imaging. Mild basilar atelectasis. Airways are patent. No consolidation or effusion. Upper Abdomen: Unremarkable to the extent evaluated Musculoskeletal: Postoperative changes about the RIGHT breast. No acute or destructive bone findings. Spinal degenerative changes. LEFT shoulder arthroplasty and marked RIGHT glenohumeral degenerative changes similar to prior imaging. IMPRESSION: 1. Scattered small pulmonary nodules in the LEFT upper lobe with surrounding ground-glass. New pulmonary nodule in the LEFT upper lobe 5 mm in the LEFT upper lobe. New 5 mm RIGHT upper lobe pulmonary nodule. Findings could be related to an infectious or inflammatory process but given patient history of breast cancer as [employer]  do not apply consider short interval, 3 to six-month follow-up to ensure stability. 2.  Postoperative changes about the RIGHT breast. 3. Streak artifact from LEFT shoulder arthroplasty limiting assessment in this location. 4. Aortic atherosclerosis and coronary artery disease. Aortic Atherosclerosis (ICD10-I70.0). Electronically Signed   By: [name] M.D.   On: 02/05/2022 13:56   MM 3D SCREEN BREAST BILATERAL  Result Date: 01/27/2022 CLINICAL DATA:  Screening. RIGHT lumpectomy in 2005 and 2016 EXAM: DIGITAL SCREENING BILATERAL MAMMOGRAM WITH TOMOSYNTHESIS AND CAD TECHNIQUE: Bilateral screening digital craniocaudal and mediolateral oblique mammograms were obtained. Bilateral screening digital breast tomosynthesis was performed. The images were evaluated with computer-aided detection. Best images possible per technologist communication. COMPARISON:  Previous exam(s). ACR Breast Density Category b: There are scattered areas of fibroglandular density. FINDINGS: There are no findings suspicious for malignancy. There is density and architectural distortion within the RIGHT breast, consistent with postsurgical changes. These are stable in comparison to prior. IMPRESSION: No mammographic evidence of malignancy. A result letter of this screening mammogram will be mailed directly to the patient. RECOMMENDATION: Screening mammogram in one year. (Code:SM-B-01Y) BI-RADS CATEGORY  2: Benign. Electronically Signed   By: Valentino Saxon M.D.   On: 01/27/2022 12:37     CMP     Component Value Date/Time   NA 141 12/30/2021 1137   K 4.4 12/30/2021 1137   CL 102 12/30/2021 1137   CO2 24 12/30/2021 1137   GLUCOSE 90 12/30/2021 1137   GLUCOSE 106 (H) 05/21/2021 0855   BUN 16 12/30/2021 1137   CREATININE 1.23 (H) 12/30/2021 1137   CREATININE 1.28 (H) 01/26/2020 1220   CALCIUM 9.7 12/30/2021 1137   PROT 6.3 12/30/2021 1137   ALBUMIN 4.5 12/30/2021 1137   AST 19 12/30/2021 1137   AST 14 (L) 01/26/2020 1220   ALT 16 12/30/2021 1137   ALT 15 01/26/2020 1220   ALKPHOS 73 12/30/2021 1137    BILITOT 1.2 12/30/2021 1137   BILITOT 1.0 01/26/2020 1220   GFRNONAA 46 (L) 05/21/2021 0855   GFRNONAA 41 (L) 01/26/2020 1220   GFRAA 51 (L) 03/01/2020 1447   GFRAA 47 (L) 01/26/2020 1220    INo results found for: "SPEP", "UPEP"  Lab Results  Component Value Date   WBC 1.7 (LL) 12/30/2021   NEUTROABS 1.4 (L) 02/04/2021   HGB 14.1 12/30/2021   HCT 42.0 12/30/2021   MCV 91 12/30/2021   PLT 85 (LL) 12/30/2021      Chemistry      Component Value Date/Time   NA 141 12/30/2021 1137   K 4.4 12/30/2021 1137   CL 102 12/30/2021 1137   CO2 24 12/30/2021 1137   BUN 16 12/30/2021 1137   CREATININE 1.23 (H) 12/30/2021 1137   CREATININE 1.28 (H) 01/26/2020 1220      Component Value Date/Time   CALCIUM 9.7 12/30/2021 1137   ALKPHOS 73 12/30/2021 1137   AST 19 12/30/2021 1137   AST 14 (L) 01/26/2020 1220   ALT 16 12/30/2021 1137   ALT 15 01/26/2020 1220   BILITOT 1.2 12/30/2021 1137   BILITOT 1.0 01/26/2020 1220       Lab Results  Component Value Date   LABCA2 22 12/04/2008    No components found for: "LABCA125"  No results for input(s): "INR" in the last 168 hours.  Urinalysis    Component Value Date/Time   COLORURINE YELLOW 05/20/2015 1120   APPEARANCEUR CLEAR 05/20/2015 1120   LABSPEC 1.009 05/20/2015 1120   PHURINE 6.5 05/20/2015 1120  GLUCOSEU NEGATIVE 05/20/2015 1120   HGBUR NEGATIVE 05/20/2015 1120   BILIRUBINUR NEGATIVE 05/20/2015 1120   BILIRUBINUR neg 12/19/2014 1427   KETONESUR NEGATIVE 05/20/2015 1120   PROTEINUR NEGATIVE 05/20/2015 1120   UROBILINOGEN 0.2 12/19/2014 1427   NITRITE NEGATIVE 05/20/2015 1120   LEUKOCYTESUR NEGATIVE 05/20/2015 1120    STUDIES: CT Chest Wo Contrast  Result Date: 02/05/2022 CLINICAL DATA:  Multiple lung nodules, follow-up evaluation. EXAM: CT CHEST WITHOUT CONTRAST TECHNIQUE: Multidetector CT imaging of the chest was performed following the standard protocol without IV contrast. RADIATION DOSE REDUCTION: This exam was  performed according to the departmental dose-optimization program which includes automated exposure control, adjustment of the mA and/or kV according to patient size and/or use of iterative reconstruction technique. COMPARISON:  January 31, 2021 FINDINGS: Cardiovascular: Noncontrast appearance of the heart great vessels is stable with signs of aortic atherosclerosis and coronary artery disease. Mediastinum/Nodes: No adenopathy in the chest. There are scattered small lymph nodes throughout the LEFT axilla none greater than a cm following LEFT axillary dissection with bandlike soft tissue in the LEFT axilla measuring 2.5 x 0.8 cm. Streak artifact from LEFT shoulder arthroplasty limiting assessment in this location. Postoperative changes about the RIGHT breast. Esophagus grossly normal. Lungs/Pleura: Scattered small pulmonary nodules in the LEFT upper lobe with surrounding ground-glass. New pulmonary nodule (image 55/8) 5 mm in the LEFT upper lobe. Otherwise stable appearance of LEFT upper lobe since previous imaging. New 5 mm RIGHT upper lobe pulmonary nodule new from prior imaging. Mild basilar atelectasis. Airways are patent. No consolidation or effusion. Upper Abdomen: Unremarkable to the extent evaluated Musculoskeletal: Postoperative changes about the RIGHT breast. No acute or destructive bone findings. Spinal degenerative changes. LEFT shoulder arthroplasty and marked RIGHT glenohumeral degenerative changes similar to prior imaging. IMPRESSION: 1. Scattered small pulmonary nodules in the LEFT upper lobe with surrounding ground-glass. New pulmonary nodule in the LEFT upper lobe 5 mm in the LEFT upper lobe. New 5 mm RIGHT upper lobe pulmonary nodule. Findings could be related to an infectious or inflammatory process but given patient history of breast cancer as [employer] guidelines do not apply consider short interval, 3 to six-month follow-up to ensure stability. 2. Postoperative changes about the RIGHT  breast. 3. Streak artifact from LEFT shoulder arthroplasty limiting assessment in this location. 4. Aortic atherosclerosis and coronary artery disease. Aortic Atherosclerosis (ICD10-I70.0). Electronically Signed   By: [name] M.D.   On: 02/05/2022 13:56   MM 3D SCREEN BREAST BILATERAL  Result Date: 01/27/2022 CLINICAL DATA:  Screening. RIGHT lumpectomy in 2005 and 2016 EXAM: DIGITAL SCREENING BILATERAL MAMMOGRAM WITH TOMOSYNTHESIS AND CAD TECHNIQUE: Bilateral screening digital craniocaudal and mediolateral oblique mammograms were obtained. Bilateral screening digital breast tomosynthesis was performed. The images were evaluated with computer-aided detection. Best images possible per technologist communication. COMPARISON:  Previous exam(s). ACR Breast Density Category b: There are scattered areas of fibroglandular density. FINDINGS: There are no findings suspicious for malignancy. There is density and architectural distortion within the RIGHT breast, consistent with postsurgical changes. These are stable in comparison to prior. IMPRESSION: No mammographic evidence of malignancy. A result letter of this screening mammogram will be mailed directly to the patient. RECOMMENDATION: Screening mammogram in one year. (Code:SM-B-01Y) BI-RADS CATEGORY  2: Benign. Electronically Signed   By: Valentino Saxon M.D.   On: 01/27/2022 12:37     ASSESSMENT: 76 y.o. [place] woman  (1) status post right lumpectomy April 2005 for a pT1a pN0, stage IA invasive breast cancer, estrogen and progesterone  receptor positive, HER-2 negative, in the setting of DCIS;   (a) did not receive radiation  (b) status post anastrozole for 5 years completed June 2010  (2) status post right breast upper outer quadrant biopsy 12/21/2014 for ductal carcinoma in situ, grade 2 or 3, estrogen and progesterone receptor positive.  (3) Status post right lumpectomy 02/11/2015 showing ductal carcinoma in situ, intermediate grade,  measuring 0.9 cm, with close but negative margins  (4) adjuvant radiation:   03/07/2015-04/04/2015    Right breast/ 42.5 Gy at 2.5 Gy per fraction x 17 fractions.   Right breast boost/ 7.5 Gy at 2.5 Gy per fraction x 3 fractions  (5) genetic testing 01/14/2015 through the [place] Panel offered by [facility] [name], MD) showed no deleterious mutations in ATM, BARD1, BRCA1, BRCA2, BRIP1, CDH1, CHEK2, FANCC, MLH1, MSH2, MSH6, NBN, PALB2, PMS2, PTEN, RAD51C, RAD51D, TP53, and XRCC2.  This panel also includes deletion/duplication analysis (without sequencing) for one gene, EPCAM.   (6) resumed anastrozole 07/06/2014, discontinued March 2017 with arthralgias and myalgias developing   (a) started letrozole 10/14/2015--discontinued after a brief course secondary to side effects  (b) Bone density 03/29/2015 was normal, with the lowest T score at 0.4  (7) additional genetics testing 07/23/2020 on the [facility] Multi-Cancer Panel + Pulmonary Fibrosis Panel found no deleterious mutations in ABCA3, ACD, AIP, ALK, APC*, ATM*, AXIN2, BAP1, BARD1, BLM, BMPR1A, BRCA1, BRCA2, BRIP1, CASR, CDC73, CDH1, CDK4, CDKN1B, CDKN1C, CDKN2A (p14ARF), CDKN2A (p16INK4a), CEBPA, CHEK2, CTC1, CTNNA1, DICER1*, DIS3L2*, DKC1, EGFR, EPCAM*, FH*, FLCN, GATA2, GPC3*, GREM1*, HOXB13, HRAS, KIT, MAX*, MEN1*, MET*, MITF, MLH1*, MSH2*, MSH3*, MSH6*, MUTYH, NAF1, NBN, NF1*, NF2, NHP2, NOP10, NTHL1, PALB2, PARN, PDGFRA, PHOX2B*, PMS2*, POLD1*, POLE, POT1, PRKAR1A, PTCH1, PTEN*, RAD50, RAD51C, RAD51D, RB1*, RECQL4*, RET, RTEL1, RUNX1, SDHA*, SDHAF2, SDHB, SDHC*, SDHD, SFTPC, SMAD4, SMARCA4, SMARCB1, SMARCE1, STK11, SUFU, TERC, TERT, TINF2, TMEM127, TP53, TSC1*, TSC2, USB1, VHL, WRAP53, WRN*, WT1.  (a) VUS in ALK identified called [id number].   (8) work-up for lymphadenopathy, likely sarcoidosis  (A) chest CT obtained to follow nonspecific pulmonary nodules 01/26/2020 shows left axillary and left celiac  adenopathy.   (i)  HIV screen 03/01/2020 nonreactive, CRP was 1.1, angiotensin-converting enzyme was 56 (normal) with repeat 04/04/2021 mildly elevated at 71  (B) PET scan 02/08/2020 shows generalized lymphadenopathy, which is hypermetabolic, together with splenic enlargement  (C) right inguinal lymph node biopsy 57/26/2035 showed follicular hyperplasia and epithelioid granulomas, no morphologic or immunophenotypic evidence of lymphoma.  This was read as most consistent with a reactive process not excluding sarcoidosis  (D) chest CT scan 08/02/2020 again shows left axillary subpectoral celiac and retroperitoneal adenopathy as well as multiple very small scattered pulmonary nodules  (E) left axillary lymph node biopsy 11/27/2020 shows an atypical lymphoid proliferation with no B-cell monoclonal component.  (F) left axillary lymph node excision 02/18/2021 showed florid reactive lymphoid hyperplasia with no evidence of a lymphoproliferative process and no metastatic malignancy.  Sarcoidosis was not excluded nor was infection (leishmaniasis, toxoplasmosis etc.).  (G) infectious work-up under Dr. Tommy Medal October 2022 including QuantiFERON, histoplasma blastomyces and cryptococcal antigens and serologies for toxoplasmosis, RPR and CMV and EBV titers with PCR testing for bacteria fungi and mycobacteria showed no evidence of an infectious etiology.    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  Since last visit she had a CT chest which showed small lung nodules and she has a follow-up CT scan.  She recently had a  mammogram which was unremarkable.  Physical examination today was unremarkable.  She had extensive work-up for her lymphadenopathy which was thought to be sarcoidosis after infectious disease work-up.  She will continue annual mammograms and return to clinic with [place] once a year per her request. She knows to call for any other issue that may develop  before then. Applauded her for her weight loss, she is now starting some exercise regimen   Total time spent: 30 minutes  *Total Encounter Time as defined by the Centers for Medicare and Medicaid Services includes, in addition to the face-to-face time of a patient visit (documented in the note above) non-face-to-face time: obtaining and reviewing outside history, ordering and reviewing medications, tests or procedures, care coordination (communications with other health care professionals or caregivers) and documentation in the medical record.

## 2022-03-17 DIAGNOSIS — Z23 Encounter for immunization: Secondary | ICD-10-CM | POA: Diagnosis not present

## 2022-04-09 DIAGNOSIS — Z23 Encounter for immunization: Secondary | ICD-10-CM | POA: Diagnosis not present

## 2022-04-20 DIAGNOSIS — E039 Hypothyroidism, unspecified: Secondary | ICD-10-CM | POA: Diagnosis not present

## 2022-04-20 DIAGNOSIS — N1832 Chronic kidney disease, stage 3b: Secondary | ICD-10-CM | POA: Diagnosis not present

## 2022-05-05 DIAGNOSIS — Z6836 Body mass index (BMI) 36.0-36.9, adult: Secondary | ICD-10-CM | POA: Diagnosis not present

## 2022-05-05 DIAGNOSIS — E039 Hypothyroidism, unspecified: Secondary | ICD-10-CM | POA: Diagnosis not present

## 2022-05-05 DIAGNOSIS — E2839 Other primary ovarian failure: Secondary | ICD-10-CM | POA: Diagnosis not present

## 2022-05-05 DIAGNOSIS — Z1331 Encounter for screening for depression: Secondary | ICD-10-CM | POA: Diagnosis not present

## 2022-05-05 DIAGNOSIS — R918 Other nonspecific abnormal finding of lung field: Secondary | ICD-10-CM | POA: Diagnosis not present

## 2022-05-05 DIAGNOSIS — N1831 Chronic kidney disease, stage 3a: Secondary | ICD-10-CM | POA: Diagnosis not present

## 2022-05-05 DIAGNOSIS — B009 Herpesviral infection, unspecified: Secondary | ICD-10-CM | POA: Diagnosis not present

## 2022-05-05 DIAGNOSIS — E782 Mixed hyperlipidemia: Secondary | ICD-10-CM | POA: Diagnosis not present

## 2022-05-05 DIAGNOSIS — I700 Atherosclerosis of aorta: Secondary | ICD-10-CM | POA: Diagnosis not present

## 2022-05-05 DIAGNOSIS — Z0000 Encounter for general adult medical examination without abnormal findings: Secondary | ICD-10-CM | POA: Diagnosis not present

## 2022-05-05 DIAGNOSIS — I2510 Atherosclerotic heart disease of native coronary artery without angina pectoris: Secondary | ICD-10-CM | POA: Diagnosis not present

## 2022-05-06 ENCOUNTER — Other Ambulatory Visit: Payer: Self-pay | Admitting: Family Medicine

## 2022-05-06 DIAGNOSIS — E2839 Other primary ovarian failure: Secondary | ICD-10-CM

## 2022-05-25 ENCOUNTER — Telehealth: Payer: Self-pay | Admitting: Internal Medicine

## 2022-05-25 ENCOUNTER — Ambulatory Visit
Admission: RE | Admit: 2022-05-25 | Discharge: 2022-05-25 | Disposition: A | Discharge status: Home / Self Care | Payer: Medicare Other | Source: Ambulatory Visit | Attending: Internal Medicine | Admitting: Internal Medicine

## 2022-05-25 DIAGNOSIS — I700 Atherosclerosis of aorta: Secondary | ICD-10-CM | POA: Diagnosis not present

## 2022-05-25 DIAGNOSIS — R911 Solitary pulmonary nodule: Secondary | ICD-10-CM | POA: Diagnosis not present

## 2022-05-25 DIAGNOSIS — R918 Other nonspecific abnormal finding of lung field: Secondary | ICD-10-CM | POA: Diagnosis not present

## 2022-05-25 NOTE — Telephone Encounter (Signed)
[facility] called to confirm we have recd the call report for this PT. Please verify. I told her we had. Tnx.

## 2022-05-26 ENCOUNTER — Telehealth: Payer: Self-pay | Admitting: Internal Medicine

## 2022-05-26 ENCOUNTER — Telehealth: Payer: Self-pay | Admitting: Hematology and Oncology

## 2022-05-26 ENCOUNTER — Telehealth: Payer: Self-pay | Admitting: *Deleted

## 2022-05-26 NOTE — Telephone Encounter (Signed)
Contacted patient to scheduled appointments. Left message with appointment details and a call back number if patient had any questions or could not accommodate the time we provided.   

## 2022-05-26 NOTE — Telephone Encounter (Signed)
I am seeing her 05/29/22 but I think is related to her breast cancer. So , if you need to me to send her for biopsy or PET scan I can and save some time

## 2022-05-26 NOTE — Telephone Encounter (Signed)
Called report from [facility]:  IMPRESSION: 1. Significant interval enlargement of a left axillary lymph node, consistent with recurrent nodal metastatic disease. Additional surgical clips and postoperative scarring in the left axilla. 2. New pulmonary nodules of the dependent right lower lobe measuring 0.5 cm and 0.6 cm. A previously seen new nodule of the posterior right upper lobe is resolved. Small pulmonary nodules of the posterior left upper lobe are not significantly changed, measuring up to 0.4 cm. Although as previously reported, these small nodules are nonspecific and possibly infectious or inflammatory, given history of breast cancer, and especially given clear evidence of axillary nodal metastatic disease, these are highly suspicious for small metastases. Given small size and dependent location, new nodules in the right lung base are not likely amenable to FDG PET characterization for metabolic activity. 3. Coronary artery disease.

## 2022-05-26 NOTE — Telephone Encounter (Signed)
  Dear Elizabeth Ramirez  You saw Elizabeth Ramirez  in [facility].   I am following for nodules. WE got call report that her left axillary node is larger. I think with Dr [name] one of the nodes ? Groin was c/w sarcoid but wanted to alert you about the left axilla enlargemet  THanks    SIGNATURE    Dr. Brand Males, M.D., F.C.C.P,  Pulmonary and Critical Care Medicine Staff Physician, [place] Director - Interstitial Lung Disease  Program  Medical Director - [facility] [place] [facility] [place], [place], [address]   Pager: [phone], If no answer  -[place] or [place] [phone] Telephone (clinical office): [phone] Telephone (research): [phone]  12:08 PM 05/26/2022    CT Chest Wo Contrast  Result Date: 05/25/2022 CLINICAL DATA:  Follow-up lung nodules, sarcoidosis, history of breast cancer * Tracking Code: BO * EXAM: CT CHEST WITHOUT CONTRAST TECHNIQUE: Multidetector CT imaging of the chest was performed following the standard protocol without IV contrast. RADIATION DOSE REDUCTION: This exam was performed according to the departmental dose-optimization program which includes automated exposure control, adjustment of the mA and/or kV according to patient size and/or use of iterative reconstruction technique. COMPARISON:  02/04/2022 FINDINGS: Cardiovascular: Aortic atherosclerosis. Normal heart size. Left coronary artery calcifications. No pericardial effusion. Mediastinum/Nodes: Significant interval enlargement of a left axillary lymph node measuring 3.2 x 1.5 cm, previously 1.8 x 0.7 cm (series 2, image 42). Additional surgical clips and postoperative scarring in the left axilla. Thyroid gland, trachea, and esophagus demonstrate no significant findings. Lungs/Pleura: New pulmonary nodule of the dependent right lower lobe measuring 0.5 cm (series 5, image 99). Additional new nodule of the deep right  costophrenic recess measuring 0.6 cm (series 5, image 130). A previously seen new nodule of the posterior right upper lobe is resolved (series 5, image 34). Small pulmonary nodules of the posterior left upper lobe are not significantly changed, measuring up to 0.4 cm (series 5, image 52). Unchanged mild scarring of the bilateral lung bases. No pleural effusion or pneumothorax. Upper Abdomen: No acute abnormality. Musculoskeletal: Postoperative findings of right lumpectomy. No acute osseous findings. Status post left shoulder arthroplasty. IMPRESSION: 1. Significant interval enlargement of a left axillary lymph node, consistent with recurrent nodal metastatic disease. Additional surgical clips and postoperative scarring in the left axilla. 2. New pulmonary nodules of the dependent right lower lobe measuring 0.5 cm and 0.6 cm. A previously seen new nodule of the posterior right upper lobe is resolved. Small pulmonary nodules of the posterior left upper lobe are not significantly changed, measuring up to 0.4 cm. Although as previously reported, these small nodules are nonspecific and possibly infectious or inflammatory, given history of breast cancer, and especially given clear evidence of axillary nodal metastatic disease, these are highly suspicious for small metastases. Given small size and dependent location, new nodules in the right lung base are not likely amenable to FDG PET characterization for metabolic activity. 3. Coronary artery disease. These results will be called to the ordering clinician or representative by the Radiologist Assistant, and communication documented in the PACS or [employer]. Aortic Atherosclerosis (ICD10-I70.0). Electronically Signed   By: Delanna Ahmadi M.D.   On: 05/25/2022 15:46

## 2022-05-26 NOTE — Telephone Encounter (Signed)
[name]. She received MyChart message that she had an appt with Dr. Chryl Heck at 1345 tomorrow. [name], [name]

## 2022-05-27 ENCOUNTER — Inpatient Hospital Stay: Payer: Medicare Other | Attending: Hematology and Oncology | Admitting: Hematology and Oncology

## 2022-05-27 NOTE — Progress Notes (Deleted)
[place]  [phone] [phone]    ID: Elizabeth Ramirez DOB: 03-23-1946  MR#: [id number]  XKG#:[id number]  Patient Care Team: Cari Caraway, MD as PCP - General (Family Medicine) Nahser, Wonda Cheng, MD as PCP - Cardiology (Cardiology) Magrinat, Virgie Dad, MD (Inactive) as Consulting Physician (Oncology) Excell Seltzer, MD (Inactive) as Consulting Physician (General Surgery) Bobbye Charleston, MD as Consulting Physician (Obstetrics and Gynecology) Justice Britain, MD as Consulting Physician (Orthopedic Surgery) Rolm Bookbinder, MD as Consulting Physician (General Surgery) OTHER MD:   CHIEF COMPLAINT:  Ductal carcinoma in situ; sarcoidosis   CURRENT TREATMENT: Continued observation  INTERVAL HISTORY:  She is here for a follow up.  Since last visit, she continues to work on her weight and has been successful.  She lost about 70 pounds overall.  She feels well, can move easily.  She is now trying to increase her exercise to lose a bit more weight.  She denies any changes in her breast.  She recently had some CT scans as well as mammogram.  CT scan showed small lung nodules hence she will require another scan.  Mammogram was normal.  She only takes levothyroxine and vitamin D. Rest of the pertinent 10 point ROS reviewed and negative    COVID 19 VACCINATION STATUS: Pfizer x5, as of December 2022; infection 10/2020   BREAST CANCER HISTORY: From the original intake note:  Mayrene underwent Right lumpectomy and sentinel lymph node biopsy April 2005 for 4 mm, grade 1 tubular tumor (T1a No, stage IA), in the setting of DCIS. This was estrogen and progesterone receptor positive, HER-2 not amplified. Margins were ample. She participated in the MA-27 study and received anastrozole between June 2005 and June 2010, at which time she was released from follow-up.  More recently, Lametria had screening bilateral mammography showing a suspicious area in the right  breast and on 12/14/2014 underwent right diagnostic mammography at the breast Center. The breast density was category A. There was a group of pleomorphic calcifications in the upper central right breast spanning up to 2.8 cm. Biopsy of this area was obtained 12/21/2014 ([id number]) and showed ductal carcinoma in situ, grade 2 or 3, estrogen receptor 95% positive, and progesterone receptor 60% positive, both with strong staining intensity.  The patient's case was presented at the multidisciplinary breast cancer conference 01/02/2015. At that time a preliminary plan was proposed, namely genetics counseling and if no mutation noted breast MRI to be followed by lumpectomy, radiation, and anti-estrogens.  Her subsequent history is as detailed below.   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  Additionally, no variants of uncertain significance (VUSs) were found.  The Breast/Ovarian gene panel offered by [facility] [name], MD) includes sequencing and deletion/duplication analysis for the following 19 genes:  ATM, BARD1, BRCA1, BRCA2, BRIP1, CDH1, CHEK2, FANCC, MLH1, MSH2, MSH6, NBN, PALB2, PMS2, PTEN, RAD51C, RAD51D, TP53, and XRCC2.  This panel also includes deletion/duplication analysis (without sequencing) for one gene, EPCAM.  Date of report is January 14, 2015.      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/27/2015   Sarcoid 05/02/2021   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   Right proximal bridge of nose ([id number])   Seasonal allergies    Thyroid disease    Vaccine counseling 05/02/2021   Vitamin D deficiency     PAST SURGICAL HISTORY: 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   BREAST LUMPECTOMY Right 2016   BREAST LUMPECTOMY WITH NEEDLE LOCALIZATION Right 02/11/2015   Procedure: WIRE BRACKETED RIGHT BREAST LUMPECTOMY ;  Surgeon: Excell Seltzer, MD;  Location: [facility];  Service: General;  Laterality: Right;   CATARACT EXTRACTION, BILATERAL  10-05-2017 left ; 11-09-2017 right   Dr Kandy Garrison    colonoscopy with biopsy  12/28/2003   neg   EVACUATION BREAST HEMATOMA Left 03/11/2021   Procedure: LEFT AXILLARY SEROMA DRAINAGE AND DRAIN PLACEMENT;  Surgeon: Rolm Bookbinder, MD;  Location: [place];  Service: General;  Laterality: Left;   LYMPH NODE DISSECTION Right 02/23/2020   Procedure: right inguinal lymph node excision;  Surgeon: Alphonsa Overall, MD;  Location: [place];  Service: General;  Laterality: Right;   RADIOACTIVE SEED GUIDED AXILLARY SENTINEL LYMPH NODE Left 02/18/2021   Procedure: RADIOACTIVE SEED GUIDED LEFT AXILLARY LYMPH NODE EXCISION;  Surgeon:  Rolm Bookbinder, MD;  Location: [place];  Service: General;  Laterality: Left;   REVERSE SHOULDER ARTHROPLASTY Left 07/14/2018   Procedure: REVERSE SHOULDER ARTHROPLASTY;  Surgeon: Justice Britain, MD;  Location: [facility];  Service: Orthopedics;  Laterality: Left;  Interscalene Block   TONSILLECTOMY     1954   TOTAL KNEE ARTHROPLASTY Right 05/27/2015   Procedure: RIGHT TOTAL KNEE ARTHROPLASTY;  Surgeon: Gaynelle Arabian, MD;  Location: [facility];  Service: Orthopedics;  Laterality: Right;   TOTAL KNEE ARTHROPLASTY Left 05/20/2020   Procedure: TOTAL KNEE ARTHROPLASTY;  Surgeon: Gaynelle Arabian, MD;  Location: [facility];  Service: Orthopedics;  Laterality: Left;  86mn   WISDOM TOOTH EXTRACTION Bilateral 1965    FAMILY HISTORY Family History  Problem Relation Age of Onset   Dementia Mother    Other Mother 856      "spot on lung"; secondhand smoke exposure   Pulmonary fibrosis Father    Diabetes Brother    Thyroid disease Brother    Leukemia Brother 718      chronic lymphocytic    Stomach cancer Maternal Grandmother        dx. 350s  Heart attack Maternal Grandfather    Diabetes Paternal Grandfather    Stroke Paternal  Grandfather    Colitis Paternal Grandmother    Parkinson's disease Maternal Aunt    Diabetes Paternal Aunt    Other Paternal Aunt        bowel obstruction   Heart Problems Paternal Uncle    Ovarian cancer Maternal Aunt        dx. 70s; paternal half sister of her mother   Multiple myeloma Maternal Aunt    Multiple myeloma Maternal Aunt 75   Kidney failure Paternal Aunt    Pulmonary fibrosis Paternal Aunt    Stroke Paternal Aunt    Heart Problems Paternal Aunt    Emphysema Paternal Uncle    Heart Problems Paternal Uncle    Lung cancer Paternal Uncle        smoker   Colon cancer Cousin    Breast cancer Cousin        dx. 7s   Pulmonary fibrosis Remi Deter father died at the age of 37 from pulmonary fibrosis. Her mother died at age 49 with severe  dementia. The patient had one brother no sisters.  Her brother has Parkinson's disease and chronic lymphoid leukemia.  Her maternal grandmother died from stomach cancer the age of 59. The patient's mother had a half sister with ovarian cancer.   GYNECOLOGIC HISTORY:  No LMP recorded. Patient is postmenopausal.  menarche age 84, she isGX P0. Menopause 2003. Did not take hormone replacement   SOCIAL HISTORY:  Works as a Cabin crew, Publishing copy in renovations. She retired from [employer] as of 2018 and is now doing some Electronics engineer. At home it's her and Henriette Combs , who also is in real estate, with her own investment business.Katharine Look is a [employer], though not currently practicing    ADVANCED DIRECTIVES:  [name] is Chief Technology Officer of attorney   HEALTH MAINTENANCE: Social History   Tobacco Use   Smoking status: Former    Packs/day: 1.00    Years: 9.00    Total pack years: 9.00    Types: Cigarettes    Quit date: 06/16/1983    Years since quitting: 38.9   Smokeless tobacco: Never  Vaping Use   Vaping Use: Never used  Substance Use Topics   Alcohol use: Yes    Alcohol/week: 2.0 standard drinks of alcohol    Types: 2 Glasses of wine per week    Comment: Twice a week   Drug use: No     Colonoscopy:  20/15, [place] she knee  PAP: up-to-date/[facility]  Bone density: October 2016; normal  Lipid panel:  Allergies  Allergen Reactions   Levofloxacin Other (See Comments)    Affected tendons, severe pain and swelling   Tetracyclines & Related Swelling    tongue swelling and peeling   Other Diarrhea, Rash and Other (See Comments)    Crabs - diarrhea. Bay leaves - rash in her mouth   Anesthesia S-I-40 [Propofol]     Other reaction(s): respiratory failure requiring rescue meds   Ciprofloxacin     Tendon problems    Flonase [Fluticasone]     Nasal spray cause nose bleeds   Ibuprofen     Gets mouth ulcers if she takes for more than 3 days; Aleve okay    Nitrofurantoin Monohyd  Macro Hives   Praluent [Alirocumab]     Joint pain   Pravastatin     Muscle aches, rash, constipation on 78m every other day dosing   Rosuvastatin     Muscle and joint pain on 118mdaily dosing  Scopolamine     "Headache and redness of eyes"    Current Outpatient Medications  Medication Sig Dispense Refill   Calcium Carbonate-Vitamin D 600-400 MG-UNIT tablet 2 tablets     SYNTHROID 100 MCG tablet Take 100 mcg by mouth every morning.     Current Facility-Administered Medications  Medication Dose Route Frequency Provider Last Rate Last Admin   0.9 %  sodium chloride infusion   Intravenous PRN Eileen Stanford, PA-C        OBJECTIVE: White woman in no acute distress  There were no vitals filed for this visit.      There is no height or weight on file to calculate BMI.    ECOG FS:1 - Symptomatic but completely ambulatory  Physical Exam Constitutional:      Appearance: Normal appearance.  Cardiovascular:     Rate and Rhythm: Normal rate and regular rhythm.  Pulmonary:     Effort: Pulmonary effort is normal.     Breath sounds: Normal breath sounds.  Chest:     Comments: Bilateral breasts inspected.  Right breast status postlumpectomy twice with surgical scar.  No palpable masses or regional adenopathy.  Left breast normal to inspection and palpation Abdominal:     General: Abdomen is flat.     Palpations: Abdomen is soft.  Musculoskeletal:     Cervical back: Normal range of motion and neck supple. No rigidity.  Lymphadenopathy:     Cervical: No cervical adenopathy.  Skin:    General: Skin is warm and dry.  Neurological:     General: No focal deficit present.     Mental Status: She is alert.       RESULTS: CT Chest Wo Contrast  Result Date: 05/25/2022 CLINICAL DATA:  Follow-up lung nodules, sarcoidosis, history of breast cancer * Tracking Code: BO * EXAM: CT CHEST WITHOUT CONTRAST TECHNIQUE: Multidetector CT imaging of the chest was performed following the  standard protocol without IV contrast. RADIATION DOSE REDUCTION: This exam was performed according to the departmental dose-optimization program which includes automated exposure control, adjustment of the mA and/or kV according to patient size and/or use of iterative reconstruction technique. COMPARISON:  02/04/2022 FINDINGS: Cardiovascular: Aortic atherosclerosis. Normal heart size. Left coronary artery calcifications. No pericardial effusion. Mediastinum/Nodes: Significant interval enlargement of a left axillary lymph node measuring 3.2 x 1.5 cm, previously 1.8 x 0.7 cm (series 2, image 42). Additional surgical clips and postoperative scarring in the left axilla. Thyroid gland, trachea, and esophagus demonstrate no significant findings. Lungs/Pleura: New pulmonary nodule of the dependent right lower lobe measuring 0.5 cm (series 5, image 99). Additional new nodule of the deep right costophrenic recess measuring 0.6 cm (series 5, image 130). A previously seen new nodule of the posterior right upper lobe is resolved (series 5, image 34). Small pulmonary nodules of the posterior left upper lobe are not significantly changed, measuring up to 0.4 cm (series 5, image 52). Unchanged mild scarring of the bilateral lung bases. No pleural effusion or pneumothorax. Upper Abdomen: No acute abnormality. Musculoskeletal: Postoperative findings of right lumpectomy. No acute osseous findings. Status post left shoulder arthroplasty. IMPRESSION: 1. Significant interval enlargement of a left axillary lymph node, consistent with recurrent nodal metastatic disease. Additional surgical clips and postoperative scarring in the left axilla. 2. New pulmonary nodules of the dependent right lower lobe measuring 0.5 cm and 0.6 cm. A previously seen new nodule of the posterior right upper lobe is resolved. Small pulmonary nodules of the posterior left upper lobe  are not significantly changed, measuring up to 0.4 cm. Although as previously  reported, these small nodules are nonspecific and possibly infectious or inflammatory, given history of breast cancer, and especially given clear evidence of axillary nodal metastatic disease, these are highly suspicious for small metastases. Given small size and dependent location, new nodules in the right lung base are not likely amenable to FDG PET characterization for metabolic activity. 3. Coronary artery disease. These results will be called to the ordering clinician or representative by the Radiologist Assistant, and communication documented in the PACS or [employer]. Aortic Atherosclerosis (ICD10-I70.0). Electronically Signed   By: Delanna Ahmadi M.D.   On: 05/25/2022 15:46     CMP     Component Value Date/Time   NA 141 02/17/2022 0913   NA 141 12/30/2021 1137   K 4.3 02/17/2022 0913   CL 108 02/17/2022 0913   CO2 27 02/17/2022 0913   GLUCOSE 98 02/17/2022 0913   BUN 22 02/17/2022 0913   BUN 16 12/30/2021 1137   CREATININE 1.22 (H) 02/17/2022 0913   CALCIUM 9.8 02/17/2022 0913   PROT 6.4 (L) 02/17/2022 0913   PROT 6.3 12/30/2021 1137   ALBUMIN 4.3 02/17/2022 0913   ALBUMIN 4.5 12/30/2021 1137   AST 15 02/17/2022 0913   ALT 13 02/17/2022 0913   ALKPHOS 61 02/17/2022 0913   BILITOT 0.9 02/17/2022 0913   GFRNONAA 46 (L) 02/17/2022 0913   GFRAA 51 (L) 03/01/2020 1447   GFRAA 47 (L) 01/26/2020 1220    INo results found for: "SPEP", "UPEP"  Lab Results  Component Value Date   WBC 2.2 (L) 02/17/2022   NEUTROABS 0.9 (L) 02/17/2022   HGB 13.8 02/17/2022   HCT 39.1 02/17/2022   MCV 87.3 02/17/2022   PLT 90 (L) 02/17/2022      Chemistry      Component Value Date/Time   NA 141 02/17/2022 0913   NA 141 12/30/2021 1137   K 4.3 02/17/2022 0913   CL 108 02/17/2022 0913   CO2 27 02/17/2022 0913   BUN 22 02/17/2022 0913   BUN 16 12/30/2021 1137   CREATININE 1.22 (H) 02/17/2022 0913      Component Value Date/Time   CALCIUM 9.8 02/17/2022 0913   ALKPHOS 61 02/17/2022  0913   AST 15 02/17/2022 0913   ALT 13 02/17/2022 0913   BILITOT 0.9 02/17/2022 0913       Lab Results  Component Value Date   LABCA2 22 12/04/2008    No components found for: "LABCA125"  No results for input(s): "INR" in the last 168 hours.  Urinalysis    Component Value Date/Time   COLORURINE YELLOW 05/20/2015 1120   APPEARANCEUR CLEAR 05/20/2015 1120   LABSPEC 1.009 05/20/2015 1120   PHURINE 6.5 05/20/2015 1120   GLUCOSEU NEGATIVE 05/20/2015 1120   HGBUR NEGATIVE 05/20/2015 1120   BILIRUBINUR NEGATIVE 05/20/2015 1120   BILIRUBINUR neg 12/19/2014 1427   KETONESUR NEGATIVE 05/20/2015 1120   PROTEINUR NEGATIVE 05/20/2015 1120   UROBILINOGEN 0.2 12/19/2014 1427   NITRITE NEGATIVE 05/20/2015 1120   LEUKOCYTESUR NEGATIVE 05/20/2015 1120    STUDIES: CT Chest Wo Contrast  Result Date: 05/25/2022 CLINICAL DATA:  Follow-up lung nodules, sarcoidosis, history of breast cancer * Tracking Code: BO * EXAM: CT CHEST WITHOUT CONTRAST TECHNIQUE: Multidetector CT imaging of the chest was performed following the standard protocol without IV contrast. RADIATION DOSE REDUCTION: This exam was performed according to the departmental dose-optimization program which includes automated exposure control, adjustment of  the mA and/or kV according to patient size and/or use of iterative reconstruction technique. COMPARISON:  02/04/2022 FINDINGS: Cardiovascular: Aortic atherosclerosis. Normal heart size. Left coronary artery calcifications. No pericardial effusion. Mediastinum/Nodes: Significant interval enlargement of a left axillary lymph node measuring 3.2 x 1.5 cm, previously 1.8 x 0.7 cm (series 2, image 42). Additional surgical clips and postoperative scarring in the left axilla. Thyroid gland, trachea, and esophagus demonstrate no significant findings. Lungs/Pleura: New pulmonary nodule of the dependent right lower lobe measuring 0.5 cm (series 5, image 99). Additional new nodule of the deep right  costophrenic recess measuring 0.6 cm (series 5, image 130). A previously seen new nodule of the posterior right upper lobe is resolved (series 5, image 34). Small pulmonary nodules of the posterior left upper lobe are not significantly changed, measuring up to 0.4 cm (series 5, image 52). Unchanged mild scarring of the bilateral lung bases. No pleural effusion or pneumothorax. Upper Abdomen: No acute abnormality. Musculoskeletal: Postoperative findings of right lumpectomy. No acute osseous findings. Status post left shoulder arthroplasty. IMPRESSION: 1. Significant interval enlargement of a left axillary lymph node, consistent with recurrent nodal metastatic disease. Additional surgical clips and postoperative scarring in the left axilla. 2. New pulmonary nodules of the dependent right lower lobe measuring 0.5 cm and 0.6 cm. A previously seen new nodule of the posterior right upper lobe is resolved. Small pulmonary nodules of the posterior left upper lobe are not significantly changed, measuring up to 0.4 cm. Although as previously reported, these small nodules are nonspecific and possibly infectious or inflammatory, given history of breast cancer, and especially given clear evidence of axillary nodal metastatic disease, these are highly suspicious for small metastases. Given small size and dependent location, new nodules in the right lung base are not likely amenable to FDG PET characterization for metabolic activity. 3. Coronary artery disease. These results will be called to the ordering clinician or representative by the Radiologist Assistant, and communication documented in the PACS or [employer]. Aortic Atherosclerosis (ICD10-I70.0). Electronically Signed   By: Delanna Ahmadi M.D.   On: 05/25/2022 15:46     ASSESSMENT: 76 y.o. [place] woman  (1) status post right lumpectomy April 2005 for a pT1a pN0, stage IA invasive breast cancer, estrogen and progesterone receptor positive, HER-2 negative, in  the setting of DCIS;   (a) did not receive radiation  (b) status post anastrozole for 5 years completed June 2010  (2) status post right breast upper outer quadrant biopsy 12/21/2014 for ductal carcinoma in situ, grade 2 or 3, estrogen and progesterone receptor positive.  (3) Status post right lumpectomy 02/11/2015 showing ductal carcinoma in situ, intermediate grade, measuring 0.9 cm, with close but negative margins  (4) adjuvant radiation:   03/07/2015-04/04/2015    Right breast/ 42.5 Gy at 2.5 Gy per fraction x 17 fractions.   Right breast boost/ 7.5 Gy at 2.5 Gy per fraction x 3 fractions  (5) genetic testing 01/14/2015 through the [place] Panel offered by [facility] [name], MD) showed no deleterious mutations in ATM, BARD1, BRCA1, BRCA2, BRIP1, CDH1, CHEK2, FANCC, MLH1, MSH2, MSH6, NBN, PALB2, PMS2, PTEN, RAD51C, RAD51D, TP53, and XRCC2.  This panel also includes deletion/duplication analysis (without sequencing) for one gene, EPCAM.   (6) resumed anastrozole 07/06/2014, discontinued March 2017 with arthralgias and myalgias developing   (a) started letrozole 10/14/2015--discontinued after a brief course secondary to side effects  (b) Bone density 03/29/2015 was normal, with the lowest T score at 0.4  (7) additional  genetics testing 07/23/2020 on the [facility] Multi-Cancer Panel + Pulmonary Fibrosis Panel found no deleterious mutations in ABCA3, ACD, AIP, ALK, APC*, ATM*, AXIN2, BAP1, BARD1, BLM, BMPR1A, BRCA1, BRCA2, BRIP1, CASR, CDC73, CDH1, CDK4, CDKN1B, CDKN1C, CDKN2A (p14ARF), CDKN2A (p16INK4a), CEBPA, CHEK2, CTC1, CTNNA1, DICER1*, DIS3L2*, DKC1, EGFR, EPCAM*, FH*, FLCN, GATA2, GPC3*, GREM1*, HOXB13, HRAS, KIT, MAX*, MEN1*, MET*, MITF, MLH1*, MSH2*, MSH3*, MSH6*, MUTYH, NAF1, NBN, NF1*, NF2, NHP2, NOP10, NTHL1, PALB2, PARN, PDGFRA, PHOX2B*, PMS2*, POLD1*, POLE, POT1, PRKAR1A, PTCH1, PTEN*, RAD50, RAD51C, RAD51D, RB1*, RECQL4*, RET, RTEL1, RUNX1, SDHA*,  SDHAF2, SDHB, SDHC*, SDHD, SFTPC, SMAD4, SMARCA4, SMARCB1, SMARCE1, STK11, SUFU, TERC, TERT, TINF2, TMEM127, TP53, TSC1*, TSC2, USB1, VHL, WRAP53, WRN*, WT1.  (a) VUS in ALK identified called [id number].   (8) work-up for lymphadenopathy, likely sarcoidosis  (A) chest CT obtained to follow nonspecific pulmonary nodules 01/26/2020 shows left axillary and left celiac adenopathy.   (i)  HIV screen 03/01/2020 nonreactive, CRP was 1.1, angiotensin-converting enzyme was 56 (normal) with repeat 04/04/2021 mildly elevated at 71  (B) PET scan 02/08/2020 shows generalized lymphadenopathy, which is hypermetabolic, together with splenic enlargement  (C) right inguinal lymph node biopsy 02/77/4128 showed follicular hyperplasia and epithelioid granulomas, no morphologic or immunophenotypic evidence of lymphoma.  This was read as most consistent with a reactive process not excluding sarcoidosis  (D) chest CT scan 08/02/2020 again shows left axillary subpectoral celiac and retroperitoneal adenopathy as well as multiple very small scattered pulmonary nodules  (E) left axillary lymph node biopsy 11/27/2020 shows an atypical lymphoid proliferation with no B-cell monoclonal component.  (F) left axillary lymph node excision 02/18/2021 showed florid reactive lymphoid hyperplasia with no evidence of a lymphoproliferative process and no metastatic malignancy.  Sarcoidosis was not excluded nor was infection (leishmaniasis, toxoplasmosis etc.).  (G) infectious work-up under Dr. Tommy Medal October 2022 including QuantiFERON, histoplasma blastomyces and cryptococcal antigens and serologies for toxoplasmosis, RPR and CMV and EBV titers with PCR testing for bacteria fungi and mycobacteria showed no evidence of an infectious etiology.    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  Since last visit she had a CT chest which showed small lung nodules and she has a follow-up CT scan.   She recently had a mammogram which was unremarkable.  Physical examination today was unremarkable.  She had extensive work-up for her lymphadenopathy which was thought to be sarcoidosis after infectious disease work-up.  She will continue annual mammograms and return to clinic with [place] once a year per her request. She knows to call for any other issue that may develop before then. Applauded her for her weight loss, she is now starting some exercise regimen   Total time spent: 30 minutes  *Total Encounter Time as defined by the Centers for Medicare and Medicaid Services includes, in addition to the face-to-face time of a patient visit (documented in the note above) non-face-to-face time: obtaining and reviewing outside history, ordering and reviewing medications, tests or procedures, care coordination (communications with other health care professionals or caregivers) and documentation in the medical record.

## 2022-05-28 NOTE — Telephone Encounter (Signed)
Ok thanks. CLosing message

## 2022-05-29 ENCOUNTER — Encounter: Payer: Self-pay | Admitting: Internal Medicine

## 2022-05-29 ENCOUNTER — Other Ambulatory Visit: Payer: Self-pay | Admitting: *Deleted

## 2022-05-29 ENCOUNTER — Ambulatory Visit (INDEPENDENT_AMBULATORY_CARE_PROVIDER_SITE_OTHER): Payer: Medicare Other | Admitting: Internal Medicine

## 2022-05-29 ENCOUNTER — Other Ambulatory Visit: Payer: Self-pay | Admitting: Hematology and Oncology

## 2022-05-29 VITALS — BP 126/70 | HR 75 | Temp 98.1°F | Ht 64.0 in | Wt 204.4 lb

## 2022-05-29 DIAGNOSIS — R590 Localized enlarged lymph nodes: Secondary | ICD-10-CM

## 2022-05-29 DIAGNOSIS — R591 Generalized enlarged lymph nodes: Secondary | ICD-10-CM

## 2022-05-29 DIAGNOSIS — C50411 Malignant neoplasm of upper-outer quadrant of right female breast: Secondary | ICD-10-CM

## 2022-05-29 DIAGNOSIS — R911 Solitary pulmonary nodule: Secondary | ICD-10-CM

## 2022-05-29 DIAGNOSIS — I2584 Coronary atherosclerosis due to calcified coronary lesion: Secondary | ICD-10-CM | POA: Diagnosis not present

## 2022-05-29 DIAGNOSIS — I2510 Atherosclerotic heart disease of native coronary artery without angina pectoris: Secondary | ICD-10-CM

## 2022-05-29 DIAGNOSIS — R9389 Abnormal findings on diagnostic imaging of other specified body structures: Secondary | ICD-10-CM

## 2022-05-29 NOTE — Progress Notes (Signed)
OV 05/06/2020  Subjective:  Patient ID: Elizabeth Ramirez, female , DOB: 1946-04-28 , age 76 y.o. , MRN: [id number] , ADDRESS: [place] [address] PCP Cari Caraway, MD Patient Care Team: Cari Caraway, MD as PCP - General (Family Medicine) Magrinat, Virgie Dad, MD as Consulting Physician (Oncology) Excell Seltzer, MD (Inactive) as Consulting Physician (General Surgery) Bobbye Charleston, MD as Consulting Physician (Obstetrics and Gynecology) Justice Britain, MD as Consulting Physician (Orthopedic Surgery)  This Provider for this visit: Treatment Team:  Attending Provider: Brand Males, MD    05/06/2020 -   Chief Complaint  Patient presents with   Consult    Pt is being referred by Dr. [name] for possible sarcoidosis. Pt denies any complaints of cough, SOB, or CP.     HPI [name] 76 y.o. -  HPI 05/06/2020   -New consult evaluation.  Referred by Dr. [name]  76 year old female who is dealing with left knee issue and is awaiting a left knee replacement May 20, 2020 with Dr. [name].  She is under surveillance for breast cancer and right when she is going to be discharged from long-term follow-up lymphadenopathy was described and discovered.  Started off with a lung nodule discovery in the left lower lobe 11 mm along with axillary node discovery on a CT chest.  This resulted in PET scan that is described below.  There is diffuse uptake particularly in the abdomen and the liver and the splenic areas.  She also had a inguinal node.  This was then biopsied in September 2021 and showed granuloma.  Sarcoidosis under consideration but she has granulomatous adenitis.  She basically has no respiratory symptoms.  She been referred here for evaluation of this.  There is no hemoptysis or shortness of breath or wheezing or cough.  There is no weight loss or fever or chills or any B symptoms.  Currently she is most concerned about getting and going  through her left knee replacement.  Rest of the history is documented as a copy and paste from Dr. [name]  She is concerned about these findings.  Particularly she reports a strong family so pulmonary fibrosis in her dad and a paternal aunt and a paternal cousin who is the niece of the paternal aunt and her dad.   - IMPRESSION: CT chest January 26, 2020 11 x 7 mm irregular nodule in the superior segment left lower lobe, new. This is indeterminate but raises concern for primary bronchogenic neoplasm or metastasis. This is just at the size threshold for PET sensitivity. Consider PET-CT for further evaluation.   Mildly progressive left axillary/subpectoral nodes measuring up to 11 mm short axis. Contralateral nodal metastases would be unusual. Reactive nodes or low-grade lymphoproliferative disorder are also possible. This can be evaluated at the time of PET-CT.   10 mm short axis left celiac axis node, also minimally progressive, warranting attention at the time of PET-CT.   Aortic Atherosclerosis (ICD10-I70.0) and Emphysema (ICD10-J43.9).     Electronically Signed   By: [name] M.D.   On: 01/26/2020 16:22   PET scan February 08, 2020  IMPRESSION: 1. Generalized nodal enlargement and or hypermetabolic lymph nodes with splenic enlargement and increased metabolic activity findings are most suggestive of lymphoproliferative disorder/lymphoma. RIGHT groin lymph node with considerable FDG uptake may be the best target for sampling. Would avoid LEFT axillary lymph nodes given that the patient reportedly has had recent COVID-19 vaccination which can be confounding on PET scan.  Correlate with side of vaccine administration. Metastatic disease is also considered though would be unusual in the setting of breast cancer with pelvic node predominant disease and splenic enlargement with mild hypermetabolism. 2. Small lymph nodes in the neck and potential RIGHT parotid lesion, this  could be related but would suggest dedicated assessment of the parotid gland for further evaluation, this could be performed with CT or ultrasound as an initial means in further evaluating this area. 3. Small lung nodule in the LEFT lower lobe is nonspecific in terms of FDG uptake suggesting indolent process. Morphologic features raising the question of bronchogenic neoplasm as outlined previously. 4. Mild increased FDG uptake in the RIGHT breast may relate to recent biopsy at the site of previous surgery, refer to biopsy results and mammography for further information. 5. Lobular hepatic contours could also be seen in the setting of liver disease, correlate with any clinical or laboratory evidence of liver disease. Liver disease could be an alternative explanation for splenic enlargement. 6. Activation of brown fat along the spine and potentially in the area of the occipital region and posterior neck.     Electronically Signed   By: [name] M.D.   On: 02/08/2020 16:59   A. LYMPH NODE, RIGHT INGUINAL, BIOPSY: February 23, 2020 -  Lymph node with follicular hyperplasia, scattered giant cells and  epithelioid granulomas  -  No morphologic or immunophenotypic evidence of lymphoma  -  See comment   Famil hx Sanders father died at the age of 84 from pulmonary fibrosis.  A paternal aunt also had pulmonary fibrosis.  A paternal cousin also has pulmonary fibrosis.  Her mother died at age 11 with severe dementia. The patient had one brother no sisters. Her maternal grandmother died from stomach cancer the age of 75. The patient's mother had a half sister with ovarian cancer.    SOCIAL HISTORY:  Works as a Cabin crew, Publishing copy in renovations. She retired from [employer] as of 2018 and is now doing some Electronics engineer. At home it's her and Elizabeth Ramirez , who also is in real estate, with her own investment business..    GENETIC clinic 07/24/20  GENETIC TEST RESULTS: Genetic testing  reported out on 07/23/2020 through the [facility] Multi-Cancer Panel + Pulmonary Fibrosis panel found no pathogenic mutations.    The Multi-Cancer Panel + Pulmonary Fibrosis panel offered by [facility] includes sequencing and/or deletion duplication testing of the following 97 genes: ABCA3, ACD, AIP, ALK, APC*, ATM*, AXIN2, BAP1, BARD1, BLM, BMPR1A, BRCA1, BRCA2, BRIP1, CASR, CDC73, CDH1, CDK4, CDKN1B, CDKN1C, CDKN2A (p14ARF), CDKN2A (p16INK4a), CEBPA, CHEK2, CTC1, CTNNA1, DICER1*, DIS3L2*, DKC1, EGFR, EPCAM*, FH*, FLCN, GATA2, GPC3*, GREM1*, HOXB13, HRAS, KIT, MAX*, MEN1*, MET*, MITF, MLH1*, MSH2*, MSH3*, MSH6*, MUTYH, NAF1, NBN, NF1*, NF2, NHP2, NOP10, NTHL1, PALB2, PARN, PDGFRA, PHOX2B*, PMS2*, POLD1*, POLE, POT1, PRKAR1A, PTCH1, PTEN*, RAD50, RAD51C, RAD51D, RB1*, RECQL4*, RET, RTEL1, RUNX1, SDHA*, SDHAF2, SDHB, SDHC*, SDHD, SFTPC, SMAD4, SMARCA4, SMARCB1, SMARCE1, STK11, SUFU, TERC, TERT, TINF2, TMEM127, TP53, TSC1*, TSC2, USB1, VHL, WRAP53, WRN*, WT1    OV 08/06/2020  Subjective:  Patient ID: Elizabeth Ramirez, female , DOB: 10/24/1945 , age 57 y.o. , MRN: [id number] , ADDRESS: [address] [place] [id number] PCP Cari Caraway, MD Patient Care Team: Cari Caraway, MD as PCP - General (Family Medicine) Magrinat, Virgie Dad, MD as Consulting Physician (Oncology) Excell Seltzer, MD (Inactive) as Consulting Physician (General Surgery) Bobbye Charleston, MD as Consulting Physician (Obstetrics and Gynecology) Justice Britain, MD as Consulting Physician (Orthopedic  Surgery)  This Provider for this visit: Treatment Team:  Attending Provider: Brand Males, MD    08/06/2020 -   Chief Complaint  Patient presents with   Follow-up    Doing well, no issues   Multiple pulmonary nodules in the setting of breast cancer and also family history of pulmonary fibrosis and also right inguinal lymphadenopathy September 2021 there is granuloma  HPI JAKALA HERFORD 76 y.o. -returns for follow-up.  In the interim we did testing for granuloma that includes QuantiFERON gold angiotensin-converting enzyme. These are normal. Immunoglobulins are normal. In the interim she has had a left knee surgery. Her mobility has improved. Her shortness of breath is not there. She feels happier. She had CT scan of the chest in February 2022 that I personally visualized with her. This is for multiple pulmonary nodules. The concerning left lower lobe nodule is resolved. The small left upper lobe nodule persists. There is also multiple 5 mm nodules. These are all n in comparison to August 2021. She has a history of breast cancer. Her surgeon Dr. Lucia Gaskins is retired. She wants to see Dr. Donne Hazel. She otherwise feels good. She had genetic testing for pulmonary fibrosis and this was reviewed with a genetics counselor by email discussion. In addition I reviewed the note and this was noncontributory. It is documented above. There is no coughing or wheezing. She is somewhat reluctant to do daily steroids.  Other findings on the CT scan of the chest include coronary artery calcification which I am addressing for the first time.  She has air trapping but she is asymptomatic.  She has pulmonary artery enlargement but she is asymptomatic. CT Chest data  CT Chest High Resolution  Result Date: 08/02/2020 CLINICAL DATA:  Follow-up lung nodule, history of right breast cancer EXAM: CT CHEST WITHOUT CONTRAST TECHNIQUE: Multidetector CT imaging of the chest was performed following the standard protocol without intravenous contrast. High resolution imaging of the lungs, as well as inspiratory and expiratory imaging, was performed. COMPARISON:  CT chest, 01/26/2020, PET-CT, 02/08/2020 FINDINGS: Cardiovascular: Left coronary artery calcifications. Normal heart size. No pericardial effusion. Enlargement of the main pulmonary artery measuring up to 3.6 cm. Mediastinum/Nodes: Left axillary and subpectoral lymph nodes are enlarged compared to prior  examination, an index axillary node measuring 2.1 x 1.6 cm, previously 1.7 x 1.1 cm when measured similarly (series 2, image 21). Thyroid gland, trachea, and esophagus demonstrate no significant findings. Lungs/Pleura: Interval resolution of previously seen spiculated nodule of the superior segment left lower lobe (series 4, image 124). Unchanged spiculated nodule in the anterior left upper lobe measuring 5 mm (series 4, image 115). There are multiple new scattered small pulmonary nodules, for example a 5 mm nodule of the peripheral left upper lobe (series 4, image 150) and a 3 mm pulmonary nodule of the left lung base (series 4, image 239). Minimal subpleural radiation fibrosis of the anterior right lung. Mild, lobular air trapping on expiratory phase imaging. No pleural effusion or pneumothorax. Upper Abdomen: No acute abnormality. Multiple celiac axis and retroperitoneal lymph nodes in the partially included upper abdomen are increased in size compared to prior examination, an index left retroperitoneal node measuring 1.2 x 1.0 cm, previously 0.6 x 0.6 cm (series 2, image 166). Musculoskeletal: No chest wall mass or suspicious bone lesions identified. Postoperative findings of prior right lumpectomy. IMPRESSION: 1. Interval resolution of previously seen spiculated nodule of the superior segment left lower lobe, consistent with resolved nonspecific infection or inflammation. 2. Unchanged spiculated  nodule in the anterior left upper lobe measuring 5 mm, likely benign given established stability. 3. There are multiple new scattered small pulmonary nodules, measuring 5 mm or smaller. These are nonspecific and statistically most likely infectious or inflammatory, although given history of malignancy, metastatic disease is not excluded. 4. Interval enlargement of left axillary, subpectoral, celiac axis, and retroperitoneal lymph nodes, which remain generally concerning for lymphoma or metastatic disease. 5. Mild,  lobular air trapping on expiratory phase imaging, consistent with small airways disease. 6. Enlargement of the main pulmonary artery, as can be seen in pulmonary hypertension. 7. Coronary artery disease. Electronically Signed   By: Eddie Candle M.D.   On: 08/02/2020 15:25      PFT  OV 01/31/2021  Subjective:  Patient ID: Elizabeth Ramirez, female , DOB: [birth year] , age 76 y.o. , MRN: [id number] , ADDRESS: [address] [place] [id number] PCP Cari Caraway, MD Patient Care Team: Cari Caraway, MD as PCP - General (Family Medicine) Magrinat, Virgie Dad, MD as Consulting Physician (Oncology) Excell Seltzer, MD (Inactive) as Consulting Physician (General Surgery) Bobbye Charleston, MD as Consulting Physician (Obstetrics and Gynecology) Justice Britain, MD as Consulting Physician (Orthopedic Surgery) Rolm Bookbinder, MD as Consulting Physician (General Surgery)  This Provider for this visit: Treatment Team:  Attending Provider: Brand Males, MD    01/31/2021 -   Chief Complaint  Patient presents with   Follow-up    Doing well, no issues     HPI Elizabeth Ramirez 76 y.o. -p presents for follow-up.  And CT scan of the chest in February 2022 showed resolution of the left lower lobe lymph node from 2021 August but she had numerous other multiple tiny nodules including left upper lobe lymph nodes.  We ordered a CT scan of the chest today but the results are pending.  Upon my personal visualization it looks okay.    Of note she did have an enlarged left axillary lymph node and I sent her to surgeon .  I reviewed the record.  And once end of June 2022 she had a needle biopsy that showed abnormal T cells.  She is now scheduled for excision biopsy on 02/18/2021.  In the interim she will see Dr. [name] her oncologist.  Also referred her to cardiology for coronary artery calcification but I do not know if she is seeing cardiology.  She is denying any chest pain or shortness of  breath and overall she is reporting good health.  No results found.    Background history from the oncologist -in September 2021   (1) status post right lumpectomy April 2005 for a pT1a pN0, stage IA invasive breast cancer, estrogen and progesterone receptor positive, HER-2 negative, in the setting of DCIS;              (a) did not receive radiation             (b) status post anastrozole for 5 years completed June 2010   (2) status post right breast upper outer quadrant biopsy 12/21/2014 for ductal carcinoma in situ, grade 2 or 3, estrogen and progesterone receptor positive.   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   Right breast boost/ 7.5 Gy at 2.5 Gy per fraction x 3 fractions   (  5) genetic testing 01/14/2015 through the Register offered by [facility] [name], MD) showed no deleterious mutations in ATM, BARD1, BRCA1, BRCA2, BRIP1, CDH1, CHEK2, FANCC, MLH1, MSH2, MSH6, NBN, PALB2, PMS2, PTEN, RAD51C, RAD51D, TP53, and XRCC2.  This panel also includes deletion/duplication analysis (without sequencing) for one gene, EPCAM.    (6) resumed anastrozole 07/06/2014, discontinued March 2017 with arthralgias and myalgias developing              (a) started letrozole 10/14/2015--discontinued after a brief course secondary to side effects             (b) Bone density 03/29/2015 was normal, with the lowest T score at 0.4    Kloe is now just over 5 years out from definitive surgery for her breast cancer with no evidence of disease recurrence.  This is favorable.   We were preparing to discharge her from follow-up when she was found to have diffuse lymphadenopathy.  We obtained a lymph node biopsy which shows no evidence of breast  cancer or lymphoma.  There are granulomas but no evidence of tuberculosis or other chronic infection.  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  8) possible lymphoproliferative disease:               (A) continue CT obtained to follow nonspecific pulmonary nodules 01/26/2020 shows left axillary and left celiac adenopathy.             (B) PET scan 02/08/2020 shows generalized lymphadenopathy, which is hypermetabolic, together with splenic enlargement             (C) right inguinal lymph node biopsy [id number]/48/0165 showed follicular hyperplasia and epithelioid granulomas, no morphologic or immunophenotypic evidence of lymphoma.  This was read as most consistent with a reactive process without excluding sarcoidosis  (D) chest CT scan 08/02/2020 again shows left axillary subpectoral celiac and retroperitoneal adenopathy as well as multiple very small scattered pulmonary nodules             (E) left axillary lymph node biopsy 11/27/2020 shows an atypical lymphoid proliferation with no B-cell monoclonal component.   OV 05/23/2021  Subjective:  Patient ID: Elizabeth Ramirez, female , DOB: [birth year] , age 12 y.o. , MRN: [id number] , ADDRESS: [address] [place] [address] PCP Cari Caraway, MD Patient Care Team: Cari Caraway, MD as PCP - General (Family Medicine) Magrinat, Virgie Dad, MD as Consulting Physician (Oncology) Excell Seltzer, MD (Inactive) as Consulting Physician (General Surgery) Bobbye Charleston, MD as Consulting Physician (Obstetrics and Gynecology) Justice Britain, MD as Consulting Physician (Orthopedic Surgery) Rolm Bookbinder, MD as Consulting Physician (General Surgery)  This Provider for this visit: Treatment Team:  Attending Provider: Brand Males, MD    05/23/2021 -   Chief Complaint  Patient presents with   Follow-up    Pt states she has been doing okay since last visit and denies any complaints.     HPI DEZAREE TRACEY 76 y.o.  -returns for follow-up.  Last seen in August 2022.  After that she had a CT scan of the chest and on the CT scan her left axillary lymph node had enlarged even further.  She also had interval development of new right upper lobe micro pulmonary nodules less than 5 mm.  Therefore she ended up getting left axillary lymph node biopsy.  The report is below.  She has lymphocyte proliferation with epithelioid cells.  Almost a pre-granuloma experience.  She did see infectious  diseases Dr. Drucilla Schmidt.  After that she saw Dr. [name] who is retiring soon.  It was her last visit with him.  She is very sad that he is retiring.  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  It is not malignancy.  Therefore she has been asked to reestablish care.  In terms of her symptoms she is intentionally lost weight because of diet.  She continues to be asymptomatic without any respiratory issues.  No shortness of breath no cough no hemoptysis no chills no fever.  She feels good.  Overall she prefers an expectant approach.  She says there is a strong family history of adverse reactions to steroids.  Family member got admitted to psychiatric hospital.  She herself has had anxiety with steroids.  She does not want to do steroids.  We discussed the possibility that this could be sarcoid.  We discussed the natural history of sarcoid.  Discussed intention to treat being when sarcoid fixed organs such as the cardiac or brain or kidney or liver or progressive in the lung with symptoms.  She is understanding of this.  Discussed methotrexate as an option if steroids do not work or is a contraindication or patient having side effects.  She verbalized understanding.  Currently shared decision making for an expectant approach.    CT Chest data Augt 2022 since feb 202  IMPRESSION: Stable left upper lobe pulmonary nodule, safely considered benign.  Lungs/Pleura: The previously identified pulmonary nodule within the left  upper lobe appears stable, measuring 5 mm at axial image # 67/5. However, multiple additional pulmonary nodules have developed measuring 4-5 mm in size seen within the right upper lobe at axial image # 62/5, and left lower lobe at axial image # 78/5, indeterminate. No confluent pulmonary infiltrate. No pneumothorax or pleural effusion. Central airways are widely patent.   Interval development of multiple new 4-5 mm pulmonary nodules within the right upper lobe and right lower lobe, indeterminate. No follow-up needed if patient is low-risk (and has no known or suspected primary neoplasm). Non-contrast chest CT can be considered in 12 months if patient is high-risk. This recommendation follows the consensus statement: Guidelines for Management of Incidental Pulmonary Nodules Detected on CT Images: From the Fleischner Society 2017; Radiology 2017; 284:228-243.   Progressive pathologic adenopathy within the left axilla. Interval biopsy of the dominant lymph node within the left axilla with biopsy marker placed. Stable left periaortic lymphadenopathy within the visualized upper abdomen.   Mild coronary artery calcification. Morphologic changes in keeping with pulmonary arterial hypertension.   Aortic Atherosclerosis (ICD10-I70.0).     Electronically Signed   By: Fidela Salisbury M.D.   On: 02/02/2021 02:19  No results found.  Axillary bxx 02/18/21  FINAL MICROSCOPIC DIAGNOSIS:   A. LYMPH NODE, LEFT AXILLARY, DISSECTION:  -Florid lymphoid hyperplasia  -See comment   COMMENT:   Sections of all lymph nodes show preservation of the architecture with  open sinuses.  The lymphoid tissue displays florid, primarily follicular  hyperplasia characterized by numerous variably sized lymphoid follicles  with reactive appearing germinal centers.  Many of the germinal center  show attenuated or absent mantle zones.  Scattered small foci of  monocytoid cells are seen adjacent to some follicles  most compatible  with areas of monocytoid B-cell hyperplasia.  In some areas there is  also paracortical hyperplasia associated with scattered variably sized  clusters of epithelioid histiocytes. No metastatic malignancy is  identified.  Immunohistochemical stains including CD20, CD3,  CD10 and  BCL6 2 were performed on a representative block and show that the  lymphoid follicles are positive for CD20 and CD10 and negative for  Bcl-2.  The lymphoid follicles are surrounded by abundant T cells as  seen with CD3.  The overall features are consistent with florid reactive  lymphoid hyperplasia. There is no evidence to suggest a  lymphoproliferative process.  The presence of scattered clusters of  epithelioid histiocytes in association with florid follicular  hyperplasia may be the result of previous biopsy/procedure, sarcoidosis,  or infection such as leishmaniasis, toxoplasmosis, etc.  Clinical and  serologic evaluation is recommended.   PFT  No flowsheet data found.     OV 05/29/2022  Subjective:  Patient ID: Elizabeth Ramirez, female , DOB: [birth year] , age 14 y.o. , MRN: [id number] , ADDRESS: [place] [id number] PCP Cari Caraway, MD Patient Care Team: Cari Caraway, MD as PCP - General (Family Medicine) Nahser, Wonda Cheng, MD as PCP - Cardiology (Cardiology) Magrinat, Virgie Dad, MD (Inactive) as Consulting Physician (Oncology) Excell Seltzer, MD (Inactive) as Consulting Physician (General Surgery) Bobbye Charleston, MD as Consulting Physician (Obstetrics and Gynecology) Justice Britain, MD as Consulting Physician (Orthopedic Surgery) Rolm Bookbinder, MD as Consulting Physician (General Surgery)  This Provider for this visit: Treatment Team:  Attending Provider: Brand Males, MD  Multiple pulmonary nodules in the setting of breast cancer and also family history of pulmonary fibrosis and also right inguinal lymphadenopathy September 2021 there is  granuloma and left axillary node pre granuloma September 2022  05/29/2022 -   Chief Complaint  Patient presents with   Follow-up    Pt is here following up after recent CT.  Pt states she has been doing okay since last visit.     HPI Elizabeth Ramirez 76 y.o. -returns for follow-up.  She is asymptomatic from a respiratory standpoint.  In August 2023 she had a CT scan of the chest and there are some concern about her lung nodules a repeat CT scan of the chest has been done per radiology recommendation at the time 0.3-6 months which was completed December 2023.  In this December 2023 CT scan the left axillary lymph node is suddenly and rapidly enlarged.  Radiology is very concerned about metastatic disease.  In talking to her she is asymptomatic.  She got 4 different vaccines to her right shoulder with local reactions of rash each time week.  This was all in the fall 2023 between the August 2023 and December 2023 Scans.  She is wondering if this lymph node enlargement on the contralateral left side is in response to those injections on the right shoulder.  I told her we would keep an open mind on this it is quite possible.  I have alerted her oncologist was ordered a PET scan today.  The oncologist has agreed to follow-up on the axillary node issue.    CT Chest data 05/25/22   Narrative & Impression  CLINICAL DATA:  Follow-up lung nodules, sarcoidosis, history of breast cancer * Tracking Code: BO *   EXAM: CT CHEST WITHOUT CONTRAST   TECHNIQUE: Multidetector CT imaging of the chest was performed following the standard protocol without IV contrast.   RADIATION DOSE REDUCTION: This exam was performed according to the departmental dose-optimization program which includes automated exposure control, adjustment of the mA and/or kV according to patient size and/or use of iterative reconstruction technique.   COMPARISON:  02/04/2022   FINDINGS: Cardiovascular: Aortic atherosclerosis.  Normal heart size. Left coronary artery calcifications. No pericardial effusion.   Mediastinum/Nodes: Significant interval enlargement of a left axillary lymph node measuring 3.2 x 1.5 cm, previously 1.8 x 0.7 cm (series 2, image 42). Additional surgical clips and postoperative scarring in the left axilla. Thyroid gland, trachea, and esophagus demonstrate no significant findings.   Lungs/Pleura: New pulmonary nodule of the dependent right lower lobe measuring 0.5 cm (series 5, image 99). Additional new nodule of the deep right costophrenic recess measuring 0.6 cm (series 5, image 130). A previously seen new nodule of the posterior right upper lobe is resolved (series 5, image 34). Small pulmonary nodules of the posterior left upper lobe are not significantly changed, measuring up to 0.4 cm (series 5, image 52). Unchanged mild scarring of the bilateral lung bases. No pleural effusion or pneumothorax.   Upper Abdomen: No acute abnormality.   Musculoskeletal: Postoperative findings of right lumpectomy. No acute osseous findings. Status post left shoulder arthroplasty.   IMPRESSION: 1. Significant interval enlargement of a left axillary lymph node, consistent with recurrent nodal metastatic disease. Additional surgical clips and postoperative scarring in the left axilla. 2. New pulmonary nodules of the dependent right lower lobe measuring 0.5 cm and 0.6 cm. A previously seen new nodule of the posterior right upper lobe is resolved. Small pulmonary nodules of the posterior left upper lobe are not significantly changed, measuring up to 0.4 cm. Although as previously reported, these small nodules are nonspecific and possibly infectious or inflammatory, given history of breast cancer, and especially given clear evidence of axillary nodal metastatic disease, these are highly suspicious for small metastases. Given small size and dependent location, new nodules in the right lung base are not  likely amenable to FDG PET characterization for metabolic activity. 3. Coronary artery disease.   These results will be called to the ordering clinician or representative by the Radiologist Assistant, and communication documented in the PACS or [employer].   Aortic Atherosclerosis (ICD10-I70.0).     Electronically Signed   By: Delanna Ahmadi M.D.   On: 05/25/2022 15:46        No results found.    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), Family history of breast cancer, Family history of multiple myeloma, Family history of ovarian cancer, Family history of pulmonary fibrosis, Family history of stomach cancer, Genetic testing (01/18/2015), Heart murmur, History of kidney stones, Hypothyroid (07/29/2011), Hypothyroidism, Malignant neoplasm of upper-outer quadrant of right breast in female, estrogen receptor positive ([place]) (01/14/2015), Multiple allergies (04/04/2021), 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   reports that she quit smoking about 38 years ago. Her smoking use included cigarettes. She has a 9.00 pack-year smoking history. She has never used smokeless tobacco.  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   BREAST LUMPECTOMY Right 2016   BREAST LUMPECTOMY WITH NEEDLE LOCALIZATION Right 02/11/2015   Procedure: WIRE BRACKETED  RIGHT BREAST LUMPECTOMY ;  Surgeon: Excell Seltzer, MD;  Location: [place];  Service: General;  Laterality: Right;   CATARACT EXTRACTION, BILATERAL  10-05-2017 left ; 11-09-2017 right   Dr Kandy Garrison    colonoscopy with biopsy  12/28/2003   neg   EVACUATION BREAST HEMATOMA Left 03/11/2021   Procedure: LEFT AXILLARY SEROMA DRAINAGE AND DRAIN PLACEMENT;  Surgeon: Rolm Bookbinder, MD;  Location: [place];  Service: General;  Laterality: Left;   LYMPH NODE DISSECTION Right 02/23/2020   Procedure: right inguinal lymph node excision;  Surgeon: Alphonsa Overall, MD;  Location: [place];  Service: General;  Laterality: Right;   RADIOACTIVE SEED GUIDED AXILLARY SENTINEL LYMPH NODE Left 02/18/2021   Procedure: RADIOACTIVE SEED GUIDED LEFT AXILLARY LYMPH NODE EXCISION;  Surgeon: Rolm Bookbinder, MD;  Location: [place];  Service: General;  Laterality: Left;   REVERSE SHOULDER ARTHROPLASTY Left 07/14/2018   Procedure: REVERSE SHOULDER ARTHROPLASTY;  Surgeon: Justice Britain, MD;  Location: [facility];  Service: Orthopedics;  Laterality: Left;  Interscalene Block   TONSILLECTOMY     1954   TOTAL KNEE ARTHROPLASTY Right 05/27/2015   Procedure: RIGHT TOTAL KNEE ARTHROPLASTY;  Surgeon: Gaynelle Arabian, MD;  Location: [facility];  Service: Orthopedics;  Laterality: Right;   TOTAL KNEE ARTHROPLASTY Left 05/20/2020   Procedure: TOTAL KNEE ARTHROPLASTY;  Surgeon: Gaynelle Arabian, MD;  Location: [facility];  Service: Orthopedics;  Laterality: Left;  69mn   WISDOM TOOTH EXTRACTION Bilateral 1965    Allergies  Allergen Reactions   Levofloxacin Other (See Comments)    Affected tendons, severe pain and swelling   Tetracyclines & Related Swelling    tongue swelling and peeling   Other Diarrhea, Rash and Other (See Comments)    Crabs - diarrhea. Bay leaves - rash in her mouth   Anesthesia S-I-40 [Propofol]     Other reaction(s): respiratory failure requiring  rescue meds   Ciprofloxacin     Tendon problems    Flonase [Fluticasone]     Nasal spray cause nose bleeds   Ibuprofen     Gets mouth ulcers if she takes for more than 3 days; Aleve okay    Nitrofurantoin Monohyd Macro Hives   Praluent [Alirocumab]     Joint pain   Pravastatin     Muscle aches, rash, constipation on 254mevery other day dosing   Rosuvastatin     Muscle and joint pain on 105maily dosing   Scopolamine     "Headache and redness of eyes"    Immunization History  Administered Date(s) Administered   Fluad Quad(high Dose 65+) 04/04/2021   Influenza Split 03/15/2008, 02/22/2015, 03/16/2020, 04/04/2021   Influenza, High Dose Seasonal PF 03/24/2017, 04/01/2018, 02/09/2019, 02/15/2020   Influenza,inj,Quad PF,6+ Mos 04/06/2012   Influenza,inj,quad, With Preservative 03/06/2014, 03/19/2016, 02/13/2017, 03/15/2018, 03/16/2019   PFIZER(Purple Top)SARS-COV-2 Vaccination 07/07/2019, 07/28/2019, 03/12/2020, 09/25/2020, 03/18/2021   Pneumococcal Conjugate-13 03/14/2015   Pneumococcal Polysaccharide-23 05/23/2010, 03/24/2017   Td 07/30/2003   Tdap 07/29/2009, 05/02/2021   Zoster, Live 07/29/2005, 04/19/2020, 01/17/2021    Family History  Problem Relation Age of Onset   Dementia Mother    Other Mother 81 45    "spot on lung"; secondhand smoke exposure   Pulmonary fibrosis Father    Diabetes Brother    Thyroid disease Brother    Leukemia Brother 70 29    chronic lymphocytic    Stomach cancer Maternal Grandmother        dx. 30s74s  Heart attack Maternal Grandfather    Diabetes Paternal Grandfather    Stroke Paternal Grandfather    Colitis Paternal Grandmother    Parkinson's disease Maternal Aunt    Diabetes Paternal Aunt    Other Paternal Aunt        bowel obstruction   Heart Problems Paternal Uncle    Ovarian cancer Maternal Aunt        dx. 61s; paternal half sister of her mother   Multiple myeloma Maternal Aunt    Multiple myeloma Maternal Aunt 75   Kidney  failure Paternal Aunt    Pulmonary fibrosis Paternal Aunt    Stroke Paternal Aunt    Heart Problems Paternal Aunt    Emphysema Paternal Uncle    Heart Problems Paternal Uncle    Lung cancer Paternal Uncle        smoker   Colon cancer Cousin    Breast cancer Cousin        dx. 35s   Pulmonary fibrosis Cousin      Current Outpatient Medications:    Calcium Carbonate-Vitamin D 600-400 MG-UNIT tablet, 2 tablets, Disp: , Rfl:    SYNTHROID 100 MCG tablet, Take 100 mcg by mouth every morning., Disp: , Rfl:   Current Facility-Administered Medications:    0.9 %  sodium chloride infusion, , Intravenous, PRN, Eileen Stanford, PA-C      Objective:   Vitals:   05/29/22 0948  BP: 126/70  Pulse: 75  Temp: 98.1 F (36.7 C)  TempSrc: Oral  SpO2: 95%  Weight: 204 lb 6.4 oz (92.7 kg)  Height: _0  (1.626 m)    Estimated body mass index is 35.09 kg/m as calculated from the following:   Height as of this encounter: _1  (1.626 m).   Weight as of this encounter: 204 lb 6.4 oz (92.7 kg).  _2 @  Filed Weights   05/29/22 0948  Weight: 204 lb 6.4 oz (92.7 kg)     Physical Exam    General: No distress. Obese. Looks well Neuro: Alert and Oriented x 3. GCS 15. Speech normal Psych: Pleasant Resp:  Barrel Chest - no.  Wheeze - no, Crackles - no, No overt respiratory distress CVS: Normal heart sounds. Murmurs - no Ext: Stigmata of Connective Tissue Disease - no HEENT: Normal upper airway. PEERL +. No post nasal drip        Assessment:       ICD-10-CM   1. Axillary lymphadenopathy  R59.0     2. Pulmonary nodule  R91.1     3. Lymphadenopathy  R59.1      Differential diagnosis for the left axillary lymph node enlargement is either breast cancer or local reaction to vaccines on the right shoulder [contralateral side].  Oncology will follow.     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 COuld be local reaction to 4 vaccines on contralateral right shoulder between Aug-Dec 2023   Plan -PET Scan and followup per Dr Meribeth Mattes   Pulmonary nodules - multiple   -CT scan of the chest  DEc 2023 - RUL 0.55cm new \- RUL post segment is resolved -  LUL post segment small  <0.4cm stable   Plan - repeat CT chest without contrast  in 6  months  Family history of pulmonary fibrosis  CT scan of the chest Dec 2023 shows no evidence of pulmonary fibrosis -Genetic testing for pulmonary fibrosis is noncontributory in January 2022 -Glad you are feeling well without any shortness of breath or cough  Plan -Expectant follow-up     Follow-up - 6 monmths CT chest; 15 min visit    SIGNATURE    Dr. Brand Males, M.D., F.C.C.P,  Pulmonary and Critical Care Medicine Staff Physician, [facility] [place] Director - Interstitial Lung Disease  Program  Pulmonary [place] at [place], [place], [address]  Pager: [phone], If no answer or between  15:00h - 7:00h: call [phone] Telephone: [phone]  10:35 AM 05/29/2022

## 2022-05-29 NOTE — Patient Instructions (Addendum)
Axillary lymphadenopathy Lymphadenopathy Granulomatous adenopathy - right inguinal node sep 2021 and pre-granuloma of left axilla Sept 2022 History of breast cancer  -There is enlargement of the left axillary lymph node in Dec 2023 that is signifciant compared to Aug 2023. COuld be local reaction to 4 vaccines on contralateral right shoulder between Aug-Dec 2023   Plan -PET Scan and followup per Dr Meribeth Mattes   Pulmonary nodules - multiple   -CT scan of the chest  DEc 2023 - RUL 0.55cm new \- RUL post segment is resolved -  LUL post segment small  <0.4cm stable   Plan - repeat CT chest without contrast  in 6 months  Family history of pulmonary fibrosis  CT scan of the chest Dec 2023 shows no evidence of pulmonary fibrosis -Genetic testing for pulmonary fibrosis is noncontributory in January 2022 -Glad you are feeling well without any shortness of breath or cough  Plan -Expectant follow-up     Follow-up - 6 monmths CT chest; 15 min visit

## 2022-05-29 NOTE — Addendum Note (Signed)
Addended by: Lorretta Harp on: 05/29/2022 10:45 AM   Modules accepted: Orders

## 2022-06-01 ENCOUNTER — Encounter: Payer: Self-pay | Admitting: Hematology and Oncology

## 2022-06-01 ENCOUNTER — Other Ambulatory Visit: Payer: Self-pay | Admitting: Hematology and Oncology

## 2022-06-01 DIAGNOSIS — Z170 Estrogen receptor positive status [ER+]: Secondary | ICD-10-CM

## 2022-06-01 DIAGNOSIS — R9389 Abnormal findings on diagnostic imaging of other specified body structures: Secondary | ICD-10-CM

## 2022-06-03 ENCOUNTER — Other Ambulatory Visit: Payer: Self-pay | Admitting: Hematology and Oncology

## 2022-06-03 ENCOUNTER — Telehealth: Payer: Self-pay

## 2022-06-03 DIAGNOSIS — R9389 Abnormal findings on diagnostic imaging of other specified body structures: Secondary | ICD-10-CM

## 2022-06-03 DIAGNOSIS — Z170 Estrogen receptor positive status [ER+]: Secondary | ICD-10-CM

## 2022-06-03 NOTE — Telephone Encounter (Signed)
Spoke with patient regarding the various tests that have been ordered. Patient needed clarification over order of the testing to be done. She understands that once she has bx she will be scheduled for her next appointment.

## 2022-06-11 ENCOUNTER — Encounter: Payer: Self-pay | Admitting: *Deleted

## 2022-06-18 ENCOUNTER — Ambulatory Visit (HOSPITAL_COMMUNITY)
Admission: RE | Admit: 2022-06-18 | Discharge: 2022-06-18 | Disposition: A | Discharge status: Home / Self Care | Payer: Medicare Other | Source: Ambulatory Visit | Attending: Hematology and Oncology | Admitting: Hematology and Oncology

## 2022-06-18 DIAGNOSIS — Z170 Estrogen receptor positive status [ER+]: Secondary | ICD-10-CM | POA: Insufficient documentation

## 2022-06-18 DIAGNOSIS — C50411 Malignant neoplasm of upper-outer quadrant of right female breast: Secondary | ICD-10-CM | POA: Diagnosis not present

## 2022-06-18 DIAGNOSIS — C50919 Malignant neoplasm of unspecified site of unspecified female breast: Secondary | ICD-10-CM | POA: Diagnosis not present

## 2022-06-18 LAB — GLUCOSE, CAPILLARY: Glucose-Capillary: 95 mg/dL (ref 70–99)

## 2022-06-18 MED ORDER — FLUDEOXYGLUCOSE F - 18 (FDG) INJECTION
10.2000 | Freq: Once | INTRAVENOUS | Status: AC | PRN
  Administered 2022-06-18: 10.1 via INTRAVENOUS

## 2022-06-19 ENCOUNTER — Other Ambulatory Visit: Payer: Self-pay | Admitting: Hematology and Oncology

## 2022-06-19 ENCOUNTER — Ambulatory Visit
Admission: RE | Admit: 2022-06-19 | Discharge: 2022-06-19 | Disposition: A | Discharge status: Home / Self Care | Payer: Medicare Other | Source: Ambulatory Visit | Attending: Hematology and Oncology | Admitting: Hematology and Oncology

## 2022-06-19 DIAGNOSIS — Z170 Estrogen receptor positive status [ER+]: Secondary | ICD-10-CM

## 2022-06-19 DIAGNOSIS — R9389 Abnormal findings on diagnostic imaging of other specified body structures: Secondary | ICD-10-CM

## 2022-06-19 DIAGNOSIS — N6489 Other specified disorders of breast: Secondary | ICD-10-CM | POA: Diagnosis not present

## 2022-06-19 DIAGNOSIS — Z853 Personal history of malignant neoplasm of breast: Secondary | ICD-10-CM | POA: Diagnosis not present

## 2022-06-22 ENCOUNTER — Other Ambulatory Visit: Payer: Self-pay | Admitting: Hematology and Oncology

## 2022-06-22 ENCOUNTER — Telehealth: Payer: Self-pay | Admitting: *Deleted

## 2022-06-22 DIAGNOSIS — R9389 Abnormal findings on diagnostic imaging of other specified body structures: Secondary | ICD-10-CM

## 2022-06-22 NOTE — Telephone Encounter (Signed)
Per Dr.Iruku, labs were routed to [facility] physicians. Called and made several attempts to speak with provider's nurse. Could not get through prompt.

## 2022-06-23 ENCOUNTER — Telehealth: Payer: Self-pay | Admitting: Hematology and Oncology

## 2022-06-23 NOTE — Telephone Encounter (Signed)
Contacted patient to scheduled appointments. Patient is aware of appointments that are scheduled.   

## 2022-06-24 ENCOUNTER — Other Ambulatory Visit: Payer: Self-pay | Admitting: Hematology and Oncology

## 2022-06-26 ENCOUNTER — Encounter: Payer: Self-pay | Admitting: Hematology and Oncology

## 2022-06-29 ENCOUNTER — Ambulatory Visit
Admission: RE | Admit: 2022-06-29 | Discharge: 2022-06-29 | Disposition: A | Discharge status: Home / Self Care | Payer: Medicare Other | Source: Ambulatory Visit | Attending: Hematology and Oncology | Admitting: Hematology and Oncology

## 2022-06-29 DIAGNOSIS — R9389 Abnormal findings on diagnostic imaging of other specified body structures: Secondary | ICD-10-CM

## 2022-06-29 DIAGNOSIS — C50411 Malignant neoplasm of upper-outer quadrant of right female breast: Secondary | ICD-10-CM

## 2022-06-30 ENCOUNTER — Encounter: Payer: Self-pay | Admitting: Internal Medicine

## 2022-06-30 ENCOUNTER — Inpatient Hospital Stay: Payer: Medicare Other | Admitting: Hematology and Oncology

## 2022-06-30 ENCOUNTER — Encounter: Payer: Self-pay | Admitting: *Deleted

## 2022-06-30 DIAGNOSIS — E063 Autoimmune thyroiditis: Secondary | ICD-10-CM | POA: Diagnosis not present

## 2022-06-30 DIAGNOSIS — D8481 Immunodeficiency due to conditions classified elsewhere: Secondary | ICD-10-CM | POA: Diagnosis not present

## 2022-06-30 DIAGNOSIS — Z8379 Family history of other diseases of the digestive system: Secondary | ICD-10-CM | POA: Diagnosis not present

## 2022-06-30 DIAGNOSIS — Z8349 Family history of other endocrine, nutritional and metabolic diseases: Secondary | ICD-10-CM | POA: Diagnosis not present

## 2022-06-30 DIAGNOSIS — Z853 Personal history of malignant neoplasm of breast: Secondary | ICD-10-CM | POA: Diagnosis not present

## 2022-06-30 NOTE — Telephone Encounter (Signed)
On PET scan in June 18, 2022  - there is a 0.9 cm nodule within the posterior basal right upper lobe which is too small to reliably characterize by PET-CT. This exhibits mild tracer uptake with SUV max of 1.78. This nodule is not visualized on the CT from 05/25/2022.  The fact that new lung nodule appeared so fast in a matter of less than 4 weeks suggests that this is not cancer.  Plan - I think we should wait for the lymph node biopsy results -She will probably benefit from a CT scan in the next 3 to 6 months depending on the lymph node biopsy results.

## 2022-07-03 ENCOUNTER — Inpatient Hospital Stay: Payer: Medicare Other | Admitting: Hematology and Oncology

## 2022-07-03 ENCOUNTER — Ambulatory Visit (HOSPITAL_BASED_OUTPATIENT_CLINIC_OR_DEPARTMENT_OTHER)
Admission: RE | Admit: 2022-07-03 | Discharge: 2022-07-03 | Disposition: A | Discharge status: Home / Self Care | Payer: Medicare Other | Source: Ambulatory Visit | Attending: Hematology and Oncology | Admitting: Hematology and Oncology

## 2022-07-03 ENCOUNTER — Other Ambulatory Visit: Payer: Self-pay | Admitting: Hematology and Oncology

## 2022-07-03 DIAGNOSIS — R9389 Abnormal findings on diagnostic imaging of other specified body structures: Secondary | ICD-10-CM | POA: Insufficient documentation

## 2022-07-03 DIAGNOSIS — N2889 Other specified disorders of kidney and ureter: Secondary | ICD-10-CM

## 2022-07-03 MED ORDER — IOHEXOL 350 MG/ML SOLN
100.0000 mL | Freq: Once | INTRAVENOUS | Status: AC | PRN
  Administered 2022-07-03: 100 mL via INTRAVENOUS

## 2022-07-03 MED ORDER — IOHEXOL 300 MG/ML  SOLN: 100.0000 mL | Freq: Once | INTRAMUSCULAR | Status: DC | PRN

## 2022-07-03 NOTE — Progress Notes (Signed)
I called and relayed the results of the CT contrast and she is agreeable for urology consult. She wanted to see a Dr Diona Fanti if possible.

## 2022-07-03 NOTE — Progress Notes (Signed)
Referral placed to urology.  Elizabeth Ramirez

## 2022-07-08 ENCOUNTER — Telehealth: Payer: Self-pay

## 2022-07-08 NOTE — Telephone Encounter (Signed)
Patient called advising she was referred to you for a renal mass. Your next new patient appt is 2/20. Patient advised she wanted to be seen at this office to stay within [facility]. I wanted to see what your preference was in regard to scheduling her with this diagnosis.

## 2022-07-09 ENCOUNTER — Ambulatory Visit
Admission: RE | Admit: 2022-07-09 | Discharge: 2022-07-09 | Disposition: A | Discharge status: Home / Self Care | Payer: Medicare Other | Source: Ambulatory Visit | Attending: Hematology and Oncology | Admitting: Hematology and Oncology

## 2022-07-09 ENCOUNTER — Encounter: Payer: Self-pay | Admitting: Hematology and Oncology

## 2022-07-09 ENCOUNTER — Other Ambulatory Visit (HOSPITAL_COMMUNITY)
Admission: RE | Admit: 2022-07-09 | Discharge: 2022-07-09 | Disposition: A | Discharge status: Home / Self Care | Payer: Medicare Other | Attending: Diagnostic Radiology | Admitting: Diagnostic Radiology

## 2022-07-09 ENCOUNTER — Other Ambulatory Visit: Payer: Self-pay | Admitting: Hematology and Oncology

## 2022-07-09 DIAGNOSIS — R9389 Abnormal findings on diagnostic imaging of other specified body structures: Secondary | ICD-10-CM

## 2022-07-09 DIAGNOSIS — Z853 Personal history of malignant neoplasm of breast: Secondary | ICD-10-CM | POA: Insufficient documentation

## 2022-07-09 DIAGNOSIS — R928 Other abnormal and inconclusive findings on diagnostic imaging of breast: Secondary | ICD-10-CM | POA: Diagnosis not present

## 2022-07-09 DIAGNOSIS — R2232 Localized swelling, mass and lump, left upper limb: Secondary | ICD-10-CM | POA: Diagnosis not present

## 2022-07-09 DIAGNOSIS — D4989 Neoplasm of unspecified behavior of other specified sites: Secondary | ICD-10-CM | POA: Diagnosis not present

## 2022-07-09 DIAGNOSIS — Z170 Estrogen receptor positive status [ER+]: Secondary | ICD-10-CM

## 2022-07-13 NOTE — Progress Notes (Signed)
History of Present Illness: Elizabeth Ramirez is a 77 y.o. year old female w/ possible recurrent metastatic breast cancer here for E/M of a Lt renal mass seen on recent surveillance CT A/P.  She had initial management of breast carcinoma in 2005.  Following that she was on Arimidex for 5 years.  She had recurrent right-sided breast cancer treated again in 2016.  She has had regional lymphadenopathy evaluated in the past that was felt to be due to sarcoid.  She has had recent biopsy of left axillary adenopathy.  This was done approximately 5 days ago, results are still pending.  She did have a PET scan positive for several extra mammary sites, including activity in a relatively small left lower pole exophytic renal mass.  CT did confirm a renal mass 13 mm in size.  PET scan from 1.4.2024: IMPRESSION: 1. The enlarging lymph node within the left axilla is tracer avid. In a patient with known breast cancer as well as sarcoid findings may reflect metastatic adenopathy and or granulomatous inflammation or infection. 2. Similar appearance of enlarged and tracer avid retroperitoneal, pelvic and bilateral inguinal lymph nodes. Findings are nonspecific reflect metastatic adenopathy or granulomatous inflammation in a patient with known breast cancer as well as sarcoid. 3. There is mild increased tracer uptake associated with the spleen above background liver activity. No focal areas of increased uptake above background splenic activity noted. Findings are nonspecific but may reflect post treatment change. 4. There is a small tracer avid nodule between the crus of the left hemidiaphragm in the left lateral aspect of the T12 vertebral body. This is favored to represent a small tracer avid which may reflect underlying granulomatous inflammation or metastatic disease. 5. There is a small tracer avid nodule within the left parotid gland which is nonspecific. This may represent a benign or  malignant primary parotid neoplasm. Granulomatous inflammation not excluded. 6. There is a 0.9 cm nodule within the posterior basal right upper lobe which is too small to reliably characterize by PET-CT. This exhibits mild tracer uptake with SUV max of 1.78. This nodule is not visualized on the CT from 05/25/2022. 7. There is a 1 cm exophytic lesion arising off the posterior aspect of the lower pole of left kidney which is intermediate in attenuation and exhibits mild increased tracer uptake. This is a new finding compared with the previous PET-CT. Recommend more definitive characterization with renal mass protocol CT or MRI without with contrast material.   CT reading from 1.19.2024: 1. A partially exophytic lesion of the posterior inferior pole of the left kidney measures 1.3 x 1.2 cm, and is of intrinsic fluid attenuation on precontrast imaging, however does appear to demonstrate enhancement to soft tissue attenuation on postcontrast images. Examination in the vicinity of this lesion is unfortunately somewhat limited by streak artifact related to patient body habitus in the adjacent lumbar spine however the presence of FDG avidity associated with this lesion on prior examination and measurable change in attenuation following contrast administration is highly suspicious for a small, poorly enhancing renal cell carcinoma. 2. Enlarged, previously FDG avid retroperitoneal and partially imaged iliac lymph nodes, unchanged compared to recent prior PET-CT. 3. Unchanged small soft tissue nodule or lymph node adjacent to the head of the left twelfth rib and T12 vertebral body, previously FDG avid.   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  Additionally, no variants of uncertain significance (VUSs) were found.  The Breast/Ovarian gene panel offered by [facility] [name], MD) includes sequencing and deletion/duplication analysis for the following 19 genes:  ATM, BARD1, BRCA1, BRCA2, BRIP1, CDH1, CHEK2, FANCC, MLH1, MSH2, MSH6, NBN, PALB2, PMS2, PTEN, RAD51C, RAD51D, TP53, and XRCC2.  This panel also includes deletion/duplication analysis (without sequencing) for one gene, EPCAM.  Date of report is January 14, 2015.     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   Sarcoid 05/02/2021   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   Right proximal bridge of nose ([id number])   Seasonal allergies    Thyroid disease    Vaccine counseling 05/02/2021   Vitamin D deficiency     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   BREAST LUMPECTOMY Right 2016   BREAST LUMPECTOMY WITH NEEDLE LOCALIZATION Right 02/11/2015   Procedure: WIRE BRACKETED RIGHT  BREAST LUMPECTOMY ;  Surgeon: Excell Seltzer, MD;  Location: [facility];  Service: General;  Laterality: Right;   CATARACT EXTRACTION, BILATERAL  10-05-2017 left ; 11-09-2017 right   Dr Kandy Garrison    colonoscopy with biopsy  12/28/2003   neg   EVACUATION BREAST HEMATOMA Left 03/11/2021   Procedure: LEFT AXILLARY SEROMA DRAINAGE AND DRAIN PLACEMENT;  Surgeon: Rolm Bookbinder, MD;  Location: [facility];  Service: General;  Laterality: Left;   LYMPH NODE DISSECTION Right 02/23/2020   Procedure: right inguinal lymph node excision;  Surgeon: Alphonsa Overall, MD;  Location: [facility];  Service: General;  Laterality: Right;   RADIOACTIVE SEED GUIDED AXILLARY SENTINEL LYMPH NODE Left 02/18/2021   Procedure: RADIOACTIVE SEED GUIDED LEFT AXILLARY LYMPH NODE EXCISION;  Surgeon: Rolm Bookbinder, MD;  Location: [facility];  Service: General;  Laterality: Left;   REVERSE SHOULDER ARTHROPLASTY Left 07/14/2018   Procedure: REVERSE SHOULDER ARTHROPLASTY;  Surgeon: Justice Britain, MD;  Location: [facility];  Service: Orthopedics;  Laterality: Left;  Interscalene Block   TONSILLECTOMY     1954   TOTAL KNEE ARTHROPLASTY Right 05/27/2015   Procedure: RIGHT TOTAL KNEE ARTHROPLASTY;  Surgeon: Gaynelle Arabian, MD;  Location: [facility];  Service: Orthopedics;  Laterality: Right;   TOTAL KNEE ARTHROPLASTY  Left 05/20/2020   Procedure: TOTAL KNEE ARTHROPLASTY;  Surgeon: Gaynelle Arabian, MD;  Location: [facility];  Service: Orthopedics;  Laterality: Left;  4mn   WISDOM TOOTH EXTRACTION Bilateral 1965    Home Medications:  (Not in a hospital admission)   Allergies:  Allergies  Allergen Reactions   Levofloxacin Other (See Comments)    Affected tendons, severe pain and swelling   Tetracyclines & Related Swelling    tongue swelling and peeling   Other Diarrhea, Rash and Other (See Comments)    Crabs - diarrhea. Bay leaves - rash in her mouth   Anesthesia [id number]  [Propofol]     Other reaction(s): respiratory failure requiring rescue meds   Ciprofloxacin     Tendon problems    Flonase [Fluticasone]     Nasal spray cause nose bleeds   Ibuprofen     Gets mouth ulcers if she takes for more than 3 days; Aleve okay    Nitrofurantoin Monohyd Macro Hives   Praluent [Alirocumab]     Joint pain   Pravastatin     Muscle aches, rash, constipation on '20mg'$  every other day dosing   Rosuvastatin     Muscle and joint pain on '10mg'$  daily dosing   Scopolamine     "Headache and redness of eyes"    Family History  Problem Relation Age of Onset   Dementia Mother    Other Mother 869      "spot on lung"; secondhand smoke exposure   Pulmonary fibrosis Father    Diabetes Brother    Thyroid disease Brother    Leukemia Brother 762      chronic lymphocytic    Stomach cancer Maternal Grandmother        dx. 352s  Heart attack Maternal Grandfather    Diabetes Paternal Grandfather    Stroke Paternal Grandfather    Colitis Paternal Grandmother    Parkinson's disease Maternal Aunt    Diabetes Paternal Aunt    Other Paternal Aunt        bowel obstruction   Heart Problems Paternal Uncle    Ovarian cancer Maternal Aunt        dx. 549s paternal half sister of her mother   Multiple myeloma Maternal Aunt    Multiple myeloma Maternal Aunt 75   Kidney failure Paternal Aunt    Pulmonary fibrosis Paternal Aunt    Stroke Paternal Aunt    Heart Problems Paternal Aunt    Emphysema Paternal Uncle    Heart Problems Paternal Uncle    Lung cancer Paternal Uncle        smoker   Colon cancer Cousin    Breast cancer Cousin        dx. 565s  Pulmonary fibrosis Cousin     Social History:  reports that she quit smoking about 39 years ago. Her smoking use included cigarettes. She has a 9.00 pack-year smoking history. She has never used smokeless tobacco. She reports current alcohol use of about 2.0 standard drinks of alcohol per week. She reports that she does not use  drugs.  ROS: A complete review of systems was performed.  All systems are negative except for pertinent findings as noted.  Physical Exam:  Vital signs in last 24 hours: '@VSRANGES'$ @ General:  Alert and oriented, No acute distress HEENT: Normocephalic, atraumatic Neck: No JVD or lymphadenopathy Cardiovascular: Regular rate  Lungs: Normal inspiratory/expiratory excursion Neurologic: Grossly intact  I have reviewed prior pt notes  I have reviewed  notes from referring/previous physicians-Dr. Chase Caller, Dr Chryl Heck  I have reviewed urinalysis results  I have independently reviewed prior imaging-CT and PET scan images were reviewed with the patient and her partner  Recent labs reviewed.  She does have CKD  Impression/Assessment:  Small incidental left lower pole renal mass with activity on PET scan.  This may well be renal cell carcinoma.  There is also possibility of metastatic disease.  Currently asymptomatic.  Plan:  I discussed with Elizabeth Ramirez and her partner, Elizabeth Ramirez the fact that most small renal masses like this in people her age are followed with active surveillance.  This would entail routine imaging on a every 4 to 81-monthbasis.  If significant growth, especially if it enlarges to over 30 mm in size, 1 could then proceed with laparoscopic partial nephrectomy or cryoablation/RFA.  Seeing that she has had some issues with anesthesia within the past, I would strongly recommend surveillance, especially with the size of this being so small.  SKatharine Lookand on both agree with this strategy.  I will order a MRI to be done in approximately 4 months.  We will call her with results of that as well as further management.  They both feel at ease with this plan  SJorja Loa1/29/2024, 7:30 PM  SLillette Boxer Mansfield Dann MD

## 2022-07-14 ENCOUNTER — Ambulatory Visit (INDEPENDENT_AMBULATORY_CARE_PROVIDER_SITE_OTHER): Payer: Medicare Other | Admitting: Urology

## 2022-07-14 ENCOUNTER — Inpatient Hospital Stay: Payer: Medicare Other | Admitting: Hematology and Oncology

## 2022-07-14 VITALS — BP 125/85 | HR 86

## 2022-07-14 DIAGNOSIS — N2889 Other specified disorders of kidney and ureter: Secondary | ICD-10-CM | POA: Diagnosis not present

## 2022-07-14 LAB — SURGICAL PATHOLOGY

## 2022-07-15 LAB — URINALYSIS, ROUTINE W REFLEX MICROSCOPIC
Bilirubin, UA: NEGATIVE
Glucose, UA: NEGATIVE
Ketones, UA: NEGATIVE
Leukocytes,UA: NEGATIVE
Nitrite, UA: NEGATIVE
Protein,UA: NEGATIVE
RBC, UA: NEGATIVE
Specific Gravity, UA: 1.01 (ref 1.005–1.030)
Urobilinogen, Ur: 0.2 mg/dL (ref 0.2–1.0)
pH, UA: 7 (ref 5.0–7.5)

## 2022-07-16 ENCOUNTER — Inpatient Hospital Stay: Payer: Medicare Other

## 2022-07-16 ENCOUNTER — Inpatient Hospital Stay: Payer: Medicare Other | Attending: Hematology and Oncology | Admitting: Hematology and Oncology

## 2022-07-16 VITALS — BP 139/71 | HR 77 | Temp 98.2°F | Resp 16 | Ht 64.0 in | Wt 206.5 lb

## 2022-07-16 DIAGNOSIS — C8580 Other specified types of non-Hodgkin lymphoma, unspecified site: Secondary | ICD-10-CM | POA: Diagnosis not present

## 2022-07-16 DIAGNOSIS — D479 Neoplasm of uncertain behavior of lymphoid, hematopoietic and related tissue, unspecified: Secondary | ICD-10-CM

## 2022-07-16 DIAGNOSIS — R918 Other nonspecific abnormal finding of lung field: Secondary | ICD-10-CM

## 2022-07-16 DIAGNOSIS — Z8572 Personal history of non-Hodgkin lymphomas: Secondary | ICD-10-CM | POA: Diagnosis not present

## 2022-07-16 DIAGNOSIS — Z79899 Other long term (current) drug therapy: Secondary | ICD-10-CM | POA: Insufficient documentation

## 2022-07-16 DIAGNOSIS — D72819 Decreased white blood cell count, unspecified: Secondary | ICD-10-CM | POA: Insufficient documentation

## 2022-07-16 DIAGNOSIS — E039 Hypothyroidism, unspecified: Secondary | ICD-10-CM | POA: Diagnosis not present

## 2022-07-16 DIAGNOSIS — R911 Solitary pulmonary nodule: Secondary | ICD-10-CM | POA: Insufficient documentation

## 2022-07-16 DIAGNOSIS — Z86000 Personal history of in-situ neoplasm of breast: Secondary | ICD-10-CM | POA: Diagnosis not present

## 2022-07-16 DIAGNOSIS — Z923 Personal history of irradiation: Secondary | ICD-10-CM | POA: Insufficient documentation

## 2022-07-16 DIAGNOSIS — N2889 Other specified disorders of kidney and ureter: Secondary | ICD-10-CM

## 2022-07-16 DIAGNOSIS — D696 Thrombocytopenia, unspecified: Secondary | ICD-10-CM | POA: Insufficient documentation

## 2022-07-16 DIAGNOSIS — C50411 Malignant neoplasm of upper-outer quadrant of right female breast: Secondary | ICD-10-CM

## 2022-07-16 DIAGNOSIS — Z170 Estrogen receptor positive status [ER+]: Secondary | ICD-10-CM

## 2022-07-16 DIAGNOSIS — R7402 Elevation of levels of lactic acid dehydrogenase (LDH): Secondary | ICD-10-CM

## 2022-07-16 LAB — CMP (CANCER CENTER ONLY)
ALT: 16 U/L (ref 0–44)
AST: 16 U/L (ref 15–41)
Albumin: 4.2 g/dL (ref 3.5–5.0)
Alkaline Phosphatase: 67 U/L (ref 38–126)
Anion gap: 7 (ref 5–15)
BUN: 17 mg/dL (ref 8–23)
CO2: 28 mmol/L (ref 22–32)
Calcium: 9.8 mg/dL (ref 8.9–10.3)
Chloride: 105 mmol/L (ref 98–111)
Creatinine: 1.06 mg/dL — ABNORMAL HIGH (ref 0.44–1.00)
GFR, Estimated: 54 mL/min — ABNORMAL LOW (ref >60)
Glucose, Bld: 79 mg/dL (ref 70–99)
Potassium: 4.5 mmol/L (ref 3.5–5.1)
Sodium: 140 mmol/L (ref 135–145)
Total Bilirubin: 1.2 mg/dL (ref 0.3–1.2)
Total Protein: 6.7 g/dL (ref 6.5–8.1)

## 2022-07-16 LAB — CBC WITH DIFFERENTIAL/PLATELET
Abs Immature Granulocytes: 0.02 10*3/uL (ref 0.00–0.07)
Basophils Absolute: 0 10*3/uL (ref 0.0–0.1)
Basophils Relative: 0 %
Eosinophils Absolute: 0 10*3/uL (ref 0.0–0.5)
Eosinophils Relative: 1 %
HCT: 39.4 % (ref 36.0–46.0)
Hemoglobin: 14 g/dL (ref 12.0–15.0)
Immature Granulocytes: 1 %
Lymphocytes Relative: 36 %
Lymphs Abs: 1.2 10*3/uL (ref 0.7–4.0)
MCH: 31.5 pg (ref 26.0–34.0)
MCHC: 35.5 g/dL (ref 30.0–36.0)
MCV: 88.7 fL (ref 80.0–100.0)
Monocytes Absolute: 0.4 10*3/uL (ref 0.1–1.0)
Monocytes Relative: 11 %
Neutro Abs: 1.7 10*3/uL (ref 1.7–7.7)
Neutrophils Relative %: 51 %
Platelets: 111 10*3/uL — ABNORMAL LOW (ref 150–400)
RBC: 4.44 MIL/uL (ref 3.87–5.11)
RDW: 13.4 % (ref 11.5–15.5)
WBC: 3.4 10*3/uL — ABNORMAL LOW (ref 4.0–10.5)
nRBC: 0 % (ref 0.0–0.2)

## 2022-07-16 LAB — LACTATE DEHYDROGENASE: LDH: 94 U/L — ABNORMAL LOW (ref 98–192)

## 2022-07-16 NOTE — Progress Notes (Signed)
[place]  Telephone:([phone]) [phone] Fax:(336) [phone]    ID: DNIYAH GRANT DOB: 1947-78-11  MR#: [id number]  VFI#:[id number]  Patient Care Team: Cari Caraway, MD as PCP - General (Family Medicine) Nahser, Wonda Cheng, MD as PCP - Cardiology (Cardiology) Excell Seltzer, MD (Inactive) as Consulting Physician (General Surgery) Bobbye Charleston, MD as Consulting Physician (Obstetrics and Gynecology) Justice Britain, MD as Consulting Physician (Orthopedic Surgery) Rolm Bookbinder, MD as Consulting Physician (General Surgery) Benay Pike, MD as Consulting Physician (Hematology and Oncology) OTHER MD:    CHIEF COMPLAINT:  marginal zone lymphoma   CURRENT TREATMENT: Continued observation  INTERVAL HISTORY:  She is here for a follow up.  Since last visit, she had PET imaging which showed lymphadenopathy and biopsy showed marginal zone lymphoma. She saw a urologist and recommendation was to consider surveillance. She denies any new health complaints today. She says the anxiety associated with all this work up is more bothersome.  No fevers, drenching night sweats, loss of appetite or loss of weight unintentionally. She did lose about 30 lbs of weight but this was intentionally.  She denies any other recurrent infections today.  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 This was estrogen and progesterone receptor positive, HER-2 not amplified. Margins were ample. She participated in the MA-27 study and received anastrozole between June 2005 and June 2010, at which time she was released from follow-up.  More recently, [place] had screening bilateral mammography  showing a suspicious area in the right breast and on 12/14/2014 underwent right diagnostic mammography at the breast Center. The breast density was category A. There was a group of pleomorphic calcifications in the upper central right breast spanning up to 2.8 cm. Biopsy of this area was obtained 12/21/2014 ([id number]) and showed ductal carcinoma in situ, grade 2 or 3, estrogen receptor 95% positive, and progesterone receptor 60% positive, both with strong staining intensity.  The patient's case was presented at the multidisciplinary breast cancer conference 01/02/2015. At that time a preliminary plan was proposed, namely genetics counseling and if no mutation noted breast MRI to be followed by lumpectomy, radiation, and anti-estrogens.  Her subsequent history is as detailed below.   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  Additionally, no variants of uncertain significance (VUSs) were found.  The Breast/Ovarian gene panel offered by [facility] [name], MD) includes sequencing and deletion/duplication analysis for the following 19 genes:  ATM, BARD1, BRCA1, BRCA2, BRIP1, CDH1,  CHEK2, FANCC, MLH1, MSH2, MSH6, NBN, PALB2, PMS2, PTEN, RAD51C, RAD51D, TP53, and XRCC2.  This panel also includes deletion/duplication analysis (without sequencing) for one gene, EPCAM.   Date of report is January 14, 2015.     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   Sarcoid 05/02/2021   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   Right proximal bridge of nose ([id number])   Seasonal allergies    Thyroid disease    Vaccine counseling 05/02/2021   Vitamin D deficiency     PAST SURGICAL HISTORY: 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   BREAST LUMPECTOMY Right 2016   BREAST LUMPECTOMY WITH NEEDLE LOCALIZATION Right 02/11/2015   Procedure: WIRE BRACKETED RIGHT BREAST LUMPECTOMY ;  Surgeon: Excell Seltzer, MD;  Location: [place];  Service: General;  Laterality: Right;   CATARACT EXTRACTION, BILATERAL  10-05-2017 left ; 11-09-2017 right   Dr Kandy Garrison    colonoscopy with biopsy  12/28/2003   neg   EVACUATION BREAST HEMATOMA Left 03/11/2021   Procedure: LEFT AXILLARY SEROMA DRAINAGE AND DRAIN PLACEMENT;  Surgeon: Rolm Bookbinder, MD;  Location: [place];  Service: General;  Laterality: Left;   LYMPH NODE DISSECTION Right 02/23/2020   Procedure: right inguinal lymph node excision;  Surgeon: Alphonsa Overall, MD;  Location: [place];  Service: General;  Laterality: Right;   RADIOACTIVE SEED GUIDED AXILLARY SENTINEL LYMPH NODE Left 02/18/2021   Procedure: RADIOACTIVE SEED GUIDED LEFT AXILLARY  LYMPH NODE EXCISION;  Surgeon: Rolm Bookbinder, MD;  Location: [place];  Service: General;  Laterality: Left;   REVERSE SHOULDER ARTHROPLASTY Left 07/14/2018   Procedure: REVERSE SHOULDER ARTHROPLASTY;  Surgeon: Justice Britain, MD;  Location: [facility];  Service: Orthopedics;  Laterality: Left;  Interscalene Block   TONSILLECTOMY     1954   TOTAL KNEE ARTHROPLASTY Right 05/27/2015   Procedure: RIGHT TOTAL KNEE ARTHROPLASTY;  Surgeon: Gaynelle Arabian, MD;  Location: [facility];  Service: Orthopedics;  Laterality: Right;   TOTAL KNEE ARTHROPLASTY Left 05/20/2020   Procedure: TOTAL KNEE ARTHROPLASTY;  Surgeon: Gaynelle Arabian, MD;  Location: [facility];  Service: Orthopedics;  Laterality: Left;  23mn   WISDOM TOOTH EXTRACTION Bilateral 1965    FAMILY HISTORY Family History  Problem Relation Age of Onset   Dementia Mother    Other Mother 823      "spot on lung"; secondhand smoke exposure   Pulmonary fibrosis Father    Diabetes Brother    Thyroid disease Brother    Leukemia Brother 786      chronic lymphocytic    Stomach cancer Maternal Grandmother        dx. 320s  Heart attack Maternal Grandfather    Diabetes Paternal Grandfather    Stroke Paternal Grandfather    Colitis Paternal  Grandmother    Parkinson's disease Maternal Aunt    Diabetes Paternal Aunt    Other Paternal Aunt        bowel obstruction   Heart Problems Paternal Uncle    Ovarian cancer Maternal Aunt        dx. 80s; paternal half sister of her mother   Multiple myeloma Maternal Aunt    Multiple myeloma Maternal Aunt 75   Kidney failure Paternal Aunt    Pulmonary fibrosis Paternal Aunt    Stroke Paternal Aunt    Heart Problems Paternal Aunt    Emphysema Paternal Uncle    Heart Problems Paternal Uncle    Lung cancer Paternal Uncle        smoker   Colon cancer Cousin    Breast cancer Cousin        dx. 29s   Pulmonary fibrosis Remi Deter father died at the age of 47 from pulmonary fibrosis. Her  mother died at age 49 with severe dementia. The patient had one brother no sisters.  Her brother has Parkinson's disease and chronic lymphoid leukemia.  Her maternal grandmother died from stomach cancer the age of 16. The patient's mother had a half sister with ovarian cancer.   GYNECOLOGIC HISTORY:  No LMP recorded. Patient is postmenopausal.  menarche age 71, she isGX P0. Menopause 2003. Did not take hormone replacement   SOCIAL HISTORY:  Works as a Cabin crew, Publishing copy in renovations. She retired from [employer] as of 2018 and is now doing some Electronics engineer. At home it's her and Henriette Combs , who also is in real estate, with her own investment business.Katharine Look is a [place], though not currently practicing    ADVANCED DIRECTIVES:  [name] is Chief Technology Officer of attorney   HEALTH MAINTENANCE: Social History   Tobacco Use   Smoking status: Former    Packs/day: 1.00    Years: 9.00    Total pack years: 9.00    Types: Cigarettes    Quit date: 06/16/1983    Years since quitting: 39.1   Smokeless tobacco: Never  Vaping Use   Vaping Use: Never used  Substance Use Topics   Alcohol use: Yes    Alcohol/week: 2.0 standard drinks of alcohol    Types: 2 Glasses of wine per week    Comment: Twice a week   Drug use: No     Colonoscopy:  20/15, [place] she knee  PAP: up-to-date/[facility]  Bone density: October 2016; normal  Lipid panel:  Allergies  Allergen Reactions   Levofloxacin Other (See Comments)    Affected tendons, severe pain and swelling   Tetracyclines & Related Swelling    tongue swelling and peeling   Other Diarrhea, Rash and Other (See Comments)    Crabs - diarrhea. Bay leaves - rash in her mouth   Anesthesia S-I-40 [Propofol]     Other reaction(s): respiratory failure requiring rescue meds   Ciprofloxacin     Tendon problems    Flonase [Fluticasone]     Nasal spray cause nose bleeds   Ibuprofen     Gets mouth ulcers if she takes for more than 3 days; Aleve  okay    Nitrofurantoin Monohyd Macro Hives   Praluent [Alirocumab]     Joint pain   Pravastatin     Muscle aches, rash, constipation on '20mg'$  every other day dosing   Rosuvastatin     Muscle and joint pain on '10mg'$  daily dosing   Scopolamine     "  Headache and redness of eyes"    Current Outpatient Medications  Medication Sig Dispense Refill   Calcium Carbonate-Vitamin D 600-400 MG-UNIT tablet 2 tablets     SYNTHROID 100 MCG tablet Take 100 mcg by mouth every morning.     Current Facility-Administered Medications  Medication Dose Route Frequency Provider Last Rate Last Admin   0.9 %  sodium chloride infusion   Intravenous PRN Eileen Stanford, PA-C        OBJECTIVE: White woman in no acute distress  Vitals:   07/16/22 1533  BP: 139/71  Pulse: 77  Resp: 16  Temp: 98.2 F (36.8 C)  SpO2: 100%        Body mass index is 35.45 kg/m.    ECOG FS:1 - Symptomatic but completely ambulatory  Physical Exam Constitutional:      Appearance: Normal appearance.  Cardiovascular:     Rate and Rhythm: Normal rate and regular rhythm.  Pulmonary:     Effort: Pulmonary effort is normal.     Breath sounds: Normal breath sounds.  Abdominal:     General: Abdomen is flat.     Palpations: Abdomen is soft.  Musculoskeletal:     Cervical back: Normal range of motion and neck supple. No rigidity.  Lymphadenopathy:     Cervical: No cervical adenopathy.     Upper Body:     Left upper body: Axillary adenopathy present.  Skin:    General: Skin is warm and dry.  Neurological:     General: No focal deficit present.     Mental Status: She is alert.      RESULTS: MM CLIP PLACEMENT LEFT  Result Date: 07/09/2022 CLINICAL DATA:  Status post ultrasound-guided core biopsy of LEFT axillary mass. EXAM: 3D DIAGNOSTIC LEFT MAMMOGRAM POST ULTRASOUND BIOPSY COMPARISON:  Previous exam(s). FINDINGS: 3D Mammographic images were obtained following ultrasound pain guided biopsy of and LEFT axillary mass  and placement of a HydroMARK coil shaped clip. The biopsy marking clip is not visible mammogram, due to the depth of the lesion. Prior LEFT shoulder arthroplasty. IMPRESSION: LEFT axillary mass clip is not visible mammographically. Final Assessment: Post Procedure Mammograms for Marker Placement Electronically Signed   By: Nolon Nations M.D.   On: 07/09/2022 13:52  [place] AXILLARY NODE CORE BIOPSY LEFT  Result Date: 07/09/2022 CLINICAL DATA:  Patient presents for ultrasound-guided core biopsy of LEFT axillary mass. Patient has history biopsies LEFT axillary and RIGHT inguinal lymph nodes, both benign. History of RIGHT breast cancer and sarcoidosis. EXAM: [place] AXILLARY NODE CORE BIOPSY LEFT COMPARISON:  Previous exam(s). PROCEDURE: I met with the patient and we discussed the procedure of ultrasound-guided biopsy, including benefits and alternatives. We discussed the high likelihood of a successful procedure. We discussed the risks of the procedure, including infection, bleeding, tissue injury, clip migration, and inadequate sampling. Informed written consent was given. The usual time-out protocol was performed immediately prior to the procedure. Using sterile technique and 1% Lidocaine and lidocaine with epinephrine as local anesthetic, under direct ultrasound visualization, a 14 gauge spring-loaded device was used to perform biopsy of LEFT axillary mass using a LATERAL approach. At the conclusion of the procedure spiral [facility] tissue marker clip was deployed into the biopsy cavity. Follow up 2 view mammogram was performed and dictated separately. IMPRESSION: Ultrasound guided biopsy of LEFT axillary mass. No apparent complications. Electronically Signed   By: Nolon Nations M.D.   On: 07/09/2022 13:50  CT RENAL ABD W/WO  Result Date: 07/03/2022 CLINICAL DATA:  Characterize suspicious left lower pole renal lesion identified by prior PET-CT patient with established history of breast cancer and sarcoid *  Tracking Code: BO * EXAM: CT ABDOMEN WITHOUT AND WITH CONTRAST TECHNIQUE: Multidetector CT imaging of the abdomen was performed following the standard protocol before and following the bolus administration of intravenous contrast. RADIATION DOSE REDUCTION: This exam was performed according to the departmental dose-optimization program which includes automated exposure control, adjustment of the mA and/or kV according to patient size and/or use of iterative reconstruction technique. CONTRAST:  111m OMNIPAQUE IOHEXOL 350 MG/ML SOLN COMPARISON:  PET-CT 06/18/2022 FINDINGS: Examination is generally limited by patient body habitus and photopenia, particularly in the vicinity of the renal lesion of interest. Lower chest: No acute abnormality. Hepatobiliary: No solid liver abnormality is seen. No gallstones, gallbladder wall thickening, or biliary dilatation. Pancreas: Unremarkable. No pancreatic ductal dilatation or surrounding inflammatory changes. Spleen: Normal in size without significant abnormality. Adrenals/Urinary Tract: Adrenal glands are unremarkable. A partially exophytic lesion of the posterior inferior pole of the left kidney measures 1.3 x 1.2 cm, and is of intrinsic fluid attenuation on precontrast imaging, however does appear to demonstrate enhancement to soft tissue attenuation on postcontrast images (precontrast HU = 4, postcontrast HU = 40 (series 11, image 76). Examination in the vicinity of this lesion is unfortunately somewhat limited by streak artifact related to patient body habitus in the adjacent lumbar spine. There are additional simple appearing bilateral renal cortical cysts as well as subcentimeter lesions too small to characterize although almost certainly additional tiny cysts, as well as lobulated bilateral renal cortical scarring, benign findings, for which no specific further follow-up or characterization is required. Stomach/Bowel: Stomach is within normal limits. No evidence of bowel  wall thickening, distention, or inflammatory changes. Vascular/Lymphatic: Aortic atherosclerosis. Enlarged retroperitoneal and bilateral iliac lymph nodes are partially imaged on today's examination and not significantly changed compared to recent prior PET-CT (series 11, image 109, 105). Other: No abdominal wall hernia or abnormality. No ascites. Musculoskeletal: S shaped scoliosis of the lumbar spine. Unchanged small soft tissue nodule or lymph node adjacent to the head of the left twelfth rib and T12 vertebral body (series 11, image 28). IMPRESSION: 1. A partially exophytic lesion of the posterior inferior pole of the left kidney measures 1.3 x 1.2 cm, and is of intrinsic fluid attenuation on precontrast imaging, however does appear to demonstrate enhancement to soft tissue attenuation on postcontrast images. Examination in the vicinity of this lesion is unfortunately somewhat limited by streak artifact related to patient body habitus in the adjacent lumbar spine however the presence of FDG avidity associated with this lesion on prior examination and measurable change in attenuation following contrast administration is highly suspicious for a small, poorly enhancing renal cell carcinoma. 2. Enlarged, previously FDG avid retroperitoneal and partially imaged iliac lymph nodes, unchanged compared to recent prior PET-CT. 3. Unchanged small soft tissue nodule or lymph node adjacent to the head of the left twelfth rib and T12 vertebral body, previously FDG avid. Aortic Atherosclerosis (ICD10-I70.0). Electronically Signed   By: ADelanna AhmadiM.D.   On: 07/03/2022 13:03   MM DIAG BREAST TOMO UNI LEFT  Result Date: 06/19/2022 CLINICAL DATA:  Patient with history of right breast cancer now presenting with new left axillary adenopathy on prior cross-sectional imaging. EXAM: DIGITAL DIAGNOSTIC UNILATERAL LEFT MAMMOGRAM WITH TOMOSYNTHESIS; [place] TECHNIQUE: Left digital diagnostic mammography and breast  tomosynthesis was performed.; Targeted ultrasound examination of the left axilla was performed. COMPARISON:  Previous exam(s).  ACR Breast Density Category b: There are scattered areas of fibroglandular density. FINDINGS: Partially visualized enlarged node within the left axilla. Adjacent postsurgical clips from prior node resection. No additional masses, calcifications or nonsurgical distortion identified within the left breast. Targeted ultrasound is performed, showing a 3.5 x 3.0 x 1.5 cm oval mass within the left axilla, likely an enlarged left axillary lymph node. IMPRESSION: Indeterminate mass within the left axilla, likely an abnormal left axillary lymph node. RECOMMENDATION: Ultrasound-guided core needle biopsy abnormal appearing left axillary lymph node. I have discussed the findings and recommendations with the patient. If applicable, a reminder letter will be sent to the patient regarding the next appointment. BI-RADS CATEGORY  4: Suspicious. Electronically Signed   By: Lovey Newcomer M.D.   On: 06/19/2022 14:45  [place] AXILLA LEFT  Result Date: 06/19/2022 CLINICAL DATA:  Patient with history of right breast cancer now presenting with new left axillary adenopathy on prior cross-sectional imaging. EXAM: DIGITAL DIAGNOSTIC UNILATERAL LEFT MAMMOGRAM WITH TOMOSYNTHESIS; [place] AXILLARY LEFT TECHNIQUE: Left digital diagnostic mammography and breast tomosynthesis was performed.; Targeted ultrasound examination of the left axilla was performed. COMPARISON:  Previous exam(s). ACR Breast Density Category b: There are scattered areas of fibroglandular density. FINDINGS: Partially visualized enlarged node within the left axilla. Adjacent postsurgical clips from prior node resection. No additional masses, calcifications or nonsurgical distortion identified within the left breast. Targeted ultrasound is performed, showing a 3.5 x 3.0 x 1.5 cm oval mass within the left axilla, likely an enlarged left axillary lymph node.  IMPRESSION: Indeterminate mass within the left axilla, likely an abnormal left axillary lymph node. RECOMMENDATION: Ultrasound-guided core needle biopsy abnormal appearing left axillary lymph node. I have discussed the findings and recommendations with the patient. If applicable, a reminder letter will be sent to the patient regarding the next appointment. BI-RADS CATEGORY  4: Suspicious. Electronically Signed   By: Lovey Newcomer M.D.   On: 06/19/2022 14:45  NM PET Image Initial (PI) Skull Base To Thigh  Result Date: 06/19/2022 CLINICAL DATA:  Subsequent treatment strategy for breast cancer. EXAM: NUCLEAR MEDICINE PET SKULL BASE TO THIGH TECHNIQUE: 10.1 mCi F-18 FDG was injected intravenously. Full-ring PET imaging was performed from the skull base to thigh after the radiotracer. CT data was obtained and used for attenuation correction and anatomic localization. Fasting blood glucose: 95 mg/dl COMPARISON:  CT chest 05/25/2022 FINDINGS: Mediastinal blood pool activity: SUV max 2.89 Liver activity: SUV max NA NECK: Small soft tissue nodule within the left parotid gland measures 5 mm and has an SUV max of 6.43, image 26/4. Incidental CT findings: None. CHEST: Left axillary lymph node measures 2.7 x 1.7 cm and has an SUV max of 20.79, image 69/4. Small prevascular lymph node measures 0.6 by 1.0 cm and has an SUV max of 5.12, image 60/4. Tracer avid right hilar lymph node has an SUV max of 7.88, image 71/4. Within the posterior basal right upper lobe there is a lung nodule abutting the major fissure which measures 0.9 cm. Technically too small to reliably characterize this has an SUV max of 1.78, image 31/7. Not seen on the recent CT from 05/25/2022. Incidental CT findings: Aortic atherosclerosis. Coronary artery calcifications. Cardiac enlargement. ABDOMEN/PELVIS: Multiple tracer avid abdominopelvic and bilateral inguinal lymph nodes, including: Aortocaval lymph node measures 1.4 cm with SUV max of 10.06, image 121/4.  On the previous exam this measured 0.7 cm with SUV max of 5.4 Right common iliac node measures 1.2 cm with SUV max of 5.14,  image 140/4. On the previous exam this measured 1.3 cm with SUV max 5.4. Left external iliac lymph node measures 1.7 cm with SUV max of 7.57, image 158/4. Previously 1.6 cm with SUV max of 13.98 Right inguinal lymph node measures 1.2 cm with SUV max of 7.82, image 168/4. Formally 0.8 cm with SUV max of 4.62. Left inguinal lymph node measures 1 cm with SUV max of 8.45, image 167/4. Previously 1.2 cm with SUV max of 12.98. There is a small tracer avid nodule between the crus of the left hemidiaphragm in the left lateral aspect of the T12 vertebral body which measures 8 mm with SUV max of 6.75, image 100/4. There is mild increased uptake throughout the spleen above background liver activity with SUV max of 4.67. No focal areas of increased uptake above background splenic activity noted. Spleen is normal in size measuring 10 cm in length. No abnormal tracer activity within the liver, pancreas, or adrenal glands Incidental CT findings: Aortic atherosclerosis. There is a intermediate attenuating, exophytic lesion arising off the posterior aspect of the lower pole of left kidney measuring 1 cm. There is mildly increased tracer uptake within this lesion within SUV max of 3.95, image 118/4. Not confidently identified on the previous PET-CT. SKELETON: No focal hypermetabolic activity to suggest skeletal metastasis. Incidental CT findings: Status post left shoulder arthroplasty. IMPRESSION: 1. The enlarging lymph node within the left axilla is tracer avid. In a patient with known breast cancer as well as sarcoid findings may reflect metastatic adenopathy and or granulomatous inflammation or infection. 2. Similar appearance of enlarged and tracer avid retroperitoneal, pelvic and bilateral inguinal lymph nodes. Findings are nonspecific reflect metastatic adenopathy or granulomatous inflammation in a patient  with known breast cancer as well as sarcoid. 3. There is mild increased tracer uptake associated with the spleen above background liver activity. No focal areas of increased uptake above background splenic activity noted. Findings are nonspecific but may reflect post treatment change. 4. There is a small tracer avid nodule between the crus of the left hemidiaphragm in the left lateral aspect of the T12 vertebral body. This is favored to represent a small tracer avid which may reflect underlying granulomatous inflammation or metastatic disease. 5. There is a small tracer avid nodule within the left parotid gland which is nonspecific. This may represent a benign or malignant primary parotid neoplasm. Granulomatous inflammation not excluded. 6. There is a 0.9 cm nodule within the posterior basal right upper lobe which is too small to reliably characterize by PET-CT. This exhibits mild tracer uptake with SUV max of 1.78. This nodule is not visualized on the CT from 05/25/2022. 7. There is a 1 cm exophytic lesion arising off the posterior aspect of the lower pole of left kidney which is intermediate in attenuation and exhibits mild increased tracer uptake. This is a new finding compared with the previous PET-CT. Recommend more definitive characterization with renal mass protocol CT or MRI without with contrast material. Electronically Signed   By: Kerby Moors M.D.   On: 06/19/2022 13:19     CMP     Component Value Date/Time   NA 141 02/17/2022 0913   NA 141 12/30/2021 1137   K 4.3 02/17/2022 0913   CL 108 02/17/2022 0913   CO2 27 02/17/2022 0913   GLUCOSE 98 02/17/2022 0913   BUN 22 02/17/2022 0913   BUN 16 12/30/2021 1137   CREATININE 1.22 (H) 02/17/2022 0913   CALCIUM 9.8 02/17/2022 0913   PROT  6.4 (L) 02/17/2022 0913   PROT 6.3 12/30/2021 1137   ALBUMIN 4.3 02/17/2022 0913   ALBUMIN 4.5 12/30/2021 1137   AST 15 02/17/2022 0913   ALT 13 02/17/2022 0913   ALKPHOS 61 02/17/2022 0913   BILITOT  0.9 02/17/2022 0913   GFRNONAA 46 (L) 02/17/2022 0913   GFRAA 51 (L) 03/01/2020 1447   GFRAA 47 (L) 01/26/2020 1220    INo results found for: "SPEP", "UPEP"  Lab Results  Component Value Date   WBC 2.2 (L) 02/17/2022   NEUTROABS 0.9 (L) 02/17/2022   HGB 13.8 02/17/2022   HCT 39.1 02/17/2022   MCV 87.3 02/17/2022   PLT 90 (L) 02/17/2022      Chemistry      Component Value Date/Time   NA 141 02/17/2022 0913   NA 141 12/30/2021 1137   K 4.3 02/17/2022 0913   CL 108 02/17/2022 0913   CO2 27 02/17/2022 0913   BUN 22 02/17/2022 0913   BUN 16 12/30/2021 1137   CREATININE 1.22 (H) 02/17/2022 0913      Component Value Date/Time   CALCIUM 9.8 02/17/2022 0913   ALKPHOS 61 02/17/2022 0913   AST 15 02/17/2022 0913   ALT 13 02/17/2022 0913   BILITOT 0.9 02/17/2022 0913       Lab Results  Component Value Date   LABCA2 22 12/04/2008    No components found for: "LABCA125"  No results for input(s): "INR" in the last 168 hours.  Urinalysis    Component Value Date/Time   COLORURINE YELLOW 05/20/2015 1120   APPEARANCEUR Clear 07/14/2022 1326   LABSPEC 1.009 05/20/2015 1120   PHURINE 6.5 05/20/2015 1120   GLUCOSEU Negative 07/14/2022 1326   HGBUR NEGATIVE 05/20/2015 1120   BILIRUBINUR Negative 07/14/2022 1326   KETONESUR NEGATIVE 05/20/2015 1120   PROTEINUR Negative 07/14/2022 1326   PROTEINUR NEGATIVE 05/20/2015 1120   UROBILINOGEN 0.2 12/19/2014 1427   NITRITE Negative 07/14/2022 1326   NITRITE NEGATIVE 05/20/2015 1120   LEUKOCYTESUR Negative 07/14/2022 1326    STUDIES: MM CLIP PLACEMENT LEFT  Result Date: 07/09/2022 CLINICAL DATA:  Status post ultrasound-guided core biopsy of LEFT axillary mass. EXAM: 3D DIAGNOSTIC LEFT MAMMOGRAM POST ULTRASOUND BIOPSY COMPARISON:  Previous exam(s). FINDINGS: 3D Mammographic images were obtained following ultrasound pain guided biopsy of and LEFT axillary mass and placement of a HydroMARK coil shaped clip. The biopsy marking clip  is not visible mammogram, due to the depth of the lesion. Prior LEFT shoulder arthroplasty. IMPRESSION: LEFT axillary mass clip is not visible mammographically. Final Assessment: Post Procedure Mammograms for Marker Placement Electronically Signed   By: Nolon Nations M.D.   On: 07/09/2022 13:52  [place] AXILLARY NODE CORE BIOPSY LEFT  Result Date: 07/09/2022 CLINICAL DATA:  Patient presents for ultrasound-guided core biopsy of LEFT axillary mass. Patient has history biopsies LEFT axillary and RIGHT inguinal lymph nodes, both benign. History of RIGHT breast cancer and sarcoidosis. EXAM: [place] AXILLARY NODE CORE BIOPSY LEFT COMPARISON:  Previous exam(s). PROCEDURE: I met with the patient and we discussed the procedure of ultrasound-guided biopsy, including benefits and alternatives. We discussed the high likelihood of a successful procedure. We discussed the risks of the procedure, including infection, bleeding, tissue injury, clip migration, and inadequate sampling. Informed written consent was given. The usual time-out protocol was performed immediately prior to the procedure. Using sterile technique and 1% Lidocaine and lidocaine with epinephrine as local anesthetic, under direct ultrasound visualization, a 14 gauge spring-loaded device was used to perform biopsy of  LEFT axillary mass using a LATERAL approach. At the conclusion of the procedure spiral [facility] tissue marker clip was deployed into the biopsy cavity. Follow up 2 view mammogram was performed and dictated separately. IMPRESSION: Ultrasound guided biopsy of LEFT axillary mass. No apparent complications. Electronically Signed   By: Nolon Nations M.D.   On: 07/09/2022 13:50  CT RENAL ABD W/WO  Result Date: 07/03/2022 CLINICAL DATA:  Characterize suspicious left lower pole renal lesion identified by prior PET-CT patient with established history of breast cancer and sarcoid * Tracking Code: BO * EXAM: CT ABDOMEN WITHOUT AND WITH CONTRAST TECHNIQUE:  Multidetector CT imaging of the abdomen was performed following the standard protocol before and following the bolus administration of intravenous contrast. RADIATION DOSE REDUCTION: This exam was performed according to the departmental dose-optimization program which includes automated exposure control, adjustment of the mA and/or kV according to patient size and/or use of iterative reconstruction technique. CONTRAST:  168m OMNIPAQUE IOHEXOL 350 MG/ML SOLN COMPARISON:  PET-CT 06/18/2022 FINDINGS: Examination is generally limited by patient body habitus and photopenia, particularly in the vicinity of the renal lesion of interest. Lower chest: No acute abnormality. Hepatobiliary: No solid liver abnormality is seen. No gallstones, gallbladder wall thickening, or biliary dilatation. Pancreas: Unremarkable. No pancreatic ductal dilatation or surrounding inflammatory changes. Spleen: Normal in size without significant abnormality. Adrenals/Urinary Tract: Adrenal glands are unremarkable. A partially exophytic lesion of the posterior inferior pole of the left kidney measures 1.3 x 1.2 cm, and is of intrinsic fluid attenuation on precontrast imaging, however does appear to demonstrate enhancement to soft tissue attenuation on postcontrast images (precontrast HU = 4, postcontrast HU = 40 (series 11, image 76). Examination in the vicinity of this lesion is unfortunately somewhat limited by streak artifact related to patient body habitus in the adjacent lumbar spine. There are additional simple appearing bilateral renal cortical cysts as well as subcentimeter lesions too small to characterize although almost certainly additional tiny cysts, as well as lobulated bilateral renal cortical scarring, benign findings, for which no specific further follow-up or characterization is required. Stomach/Bowel: Stomach is within normal limits. No evidence of bowel wall thickening, distention, or inflammatory changes. Vascular/Lymphatic:  Aortic atherosclerosis. Enlarged retroperitoneal and bilateral iliac lymph nodes are partially imaged on today's examination and not significantly changed compared to recent prior PET-CT (series 11, image 109, 105). Other: No abdominal wall hernia or abnormality. No ascites. Musculoskeletal: S shaped scoliosis of the lumbar spine. Unchanged small soft tissue nodule or lymph node adjacent to the head of the left twelfth rib and T12 vertebral body (series 11, image 28). IMPRESSION: 1. A partially exophytic lesion of the posterior inferior pole of the left kidney measures 1.3 x 1.2 cm, and is of intrinsic fluid attenuation on precontrast imaging, however does appear to demonstrate enhancement to soft tissue attenuation on postcontrast images. Examination in the vicinity of this lesion is unfortunately somewhat limited by streak artifact related to patient body habitus in the adjacent lumbar spine however the presence of FDG avidity associated with this lesion on prior examination and measurable change in attenuation following contrast administration is highly suspicious for a small, poorly enhancing renal cell carcinoma. 2. Enlarged, previously FDG avid retroperitoneal and partially imaged iliac lymph nodes, unchanged compared to recent prior PET-CT. 3. Unchanged small soft tissue nodule or lymph node adjacent to the head of the left twelfth rib and T12 vertebral body, previously FDG avid. Aortic Atherosclerosis (ICD10-I70.0). Electronically Signed   By: AJamse MeadD.  On: 07/03/2022 13:03   MM DIAG BREAST TOMO UNI LEFT  Result Date: 06/19/2022 CLINICAL DATA:  Patient with history of right breast cancer now presenting with new left axillary adenopathy on prior cross-sectional imaging. EXAM: DIGITAL DIAGNOSTIC UNILATERAL LEFT MAMMOGRAM WITH TOMOSYNTHESIS; [place] AXILLARY LEFT TECHNIQUE: Left digital diagnostic mammography and breast tomosynthesis was performed.; Targeted ultrasound examination of the left axilla  was performed. COMPARISON:  Previous exam(s). ACR Breast Density Category b: There are scattered areas of fibroglandular density. FINDINGS: Partially visualized enlarged node within the left axilla. Adjacent postsurgical clips from prior node resection. No additional masses, calcifications or nonsurgical distortion identified within the left breast. Targeted ultrasound is performed, showing a 3.5 x 3.0 x 1.5 cm oval mass within the left axilla, likely an enlarged left axillary lymph node. IMPRESSION: Indeterminate mass within the left axilla, likely an abnormal left axillary lymph node. RECOMMENDATION: Ultrasound-guided core needle biopsy abnormal appearing left axillary lymph node. I have discussed the findings and recommendations with the patient. If applicable, a reminder letter will be sent to the patient regarding the next appointment. BI-RADS CATEGORY  4: Suspicious. Electronically Signed   By: Lovey Newcomer M.D.   On: 06/19/2022 14:45  [place] AXILLA LEFT  Result Date: 06/19/2022 CLINICAL DATA:  Patient with history of right breast cancer now presenting with new left axillary adenopathy on prior cross-sectional imaging. EXAM: DIGITAL DIAGNOSTIC UNILATERAL LEFT MAMMOGRAM WITH TOMOSYNTHESIS; [place] AXILLARY LEFT TECHNIQUE: Left digital diagnostic mammography and breast tomosynthesis was performed.; Targeted ultrasound examination of the left axilla was performed. COMPARISON:  Previous exam(s). ACR Breast Density Category b: There are scattered areas of fibroglandular density. FINDINGS: Partially visualized enlarged node within the left axilla. Adjacent postsurgical clips from prior node resection. No additional masses, calcifications or nonsurgical distortion identified within the left breast. Targeted ultrasound is performed, showing a 3.5 x 3.0 x 1.5 cm oval mass within the left axilla, likely an enlarged left axillary lymph node. IMPRESSION: Indeterminate mass within the left axilla, likely an abnormal left axillary  lymph node. RECOMMENDATION: Ultrasound-guided core needle biopsy abnormal appearing left axillary lymph node. I have discussed the findings and recommendations with the patient. If applicable, a reminder letter will be sent to the patient regarding the next appointment. BI-RADS CATEGORY  4: Suspicious. Electronically Signed   By: Lovey Newcomer M.D.   On: 06/19/2022 14:45  NM PET Image Initial (PI) Skull Base To Thigh  Result Date: 06/19/2022 CLINICAL DATA:  Subsequent treatment strategy for breast cancer. EXAM: NUCLEAR MEDICINE PET SKULL BASE TO THIGH TECHNIQUE: 10.1 mCi F-18 FDG was injected intravenously. Full-ring PET imaging was performed from the skull base to thigh after the radiotracer. CT data was obtained and used for attenuation correction and anatomic localization. Fasting blood glucose: 95 mg/dl COMPARISON:  CT chest 05/25/2022 FINDINGS: Mediastinal blood pool activity: SUV max 2.89 Liver activity: SUV max NA NECK: Small soft tissue nodule within the left parotid gland measures 5 mm and has an SUV max of 6.43, image 26/4. Incidental CT findings: None. CHEST: Left axillary lymph node measures 2.7 x 1.7 cm and has an SUV max of 20.79, image 69/4. Small prevascular lymph node measures 0.6 by 1.0 cm and has an SUV max of 5.12, image 60/4. Tracer avid right hilar lymph node has an SUV max of 7.88, image 71/4. Within the posterior basal right upper lobe there is a lung nodule abutting the major fissure which measures 0.9 cm. Technically too small to reliably characterize this has an SUV max of  1.78, image 31/7. Not seen on the recent CT from 05/25/2022. Incidental CT findings: Aortic atherosclerosis. Coronary artery calcifications. Cardiac enlargement. ABDOMEN/PELVIS: Multiple tracer avid abdominopelvic and bilateral inguinal lymph nodes, including: Aortocaval lymph node measures 1.4 cm with SUV max of 10.06, image 121/4. On the previous exam this measured 0.7 cm with SUV max of 5.4 Right common iliac node  measures 1.2 cm with SUV max of 5.14, image 140/4. On the previous exam this measured 1.3 cm with SUV max 5.4. Left external iliac lymph node measures 1.7 cm with SUV max of 7.57, image 158/4. Previously 1.6 cm with SUV max of 13.98 Right inguinal lymph node measures 1.2 cm with SUV max of 7.82, image 168/4. Formally 0.8 cm with SUV max of 4.62. Left inguinal lymph node measures 1 cm with SUV max of 8.45, image 167/4. Previously 1.2 cm with SUV max of 12.98. There is a small tracer avid nodule between the crus of the left hemidiaphragm in the left lateral aspect of the T12 vertebral body which measures 8 mm with SUV max of 6.75, image 100/4. There is mild increased uptake throughout the spleen above background liver activity with SUV max of 4.67. No focal areas of increased uptake above background splenic activity noted. Spleen is normal in size measuring 10 cm in length. No abnormal tracer activity within the liver, pancreas, or adrenal glands Incidental CT findings: Aortic atherosclerosis. There is a intermediate attenuating, exophytic lesion arising off the posterior aspect of the lower pole of left kidney measuring 1 cm. There is mildly increased tracer uptake within this lesion within SUV max of 3.95, image 118/4. Not confidently identified on the previous PET-CT. SKELETON: No focal hypermetabolic activity to suggest skeletal metastasis. Incidental CT findings: Status post left shoulder arthroplasty. IMPRESSION: 1. The enlarging lymph node within the left axilla is tracer avid. In a patient with known breast cancer as well as sarcoid findings may reflect metastatic adenopathy and or granulomatous inflammation or infection. 2. Similar appearance of enlarged and tracer avid retroperitoneal, pelvic and bilateral inguinal lymph nodes. Findings are nonspecific reflect metastatic adenopathy or granulomatous inflammation in a patient with known breast cancer as well as sarcoid. 3. There is mild increased tracer uptake  associated with the spleen above background liver activity. No focal areas of increased uptake above background splenic activity noted. Findings are nonspecific but may reflect post treatment change. 4. There is a small tracer avid nodule between the crus of the left hemidiaphragm in the left lateral aspect of the T12 vertebral body. This is favored to represent a small tracer avid which may reflect underlying granulomatous inflammation or metastatic disease. 5. There is a small tracer avid nodule within the left parotid gland which is nonspecific. This may represent a benign or malignant primary parotid neoplasm. Granulomatous inflammation not excluded. 6. There is a 0.9 cm nodule within the posterior basal right upper lobe which is too small to reliably characterize by PET-CT. This exhibits mild tracer uptake with SUV max of 1.78. This nodule is not visualized on the CT from 05/25/2022. 7. There is a 1 cm exophytic lesion arising off the posterior aspect of the lower pole of left kidney which is intermediate in attenuation and exhibits mild increased tracer uptake. This is a new finding compared with the previous PET-CT. Recommend more definitive characterization with renal mass protocol CT or MRI without with contrast material. Electronically Signed   By: Kerby Moors M.D.   On: 06/19/2022 13:19     ASSESSMENT/PLAN  #  1 Right breast IDC cancer: 77 y.o. 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  She also remembers trying letrozole which she once again caused her severe arthralgias.  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  Last mammogram in August 2023 with no evidence of malignancy.  # 2 diffuse lymphadenopathy with most recent imaging suggesting enlarging axillary lymph node.  She in the past has had multiple biopsies and working diagnosis with granulomatous disease especially in the left axillary lymph node.  However given the rapid size change, we have elected to repeat biopsy as well as a PET/CT.  PET/CT showed enlarging lymph node within the left axilla which is tracer avid, similar appearance of enlarged and tracer avid retroperitoneal, pelvic and bilateral inguinal lymph nodes, findings are nonspecific and reflect metastatic adenopathy or granulomatous inflammation and patient with known breast cancer.    Pathology suggested marginal zone lymphoma.  She is completely clinically asymptomatic.  No B symptoms/bulky lymphadenopathy.  We have discussed about repeating labs today.  CBC showed mild leukopenia, mild thrombocytopenia with no significant change for the past 2 years. We have discussed about considering continued observation for marginal zone lymphoma and consider follow-up in 3 months with repeat labs and consider repeating imaging in 6 months unless she has any clinical symptoms of concern.  She is agreeable to these recommendations.  #3 1 cm exophytic lesion from the lower pole of left kidney, followed up with urology and they recommended surveillance.  #4 Right upper lobe lung nodule, she follows up with Dr. Chase Caller from pulmonary.  Total time spent: 40 minutes  *Total Encounter Time as defined by the Centers for Medicare and Medicaid Services includes, in addition to the face-to-face time of a patient visit (documented in the note above) non-face-to-face time: obtaining and reviewing outside history, ordering and reviewing medications, tests or procedures, care coordination (communications with other health care professionals or caregivers) and documentation in the medical  record.

## 2022-07-17 ENCOUNTER — Encounter: Payer: Self-pay | Admitting: Hematology and Oncology

## 2022-07-17 ENCOUNTER — Telehealth: Payer: Self-pay | Admitting: Hematology and Oncology

## 2022-07-17 LAB — HEPATITIS PANEL, ACUTE
HCV Ab: NONREACTIVE
Hep A IgM: NONREACTIVE
Hep B C IgM: NONREACTIVE
Hepatitis B Surface Ag: NONREACTIVE

## 2022-07-17 NOTE — Telephone Encounter (Signed)
Contacted patient to scheduled appointments. Patient is aware of appointments that are scheduled.   

## 2022-07-20 NOTE — Telephone Encounter (Signed)
HGD

## 2022-08-04 DIAGNOSIS — I888 Other nonspecific lymphadenitis: Secondary | ICD-10-CM | POA: Diagnosis not present

## 2022-08-04 DIAGNOSIS — R591 Generalized enlarged lymph nodes: Secondary | ICD-10-CM | POA: Diagnosis not present

## 2022-08-06 DIAGNOSIS — R591 Generalized enlarged lymph nodes: Secondary | ICD-10-CM | POA: Diagnosis not present

## 2022-08-06 DIAGNOSIS — R911 Solitary pulmonary nodule: Secondary | ICD-10-CM | POA: Diagnosis not present

## 2022-08-25 DIAGNOSIS — R591 Generalized enlarged lymph nodes: Secondary | ICD-10-CM | POA: Diagnosis not present

## 2022-08-25 DIAGNOSIS — D47Z9 Other specified neoplasms of uncertain behavior of lymphoid, hematopoietic and related tissue: Secondary | ICD-10-CM | POA: Diagnosis not present

## 2022-08-31 DIAGNOSIS — C8584 Other specified types of non-Hodgkin lymphoma, lymph nodes of axilla and upper limb: Secondary | ICD-10-CM | POA: Diagnosis not present

## 2022-08-31 DIAGNOSIS — C8331 Diffuse large B-cell lymphoma, lymph nodes of head, face, and neck: Secondary | ICD-10-CM | POA: Diagnosis not present

## 2022-08-31 DIAGNOSIS — D72820 Lymphocytosis (symptomatic): Secondary | ICD-10-CM | POA: Diagnosis not present

## 2022-09-10 DIAGNOSIS — N2889 Other specified disorders of kidney and ureter: Secondary | ICD-10-CM | POA: Diagnosis not present

## 2022-09-10 DIAGNOSIS — D869 Sarcoidosis, unspecified: Secondary | ICD-10-CM | POA: Diagnosis not present

## 2022-09-10 DIAGNOSIS — I510 Cardiac septal defect, acquired: Secondary | ICD-10-CM | POA: Diagnosis not present

## 2022-09-10 DIAGNOSIS — C8580 Other specified types of non-Hodgkin lymphoma, unspecified site: Secondary | ICD-10-CM | POA: Diagnosis not present

## 2022-09-10 DIAGNOSIS — Z9221 Personal history of antineoplastic chemotherapy: Secondary | ICD-10-CM | POA: Diagnosis not present

## 2022-09-10 DIAGNOSIS — Z853 Personal history of malignant neoplasm of breast: Secondary | ICD-10-CM | POA: Diagnosis not present

## 2022-09-10 DIAGNOSIS — Z79899 Other long term (current) drug therapy: Secondary | ICD-10-CM | POA: Diagnosis not present

## 2022-09-15 ENCOUNTER — Encounter: Payer: Self-pay | Admitting: Urology

## 2022-09-15 ENCOUNTER — Encounter: Payer: Self-pay | Admitting: Internal Medicine

## 2022-09-15 ENCOUNTER — Encounter: Payer: Self-pay | Admitting: Hematology and Oncology

## 2022-09-16 ENCOUNTER — Telehealth: Payer: Self-pay

## 2022-09-16 NOTE — Progress Notes (Signed)
In response to Pt's MyChart message, information has been provided to MD, director/assistant of nursing, chief of pathology, and patient relations supervisor. MD will be calling today to follow up with Pt.

## 2022-09-16 NOTE — Telephone Encounter (Signed)
Attempted to call pt regarding Mychart message voicing concerns about pathology. LVM for pt to call back so we can offer her appt 09/18/22 at 0915 with Dr Chryl Heck.

## 2022-09-21 DIAGNOSIS — Z23 Encounter for immunization: Secondary | ICD-10-CM | POA: Diagnosis not present

## 2022-10-02 ENCOUNTER — Encounter: Payer: Self-pay | Admitting: Hematology and Oncology

## 2022-10-14 DIAGNOSIS — C8304 Small cell B-cell lymphoma, lymph nodes of axilla and upper limb: Secondary | ICD-10-CM | POA: Diagnosis not present

## 2022-10-15 ENCOUNTER — Inpatient Hospital Stay: Payer: Medicare Other

## 2022-10-15 ENCOUNTER — Telehealth: Payer: Self-pay | Admitting: Cardiovascular Disease

## 2022-10-15 ENCOUNTER — Inpatient Hospital Stay: Payer: Medicare Other | Admitting: Hematology and Oncology

## 2022-10-15 DIAGNOSIS — R61 Generalized hyperhidrosis: Secondary | ICD-10-CM | POA: Diagnosis not present

## 2022-10-15 DIAGNOSIS — Z5111 Encounter for antineoplastic chemotherapy: Secondary | ICD-10-CM | POA: Diagnosis not present

## 2022-10-15 DIAGNOSIS — T82898A Other specified complication of vascular prosthetic devices, implants and grafts, initial encounter: Secondary | ICD-10-CM | POA: Diagnosis not present

## 2022-10-15 DIAGNOSIS — Z5112 Encounter for antineoplastic immunotherapy: Secondary | ICD-10-CM | POA: Diagnosis not present

## 2022-10-15 DIAGNOSIS — C884 Extranodal marginal zone B-cell lymphoma of mucosa-associated lymphoid tissue [MALT-lymphoma]: Secondary | ICD-10-CM | POA: Diagnosis not present

## 2022-10-15 DIAGNOSIS — C8580 Other specified types of non-Hodgkin lymphoma, unspecified site: Secondary | ICD-10-CM | POA: Diagnosis not present

## 2022-10-15 NOTE — Telephone Encounter (Signed)
Spoke to patient and to Sam at [facility]. Patient was receiving rituximab at [facility] today and had SOB and possible allergic reaction, which Sam explains is common with initial doses of rituximab. An EKG was done which showed PVC's and longer QTC than on previous EKG performed by [facility]. Patient was given steroids as part of her rituximab treatment, denied any SOB or chest pain when I spoke with her. Patient due for 1 year recall July 2024 so I made her an appt w/ Dr. Elease Hashimoto on on 11/02/22. Forwarded to Dr. Elease Hashimoto and RN.

## 2022-10-15 NOTE — Telephone Encounter (Signed)
[facility] called to let provider know that pt was seen today and they did an EKG and some changes were there PVS and prolonged QTC. Please advise

## 2022-10-16 ENCOUNTER — Telehealth: Payer: Self-pay | Admitting: Hematology and Oncology

## 2022-10-16 ENCOUNTER — Encounter (HOSPITAL_COMMUNITY): Payer: Self-pay | Admitting: Hematology and Oncology

## 2022-10-16 NOTE — Telephone Encounter (Signed)
I called Ms Elizabeth Ramirez. I got a phone call from Dr Hayden Rasmussen that a third review was sent to [facility]. Dr Corrie Dandy apparently agrees with pathology reading from [place] and indicated no presence of DLBCL.  I didn't have a copy of the report myself, it will be soon scanned into the media. I did relay this message to Ms Elizabeth Ramirez.  She was thankful for the call.

## 2022-10-18 ENCOUNTER — Encounter: Payer: Self-pay | Admitting: Hematology and Oncology

## 2022-10-21 NOTE — Progress Notes (Signed)
Pathology from [facility] printed and placed in envelope. Given to front registrar. Pt will pick up today.

## 2022-10-23 DIAGNOSIS — C8300 Small cell B-cell lymphoma, unspecified site: Secondary | ICD-10-CM | POA: Diagnosis not present

## 2022-10-25 ENCOUNTER — Encounter: Payer: Self-pay | Admitting: Cardiovascular Disease

## 2022-10-26 ENCOUNTER — Other Ambulatory Visit: Payer: Medicare Other

## 2022-10-26 DIAGNOSIS — E039 Hypothyroidism, unspecified: Secondary | ICD-10-CM | POA: Diagnosis not present

## 2022-10-27 DIAGNOSIS — C8580 Other specified types of non-Hodgkin lymphoma, unspecified site: Secondary | ICD-10-CM | POA: Diagnosis not present

## 2022-10-27 DIAGNOSIS — C8300 Small cell B-cell lymphoma, unspecified site: Secondary | ICD-10-CM | POA: Diagnosis not present

## 2022-10-30 ENCOUNTER — Encounter: Payer: Self-pay | Admitting: Internal Medicine

## 2022-10-30 ENCOUNTER — Encounter: Payer: Self-pay | Admitting: Urology

## 2022-10-30 NOTE — Telephone Encounter (Signed)
Mychart message sent by pt:  Elizabeth Ramirez [facility] (supporting Kalman Shan, MD)3 hours ago (11:07 AM)    I cancelled/postponed a chest scan scheduled this month.  As I wrote earlier, i have started Chemo treatments for a lymphoma diagnosis.  At some point I will have a PET Scan, likely in the next 6 weeks.  I will ask that a copy be sent to you for your purposes.  Is there anything in particular that should be included?! If so, can you please advise me or my team at [facility] of any specifics so they are included?   I am seeing Dr Duard Larsen at the [facility].    Thank you.     Sending to Dr. Marchelle Gearing for review.

## 2022-10-30 NOTE — Telephone Encounter (Signed)
The PET scan -> that they are going to go do at [facility] will cover any lymph node enlargement but it can also show the nodules that we were tracking.  Therefore I do not think anything else is needed other than knowing the results of the PET scan.  Should be able to get the results of the PET scan through [facility] Everywhere.  She can just alert me when the PET scan is done  Best wishes with her treatment plan

## 2022-10-31 ENCOUNTER — Encounter: Payer: Self-pay | Admitting: Cardiovascular Disease

## 2022-10-31 NOTE — Progress Notes (Unsigned)
Cardiology Office Note:    Date:  11/02/2022   ID:  SHARAE FAGERBERG, DOB May 27, 1946, MRN [id number]  PCP:  Gweneth Dimitri, MD  Cardiologist:  Kristeen Miss, MD    Referring MD: Gweneth Dimitri, MD   Chief Complaint  Patient presents with   Hyperlipidemia   Heart Murmur    Previous notes.   Elizabeth Ramirez is a 77 y.o. female who is being seen today for the evaluation of heart murmur  at the request of Gweneth Dimitri, MD.   Elizabeth Ramirez is a 77 y.o. female with a hx of hypothyroidism, breast cancer, obesity, right total knee replacement who we are asked to see today for evaluation of heart murmur.  Heart murmur was incidentally found on physical exam.  She denies having any episodes of chest pain or shortness of breath. She goes to water aerobics on a regular basis.  No cough or cold symptoms.   No seasonal allergies   Has had breast cancer twice ( right breast)   September 02, 2020:   Elizabeth Ramirez is seen today for follow up of her heart murmur , she is [employer] partner .  Had a knee replacement   Sees Dr. Darnelle Catalan.   A recent CT scan showed some coronary artery calcifications Has enlarged lymph nodes.   Thought to have sarcoidosis Has been exercising .  No cp with exercise , no DOE , 30 minutes, 3 times a week   Echo from 2018: normal LV function , Mild MR, mild TR   Has lost 50 lbs from Jan. 2021 Is not interested in starting a statin  Has agreed to do a coronary calcium score   December 31, 2021:   Elizabeth Ramirez is seen today for follow-up of her hyperlipidemia and coronary artery calcifications. Coronary calcium score of 291. This was 75 percentile for age-, race-, and sex-matched controls She has tried multiple statins for her hyperlipidemia but she does not tolerate them.  She is also tried Pralulent  but does not tolerate that either.  Recent lab work from 2 days ago reveals an LDL of 157.  Total cholesterol is 235.  HDL is 65.  Triglyceride level is 77.  I would like  for her to try Repatha or Inclisiran    WBC is 1.7 ,  has seen gus Magranat in the past .   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 [facility] also send samples to MD Dareen Piano for another opinion  Samples also sent to [facility]  Echo from [facility] , Shows normal LV function  Trivial MR   Is scheduled to possibly get Doxorubicin which can be cardiotoxic . If it turns out that she is getting cardiotoxic medications it is likely that we will need her followed in our cardio oncology clinic in the advanced heart failure clinic.  Wt is 209 lbs  She has seen [facility] for lipid clinic and is now interesting in pursuing a lipid lowering medication We discussed delaying any new lipids meds for new.  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  Additionally, no variants of uncertain significance (VUSs) were found.  The Breast/Ovarian gene panel offered by [facility] Basilio Cairo, MD) includes sequencing and  deletion/duplication analysis for the following 19 genes:  ATM, BARD1, BRCA1, BRCA2, BRIP1, CDH1, CHEK2, FANCC, MLH1, MSH2, MSH6, NBN, PALB2, PMS2, PTEN, RAD51C, RAD51D, TP53, and XRCC2.  This panel also includes deletion/duplication analysis (without sequencing) for one gene, EPCAM.  Date of report is January 14, 2015.     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   Sarcoid 05/02/2021   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   Right proximal bridge of nose ([id number])   Seasonal allergies    Thyroid disease    Vaccine counseling 05/02/2021   Vitamin D deficiency     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   BREAST LUMPECTOMY Right 2016   BREAST LUMPECTOMY WITH NEEDLE LOCALIZATION Right 02/11/2015   Procedure: WIRE BRACKETED RIGHT BREAST LUMPECTOMY ;  Surgeon: Glenna Fellows, MD;  Location: [facility];  Service: General;  Laterality: Right;   CATARACT EXTRACTION, BILATERAL  10-05-2017 left ; 11-09-2017 right   Dr Modena Nunnery    colonoscopy with biopsy  12/28/2003   neg   EVACUATION BREAST HEMATOMA Left 03/11/2021   Procedure: LEFT AXILLARY SEROMA DRAINAGE AND DRAIN PLACEMENT;  Surgeon: Emelia Loron, MD;  Location: [facility];  Service: General;  Laterality: Left;   LYMPH NODE DISSECTION Right 02/23/2020   Procedure:  right inguinal lymph node excision;  Surgeon: Ovidio Kin, MD;  Location: [facility];  Service: General;  Laterality: Right;   RADIOACTIVE SEED GUIDED AXILLARY SENTINEL LYMPH NODE Left 02/18/2021   Procedure: RADIOACTIVE SEED GUIDED LEFT AXILLARY LYMPH NODE EXCISION;  Surgeon: Emelia Loron, MD;  Location: [facility];  Service: General;  Laterality: Left;   REVERSE SHOULDER ARTHROPLASTY Left 07/14/2018   Procedure: REVERSE SHOULDER ARTHROPLASTY;  Surgeon: Francena Hanly, MD;  Location: [facility];  Service: Orthopedics;  Laterality: Left;  Interscalene Block   TONSILLECTOMY     1954   TOTAL KNEE ARTHROPLASTY Right 05/27/2015  Procedure: RIGHT TOTAL KNEE ARTHROPLASTY;  Surgeon: Ollen Gross, MD;  Location: [facility];  Service: Orthopedics;  Laterality: Right;   TOTAL KNEE ARTHROPLASTY Left 05/20/2020   Procedure: TOTAL KNEE ARTHROPLASTY;  Surgeon: Ollen Gross, MD;  Location: [facility];  Service: Orthopedics;  Laterality: Left;    WISDOM TOOTH EXTRACTION Bilateral 1965    Current Medications: Current Meds  Medication Sig   Calcium Carb-Cholecalciferol 600-10 MG-MCG TABS Take by mouth.   levothyroxine (SYNTHROID) 150 MCG tablet Take 150 mcg by mouth once a week. ON SUNDAYS   ondansetron (ZOFRAN) 8 MG tablet Take by mouth.   SYNTHROID 100 MCG tablet Take 100 mcg by mouth every morning.   [DISCONTINUED] Calcium Carbonate-Vitamin D 600-400 MG-UNIT tablet 2 tablets   Current Facility-Administered Medications for the 11/02/22 encounter (Office Visit) with Aubriana Ravelo, Deloris Ping, MD  Medication   0.9 %  sodium chloride infusion     Allergies:   Levofloxacin, Tetracyclines & related, Other, Anesthesia s-i-40 [propofol], Ciprofloxacin, Flonase [fluticasone], Ibuprofen, Nitrofurantoin monohyd macro, Praluent [alirocumab], Pravastatin, Rosuvastatin, and Scopolamine   Social History   Socioeconomic History   Marital status: Media planner    Spouse name: Not on file    Number of children: 0   Years of education: Not on file   Highest education level: Not on file  Occupational History   Not on file  Tobacco Use   Smoking status: Former    Packs/day: 1.00    Years: 9.00    Additional pack years: 0.00    Total pack years: 9.00    Types: Cigarettes    Quit date: 06/16/1983    Years since quitting: 39.4   Smokeless tobacco: Never  Vaping Use   Vaping Use: Never used  Substance and Sexual Activity   Alcohol use: Yes    Alcohol/week: 2.0 standard drinks of alcohol    Types: 2 Glasses of wine per week    Comment: Twice a week   Drug use: No   Sexual activity: Not on file  Other Topics Concern   Not on file  Social History Narrative   Not on file   Social Determinants of Health   Financial Resource Strain: Not on file  Food Insecurity: Not on file  Transportation Needs: Not on file  Physical Activity: Not on file  Stress: Not on file  Social Connections: Not on file     Family History: The patient's family history includes Breast cancer in her cousin; Colitis in her paternal grandmother; Colon cancer in her cousin; Dementia in her mother; Diabetes in her brother, paternal aunt, and paternal grandfather; Emphysema in her paternal uncle; Heart Problems in her paternal aunt, paternal uncle, and paternal uncle; Heart attack in her maternal grandfather; Kidney failure in her paternal aunt; Leukemia (age of onset: 72) in her brother; Lung cancer in her paternal uncle; Multiple myeloma in her maternal aunt; Multiple myeloma (age of onset: 47) in her maternal aunt; Other in her paternal aunt; Other (age of onset: 74) in her mother; Ovarian cancer in her maternal aunt; Parkinson's disease in her maternal aunt; Pulmonary fibrosis in her cousin, father, and paternal aunt; Stomach cancer in her maternal grandmother; Stroke in her paternal aunt and paternal grandfather; Thyroid disease in her brother. ROS:   Please see the history of present illness.     All  other systems reviewed and are negative.  EKGs/Labs/Other Studies Reviewed:      EKG:      Recent Labs: 07/16/2022: ALT 16;  BUN 17; Creatinine 1.06; Hemoglobin 14.0; Platelets 111; Potassium 4.5; Sodium 140  Recent Lipid Panel    Component Value Date/Time   CHOL 235 (H) 12/30/2021 1137   TRIG 77 12/30/2021 1137   HDL 65 12/30/2021 1137   CHOLHDL 3.6 12/30/2021 1137   LDLCALC 157 (H) 12/30/2021 1137    Physical Exam:    Physical Exam: Blood pressure 124/76, pulse 88, height 5\' 4"  (1.626 m), weight 209 lb 3.2 oz (94.9 kg), SpO2 97 %.       GEN:  Well nourished, well developed in no acute distress HEENT: Normal NECK: No JVD; No carotid bruits LYMPHATICS: No lymphadenopathy CARDIAC: RRR , no murmurs, rubs, gallops RESPIRATORY:  Clear to auscultation without rales, wheezing or rhonchi  ABDOMEN: Soft, non-tender, non-distended MUSCULOSKELETAL:  No edema; No deformity  SKIN: Warm and dry NEUROLOGIC:  Alert and oriented x 3   ECG :    ASSESSMENT:    1. Chronic combined systolic and diastolic heart failure (HCC)      PLAN:       Soft systolic murmur.   stable  2.  Coronary artery calcifications:        3.  Hyperlipidemia: She has met with our lipid clinic.  She is now inclined to agree with taking a PCSK9 inhibitor or perhaps inclisiran.  At this time I have asked her to hold off and not start any new injectable medications until after she is stable on her IV chemotherapies.  4.  B cell lymphoma:     Is scheduled to possibly get Doxorubicin which can be cardiotoxic . If it turns out that she is getting cardiotoxic medications it is likely that we will need her followed in our cardio oncology clinic in the advanced heart failure clinic.   Medication Adjustments/Labs and Tests Ordered: Current medicines are reviewed at length with the patient today.  Concerns regarding medicines are outlined above.  No orders of the defined types were placed in this  encounter.  No orders of the defined types were placed in this encounter.     Signed, Kristeen Miss, MD  11/02/2022 1:33 PM    [facility]

## 2022-11-02 ENCOUNTER — Encounter: Payer: Self-pay | Admitting: Cardiovascular Disease

## 2022-11-02 ENCOUNTER — Other Ambulatory Visit: Payer: Medicare Other

## 2022-11-02 ENCOUNTER — Ambulatory Visit: Payer: Medicare Other | Attending: Cardiovascular Disease | Admitting: Cardiovascular Disease

## 2022-11-02 VITALS — BP 124/76 | HR 88 | Ht 64.0 in | Wt 209.2 lb

## 2022-11-02 DIAGNOSIS — I5042 Chronic combined systolic (congestive) and diastolic (congestive) heart failure: Secondary | ICD-10-CM

## 2022-11-02 NOTE — Patient Instructions (Signed)
Medication Instructions:  Your physician recommends that you continue on your current medications as directed. Please refer to the Current Medication list given to you today.  *If you need a refill on your cardiac medications before your next appointment, please call your pharmacy*     Follow-Up: At [facility], you and your health needs are our priority.  As part of our continuing mission to provide you with exceptional heart care, we have created designated Provider Care Teams.  These Care Teams include your primary Cardiologist (physician) and Advanced Practice Providers (APPs -  Physician Assistants and Nurse Practitioners) who all work together to provide you with the care you need, when you need it.   Your next appointment:   1 year(s)  Provider:   Philip Nahser, MD     

## 2022-11-05 DIAGNOSIS — Z5111 Encounter for antineoplastic chemotherapy: Secondary | ICD-10-CM | POA: Diagnosis not present

## 2022-11-05 DIAGNOSIS — C8580 Other specified types of non-Hodgkin lymphoma, unspecified site: Secondary | ICD-10-CM | POA: Diagnosis not present

## 2022-11-05 DIAGNOSIS — Z5112 Encounter for antineoplastic immunotherapy: Secondary | ICD-10-CM | POA: Diagnosis not present

## 2022-11-05 DIAGNOSIS — R002 Palpitations: Secondary | ICD-10-CM | POA: Diagnosis not present

## 2022-11-17 ENCOUNTER — Telehealth: Payer: Self-pay | Admitting: Internal Medicine

## 2022-11-17 DIAGNOSIS — R591 Generalized enlarged lymph nodes: Secondary | ICD-10-CM

## 2022-11-17 DIAGNOSIS — R911 Solitary pulmonary nodule: Secondary | ICD-10-CM

## 2022-11-17 NOTE — Telephone Encounter (Signed)
That is fine to change CT chest wo contrast sto Sept 2024 and followup at that time

## 2022-11-17 NOTE — Telephone Encounter (Signed)
Routing to Dr. Ramaswamy as an FYI 

## 2022-11-19 NOTE — Telephone Encounter (Signed)
ATC X1 LVM for patient to call the office back 

## 2022-11-19 NOTE — Telephone Encounter (Signed)
Pt called back for missed call, says to call her back tomorrow

## 2022-11-20 NOTE — Telephone Encounter (Signed)
I have called pt and again, no answer- LMTCB   I went ahead and ordered the scan for Sept 2024

## 2022-11-26 DIAGNOSIS — Z79899 Other long term (current) drug therapy: Secondary | ICD-10-CM | POA: Diagnosis not present

## 2022-11-26 DIAGNOSIS — C8588 Other specified types of non-Hodgkin lymphoma, lymph nodes of multiple sites: Secondary | ICD-10-CM | POA: Diagnosis not present

## 2022-11-26 DIAGNOSIS — Z5112 Encounter for antineoplastic immunotherapy: Secondary | ICD-10-CM | POA: Diagnosis not present

## 2022-11-26 DIAGNOSIS — Z5111 Encounter for antineoplastic chemotherapy: Secondary | ICD-10-CM | POA: Diagnosis not present

## 2022-11-26 DIAGNOSIS — C8580 Other specified types of non-Hodgkin lymphoma, unspecified site: Secondary | ICD-10-CM | POA: Diagnosis not present

## 2022-11-27 ENCOUNTER — Encounter: Payer: Self-pay | Admitting: Urology

## 2022-11-27 ENCOUNTER — Encounter: Payer: Self-pay | Admitting: Internal Medicine

## 2022-12-01 NOTE — Telephone Encounter (Signed)
Dr. Marchelle Gearing please advise on the following My Chart message:   [facility] (supporting Kalman Shan, MD)4 days ago    Your office recently inquired when my last chemo treatment is scheduled.  It should be August 16.  Thereafter, they sent an order for me to have a chest scan in September.    HOWEVER, I am having a comprehensive PET Scan at [facility] on July 2.  If a copy of the results are sent to you, will that suffice for your purposes?  I am trying to limit my exposure to radiation and am trying to consolidate if possible.  I will have [facility] send those results to you.  Then, if they are not sufficient, please advise and we can schedule what is needed.  Thanks for your consideration.   Thank you

## 2022-12-01 NOTE — Telephone Encounter (Signed)
I thought I previously was okay with suggest getting the PET scan at [facility] and to cancel the CT scan that we have ordered here in [place].  So please let her know that I am just okay with the PET scan at [facility]

## 2022-12-08 DIAGNOSIS — C8300 Small cell B-cell lymphoma, unspecified site: Secondary | ICD-10-CM | POA: Diagnosis not present

## 2022-12-14 ENCOUNTER — Other Ambulatory Visit: Payer: Self-pay | Admitting: Family Medicine

## 2022-12-14 DIAGNOSIS — Z1231 Encounter for screening mammogram for malignant neoplasm of breast: Secondary | ICD-10-CM

## 2022-12-15 DIAGNOSIS — N289 Disorder of kidney and ureter, unspecified: Secondary | ICD-10-CM | POA: Diagnosis not present

## 2022-12-15 DIAGNOSIS — K5790 Diverticulosis of intestine, part unspecified, without perforation or abscess without bleeding: Secondary | ICD-10-CM | POA: Diagnosis not present

## 2022-12-15 DIAGNOSIS — C770 Secondary and unspecified malignant neoplasm of lymph nodes of head, face and neck: Secondary | ICD-10-CM | POA: Diagnosis not present

## 2022-12-15 DIAGNOSIS — C8580 Other specified types of non-Hodgkin lymphoma, unspecified site: Secondary | ICD-10-CM | POA: Diagnosis not present

## 2022-12-15 DIAGNOSIS — C8308 Small cell B-cell lymphoma, lymph nodes of multiple sites: Secondary | ICD-10-CM | POA: Diagnosis not present

## 2022-12-18 DIAGNOSIS — C8300 Small cell B-cell lymphoma, unspecified site: Secondary | ICD-10-CM | POA: Diagnosis not present

## 2022-12-18 DIAGNOSIS — Z79630 Long term (current) use of alkylating agent: Secondary | ICD-10-CM | POA: Diagnosis not present

## 2022-12-18 DIAGNOSIS — Z79620 Long term (current) use of immunosuppressive biologic: Secondary | ICD-10-CM | POA: Diagnosis not present

## 2022-12-18 DIAGNOSIS — C8580 Other specified types of non-Hodgkin lymphoma, unspecified site: Secondary | ICD-10-CM | POA: Diagnosis not present

## 2022-12-18 DIAGNOSIS — Z5112 Encounter for antineoplastic immunotherapy: Secondary | ICD-10-CM | POA: Diagnosis not present

## 2022-12-18 DIAGNOSIS — Z79633 Long term (current) use of mitotic inhibitor: Secondary | ICD-10-CM | POA: Diagnosis not present

## 2022-12-18 DIAGNOSIS — Z5111 Encounter for antineoplastic chemotherapy: Secondary | ICD-10-CM | POA: Diagnosis not present

## 2022-12-20 ENCOUNTER — Encounter: Payer: Self-pay | Admitting: Internal Medicine

## 2022-12-23 NOTE — Telephone Encounter (Signed)
Received a MyChart message from patient stating that she had a PET scan done at [facility] on 12/15/22. I was able to find a copy of the report in her chart. Below is a copy of the impression:   "Pamala Hurry, MD - 12/15/2022  Formatting of this note might be different from the original. Procedure: FDG PET SBMT W NONDIAGNOSTIC CONCURRENT CT SUBSEQUENT  Indication: 77 years Female Hematologic malignancy, assess treatment response, C85.80 Other specified types of non-hodgkin lymphoma, unspecified site (CMS/HHS-HCC).  Radiotracer: 11.13 mCi F18-FDG, intravenously.  Technique: PET imaging was performed from the skull base to the mid-thigh following intravenous administration of radiotracer. A single breath-hold CT scan was performed at quiet end-expiration for anatomic localization and attenuation correction.  Serum glucose: 94 mg/dL Uptake time: 60 min IV Contrast: None  Comparison studies: PET/CT dated 06/18/2022, 02/08/2020  FINDINGS:  Visualized Head & Neck: - Lymph Nodes: No hypermetabolic cervical lymphadenopathy. - Thyroid: No suspicious tracer uptake. - Other: No suspicious tracer uptake.  Chest: - Lymph Nodes: No hypermetabolic axillary, mediastinal, or hilar lymphadenopathy, resolved from prior. Changes of bilateral axillary lymph node dissections. - Lungs & Pleura: No suspicious tracer uptake. No pulmonary consolidation, concerning nodules or significant effusions. Previously noted pulmonary nodule in the right upper lobe has resolved. - Heart & Pericardium: No suspicious tracer uptake.  Abdomen & Pelvis: - Lymph Nodes: No hypermetabolic abdominal, pelvic, mesenteric, or inguinal lymphadenopathy, significantly improved from prior. Questionable mildly avid porta hepatic node measuring 1.2 cm (143). - Liver: No focal hypermetabolic activity above background liver. - Biliary & Gallbladder: No suspicious tracer uptake. - Spleen: No focal hypermetabolic activity above background  spleen. Normal activity relative to liver. - Pancreas: No suspicious tracer uptake. - Adrenal Glands: No suspicious tracer uptake. - Kidneys: Normal urinary excretion without hydronephrosis. No hypermetabolic masses. Similar hypoattenuating left renal lesions, the largest of which is consistent with a cyst, and the smaller of which are too small to accurately characterize. - Genitourinary: No suspicious tracer uptake. Diverticulosis. - Vasculature: No suspicious tracer uptake. - Gastrointestinal: Physiologic uptake without suspicious focal uptake. - Peritoneum: No suspicious tracer uptake.  Musculoskeletal: - Bones: No suspicious osseous lesions. Diffuse marrow activity is likely secondary to treatment effect. Left shoulder arthroplasty. - Soft Tissues: No suspicious hypermetabolic lesions. Partial right mastectomy.  IMPRESSION: 1. Resolved hypermetabolic lymphadenopathy ([place] 1). 2. Resolved right upper lobe pulmonary nodule, likely infectious or inflammatory.     Electronically Reviewed by: Melonie Florida, MD, [facility] Electronically Reviewed on: 12/15/2022 4:48 PM  I have reviewed the images and concur with the above findings.  Electronically Signed by: Pamala Hurry, MD, [facility] Electronically Signed on: 12/15/2022 5:01 PM"  Patient wanted to know if this would be good enough for her sarcoidosis monitoring and if she needed to schedule a follow up. MR, can you please advise? Thanks!

## 2022-12-25 NOTE — Telephone Encounter (Signed)
Thank you for sharing this great news Elizabeth Ramirez . I Am so happy for your and excited at the news

## 2023-01-01 DIAGNOSIS — E039 Hypothyroidism, unspecified: Secondary | ICD-10-CM | POA: Diagnosis not present

## 2023-01-01 DIAGNOSIS — R002 Palpitations: Secondary | ICD-10-CM | POA: Diagnosis not present

## 2023-01-01 DIAGNOSIS — I493 Ventricular premature depolarization: Secondary | ICD-10-CM | POA: Diagnosis not present

## 2023-01-01 DIAGNOSIS — I2584 Coronary atherosclerosis due to calcified coronary lesion: Secondary | ICD-10-CM | POA: Diagnosis not present

## 2023-01-01 DIAGNOSIS — C8510 Unspecified B-cell lymphoma, unspecified site: Secondary | ICD-10-CM | POA: Diagnosis not present

## 2023-01-01 DIAGNOSIS — Z87891 Personal history of nicotine dependence: Secondary | ICD-10-CM | POA: Diagnosis not present

## 2023-01-01 DIAGNOSIS — Z803 Family history of malignant neoplasm of breast: Secondary | ICD-10-CM | POA: Diagnosis not present

## 2023-01-05 DIAGNOSIS — R21 Rash and other nonspecific skin eruption: Secondary | ICD-10-CM | POA: Diagnosis not present

## 2023-01-05 DIAGNOSIS — Z6838 Body mass index (BMI) 38.0-38.9, adult: Secondary | ICD-10-CM | POA: Diagnosis not present

## 2023-01-07 DIAGNOSIS — R21 Rash and other nonspecific skin eruption: Secondary | ICD-10-CM | POA: Diagnosis not present

## 2023-01-07 DIAGNOSIS — K5903 Drug induced constipation: Secondary | ICD-10-CM | POA: Diagnosis not present

## 2023-01-07 DIAGNOSIS — Z5111 Encounter for antineoplastic chemotherapy: Secondary | ICD-10-CM | POA: Diagnosis not present

## 2023-01-07 DIAGNOSIS — T451X5A Adverse effect of antineoplastic and immunosuppressive drugs, initial encounter: Secondary | ICD-10-CM | POA: Diagnosis not present

## 2023-01-07 DIAGNOSIS — Z79899 Other long term (current) drug therapy: Secondary | ICD-10-CM | POA: Diagnosis not present

## 2023-01-07 DIAGNOSIS — C8580 Other specified types of non-Hodgkin lymphoma, unspecified site: Secondary | ICD-10-CM | POA: Diagnosis not present

## 2023-01-07 DIAGNOSIS — Z5112 Encounter for antineoplastic immunotherapy: Secondary | ICD-10-CM | POA: Diagnosis not present

## 2023-01-07 DIAGNOSIS — G629 Polyneuropathy, unspecified: Secondary | ICD-10-CM | POA: Diagnosis not present

## 2023-01-07 DIAGNOSIS — Z79630 Long term (current) use of alkylating agent: Secondary | ICD-10-CM | POA: Diagnosis not present

## 2023-01-07 DIAGNOSIS — R112 Nausea with vomiting, unspecified: Secondary | ICD-10-CM | POA: Diagnosis not present

## 2023-01-28 DIAGNOSIS — G620 Drug-induced polyneuropathy: Secondary | ICD-10-CM | POA: Diagnosis not present

## 2023-01-28 DIAGNOSIS — Z5111 Encounter for antineoplastic chemotherapy: Secondary | ICD-10-CM | POA: Diagnosis not present

## 2023-01-28 DIAGNOSIS — K5903 Drug induced constipation: Secondary | ICD-10-CM | POA: Diagnosis not present

## 2023-01-28 DIAGNOSIS — Z853 Personal history of malignant neoplasm of breast: Secondary | ICD-10-CM | POA: Diagnosis not present

## 2023-01-28 DIAGNOSIS — Z7952 Long term (current) use of systemic steroids: Secondary | ICD-10-CM | POA: Diagnosis not present

## 2023-01-28 DIAGNOSIS — R110 Nausea: Secondary | ICD-10-CM | POA: Diagnosis not present

## 2023-01-28 DIAGNOSIS — Z79899 Other long term (current) drug therapy: Secondary | ICD-10-CM | POA: Diagnosis not present

## 2023-01-28 DIAGNOSIS — Z170 Estrogen receptor positive status [ER+]: Secondary | ICD-10-CM | POA: Diagnosis not present

## 2023-01-28 DIAGNOSIS — T451X5A Adverse effect of antineoplastic and immunosuppressive drugs, initial encounter: Secondary | ICD-10-CM | POA: Diagnosis not present

## 2023-01-28 DIAGNOSIS — Z5112 Encounter for antineoplastic immunotherapy: Secondary | ICD-10-CM | POA: Diagnosis not present

## 2023-01-28 DIAGNOSIS — C8580 Other specified types of non-Hodgkin lymphoma, unspecified site: Secondary | ICD-10-CM | POA: Diagnosis not present

## 2023-01-28 DIAGNOSIS — R21 Rash and other nonspecific skin eruption: Secondary | ICD-10-CM | POA: Diagnosis not present

## 2023-02-08 DIAGNOSIS — E039 Hypothyroidism, unspecified: Secondary | ICD-10-CM | POA: Diagnosis not present

## 2023-02-09 ENCOUNTER — Ambulatory Visit: Admission: RE | Admit: 2023-02-09 | Payer: Medicare Other | Source: Ambulatory Visit

## 2023-02-09 DIAGNOSIS — Z1231 Encounter for screening mammogram for malignant neoplasm of breast: Secondary | ICD-10-CM | POA: Diagnosis not present

## 2023-02-18 ENCOUNTER — Inpatient Hospital Stay: Payer: Medicare Other | Attending: Hematology and Oncology | Admitting: Hematology and Oncology

## 2023-02-18 ENCOUNTER — Encounter: Payer: Self-pay | Admitting: Hematology and Oncology

## 2023-02-18 VITALS — BP 99/75 | HR 91 | Temp 98.1°F | Resp 19 | Wt 204.2 lb

## 2023-02-18 DIAGNOSIS — Z87891 Personal history of nicotine dependence: Secondary | ICD-10-CM | POA: Insufficient documentation

## 2023-02-18 DIAGNOSIS — Z1231 Encounter for screening mammogram for malignant neoplasm of breast: Secondary | ICD-10-CM | POA: Diagnosis not present

## 2023-02-18 DIAGNOSIS — Z9221 Personal history of antineoplastic chemotherapy: Secondary | ICD-10-CM | POA: Diagnosis not present

## 2023-02-18 DIAGNOSIS — C50411 Malignant neoplasm of upper-outer quadrant of right female breast: Secondary | ICD-10-CM

## 2023-02-18 DIAGNOSIS — C8300 Small cell B-cell lymphoma, unspecified site: Secondary | ICD-10-CM | POA: Insufficient documentation

## 2023-02-18 DIAGNOSIS — Z170 Estrogen receptor positive status [ER+]: Secondary | ICD-10-CM | POA: Diagnosis not present

## 2023-02-18 DIAGNOSIS — Z09 Encounter for follow-up examination after completed treatment for conditions other than malignant neoplasm: Secondary | ICD-10-CM | POA: Diagnosis not present

## 2023-02-18 NOTE — Progress Notes (Signed)
[facility]  Telephone:([phone] Fax:([phone]    ID: ALAIRE HETTLER DOB: 03-16-1946  MR#: [id number]  ZHY#:[id number]  Patient Care Team: Gweneth Dimitri, MD as PCP - General (Family Medicine) Nahser, Deloris Ping, MD as PCP - Cardiology (Cardiology) Glenna Fellows, MD (Inactive) as Consulting Physician (General Surgery) Carrington Clamp, MD as Consulting Physician (Obstetrics and Gynecology) Francena Hanly, MD as Consulting Physician (Orthopedic Surgery) Emelia Loron, MD as Consulting Physician (General Surgery) Rachel Moulds, MD as Consulting Physician (Hematology and Oncology)  CHIEF COMPLAINT:  marginal zone lymphoma   CURRENT TREATMENT: Continued observation  INTERVAL HISTORY:  She is here for a follow up.  Since last visit she was treated for nodal marginal zone lymphoma in [facility], she just completed her chemotherapy.  She said she received red devil for the first 3 cycles and then was transitioned to R-CVP for 3 more cycles.  She had an end of the treatment PET/CT which showed complete response. She says that she has done well overall and is pleased with the treatment response.  She will continue on rituximab maintenance at [facility].  She wants to continue to keep in touch with [place] as well. Mammogram August 2024 with no findings suspicious for malignancy, expected postlumpectomy changes involving the right breast.  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 This was estrogen and progesterone receptor positive, HER-2 not amplified. Margins were ample. She participated in the MA-27 study and received anastrozole between June 2005 and June 2010, at which  time she was released from follow-up.  More recently, Kyrsti had screening bilateral mammography showing a suspicious area in the right breast and on 12/14/2014 underwent right diagnostic mammography at the breast Center. The breast density was category A. There was a group of pleomorphic calcifications in the upper central right breast spanning up to 2.8 cm. Biopsy of this area was obtained 12/21/2014 ([id number]) and showed ductal carcinoma in situ, grade 2 or 3, estrogen receptor 95% positive, and progesterone receptor 60% positive, both with strong staining intensity.  The patient's case was presented at the multidisciplinary breast cancer conference 01/02/2015. At that time a preliminary plan was proposed, namely genetics counseling and if no mutation noted breast MRI to be followed by lumpectomy, radiation, and anti-estrogens.  Her subsequent history is as detailed below.  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 Two clonal B cell populations by flow cytometry, CD10+ and CD10- each population 23%. Negative for BCL2 or BCL6 rearrangement. IGH::MYC rearrangment or other MYC rearrangement by FISH.  REVIEW BY [facility] 10/15/2022: Marginal zone lymphoma with florid reactive germinal centers. REVIEW BY [facility] 12/03/2022: Reactive follicular hyperplasia with increased interfollicular B-cells suggestive of involvement by nodal marginal zone lymphoma. 09/10/2022: Echo - EF > 55%, Normal LV systolic function  10/15/2022: Cycle 1 R-CHOP with growth factor support  11/05/2022: Cycle 2 R-CHOP with G-CSF support  11/26/2022: Cycle 3 R-CHOP with G-CSF support 12/15/2022: PET/CT - Resolved hypermetabolic lymphadenopathy ([place] 1).  2. Resolved right upper lobe pulmonary nodule, likely infectious or inflammatory. 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  Additionally, no variants of uncertain significance (VUSs) were found.  The Breast/Ovarian gene panel offered by [facility] Basilio Cairo, MD) includes sequencing and deletion/duplication analysis for the following 19 genes:  ATM, BARD1, BRCA1, BRCA2, BRIP1, CDH1, CHEK2, FANCC, MLH1, MSH2, MSH6, NBN, PALB2, PMS2, PTEN, RAD51C, RAD51D, TP53, and XRCC2.  This panel also includes deletion/duplication analysis (without sequencing) for one gene, EPCAM.  Date of report is January 14, 2015.     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   Sarcoid 05/02/2021   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   Right proximal bridge of nose ([id number])   Seasonal allergies    Thyroid disease    Vaccine counseling 05/02/2021   Vitamin D deficiency     PAST SURGICAL  HISTORY: 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   BREAST LUMPECTOMY Right 2016   BREAST LUMPECTOMY WITH NEEDLE LOCALIZATION Right 02/11/2015   Procedure: WIRE BRACKETED RIGHT BREAST LUMPECTOMY ;  Surgeon: Glenna Fellows, MD;  Location: [facility];  Service: General;  Laterality: Right;   CATARACT EXTRACTION, BILATERAL  10-05-2017 left ; 11-09-2017 right   Dr Modena Nunnery    colonoscopy with biopsy  12/28/2003   neg   EVACUATION BREAST HEMATOMA Left 03/11/2021   Procedure: LEFT AXILLARY SEROMA DRAINAGE AND DRAIN PLACEMENT;  Surgeon: Emelia Loron, MD;  Location: [facility];  Service: General;  Laterality: Left;   LYMPH NODE DISSECTION Right 02/23/2020   Procedure: right inguinal lymph node excision;  Surgeon: Ovidio Kin, MD;  Location: [facility];  Service: General;  Laterality: Right;   RADIOACTIVE SEED GUIDED AXILLARY SENTINEL LYMPH NODE Left 02/18/2021   Procedure: RADIOACTIVE SEED GUIDED LEFT AXILLARY LYMPH NODE EXCISION;  Surgeon: Emelia Loron, MD;  Location: [facility];  Service: General;  Laterality: Left;   REVERSE SHOULDER ARTHROPLASTY Left 07/14/2018   Procedure: REVERSE SHOULDER ARTHROPLASTY;  Surgeon: Francena Hanly, MD;  Location: [facility];  Service: Orthopedics;  Laterality: Left;  Interscalene Block   TONSILLECTOMY     1954   TOTAL KNEE ARTHROPLASTY Right 05/27/2015   Procedure: RIGHT TOTAL  KNEE ARTHROPLASTY;  Surgeon: Ollen Gross, MD;  Location: [facility];  Service: Orthopedics;  Laterality: Right;   TOTAL KNEE ARTHROPLASTY Left 05/20/2020   Procedure: TOTAL KNEE ARTHROPLASTY;  Surgeon: Ollen Gross, MD;  Location: [facility];  Service: Orthopedics;  Laterality: Left;    WISDOM TOOTH EXTRACTION Bilateral 1965    FAMILY HISTORY Family History  Problem Relation Age of Onset   Dementia Mother    Other Mother 4       "spot on  lung"; secondhand smoke exposure   Pulmonary fibrosis Father    Diabetes Brother    Thyroid disease Brother    Leukemia Brother 44       chronic lymphocytic    Stomach cancer Maternal Grandmother        dx. 30s   Heart attack Maternal Grandfather    Diabetes Paternal Grandfather    Stroke Paternal Grandfather    Colitis Paternal Grandmother    Parkinson's disease Maternal Aunt    Diabetes Paternal Aunt    Other Paternal Aunt        bowel obstruction   Heart Problems Paternal Uncle    Ovarian cancer Maternal Aunt        dx. 22s; paternal half sister of her mother   Multiple myeloma Maternal Aunt    Multiple myeloma Maternal Aunt 66   Kidney failure Paternal Aunt    Pulmonary fibrosis Paternal Aunt    Stroke Paternal Aunt    Heart Problems Paternal Aunt    Emphysema Paternal Uncle    Heart Problems Paternal Uncle    Lung cancer Paternal Uncle        smoker   Colon cancer Cousin    Breast cancer Cousin        dx. 100s   Pulmonary fibrosis Stephens November father died at the age of 72 from pulmonary fibrosis. Her mother died at age 42 with severe dementia. The patient had one brother no sisters.  Her brother has Parkinson's disease and chronic lymphoid leukemia.  Her maternal grandmother died from stomach cancer the age of 25. The patient's mother had a half sister with ovarian cancer.   GYNECOLOGIC HISTORY:  No LMP recorded. Patient is postmenopausal.  menarche age 64, she isGX P0. Menopause 2003. 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 At home it's her and Estanislado Emms , who also is in real estate, with her own investment business.Dois Davenport is a Catholic, though not currently practicing    ADVANCED DIRECTIVES:  Alvis Lemmings is Clinical biochemist of attorney   HEALTH MAINTENANCE: Social History   Tobacco Use   Smoking status: Former     Current packs/day: 0.00    Average packs/day: 1 pack/day for 9.0 years (9.0 ttl pk-yrs)    Types: Cigarettes    Start date: 06/15/1974    Quit date: 06/16/1983    Years since quitting: 39.7   Smokeless tobacco: Never  Vaping Use   Vaping status: Never Used  Substance Use Topics   Alcohol use: Yes    Alcohol/week: 2.0 standard drinks of alcohol    Types: 2 Glasses of wine per week    Comment: Twice a week   Drug use: No     Colonoscopy:  20/15, [place] she knee  PAP: up-to-date/[facility]  Bone density: October 2016; normal  Lipid panel:  Allergies  Allergen Reactions   Levofloxacin Other (See Comments)    Affected tendons, severe pain and swelling   Tetracyclines & Related Swelling    tongue swelling and peeling   Other Diarrhea, Rash and Other (See Comments)    Crabs - diarrhea. Bay leaves - rash in her mouth   Anesthesia S-I-40 [Propofol]     Other reaction(s): respiratory failure requiring rescue meds   Ciprofloxacin     Tendon problems    Flonase [Fluticasone]     Nasal spray cause nose bleeds   Ibuprofen     Gets mouth ulcers if she takes for more than 3 days; Aleve okay    Nitrofurantoin Monohyd Macro Hives   Praluent [Alirocumab]     Joint pain   Pravastatin     Muscle aches, rash, constipation on 20mg  every other day dosing   Rosuvastatin     Muscle and joint pain on 10mg  daily dosing   Scopolamine     "Headache and redness of eyes"    Current Outpatient Medications  Medication Sig Dispense Refill   Calcium Carb-Cholecalciferol 600-10 MG-MCG TABS Take by mouth.     levothyroxine (SYNTHROID) 150 MCG tablet Take 150 mcg by mouth once a week. ON SUNDAYS     loratadine-pseudoephedrine (CLARITIN-D 24-HOUR) 10-240 MG 24 hr tablet Take by mouth.     ondansetron (ZOFRAN) 8 MG tablet Take by mouth.     SYNTHROID 100 MCG tablet Take 100 mcg by mouth every morning.     Current Facility-Administered Medications  Medication Dose Route Frequency Provider Last Rate Last  Admin   0.9 %  sodium chloride infusion   Intravenous PRN Janetta Hora, PA-C        OBJECTIVE: White woman in no acute distress  Vitals:   02/18/23 0915  BP: 99/75  Pulse: 91  Resp: 19  Temp: 98.1 F (36.7 C)  SpO2: 97%        Body mass index is 35.05 kg/m.    ECOG FS:1 - Symptomatic but completely ambulatory  Physical Exam Constitutional:      Appearance: Normal appearance.  Cardiovascular:     Rate and Rhythm: Normal rate and regular rhythm.  Pulmonary:     Effort: Pulmonary effort is normal.     Breath sounds: Normal breath sounds.  Abdominal:     General: Abdomen is flat.     Palpations: Abdomen is soft.  Musculoskeletal:     Cervical back: Normal range of motion and neck supple. No rigidity.  Lymphadenopathy:     Cervical: No cervical adenopathy.     Upper Body:     Left upper body: Axillary adenopathy present.  Skin:    General: Skin is warm and dry.  Neurological:     General: No focal deficit present.     Mental Status: She is alert.    RESULTS: MM 3D SCREENING MAMMOGRAM BILATERAL BREAST  Result Date: 02/10/2023 CLINICAL DATA:  Screening. Personal history of malignant RIGHT breast lumpectomy in 2016 and 2005. EXAM: DIGITAL SCREENING BILATERAL MAMMOGRAM WITH TOMOSYNTHESIS AND CAD TECHNIQUE: Bilateral screening digital craniocaudal and mediolateral oblique mammograms were obtained. Bilateral screening digital breast tomosynthesis was performed. The images were evaluated with computer-aided detection. COMPARISON:  Previous exam(s). ACR Breast Density Category b: There are scattered areas of fibroglandular density. FINDINGS: There are no findings suspicious for malignancy. Expected post lumpectomy changes involving the RIGHT breast. IMPRESSION: No mammographic evidence of malignancy. A result letter of this screening mammogram will be mailed directly to  the patient. RECOMMENDATION: Screening mammogram in one year. (Code:SM-B-01Y) BI-RADS CATEGORY  2: Benign.  Electronically Signed   By: Hulan Saas M.D.   On: 02/10/2023 14:09     CMP     Component Value Date/Time   NA 140 07/16/2022 1600   NA 141 12/30/2021 1137   K 4.5 07/16/2022 1600   CL 105 07/16/2022 1600   CO2 28 07/16/2022 1600   GLUCOSE 79 07/16/2022 1600   BUN 17 07/16/2022 1600   BUN 16 12/30/2021 1137   CREATININE 1.06 (H) 07/16/2022 1600   CALCIUM 9.8 07/16/2022 1600   PROT 6.7 07/16/2022 1600   PROT 6.3 12/30/2021 1137   ALBUMIN 4.2 07/16/2022 1600   ALBUMIN 4.5 12/30/2021 1137   AST 16 07/16/2022 1600   ALT 16 07/16/2022 1600   ALKPHOS 67 07/16/2022 1600   BILITOT 1.2 07/16/2022 1600   GFRNONAA 54 (L) 07/16/2022 1600   GFRAA 51 (L) 03/01/2020 1447   GFRAA 47 (L) 01/26/2020 1220    INo results found for: "SPEP", "UPEP"  Lab Results  Component Value Date   WBC 3.4 (L) 07/16/2022   NEUTROABS 1.7 07/16/2022   HGB 14.0 07/16/2022   HCT 39.4 07/16/2022   MCV 88.7 07/16/2022   PLT 111 (L) 07/16/2022      Chemistry      Component Value Date/Time   NA 140 07/16/2022 1600   NA 141 12/30/2021 1137   K 4.5 07/16/2022 1600   CL 105 07/16/2022 1600   CO2 28 07/16/2022 1600   BUN 17 07/16/2022 1600   BUN 16 12/30/2021 1137   CREATININE 1.06 (H) 07/16/2022 1600      Component Value Date/Time   CALCIUM 9.8 07/16/2022 1600   ALKPHOS 67 07/16/2022 1600   AST 16 07/16/2022 1600   ALT 16 07/16/2022 1600   BILITOT 1.2 07/16/2022 1600       Lab Results  Component Value Date   LABCA2 22 12/04/2008    No components found for: "LABCA125"  No results for input(s): "INR" in the last 168 hours.  Urinalysis    Component Value Date/Time   COLORURINE YELLOW 05/20/2015 1120   APPEARANCEUR Clear 07/14/2022 1326   LABSPEC 1.009 05/20/2015 1120   PHURINE 6.5 05/20/2015 1120   GLUCOSEU Negative 07/14/2022 1326   HGBUR NEGATIVE 05/20/2015 1120   BILIRUBINUR Negative 07/14/2022 1326   KETONESUR NEGATIVE 05/20/2015 1120   PROTEINUR Negative 07/14/2022 1326    PROTEINUR NEGATIVE 05/20/2015 1120   UROBILINOGEN 0.2 12/19/2014 1427   NITRITE Negative 07/14/2022 1326   NITRITE NEGATIVE 05/20/2015 1120   LEUKOCYTESUR Negative 07/14/2022 1326    STUDIES: MM 3D SCREENING MAMMOGRAM BILATERAL BREAST  Result Date: 02/10/2023 CLINICAL DATA:  Screening. Personal history of malignant RIGHT breast lumpectomy in 2016 and 2005. EXAM: DIGITAL SCREENING BILATERAL MAMMOGRAM WITH TOMOSYNTHESIS AND CAD TECHNIQUE: Bilateral screening digital craniocaudal and mediolateral oblique mammograms were obtained. Bilateral screening digital breast tomosynthesis was performed. The images were evaluated with computer-aided detection. COMPARISON:  Previous exam(s). ACR Breast Density Category b: There are scattered areas of fibroglandular density. FINDINGS: There are no findings suspicious for malignancy. Expected post lumpectomy changes involving the RIGHT breast. IMPRESSION: No mammographic evidence of malignancy. A result letter of this screening mammogram will be mailed directly to the patient. RECOMMENDATION: Screening mammogram in one year. (Code:SM-B-01Y) BI-RADS CATEGORY  2: Benign. Electronically Signed   By: Hulan Saas M.D.   On: 02/10/2023 14:09     ASSESSMENT/PLAN  # 1 Right breast  IDC cancer: 77 y.o. 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  She also remembers trying letrozole which she once again caused her severe arthralgias.  She had genetic testing back in 2016 and it was repeated in 2022 with no deleterious mutations there was VUS identified in ALK gene.  Last mammogram in August 2024 with no evidence of malignancy.  No concerns on physical exam today  #  2 Nodal marginal zone lymphoma.  She was treated at [facility] with 3 cycles of R-CHOP and interim PET which showed complete response and she had 3 more cycles of R-CVP.  She had end of treatment PET which showed complete response.  She tolerated it well overall.  She will now continue on Rituxan maintenance at [facility].  I have offered her to do Rituxan locally if she is interested.  She will keep [place] posted.  There is no evidence of lymphoma on clinical exam today.  #3 1 cm exophytic lesion from the lower pole of left kidney, followed up with urology and they recommended surveillance.  #4 Right upper lobe lung nodule, this has resolved.  Total time spent: 40 minutes  *Total Encounter Time as defined by the Centers for Medicare and Medicaid Services includes, in addition to the face-to-face time of a patient visit (documented in the note above) non-face-to-face time: obtaining and reviewing outside history, ordering and reviewing medications, tests or procedures, care coordination (communications with other health care professionals or caregivers) and documentation in the medical record.

## 2023-02-22 DIAGNOSIS — H33103 Unspecified retinoschisis, bilateral: Secondary | ICD-10-CM | POA: Diagnosis not present

## 2023-02-22 DIAGNOSIS — H35033 Hypertensive retinopathy, bilateral: Secondary | ICD-10-CM | POA: Diagnosis not present

## 2023-02-22 DIAGNOSIS — D3192 Benign neoplasm of unspecified part of left eye: Secondary | ICD-10-CM | POA: Diagnosis not present

## 2023-02-22 DIAGNOSIS — H35363 Drusen (degenerative) of macula, bilateral: Secondary | ICD-10-CM | POA: Diagnosis not present

## 2023-02-22 DIAGNOSIS — D1801 Hemangioma of skin and subcutaneous tissue: Secondary | ICD-10-CM | POA: Diagnosis not present

## 2023-02-25 DIAGNOSIS — Z23 Encounter for immunization: Secondary | ICD-10-CM | POA: Diagnosis not present

## 2023-02-25 DIAGNOSIS — C8580 Other specified types of non-Hodgkin lymphoma, unspecified site: Secondary | ICD-10-CM | POA: Diagnosis not present

## 2023-02-25 DIAGNOSIS — C8300 Small cell B-cell lymphoma, unspecified site: Secondary | ICD-10-CM | POA: Diagnosis not present

## 2023-03-09 ENCOUNTER — Encounter: Payer: Self-pay | Admitting: Internal Medicine

## 2023-03-09 ENCOUNTER — Telehealth: Payer: Self-pay | Admitting: Internal Medicine

## 2023-03-10 NOTE — Telephone Encounter (Signed)
FYI only.

## 2023-03-11 ENCOUNTER — Ambulatory Visit: Payer: Medicare Other | Admitting: Internal Medicine

## 2023-03-15 NOTE — Telephone Encounter (Signed)
Agree with patient

## 2023-03-16 NOTE — Telephone Encounter (Signed)
Pt aware.

## 2023-03-17 ENCOUNTER — Other Ambulatory Visit (HOSPITAL_COMMUNITY): Payer: Medicare Other

## 2023-03-19 ENCOUNTER — Encounter: Payer: Self-pay | Admitting: Internal Medicine

## 2023-03-19 ENCOUNTER — Ambulatory Visit (INDEPENDENT_AMBULATORY_CARE_PROVIDER_SITE_OTHER): Payer: Medicare Other | Admitting: Internal Medicine

## 2023-03-19 VITALS — BP 108/64 | HR 95 | Ht 64.0 in | Wt 202.0 lb

## 2023-03-19 DIAGNOSIS — Z853 Personal history of malignant neoplasm of breast: Secondary | ICD-10-CM | POA: Diagnosis not present

## 2023-03-19 DIAGNOSIS — Z836 Family history of other diseases of the respiratory system: Secondary | ICD-10-CM

## 2023-03-19 DIAGNOSIS — R911 Solitary pulmonary nodule: Secondary | ICD-10-CM

## 2023-03-19 DIAGNOSIS — R591 Generalized enlarged lymph nodes: Secondary | ICD-10-CM

## 2023-03-19 NOTE — Patient Instructions (Addendum)
Axillary lymphadenopathy Lymphadenopathy Granulomatous adenopathy - right inguinal node sep 2021 and pre-granuloma of left axilla Sept 2022 History of breast cancer  -There is enlargement of the left axillary lymph node in Dec 2023 . All of this resolved afer diagnosis of NMZL and treatment at [facility]  Per [facility]   Pulmonary nodules - multiple   -CT scan of the chest  DEc 2023 - RUL 0.55cm new \- RUL post segment is resolved -  LUL post segment small  <0.4cm stable   - none reported on [facility] Setp 2024 Plan - per [facility]  Family history of pulmonary fibrosis  CT scan of the chest Dec 2023 and sept 2024 shows no evidence of pulmonary fibrosis -Genetic testing for pulmonary fibrosis is noncontributory in January 2022 -Glad you are feeling well without any shortness of breath or cough  Plan -Expectant follow-up     Follow-up - as needed

## 2023-03-19 NOTE — Telephone Encounter (Signed)
Addressed at OV 03/19/2023 She is discharged from followup

## 2023-03-19 NOTE — Progress Notes (Signed)
Unchanged spiculated nodule in the anterior left upper lobe measuring 5 mm, likely benign given established stability. 3. There are multiple new scattered small pulmonary nodules, measuring 5 mm or smaller. These are nonspecific and statistically most likely infectious or inflammatory, although given history of malignancy, metastatic disease is not excluded. 4. Interval enlargement of left axillary, subpectoral, celiac axis, and retroperitoneal lymph nodes, which remain generally concerning for lymphoma or metastatic disease. 5. Mild,  lobular air trapping on expiratory phase imaging, consistent with small airways disease. 6. Enlargement of the main pulmonary artery, as can be seen in pulmonary hypertension. 7. Coronary artery disease. Electronically Signed   By: Lauralyn Primes M.D.   On: 08/02/2020 15:25      PFT  OV 01/31/2021  Subjective:  Patient ID: Elizabeth Ramirez, female , DOB: 08/07/1945 , age 77 y.o. , MRN: [id number] , ADDRESS: [address] [place] [address] PCP Gweneth Dimitri, MD Patient Care Team: Gweneth Dimitri, MD as PCP - General (Family Medicine) Magrinat, Valentino Hue, MD as Consulting Physician (Oncology) Glenna Fellows, MD (Inactive) as Consulting Physician (General Surgery) Carrington Clamp, MD as Consulting Physician (Obstetrics and Gynecology) Francena Hanly, MD as Consulting Physician (Orthopedic Surgery) Emelia Loron, MD as Consulting Physician (General Surgery)  This Provider for this visit: Treatment Team:  Attending Provider: Kalman Shan, MD    01/31/2021 -   Chief Complaint  Patient presents with   Follow-up    Doing well, no issues     HPI Elizabeth Ramirez 77 y.o. -p presents for follow-up.  And CT scan of the chest in February 2022 showed resolution of the left lower lobe lymph node from 2021 August but she had numerous other multiple tiny nodules including left upper lobe lymph nodes.  We ordered a CT scan of the chest today but the results are pending.  Upon my personal visualization it looks okay.    Of note she did have an enlarged left axillary lymph node and I sent her to surgeon .  I reviewed the record.  And once end of June 2022 she had a needle biopsy that showed abnormal T cells.  She is now scheduled for excision biopsy on 02/18/2021.  In the interim she will see Dr. Darnelle Catalan her oncologist.  Also referred her to cardiology for coronary artery calcification but I do not know if she is seeing cardiology.  She is denying any chest pain or shortness of  breath and overall she is reporting good health.  No results found.    Background history from the oncologist -in September 2021   (1) status post right lumpectomy April 2005 for a pT1a pN0, stage IA invasive breast cancer, estrogen and progesterone receptor positive, HER-2 negative, in the setting of DCIS;              (a) did not receive radiation             (b) status post anastrozole for 5 years completed June 2010   (2) status post right breast upper outer quadrant biopsy 12/21/2014 for ductal carcinoma in situ, grade 2 or 3, estrogen and progesterone receptor positive.   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   Right breast boost/ 7.5 Gy at 2.5 Gy per fraction x 3 fractions   (  PET scan. Correlate with side of vaccine administration. Metastatic disease is also considered though would be unusual in the setting of breast cancer with pelvic node predominant disease and splenic enlargement with mild hypermetabolism. 2. Small lymph nodes in the neck and potential RIGHT parotid lesion, this  could be related but would suggest dedicated assessment of the parotid gland for further evaluation, this could be performed with CT or ultrasound as an initial means in further evaluating this area. 3. Small lung nodule in the LEFT lower lobe is nonspecific in terms of FDG uptake suggesting indolent process. Morphologic features raising the question of bronchogenic neoplasm as outlined previously. 4. Mild increased FDG uptake in the RIGHT breast may relate to recent biopsy at the site of previous surgery, refer to biopsy results and mammography for further information. 5. Lobular hepatic contours could also be seen in the setting of liver disease, correlate with any clinical or laboratory evidence of liver disease. Liver disease could be an alternative explanation for splenic enlargement. 6. Activation of brown fat along the spine and potentially in the area of the occipital region and posterior neck.     Electronically Signed   By: Donzetta Kohut M.D.   On: 02/08/2020 16:59   A. LYMPH NODE, RIGHT INGUINAL, BIOPSY: February 23, 2020 -  Lymph node with follicular hyperplasia, scattered giant cells and  epithelioid granulomas  -  No morphologic or immunophenotypic evidence of lymphoma  -  See comment   Famil hx Sanders father died at the age of 36 from pulmonary fibrosis.  A paternal aunt also had pulmonary fibrosis.  A paternal cousin also has pulmonary fibrosis.  Her mother died at age 70 with severe dementia. The patient had one brother no sisters. Her maternal grandmother died from stomach cancer the age of 31. The patient's mother had a half sister with ovarian cancer.    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 At home it's her and Clerance Lav , who also is in real estate, with her own investment business..    GENETIC clinic 07/24/20  GENETIC TEST RESULTS: Genetic testing  reported out on 07/23/2020 through the [facility] Multi-Cancer Panel + Pulmonary Fibrosis panel found no pathogenic mutations.    The Multi-Cancer Panel + Pulmonary Fibrosis panel offered by [facility] includes sequencing and/or deletion duplication testing of the following 97 genes: ABCA3, ACD, AIP, ALK, APC*, ATM*, AXIN2, BAP1, BARD1, BLM, BMPR1A, BRCA1, BRCA2, BRIP1, CASR, CDC73, CDH1, CDK4, CDKN1B, CDKN1C, CDKN2A (p14ARF), CDKN2A (p16INK4a), CEBPA, CHEK2, CTC1, CTNNA1, DICER1*, DIS3L2*, DKC1, EGFR, EPCAM*, FH*, FLCN, GATA2, GPC3*, GREM1*, HOXB13, HRAS, KIT, MAX*, MEN1*, MET*, MITF, MLH1*, MSH2*, MSH3*, MSH6*, MUTYH, NAF1, NBN, NF1*, NF2, NHP2, NOP10, NTHL1, PALB2, PARN, PDGFRA, PHOX2B*, PMS2*, POLD1*, POLE, POT1, PRKAR1A, PTCH1, PTEN*, RAD50, RAD51C, RAD51D, RB1*, RECQL4*, RET, RTEL1, RUNX1, SDHA*, SDHAF2, SDHB, SDHC*, SDHD, SFTPC, SMAD4, SMARCA4, SMARCB1, SMARCE1, STK11, SUFU, TERC, TERT, TINF2, TMEM127, TP53, TSC1*, TSC2, USB1, VHL, WRAP53, WRN*, WT1    OV 08/06/2020  Subjective:  Patient ID: Elizabeth Ramirez, female , DOB: [birth year] , age 73 y.o. , MRN: [id number] , ADDRESS: [address] [place] [address] PCP Gweneth Dimitri, MD Patient Care Team: Gweneth Dimitri, MD as PCP - General (Family Medicine) Magrinat, Valentino Hue, MD as Consulting Physician (Oncology) Glenna Fellows, MD (Inactive) as Consulting Physician (General Surgery) Carrington Clamp, MD as Consulting Physician (Obstetrics and Gynecology) Francena Hanly, MD as Consulting  Unchanged spiculated nodule in the anterior left upper lobe measuring 5 mm, likely benign given established stability. 3. There are multiple new scattered small pulmonary nodules, measuring 5 mm or smaller. These are nonspecific and statistically most likely infectious or inflammatory, although given history of malignancy, metastatic disease is not excluded. 4. Interval enlargement of left axillary, subpectoral, celiac axis, and retroperitoneal lymph nodes, which remain generally concerning for lymphoma or metastatic disease. 5. Mild,  lobular air trapping on expiratory phase imaging, consistent with small airways disease. 6. Enlargement of the main pulmonary artery, as can be seen in pulmonary hypertension. 7. Coronary artery disease. Electronically Signed   By: Lauralyn Primes M.D.   On: 08/02/2020 15:25      PFT  OV 01/31/2021  Subjective:  Patient ID: Elizabeth Ramirez, female , DOB: 08/07/1945 , age 77 y.o. , MRN: [id number] , ADDRESS: [address] [place] [address] PCP Gweneth Dimitri, MD Patient Care Team: Gweneth Dimitri, MD as PCP - General (Family Medicine) Magrinat, Valentino Hue, MD as Consulting Physician (Oncology) Glenna Fellows, MD (Inactive) as Consulting Physician (General Surgery) Carrington Clamp, MD as Consulting Physician (Obstetrics and Gynecology) Francena Hanly, MD as Consulting Physician (Orthopedic Surgery) Emelia Loron, MD as Consulting Physician (General Surgery)  This Provider for this visit: Treatment Team:  Attending Provider: Kalman Shan, MD    01/31/2021 -   Chief Complaint  Patient presents with   Follow-up    Doing well, no issues     HPI Elizabeth Ramirez 77 y.o. -p presents for follow-up.  And CT scan of the chest in February 2022 showed resolution of the left lower lobe lymph node from 2021 August but she had numerous other multiple tiny nodules including left upper lobe lymph nodes.  We ordered a CT scan of the chest today but the results are pending.  Upon my personal visualization it looks okay.    Of note she did have an enlarged left axillary lymph node and I sent her to surgeon .  I reviewed the record.  And once end of June 2022 she had a needle biopsy that showed abnormal T cells.  She is now scheduled for excision biopsy on 02/18/2021.  In the interim she will see Dr. Darnelle Catalan her oncologist.  Also referred her to cardiology for coronary artery calcification but I do not know if she is seeing cardiology.  She is denying any chest pain or shortness of  breath and overall she is reporting good health.  No results found.    Background history from the oncologist -in September 2021   (1) status post right lumpectomy April 2005 for a pT1a pN0, stage IA invasive breast cancer, estrogen and progesterone receptor positive, HER-2 negative, in the setting of DCIS;              (a) did not receive radiation             (b) status post anastrozole for 5 years completed June 2010   (2) status post right breast upper outer quadrant biopsy 12/21/2014 for ductal carcinoma in situ, grade 2 or 3, estrogen and progesterone receptor positive.   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   Right breast boost/ 7.5 Gy at 2.5 Gy per fraction x 3 fractions   (  Unchanged spiculated nodule in the anterior left upper lobe measuring 5 mm, likely benign given established stability. 3. There are multiple new scattered small pulmonary nodules, measuring 5 mm or smaller. These are nonspecific and statistically most likely infectious or inflammatory, although given history of malignancy, metastatic disease is not excluded. 4. Interval enlargement of left axillary, subpectoral, celiac axis, and retroperitoneal lymph nodes, which remain generally concerning for lymphoma or metastatic disease. 5. Mild,  lobular air trapping on expiratory phase imaging, consistent with small airways disease. 6. Enlargement of the main pulmonary artery, as can be seen in pulmonary hypertension. 7. Coronary artery disease. Electronically Signed   By: Lauralyn Primes M.D.   On: 08/02/2020 15:25      PFT  OV 01/31/2021  Subjective:  Patient ID: Elizabeth Ramirez, female , DOB: 08/07/1945 , age 77 y.o. , MRN: [id number] , ADDRESS: [address] [place] [address] PCP Gweneth Dimitri, MD Patient Care Team: Gweneth Dimitri, MD as PCP - General (Family Medicine) Magrinat, Valentino Hue, MD as Consulting Physician (Oncology) Glenna Fellows, MD (Inactive) as Consulting Physician (General Surgery) Carrington Clamp, MD as Consulting Physician (Obstetrics and Gynecology) Francena Hanly, MD as Consulting Physician (Orthopedic Surgery) Emelia Loron, MD as Consulting Physician (General Surgery)  This Provider for this visit: Treatment Team:  Attending Provider: Kalman Shan, MD    01/31/2021 -   Chief Complaint  Patient presents with   Follow-up    Doing well, no issues     HPI Elizabeth Ramirez 77 y.o. -p presents for follow-up.  And CT scan of the chest in February 2022 showed resolution of the left lower lobe lymph node from 2021 August but she had numerous other multiple tiny nodules including left upper lobe lymph nodes.  We ordered a CT scan of the chest today but the results are pending.  Upon my personal visualization it looks okay.    Of note she did have an enlarged left axillary lymph node and I sent her to surgeon .  I reviewed the record.  And once end of June 2022 she had a needle biopsy that showed abnormal T cells.  She is now scheduled for excision biopsy on 02/18/2021.  In the interim she will see Dr. Darnelle Catalan her oncologist.  Also referred her to cardiology for coronary artery calcification but I do not know if she is seeing cardiology.  She is denying any chest pain or shortness of  breath and overall she is reporting good health.  No results found.    Background history from the oncologist -in September 2021   (1) status post right lumpectomy April 2005 for a pT1a pN0, stage IA invasive breast cancer, estrogen and progesterone receptor positive, HER-2 negative, in the setting of DCIS;              (a) did not receive radiation             (b) status post anastrozole for 5 years completed June 2010   (2) status post right breast upper outer quadrant biopsy 12/21/2014 for ductal carcinoma in situ, grade 2 or 3, estrogen and progesterone receptor positive.   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   Right breast boost/ 7.5 Gy at 2.5 Gy per fraction x 3 fractions   (  Normal heart size. Left coronary artery calcifications. No pericardial effusion.   Mediastinum/Nodes: Significant interval enlargement of a left axillary lymph node measuring 3.2 x 1.5 cm, previously 1.8 x 0.7 cm (series 2, image 42). Additional surgical clips and postoperative scarring in the left axilla. Thyroid gland, trachea, and esophagus demonstrate no significant findings.   Lungs/Pleura: New pulmonary nodule of the dependent right lower lobe measuring 0.5 cm (series 5, image 99). Additional new nodule of the deep right costophrenic recess measuring 0.6 cm (series 5, image 130). A previously seen new nodule of the posterior right upper lobe is resolved (series 5, image 34). Small pulmonary nodules of the posterior left upper lobe are not significantly changed, measuring up to 0.4 cm (series 5, image 52). Unchanged mild scarring of the bilateral lung bases. No pleural effusion or pneumothorax.   Upper Abdomen: No acute abnormality.   Musculoskeletal: Postoperative findings of right lumpectomy. No acute osseous findings. Status post left shoulder arthroplasty.   IMPRESSION: 1. Significant interval enlargement of a left axillary lymph node, consistent with recurrent nodal metastatic disease. Additional surgical clips and postoperative scarring in the left axilla. 2. New pulmonary nodules of the dependent right lower lobe measuring 0.5 cm and 0.6 cm. A previously seen new nodule of the posterior right upper lobe is resolved. Small pulmonary nodules of the posterior left upper lobe are not significantly changed, measuring up to 0.4 cm. Although as previously reported, these small nodules are nonspecific and possibly infectious or inflammatory, given history of breast cancer, and especially given clear evidence of axillary nodal metastatic disease, these are highly suspicious for small metastases. Given small size and dependent location, new nodules in the right lung base are not  likely amenable to FDG PET characterization for metabolic activity. 3. Coronary artery disease.   These results will be called to the ordering clinician or representative by the Radiologist Assistant, and communication documented in the PACS or [employer].   Aortic Atherosclerosis (ICD10-I70.0).     Electronically Signed   By: Jearld Lesch M.D.   On: 05/25/2022 15:46        OV 03/19/2023  Subjective:  Patient ID: Elizabeth Ramirez, female , DOB: January 24, 1946 , age 27 y.o. , MRN: [id number] , ADDRESS: [address] [place] [address] PCP Gweneth Dimitri, MD Patient Care Team: Gweneth Dimitri, MD as PCP - General (Family Medicine) Nahser, Deloris Ping, MD as PCP - Cardiology (Cardiology) Glenna Fellows, MD (Inactive) as Consulting Physician (General Surgery) Carrington Clamp, MD as Consulting Physician (Obstetrics and Gynecology) Francena Hanly, MD as Consulting Physician (Orthopedic Surgery) Emelia Loron, MD as Consulting Physician (General Surgery) Rachel Moulds, MD as Consulting Physician (Hematology and Oncology)  This Provider for this visit: Treatment Team:  Attending Provider: Kalman Shan, MD  Multiple pulmonary nodules in the setting of breast cancer and also family history of pulmonary fibrosis and also right inguinal lymphadenopathy September 2021 there is granuloma and left axillary node pre granuloma September 2022  03/19/2023 -follow-up axillary lymphadenopathy and also family history of interstitial lung disease.   HPI Elizabeth Ramirez 77 y.o. -when I saw her last she had new onset left axillary lymphadenopathy.  There was concerns for malignancy.  At that point I alerted her oncologist.  She states since then she had the axillary lymph node biopsy which ended up being nondiagnostic.  Later she seemed to have a recurrence in the local area which resulted in another biopsy.  Then subsequently diagnosed with an MCL type of  PET scan. Correlate with side of vaccine administration. Metastatic disease is also considered though would be unusual in the setting of breast cancer with pelvic node predominant disease and splenic enlargement with mild hypermetabolism. 2. Small lymph nodes in the neck and potential RIGHT parotid lesion, this  could be related but would suggest dedicated assessment of the parotid gland for further evaluation, this could be performed with CT or ultrasound as an initial means in further evaluating this area. 3. Small lung nodule in the LEFT lower lobe is nonspecific in terms of FDG uptake suggesting indolent process. Morphologic features raising the question of bronchogenic neoplasm as outlined previously. 4. Mild increased FDG uptake in the RIGHT breast may relate to recent biopsy at the site of previous surgery, refer to biopsy results and mammography for further information. 5. Lobular hepatic contours could also be seen in the setting of liver disease, correlate with any clinical or laboratory evidence of liver disease. Liver disease could be an alternative explanation for splenic enlargement. 6. Activation of brown fat along the spine and potentially in the area of the occipital region and posterior neck.     Electronically Signed   By: Donzetta Kohut M.D.   On: 02/08/2020 16:59   A. LYMPH NODE, RIGHT INGUINAL, BIOPSY: February 23, 2020 -  Lymph node with follicular hyperplasia, scattered giant cells and  epithelioid granulomas  -  No morphologic or immunophenotypic evidence of lymphoma  -  See comment   Famil hx Sanders father died at the age of 36 from pulmonary fibrosis.  A paternal aunt also had pulmonary fibrosis.  A paternal cousin also has pulmonary fibrosis.  Her mother died at age 70 with severe dementia. The patient had one brother no sisters. Her maternal grandmother died from stomach cancer the age of 31. The patient's mother had a half sister with ovarian cancer.    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 At home it's her and Clerance Lav , who also is in real estate, with her own investment business..    GENETIC clinic 07/24/20  GENETIC TEST RESULTS: Genetic testing  reported out on 07/23/2020 through the [facility] Multi-Cancer Panel + Pulmonary Fibrosis panel found no pathogenic mutations.    The Multi-Cancer Panel + Pulmonary Fibrosis panel offered by [facility] includes sequencing and/or deletion duplication testing of the following 97 genes: ABCA3, ACD, AIP, ALK, APC*, ATM*, AXIN2, BAP1, BARD1, BLM, BMPR1A, BRCA1, BRCA2, BRIP1, CASR, CDC73, CDH1, CDK4, CDKN1B, CDKN1C, CDKN2A (p14ARF), CDKN2A (p16INK4a), CEBPA, CHEK2, CTC1, CTNNA1, DICER1*, DIS3L2*, DKC1, EGFR, EPCAM*, FH*, FLCN, GATA2, GPC3*, GREM1*, HOXB13, HRAS, KIT, MAX*, MEN1*, MET*, MITF, MLH1*, MSH2*, MSH3*, MSH6*, MUTYH, NAF1, NBN, NF1*, NF2, NHP2, NOP10, NTHL1, PALB2, PARN, PDGFRA, PHOX2B*, PMS2*, POLD1*, POLE, POT1, PRKAR1A, PTCH1, PTEN*, RAD50, RAD51C, RAD51D, RB1*, RECQL4*, RET, RTEL1, RUNX1, SDHA*, SDHAF2, SDHB, SDHC*, SDHD, SFTPC, SMAD4, SMARCA4, SMARCB1, SMARCE1, STK11, SUFU, TERC, TERT, TINF2, TMEM127, TP53, TSC1*, TSC2, USB1, VHL, WRAP53, WRN*, WT1    OV 08/06/2020  Subjective:  Patient ID: Elizabeth Ramirez, female , DOB: [birth year] , age 73 y.o. , MRN: [id number] , ADDRESS: [address] [place] [address] PCP Gweneth Dimitri, MD Patient Care Team: Gweneth Dimitri, MD as PCP - General (Family Medicine) Magrinat, Valentino Hue, MD as Consulting Physician (Oncology) Glenna Fellows, MD (Inactive) as Consulting Physician (General Surgery) Carrington Clamp, MD as Consulting Physician (Obstetrics and Gynecology) Francena Hanly, MD as Consulting  CD10 and  BCL6 2 were performed on a representative block and show that the  lymphoid follicles are positive for CD20 and CD10 and negative for  Bcl-2.  The lymphoid follicles are surrounded by abundant T cells as  seen with CD3.  The overall features are consistent with florid reactive  lymphoid hyperplasia. There is no evidence to suggest a  lymphoproliferative process.  The presence of scattered clusters of  epithelioid histiocytes in association with florid follicular  hyperplasia may be the result of previous biopsy/procedure, sarcoidosis,  or infection such as leishmaniasis, toxoplasmosis, etc.  Clinical and  serologic evaluation is recommended.   PFT  No flowsheet data found.     OV 05/29/2022  Subjective:  Patient ID: Elizabeth Ramirez, female , DOB: 1945/10/18 , age 70 y.o. , MRN: [id number] , ADDRESS: [address] [place] [address] PCP Gweneth Dimitri, MD Patient Care Team: Gweneth Dimitri, MD as PCP - General (Family Medicine) Nahser, Deloris Ping, MD as PCP - Cardiology (Cardiology) Magrinat, Valentino Hue, MD (Inactive) as Consulting Physician (Oncology) Glenna Fellows, MD (Inactive) as Consulting Physician (General Surgery) Carrington Clamp, MD as Consulting Physician (Obstetrics and Gynecology) Francena Hanly, MD as Consulting Physician (Orthopedic Surgery) Emelia Loron, MD as Consulting Physician (General Surgery)  This Provider for this visit: Treatment Team:  Attending Provider: Kalman Shan, MD  Multiple pulmonary nodules in the setting of breast cancer and also family history of pulmonary fibrosis and also right inguinal lymphadenopathy September 2021 there is  granuloma and left axillary node pre granuloma September 2022  05/29/2022 -   Chief Complaint  Patient presents with   Follow-up    Pt is here following up after recent CT.  Pt states she has been doing okay since last visit.     HPI Elizabeth Ramirez 77 y.o. -returns for follow-up.  She is asymptomatic from a respiratory standpoint.  In August 2023 she had a CT scan of the chest and there are some concern about her lung nodules a repeat CT scan of the chest has been done per radiology recommendation at the time 0.3-6 months which was completed December 2023.  In this December 2023 CT scan the left axillary lymph node is suddenly and rapidly enlarged.  Radiology is very concerned about metastatic disease.  In talking to her she is asymptomatic.  She got 4 different vaccines to her right shoulder with local reactions of rash each time week.  This was all in the fall 2023 between the August 2023 and December 2023 Scans.  She is wondering if this lymph node enlargement on the contralateral left side is in response to those injections on the right shoulder.  I told her we would keep an open mind on this it is quite possible.  I have alerted her oncologist was ordered a PET scan today.  The oncologist has agreed to follow-up on the axillary node issue.    CT Chest data 05/25/22   Narrative & Impression  CLINICAL DATA:  Follow-up lung nodules, sarcoidosis, history of breast cancer * Tracking Code: BO *   EXAM: CT CHEST WITHOUT CONTRAST   TECHNIQUE: Multidetector CT imaging of the chest was performed following the standard protocol without IV contrast.   RADIATION DOSE REDUCTION: This exam was performed according to the departmental dose-optimization program which includes automated exposure control, adjustment of the mA and/or kV according to patient size and/or use of iterative reconstruction technique.   COMPARISON:  02/04/2022   FINDINGS: Cardiovascular: Aortic atherosclerosis.  5) genetic testing 01/14/2015 through the 20-gene Breast/Ovarian Cancer Panel offered by [facility] Basilio Cairo, MD) showed no deleterious mutations in ATM, BARD1, BRCA1, BRCA2, BRIP1, CDH1, CHEK2, FANCC, MLH1, MSH2, MSH6, NBN, PALB2, PMS2, PTEN, RAD51C, RAD51D, TP53, and XRCC2.  This panel also includes deletion/duplication analysis (without sequencing) for one gene, EPCAM.    (6) resumed anastrozole 07/06/2014, discontinued March 2017 with arthralgias and myalgias developing              (a) started letrozole 10/14/2015--discontinued after a brief course secondary to side effects             (b) Bone density 03/29/2015 was normal, with the lowest T score at 0.4    Aiyanna is now just over 5 years out from definitive surgery for her breast cancer with no evidence of disease recurrence.  This is favorable.   We were preparing to discharge her from follow-up when she was found to have diffuse lymphadenopathy.  We obtained a lymph node biopsy which shows no evidence of breast  cancer or lymphoma.  There are granulomas but no evidence of tuberculosis or other chronic infection.  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  8) possible lymphoproliferative disease:               (A) continue CT obtained to follow nonspecific pulmonary nodules 01/26/2020 shows left axillary and left celiac adenopathy.             (B) PET scan 02/08/2020 shows generalized lymphadenopathy, which is hypermetabolic, together with splenic enlargement             (C) right inguinal lymph node biopsy 02/23/2020 showed follicular hyperplasia and epithelioid granulomas, no morphologic or immunophenotypic evidence of lymphoma.  This was read as most consistent with a reactive process without excluding sarcoidosis  (D) chest CT scan 08/02/2020 again shows left axillary subpectoral celiac and retroperitoneal adenopathy as well as multiple very small scattered pulmonary nodules             (E) left axillary lymph node biopsy 11/27/2020 shows an atypical lymphoid proliferation with no B-cell monoclonal component.   OV 05/23/2021  Subjective:  Patient ID: Elizabeth Ramirez, female , DOB: [birth year] , age 79 y.o. , MRN: [id number] , ADDRESS: [address] [place] [address] PCP Gweneth Dimitri, MD Patient Care Team: Gweneth Dimitri, MD as PCP - General (Family Medicine) Magrinat, Valentino Hue, MD as Consulting Physician (Oncology) Glenna Fellows, MD (Inactive) as Consulting Physician (General Surgery) Carrington Clamp, MD as Consulting Physician (Obstetrics and Gynecology) Francena Hanly, MD as Consulting Physician (Orthopedic Surgery) Emelia Loron, MD as Consulting Physician (General Surgery)  This Provider for this visit: Treatment Team:  Attending Provider: Kalman Shan, MD    05/23/2021 -   Chief Complaint  Patient presents with   Follow-up    Pt states she has been doing okay since last visit and denies any complaints.     HPI Elizabeth Ramirez 77 y.o.  -returns for follow-up.  Last seen in August 2022.  After that she had a CT scan of the chest and on the CT scan her left axillary lymph node had enlarged even further.  She also had interval development of new right upper lobe micro pulmonary nodules less than 5 mm.  Therefore she ended up getting left axillary lymph node biopsy.  The report is below.  She has lymphocyte proliferation with epithelioid cells.  Almost a pre-granuloma experience.  She did see infectious  Unchanged spiculated nodule in the anterior left upper lobe measuring 5 mm, likely benign given established stability. 3. There are multiple new scattered small pulmonary nodules, measuring 5 mm or smaller. These are nonspecific and statistically most likely infectious or inflammatory, although given history of malignancy, metastatic disease is not excluded. 4. Interval enlargement of left axillary, subpectoral, celiac axis, and retroperitoneal lymph nodes, which remain generally concerning for lymphoma or metastatic disease. 5. Mild,  lobular air trapping on expiratory phase imaging, consistent with small airways disease. 6. Enlargement of the main pulmonary artery, as can be seen in pulmonary hypertension. 7. Coronary artery disease. Electronically Signed   By: Lauralyn Primes M.D.   On: 08/02/2020 15:25      PFT  OV 01/31/2021  Subjective:  Patient ID: Elizabeth Ramirez, female , DOB: 08/07/1945 , age 77 y.o. , MRN: [id number] , ADDRESS: [address] [place] [address] PCP Gweneth Dimitri, MD Patient Care Team: Gweneth Dimitri, MD as PCP - General (Family Medicine) Magrinat, Valentino Hue, MD as Consulting Physician (Oncology) Glenna Fellows, MD (Inactive) as Consulting Physician (General Surgery) Carrington Clamp, MD as Consulting Physician (Obstetrics and Gynecology) Francena Hanly, MD as Consulting Physician (Orthopedic Surgery) Emelia Loron, MD as Consulting Physician (General Surgery)  This Provider for this visit: Treatment Team:  Attending Provider: Kalman Shan, MD    01/31/2021 -   Chief Complaint  Patient presents with   Follow-up    Doing well, no issues     HPI Elizabeth Ramirez 77 y.o. -p presents for follow-up.  And CT scan of the chest in February 2022 showed resolution of the left lower lobe lymph node from 2021 August but she had numerous other multiple tiny nodules including left upper lobe lymph nodes.  We ordered a CT scan of the chest today but the results are pending.  Upon my personal visualization it looks okay.    Of note she did have an enlarged left axillary lymph node and I sent her to surgeon .  I reviewed the record.  And once end of June 2022 she had a needle biopsy that showed abnormal T cells.  She is now scheduled for excision biopsy on 02/18/2021.  In the interim she will see Dr. Darnelle Catalan her oncologist.  Also referred her to cardiology for coronary artery calcification but I do not know if she is seeing cardiology.  She is denying any chest pain or shortness of  breath and overall she is reporting good health.  No results found.    Background history from the oncologist -in September 2021   (1) status post right lumpectomy April 2005 for a pT1a pN0, stage IA invasive breast cancer, estrogen and progesterone receptor positive, HER-2 negative, in the setting of DCIS;              (a) did not receive radiation             (b) status post anastrozole for 5 years completed June 2010   (2) status post right breast upper outer quadrant biopsy 12/21/2014 for ductal carcinoma in situ, grade 2 or 3, estrogen and progesterone receptor positive.   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   Right breast boost/ 7.5 Gy at 2.5 Gy per fraction x 3 fractions   (  5) genetic testing 01/14/2015 through the 20-gene Breast/Ovarian Cancer Panel offered by [facility] Basilio Cairo, MD) showed no deleterious mutations in ATM, BARD1, BRCA1, BRCA2, BRIP1, CDH1, CHEK2, FANCC, MLH1, MSH2, MSH6, NBN, PALB2, PMS2, PTEN, RAD51C, RAD51D, TP53, and XRCC2.  This panel also includes deletion/duplication analysis (without sequencing) for one gene, EPCAM.    (6) resumed anastrozole 07/06/2014, discontinued March 2017 with arthralgias and myalgias developing              (a) started letrozole 10/14/2015--discontinued after a brief course secondary to side effects             (b) Bone density 03/29/2015 was normal, with the lowest T score at 0.4    Aiyanna is now just over 5 years out from definitive surgery for her breast cancer with no evidence of disease recurrence.  This is favorable.   We were preparing to discharge her from follow-up when she was found to have diffuse lymphadenopathy.  We obtained a lymph node biopsy which shows no evidence of breast  cancer or lymphoma.  There are granulomas but no evidence of tuberculosis or other chronic infection.  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  8) possible lymphoproliferative disease:               (A) continue CT obtained to follow nonspecific pulmonary nodules 01/26/2020 shows left axillary and left celiac adenopathy.             (B) PET scan 02/08/2020 shows generalized lymphadenopathy, which is hypermetabolic, together with splenic enlargement             (C) right inguinal lymph node biopsy 02/23/2020 showed follicular hyperplasia and epithelioid granulomas, no morphologic or immunophenotypic evidence of lymphoma.  This was read as most consistent with a reactive process without excluding sarcoidosis  (D) chest CT scan 08/02/2020 again shows left axillary subpectoral celiac and retroperitoneal adenopathy as well as multiple very small scattered pulmonary nodules             (E) left axillary lymph node biopsy 11/27/2020 shows an atypical lymphoid proliferation with no B-cell monoclonal component.   OV 05/23/2021  Subjective:  Patient ID: Elizabeth Ramirez, female , DOB: [birth year] , age 79 y.o. , MRN: [id number] , ADDRESS: [address] [place] [address] PCP Gweneth Dimitri, MD Patient Care Team: Gweneth Dimitri, MD as PCP - General (Family Medicine) Magrinat, Valentino Hue, MD as Consulting Physician (Oncology) Glenna Fellows, MD (Inactive) as Consulting Physician (General Surgery) Carrington Clamp, MD as Consulting Physician (Obstetrics and Gynecology) Francena Hanly, MD as Consulting Physician (Orthopedic Surgery) Emelia Loron, MD as Consulting Physician (General Surgery)  This Provider for this visit: Treatment Team:  Attending Provider: Kalman Shan, MD    05/23/2021 -   Chief Complaint  Patient presents with   Follow-up    Pt states she has been doing okay since last visit and denies any complaints.     HPI Elizabeth Ramirez 77 y.o.  -returns for follow-up.  Last seen in August 2022.  After that she had a CT scan of the chest and on the CT scan her left axillary lymph node had enlarged even further.  She also had interval development of new right upper lobe micro pulmonary nodules less than 5 mm.  Therefore she ended up getting left axillary lymph node biopsy.  The report is below.  She has lymphocyte proliferation with epithelioid cells.  Almost a pre-granuloma experience.  She did see infectious  Unchanged spiculated nodule in the anterior left upper lobe measuring 5 mm, likely benign given established stability. 3. There are multiple new scattered small pulmonary nodules, measuring 5 mm or smaller. These are nonspecific and statistically most likely infectious or inflammatory, although given history of malignancy, metastatic disease is not excluded. 4. Interval enlargement of left axillary, subpectoral, celiac axis, and retroperitoneal lymph nodes, which remain generally concerning for lymphoma or metastatic disease. 5. Mild,  lobular air trapping on expiratory phase imaging, consistent with small airways disease. 6. Enlargement of the main pulmonary artery, as can be seen in pulmonary hypertension. 7. Coronary artery disease. Electronically Signed   By: Lauralyn Primes M.D.   On: 08/02/2020 15:25      PFT  OV 01/31/2021  Subjective:  Patient ID: Elizabeth Ramirez, female , DOB: 08/07/1945 , age 77 y.o. , MRN: [id number] , ADDRESS: [address] [place] [address] PCP Gweneth Dimitri, MD Patient Care Team: Gweneth Dimitri, MD as PCP - General (Family Medicine) Magrinat, Valentino Hue, MD as Consulting Physician (Oncology) Glenna Fellows, MD (Inactive) as Consulting Physician (General Surgery) Carrington Clamp, MD as Consulting Physician (Obstetrics and Gynecology) Francena Hanly, MD as Consulting Physician (Orthopedic Surgery) Emelia Loron, MD as Consulting Physician (General Surgery)  This Provider for this visit: Treatment Team:  Attending Provider: Kalman Shan, MD    01/31/2021 -   Chief Complaint  Patient presents with   Follow-up    Doing well, no issues     HPI Elizabeth Ramirez 77 y.o. -p presents for follow-up.  And CT scan of the chest in February 2022 showed resolution of the left lower lobe lymph node from 2021 August but she had numerous other multiple tiny nodules including left upper lobe lymph nodes.  We ordered a CT scan of the chest today but the results are pending.  Upon my personal visualization it looks okay.    Of note she did have an enlarged left axillary lymph node and I sent her to surgeon .  I reviewed the record.  And once end of June 2022 she had a needle biopsy that showed abnormal T cells.  She is now scheduled for excision biopsy on 02/18/2021.  In the interim she will see Dr. Darnelle Catalan her oncologist.  Also referred her to cardiology for coronary artery calcification but I do not know if she is seeing cardiology.  She is denying any chest pain or shortness of  breath and overall she is reporting good health.  No results found.    Background history from the oncologist -in September 2021   (1) status post right lumpectomy April 2005 for a pT1a pN0, stage IA invasive breast cancer, estrogen and progesterone receptor positive, HER-2 negative, in the setting of DCIS;              (a) did not receive radiation             (b) status post anastrozole for 5 years completed June 2010   (2) status post right breast upper outer quadrant biopsy 12/21/2014 for ductal carcinoma in situ, grade 2 or 3, estrogen and progesterone receptor positive.   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   Right breast boost/ 7.5 Gy at 2.5 Gy per fraction x 3 fractions   (  Normal heart size. Left coronary artery calcifications. No pericardial effusion.   Mediastinum/Nodes: Significant interval enlargement of a left axillary lymph node measuring 3.2 x 1.5 cm, previously 1.8 x 0.7 cm (series 2, image 42). Additional surgical clips and postoperative scarring in the left axilla. Thyroid gland, trachea, and esophagus demonstrate no significant findings.   Lungs/Pleura: New pulmonary nodule of the dependent right lower lobe measuring 0.5 cm (series 5, image 99). Additional new nodule of the deep right costophrenic recess measuring 0.6 cm (series 5, image 130). A previously seen new nodule of the posterior right upper lobe is resolved (series 5, image 34). Small pulmonary nodules of the posterior left upper lobe are not significantly changed, measuring up to 0.4 cm (series 5, image 52). Unchanged mild scarring of the bilateral lung bases. No pleural effusion or pneumothorax.   Upper Abdomen: No acute abnormality.   Musculoskeletal: Postoperative findings of right lumpectomy. No acute osseous findings. Status post left shoulder arthroplasty.   IMPRESSION: 1. Significant interval enlargement of a left axillary lymph node, consistent with recurrent nodal metastatic disease. Additional surgical clips and postoperative scarring in the left axilla. 2. New pulmonary nodules of the dependent right lower lobe measuring 0.5 cm and 0.6 cm. A previously seen new nodule of the posterior right upper lobe is resolved. Small pulmonary nodules of the posterior left upper lobe are not significantly changed, measuring up to 0.4 cm. Although as previously reported, these small nodules are nonspecific and possibly infectious or inflammatory, given history of breast cancer, and especially given clear evidence of axillary nodal metastatic disease, these are highly suspicious for small metastases. Given small size and dependent location, new nodules in the right lung base are not  likely amenable to FDG PET characterization for metabolic activity. 3. Coronary artery disease.   These results will be called to the ordering clinician or representative by the Radiologist Assistant, and communication documented in the PACS or [employer].   Aortic Atherosclerosis (ICD10-I70.0).     Electronically Signed   By: Jearld Lesch M.D.   On: 05/25/2022 15:46        OV 03/19/2023  Subjective:  Patient ID: Elizabeth Ramirez, female , DOB: January 24, 1946 , age 27 y.o. , MRN: [id number] , ADDRESS: [address] [place] [address] PCP Gweneth Dimitri, MD Patient Care Team: Gweneth Dimitri, MD as PCP - General (Family Medicine) Nahser, Deloris Ping, MD as PCP - Cardiology (Cardiology) Glenna Fellows, MD (Inactive) as Consulting Physician (General Surgery) Carrington Clamp, MD as Consulting Physician (Obstetrics and Gynecology) Francena Hanly, MD as Consulting Physician (Orthopedic Surgery) Emelia Loron, MD as Consulting Physician (General Surgery) Rachel Moulds, MD as Consulting Physician (Hematology and Oncology)  This Provider for this visit: Treatment Team:  Attending Provider: Kalman Shan, MD  Multiple pulmonary nodules in the setting of breast cancer and also family history of pulmonary fibrosis and also right inguinal lymphadenopathy September 2021 there is granuloma and left axillary node pre granuloma September 2022  03/19/2023 -follow-up axillary lymphadenopathy and also family history of interstitial lung disease.   HPI Elizabeth Ramirez 77 y.o. -when I saw her last she had new onset left axillary lymphadenopathy.  There was concerns for malignancy.  At that point I alerted her oncologist.  She states since then she had the axillary lymph node biopsy which ended up being nondiagnostic.  Later she seemed to have a recurrence in the local area which resulted in another biopsy.  Then subsequently diagnosed with an MCL type of

## 2023-03-29 DIAGNOSIS — D649 Anemia, unspecified: Secondary | ICD-10-CM | POA: Diagnosis not present

## 2023-04-12 DIAGNOSIS — Z23 Encounter for immunization: Secondary | ICD-10-CM | POA: Diagnosis not present

## 2023-04-22 ENCOUNTER — Ambulatory Visit
Admission: RE | Admit: 2023-04-22 | Discharge: 2023-04-22 | Disposition: A | Discharge status: Home / Self Care | Payer: Medicare Other | Source: Ambulatory Visit | Attending: Family Medicine | Admitting: Family Medicine

## 2023-04-22 DIAGNOSIS — R29890 Loss of height: Secondary | ICD-10-CM | POA: Diagnosis not present

## 2023-04-22 DIAGNOSIS — E2839 Other primary ovarian failure: Secondary | ICD-10-CM | POA: Diagnosis not present

## 2023-04-22 DIAGNOSIS — N958 Other specified menopausal and perimenopausal disorders: Secondary | ICD-10-CM | POA: Diagnosis not present

## 2023-04-30 DIAGNOSIS — Z803 Family history of malignant neoplasm of breast: Secondary | ICD-10-CM | POA: Diagnosis not present

## 2023-04-30 DIAGNOSIS — Z136 Encounter for screening for cardiovascular disorders: Secondary | ICD-10-CM | POA: Diagnosis not present

## 2023-05-03 DIAGNOSIS — N1831 Chronic kidney disease, stage 3a: Secondary | ICD-10-CM | POA: Diagnosis not present

## 2023-05-03 DIAGNOSIS — E039 Hypothyroidism, unspecified: Secondary | ICD-10-CM | POA: Diagnosis not present

## 2023-05-10 ENCOUNTER — Encounter: Payer: Self-pay | Admitting: Cardiovascular Disease

## 2023-05-10 DIAGNOSIS — E039 Hypothyroidism, unspecified: Secondary | ICD-10-CM | POA: Diagnosis not present

## 2023-05-10 DIAGNOSIS — Z1331 Encounter for screening for depression: Secondary | ICD-10-CM | POA: Diagnosis not present

## 2023-05-10 DIAGNOSIS — Z6837 Body mass index (BMI) 37.0-37.9, adult: Secondary | ICD-10-CM | POA: Diagnosis not present

## 2023-05-10 DIAGNOSIS — E782 Mixed hyperlipidemia: Secondary | ICD-10-CM | POA: Diagnosis not present

## 2023-05-10 DIAGNOSIS — N1832 Chronic kidney disease, stage 3b: Secondary | ICD-10-CM | POA: Diagnosis not present

## 2023-05-10 DIAGNOSIS — Z0000 Encounter for general adult medical examination without abnormal findings: Secondary | ICD-10-CM | POA: Diagnosis not present

## 2023-05-10 DIAGNOSIS — I2510 Atherosclerotic heart disease of native coronary artery without angina pectoris: Secondary | ICD-10-CM | POA: Diagnosis not present

## 2023-05-10 DIAGNOSIS — B009 Herpesviral infection, unspecified: Secondary | ICD-10-CM | POA: Diagnosis not present

## 2023-05-10 DIAGNOSIS — Z23 Encounter for immunization: Secondary | ICD-10-CM | POA: Diagnosis not present

## 2023-05-10 DIAGNOSIS — I700 Atherosclerosis of aorta: Secondary | ICD-10-CM | POA: Diagnosis not present

## 2023-05-10 DIAGNOSIS — C8598 Non-Hodgkin lymphoma, unspecified, lymph nodes of multiple sites: Secondary | ICD-10-CM | POA: Diagnosis not present

## 2023-05-28 DIAGNOSIS — C8300 Small cell B-cell lymphoma, unspecified site: Secondary | ICD-10-CM | POA: Diagnosis not present

## 2023-09-14 ENCOUNTER — Encounter: Payer: Self-pay | Admitting: Cardiovascular Disease

## 2023-10-15 LAB — LAB REPORT - SCANNED
Creatinine, POC: 86 mg/dL
EGFR (Non-African Amer.): 38

## 2023-11-11 ENCOUNTER — Ambulatory Visit: Payer: Self-pay | Admitting: Internal Medicine

## 2023-11-15 NOTE — Progress Notes (Signed)
 Cardiology Office Note:    Date:  11/16/2023   ID:  Elizabeth Ramirez, DOB 04/18/1946, MRN [id number]  PCP:  Helyn Lobstein, MD  Cardiologist:  Ahmad Alert, MD    Referring MD: Helyn Lobstein, MD   Chief Complaint  Patient presents with   Congestive Heart Failure         Previous notes.   Elizabeth Ramirez is a 78 y.o. female who is being seen today for the evaluation of heart murmur  at the request of Helyn Lobstein, MD.   [name] is a 78 y.o. female with a hx of hypothyroidism, breast cancer, obesity, right total knee replacement who we are asked to see today for evaluation of heart murmur.  Heart murmur was incidentally found on physical exam.  She denies having any episodes of chest pain or shortness of breath. She goes to water  aerobics on a regular basis.  No cough or cold symptoms.   No seasonal allergies   Has had breast cancer twice ( right breast)   September 02, 2020:   Elizabeth Ramirez is seen today for follow up of her heart murmur , she is [employer] partner .  Had a knee replacement   Sees Dr. Charolett Copes.   A recent CT scan showed some coronary artery calcifications Has enlarged lymph nodes.   Thought to have sarcoidosis Has been exercising .  No cp with exercise , no DOE , 30 minutes, 3 times a week   Echo from 2018: normal LV function , Mild MR, mild TR   Has lost 50 lbs from Jan. 2021 Is not interested in starting a statin  Has agreed to do a coronary calcium  score   December 31, 2021:   Elizabeth Ramirez is seen today for follow-up of her hyperlipidemia and coronary artery calcifications. Coronary calcium  score of 291. This was 31 percentile for age-, race-, and sex-matched controls She has tried multiple statins for her hyperlipidemia but she does not tolerate them.  She is also tried Pralulent  but does not tolerate that either.  Recent lab work from 2 days ago reveals an LDL of 157.  Total cholesterol is 235.  HDL is 65.  Triglyceride level is 77.  I would like  for her to try Repatha or Inclisiran    WBC is 1.7 ,  has seen gus Magranat in the past .   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 [facility] also send samples to [facility] for another opinion  Samples also sent to [facility]  Echo from [facility] , Shows normal LV function  Trivial MR   Is scheduled to possibly get Doxorubicin  which can be cardiotoxic . If it turns out that she is getting cardiotoxic medications it is likely that we will need her followed in our cardio oncology clinic in the advanced heart failure clinic.  Wt is 209 lbs  She has seen [facility] for lipid clinic and is now interesting in pursuing a lipid lowering medication We discussed delaying any new lipids meds for new.  November 16, 2023 Elizabeth Ramirez is seen for follow up of her HLT, Has been treated for lymphoma  R CHOP  Is finally feeling better after he chemo  Is trying to get back into exercise   Her last LDL was 165 CAC score is 291 ( 76 percentile for age / sex matched controls ) She tried a statin but developed severe muscle / joint pain  She has met with the lipid clinic and would like to return to discuss her options .   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  Additionally, no variants of uncertain significance (VUSs) were found.  The Breast/Ovarian gene panel offered by [facility] Elizabeth Blamer, MD) includes sequencing and deletion/duplication analysis for the following 19 genes:  ATM, BARD1, BRCA1, BRCA2, BRIP1, CDH1, CHEK2, FANCC, MLH1, MSH2, MSH6, NBN, PALB2, PMS2, PTEN, RAD51C, RAD51D, TP53, and XRCC2.  This panel also includes deletion/duplication analysis (without sequencing) for one gene, EPCAM.  Date of report is January 14, 2015.     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   Sarcoid 05/02/2021   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   Right proximal bridge of nose (CX35FU)   Seasonal allergies    Thyroid  disease    Vaccine counseling 05/02/2021   Vitamin D  deficiency     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   BREAST LUMPECTOMY Right 2016   BREAST LUMPECTOMY WITH NEEDLE LOCALIZATION Right 02/11/2015   Procedure: WIRE BRACKETED RIGHT BREAST LUMPECTOMY ;  Surgeon: Ayesha Lente, MD;  Location: [facility];  Service: General;  Laterality:  Right;   CATARACT EXTRACTION, BILATERAL  10-05-2017 left ; 11-09-2017 right   Dr Theodora Fish    colonoscopy with biopsy  12/28/2003   neg   EVACUATION BREAST HEMATOMA Left 03/11/2021   Procedure: LEFT AXILLARY SEROMA DRAINAGE AND DRAIN PLACEMENT;  Surgeon: Enid Harry, MD;  Location: [facility];  Service: General;  Laterality: Left;   LYMPH NODE DISSECTION Right 02/23/2020   Procedure: right inguinal lymph node excision;  Surgeon: Juanita Norlander, MD;  Location:  SURGERY CENTER;  Service: General;  Laterality: Right;   RADIOACTIVE SEED GUIDED AXILLARY SENTINEL LYMPH NODE Left 02/18/2021   Procedure: RADIOACTIVE SEED GUIDED LEFT AXILLARY LYMPH NODE EXCISION;  Surgeon: Enid Harry, MD;  Location: [facility];  Service: General;  Laterality: Left;   REVERSE SHOULDER ARTHROPLASTY Left 07/14/2018   Procedure: REVERSE SHOULDER ARTHROPLASTY;  Surgeon: Ellard Gunning, MD;  Location: [facility];  Service: Orthopedics;  Laterality: Left;  Interscalene Block   TONSILLECTOMY     1954   TOTAL KNEE ARTHROPLASTY Right 05/27/2015   Procedure: RIGHT TOTAL KNEE ARTHROPLASTY;  Surgeon: Liliane Rei, MD;  Location: [facility];  Service: Orthopedics;  Laterality: Right;   TOTAL KNEE ARTHROPLASTY Left 05/20/2020   Procedure: TOTAL KNEE ARTHROPLASTY;  Surgeon: Liliane Rei, MD;  Location: [facility];  Service: Orthopedics;  Laterality: Left;    WISDOM TOOTH EXTRACTION Bilateral 1965    Current Medications: Current Meds  Medication Sig   Calcium  Carb-Cholecalciferol 600-10 MG-MCG TABS Take by mouth.   levothyroxine  (SYNTHROID ) 112 MCG tablet Take 112 mcg by mouth daily before breakfast.   loratadine-pseudoephedrine  (CLARITIN-D 24-HOUR) 10-240 MG 24 hr tablet Take by mouth.   Current Facility-Administered Medications for the 11/16/23 encounter (Office Visit) with Mariha Sleeper, Lela Purple, MD  Medication   0.9 %  sodium chloride  infusion     Allergies:   Levofloxacin,  Tetracyclines & related, Other, Anesthesia s-i-40 [propofol ], Ciprofloxacin, Flonase [fluticasone], Ibuprofen, Nitrofurantoin  monohyd macro, Omeprazole, Praluent  [alirocumab ], Pravastatin , Rosuvastatin , and Scopolamine    Social History   Socioeconomic History   Marital status: Media planner    Spouse name: Not on file   Number of children: 0   Years of education: Not on file   Highest education level: Not on file  Occupational History   Not on file  Tobacco Use   Smoking status: Former    Current packs/day: 0.00    Average packs/day: 1 pack/day for 9.0 years (9.0 ttl pk-yrs)    Types: Cigarettes    Start date: 06/15/1974    Quit date: 06/16/1983    Years since quitting: 40.4   Smokeless tobacco: Never  Vaping Use   Vaping status: Never Used  Substance and Sexual Activity   Alcohol use: Yes    Alcohol/week: 2.0 standard drinks of alcohol    Types: 2 Glasses of wine per week    Comment: Twice a week   Drug use: No   Sexual activity: Not on file  Other Topics Concern   Not on file  Social History Narrative   Not on file   Social Drivers of Health   Financial Resource Strain: Not on file  Food Insecurity: Not on file  Transportation Needs: Not on file  Physical Activity: Not on file  Stress: Not on file  Social Connections: Not on file     Family History: The patient's family history includes Breast cancer in her cousin; Colitis in her paternal grandmother; Colon cancer in her cousin; Dementia in her mother; Diabetes in her brother, paternal aunt, and paternal grandfather; Emphysema in her paternal uncle; Heart Problems in her paternal aunt, paternal uncle, and paternal uncle; Heart attack in her maternal grandfather; Kidney failure in her paternal aunt; Leukemia (age of onset: 9) in her brother; Lung cancer in her paternal uncle; Multiple myeloma in her maternal aunt; Multiple myeloma (age of onset: 16) in her maternal aunt; Other in her paternal aunt; Other (age of onset:  58) in her mother; Ovarian cancer in her maternal aunt; Parkinson's disease in her maternal aunt; Pulmonary  fibrosis in her cousin, father, and paternal aunt; Stomach cancer in her maternal grandmother; Stroke in her paternal aunt and paternal grandfather; Thyroid  disease in her brother. ROS:   Please see the history of present illness.     All other systems reviewed and are negative.  EKGs/Labs/Other Studies Reviewed:      EKG:    EKG Interpretation Date/Time:  Tuesday November 16 2023 16:00:07 EDT Ventricular Rate:  75 PR Interval:  156 QRS Duration:  76 QT Interval:  400 QTC Calculation: 446 R Axis:   96  Text Interpretation: Normal sinus rhythm Rightward axis When compared with ECG of 12-Jul-2018 11:29, Questionable change in QRS axis Confirmed by Ahmad Alert ([id number]) on 11/16/2023 5:18:00 PM      Recent Labs: No results found for requested labs within last 365 days.  Recent Lipid Panel    Component Value Date/Time   CHOL 235 (H) 12/30/2021 1137   TRIG 77 12/30/2021 1137   HDL 65 12/30/2021 1137   CHOLHDL 3.6 12/30/2021 1137   LDLCALC 157 (H) 12/30/2021 1137    Physical Exam:     Physical Exam: Blood pressure 127/74, pulse 76, height 5\' 4"  (1.626 m), weight 213 lb 12.8 oz (97 kg), SpO2 94%.       GEN:  Well nourished, well developed in no acute distress HEENT: Normal NECK: No JVD; No carotid bruits LYMPHATICS: No lymphadenopathy CARDIAC: RRR , no murmurs, rubs, gallops RESPIRATORY:  Clear to auscultation without rales, wheezing or rhonchi  ABDOMEN: Soft, non-tender, non-distended MUSCULOSKELETAL:  No edema; No deformity  SKIN: Warm and dry NEUROLOGIC:  Alert and oriented x 3    ECG :    ASSESSMENT:    1. Mixed hyperlipidemia       PLAN:       Soft systolic murmur.   Has trivial MR and TR .  Continue to follow  2.  Coronary artery calcifications:     she has an elevated LDL and CAC .  Intolerent to statins, esp. Rosuvastatin   She has met with  the lipid clinic and would like to meet with them again     3.  Hyperlipidemia:  Will refer back to the lipid clinic    4.  B cell lymphoma:    She has been getting chemo this past year . Is finally recovering from it   Medication Adjustments/Labs and Tests Ordered: Current medicines are reviewed at length with the patient today.  Concerns regarding medicines are outlined above.  Orders Placed This Encounter  Procedures   AMB Referral to [facility] Pharm-D   EKG 12-Lead   No orders of the defined types were placed in this encounter.     Signed, Ahmad Alert, MD  11/16/2023 5:20 PM    [facility]

## 2023-11-16 ENCOUNTER — Encounter: Payer: Self-pay | Admitting: Cardiovascular Disease

## 2023-11-16 ENCOUNTER — Ambulatory Visit: Attending: Cardiovascular Disease | Admitting: Cardiovascular Disease

## 2023-11-16 VITALS — BP 127/74 | HR 76 | Ht 64.0 in | Wt 213.8 lb

## 2023-11-16 DIAGNOSIS — E782 Mixed hyperlipidemia: Secondary | ICD-10-CM | POA: Insufficient documentation

## 2023-11-16 DIAGNOSIS — R011 Cardiac murmur, unspecified: Secondary | ICD-10-CM | POA: Diagnosis not present

## 2023-11-16 NOTE — Patient Instructions (Signed)
 Testing/Procedures: Ambulatory referral to lipid clinic  Follow-Up: At [facility], you and your health needs are our priority.  As part of our continuing mission to provide you with exceptional heart care, our providers are all part of one team.  This team includes your primary Cardiologist (physician) and Advanced Practice Providers or APPs (Physician Assistants and Nurse Practitioners) who all work together to provide you with the care you need, when you need it.  Your next appointment:   1 year(s)  Provider:   Christena Covert, MD

## 2023-12-23 ENCOUNTER — Telehealth: Payer: Self-pay | Admitting: Hematology and Oncology

## 2023-12-23 NOTE — Telephone Encounter (Signed)
 Rescheule appointment per provider pal.  Called left VM with changes made to the upcoming appointment.

## 2024-01-05 ENCOUNTER — Ambulatory Visit: Attending: Cardiovascular Disease | Admitting: Pharmacist

## 2024-01-05 DIAGNOSIS — E782 Mixed hyperlipidemia: Secondary | ICD-10-CM | POA: Diagnosis not present

## 2024-01-05 NOTE — Progress Notes (Signed)
 Patient ID: Elizabeth Ramirez                 DOB: 08/09/1945                    MRN: [id number]      HPI: Elizabeth Ramirez is a 78 y.o. female patient referred to lipid clinic by Dr. Alveta. PMH is significant for hypothyroidism, breast cancer, obesity, B cell lymphoma, Coronary calcium  score of 291. This was 76 percentile for age-,race-, and sex-matched controls  At PharmD clinic on 01/23/21 patient shared that she did not start rosuvastatin  until after lipids were checked in June and LDL was elevated at 153. She experienced joint and muscle pain 2 weeks after starting statin. She continued rosuvastatin  for a month before stopping and symptoms resolved upon discontinuation. At this visit she also shared she had lost 70 lbs in the past few years, 20 from [employer] and 50 from [employer]. At this visit pt was willing to try pravastatin  20 mg, which she preferred to take every other day for the first 2 weeks. Patient then stopped taking pravastatin  after more than 2 weeks due to muscle and joint pain, skin rash and constipation. She was then started on Praluent  after September 6th 2022, which she later stopped due to severe joint problems. Lipid panel on 12/30/21 showed LDL of 157 off of LLT.   Patient underwent chemotherapy for B Cell lymphoma and lipid therapy was placed on the back burner for a little while. She presents today to re-discuss her options.  We discussed her estimated 10-year risk  This individual has an estimated 10-year risk of ASCVD = 11.1% (prevent calculator) 10-year ASCVD risk using MESA 8.9%  We discussed medication options.  Patient not interested in revisiting any statins.  She had tendon issues with fluoroquinolones therefore not a good candidate for Nexletol.  We did discuss Repatha and Leqvio.  Current Medications: none Intolerances:  rosuvastatin  10mg  daily - muscle pain and weakness, pravastatin  20 mg (muscle and joint pain, skin rash and constipation), Praluent  75mg   (severe joint problems)  Risk Factors:  LDL-C goal:  ApoB goal:   Diet: Noom Drinks water  and coffee  Exercise: better 5 exercises 20-25 min 3-4x per week  Family History:  Family History  Problem Relation Age of Onset   Dementia Mother    Other Mother 2       spot on lung; secondhand smoke exposure   Pulmonary fibrosis Father    Diabetes Brother    Thyroid  disease Brother    Leukemia Brother 61       chronic lymphocytic    Stomach cancer Maternal Grandmother        dx. 30s   Heart attack Maternal Grandfather    Diabetes Paternal Grandfather    Stroke Paternal Grandfather    Colitis Paternal Grandmother    Parkinson's disease Maternal Aunt    Diabetes Paternal Aunt    Other Paternal Aunt        bowel obstruction   Heart Problems Paternal Uncle    Ovarian cancer Maternal Aunt        dx. 41s; paternal half sister of her mother   Multiple myeloma Maternal Aunt    Multiple myeloma Maternal Aunt 26   Kidney failure Paternal Aunt    Pulmonary fibrosis Paternal Aunt    Stroke Paternal Aunt    Heart Problems Paternal Aunt    Emphysema Paternal Uncle    Heart Problems Paternal  Uncle    Lung cancer Paternal Uncle        smoker   Colon cancer Cousin    Breast cancer Cousin        dx. 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  Additionally, no variants of uncertain significance (VUSs) were found.  The Breast/Ovarian gene panel offered by [facility] [name], MD) includes sequencing and deletion/duplication analysis for the following 19 genes:  ATM, BARD1, BRCA1, BRCA2, BRIP1, CDH1, CHEK2, FANCC, MLH1, MSH2, MSH6, NBN, PALB2, PMS2, PTEN, RAD51C, RAD51D, TP53, and XRCC2.  This panel also includes deletion/duplication analysis (without sequencing) for one gene, EPCAM.  Date of report is January 14, 2015.     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     levothyroxine  (SYNTHROID ) 112 MCG tablet Take 112 mcg by mouth daily before breakfast.     loratadine-pseudoephedrine  (CLARITIN-D  24-HOUR) 10-240 MG 24 hr tablet Take by mouth.     Current Facility-Administered Medications on File Prior to Visit  Medication Dose Route Frequency Provider Last Rate Last Admin   0.9 %  sodium chloride  infusion   Intravenous PRN Thompson, Kathryn R, PA-C        Allergies  Allergen Reactions   Levofloxacin Other (See Comments)    Affected tendons, severe pain and swelling   Tetracyclines & Related  Swelling    tongue swelling and peeling   Other Diarrhea, Rash and Other (See Comments)    Crabs - diarrhea. Bay leaves - rash in her mouth   Anesthesia S-I-40 [Propofol ]     Other reaction(s): respiratory failure requiring rescue meds   Ciprofloxacin     Tendon problems    Flonase [Fluticasone]     Nasal spray cause nose bleeds   Ibuprofen     Gets mouth ulcers if she takes for more than 3 days; Aleve  okay    Nitrofurantoin  Monohyd Macro Hives   Omeprazole Other (See Comments)   Praluent  [Alirocumab ]     Joint pain   Pravastatin      Muscle aches, rash, constipation on 20mg  every other day dosing   Rosuvastatin      Muscle and joint pain on 10mg  daily dosing   Scopolamine      Headache and redness of eyes    Assessment/Plan:  1. Hyperlipidemia -  Hyperlipidemia Assessment: LDL-C 165 off of lipid-lowering therapy Patient with several intolerances to medications Not willing to retrial statins Had muscle aches to Praluent -we discussed that we could still try Repatha Reviewed [place] is concerned that it is a long-acting medication and she would have a side effect for a long time.  We did discuss that theoretically drug is only in bloodstream for 48 hours therefore side effects should not be prolonged She is not a good candidate due to tendon issues with fluoroquinolones She does better 5 exercises for about 20 or 25 minutes 3-4 times a week, with a long-term goal of increasing that to 5 times a week Has been doing Noom for several years.  At 1 point lost 70 pounds has kept 60  of it off We reviewed her intermediate ASCVD risk based off of the prevent calculator and the [facility] risk score using her coronary calcium  score  Plan: Patient would like to do her own research on medications and get back to me     Thank you,  Evin Loiseau D Shanzay Hepworth, Pharm.Elizabeth Ramirez, CPP [facility] A Division of [facility] [address], [place], [place] [address]  Phone: [phone]; Fax: [phone]

## 2024-01-05 NOTE — Assessment & Plan Note (Signed)
 Assessment: LDL-C 165 off of lipid-lowering therapy Patient with several intolerances to medications Not willing to retrial statins Had muscle aches to Praluent -we discussed that we could still try Repatha Reviewed [place] is concerned that it is a long-acting medication and she would have a side effect for a long time.  We did discuss that theoretically drug is only in bloodstream for 48 hours therefore side effects should not be prolonged She is not a good candidate due to tendon issues with fluoroquinolones She does better 5 exercises for about 20 or 25 minutes 3-4 times a week, with a long-term goal of increasing that to 5 times a week Has been doing Noom for several years.  At 1 point lost 70 pounds has kept 60 of it off We reviewed her intermediate ASCVD risk based off of the prevent calculator and the [facility] risk score using her coronary calcium  score  Plan: Patient would like to do her own research on medications and get back to me

## 2024-01-05 NOTE — Patient Instructions (Addendum)
 Repatha-q 2 week injection  Leqvio- q 6 month injection  You can call me at [phone] if you have any questions/or send me a mychart

## 2024-02-10 ENCOUNTER — Ambulatory Visit
Admission: RE | Admit: 2024-02-10 | Discharge: 2024-02-10 | Disposition: A | Discharge status: Home / Self Care | Source: Ambulatory Visit | Attending: Hematology and Oncology

## 2024-02-10 DIAGNOSIS — Z170 Estrogen receptor positive status [ER+]: Secondary | ICD-10-CM

## 2024-02-10 DIAGNOSIS — Z1231 Encounter for screening mammogram for malignant neoplasm of breast: Secondary | ICD-10-CM

## 2024-02-21 ENCOUNTER — Ambulatory Visit: Payer: Medicare Other | Admitting: Hematology and Oncology

## 2024-02-22 ENCOUNTER — Ambulatory Visit: Admitting: Hematology and Oncology

## 2024-02-23 ENCOUNTER — Ambulatory Visit: Admitting: Hematology and Oncology

## 2024-03-06 ENCOUNTER — Inpatient Hospital Stay: Attending: Hematology and Oncology | Admitting: Hematology and Oncology

## 2024-03-06 VITALS — BP 122/82 | HR 81 | Temp 98.5°F | Resp 14 | Wt 215.4 lb

## 2024-03-06 DIAGNOSIS — Z806 Family history of leukemia: Secondary | ICD-10-CM | POA: Insufficient documentation

## 2024-03-06 DIAGNOSIS — Z86000 Personal history of in-situ neoplasm of breast: Secondary | ICD-10-CM | POA: Insufficient documentation

## 2024-03-06 DIAGNOSIS — Z8041 Family history of malignant neoplasm of ovary: Secondary | ICD-10-CM | POA: Insufficient documentation

## 2024-03-06 DIAGNOSIS — Z801 Family history of malignant neoplasm of trachea, bronchus and lung: Secondary | ICD-10-CM | POA: Diagnosis not present

## 2024-03-06 DIAGNOSIS — Z803 Family history of malignant neoplasm of breast: Secondary | ICD-10-CM | POA: Diagnosis not present

## 2024-03-06 DIAGNOSIS — Z1231 Encounter for screening mammogram for malignant neoplasm of breast: Secondary | ICD-10-CM | POA: Diagnosis not present

## 2024-03-06 DIAGNOSIS — C858A Other specified types of non-Hodgkin lymphoma, in remission: Secondary | ICD-10-CM | POA: Insufficient documentation

## 2024-03-06 DIAGNOSIS — Z87891 Personal history of nicotine dependence: Secondary | ICD-10-CM | POA: Insufficient documentation

## 2024-03-06 DIAGNOSIS — Z170 Estrogen receptor positive status [ER+]: Secondary | ICD-10-CM

## 2024-03-06 DIAGNOSIS — Z807 Family history of other malignant neoplasms of lymphoid, hematopoietic and related tissues: Secondary | ICD-10-CM | POA: Insufficient documentation

## 2024-03-06 DIAGNOSIS — Z800 Family history of malignant neoplasm of digestive organs: Secondary | ICD-10-CM | POA: Diagnosis not present

## 2024-03-06 DIAGNOSIS — C50411 Malignant neoplasm of upper-outer quadrant of right female breast: Secondary | ICD-10-CM | POA: Diagnosis not present

## 2024-03-06 NOTE — Progress Notes (Signed)
 [facility]  [phone] [phone]    ID: Elizabeth Ramirez DOB: 02/09/1946  MR#: [id number]  RDW#:[id number]  Patient Care Team: Aisha Harvey, MD as PCP - General (Family Medicine) Floretta Mallard, MD as PCP - Cardiology (Cardiology) Sarrah Browning, MD as Consulting Physician (Obstetrics and Gynecology) Melita Drivers, MD as Consulting Physician (Orthopedic Surgery) Ebbie Cough, MD as Consulting Physician (General Surgery) Loretha Ash, MD as Consulting Physician (Hematology and Oncology)  CHIEF COMPLAINT:  marginal zone lymphoma   CURRENT TREATMENT: Continued observation  INTERVAL HISTORY:  Discussed the use of AI scribe software for clinical note transcription with the patient, who gave verbal consent to proceed.  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 She underwent R-CHOP chemotherapy three times in 2024, achieving a complete response followed by de escalation to RCVP at [facility]. Her current management involves active surveillance with follow-up appointments every three months. She has no new symptoms and her recent lab results show normal blood counts, although there is some kidney injury noted, which has been an ongoing issue.  She has a history of breast cancer diagnosed in 2005 and again in 2018. She completed treatment with Anastrozole  in 2010 after the first diagnosis. Following the second diagnosis, she was unable to tolerate the medications prescribed. She is not currently on any medication for breast cancer. Her recent mammogram was normal.  She has a history of a left exophytic lesion on the kidney, which has been monitored without intervention. No new symptoms such as fever,  drenching sweats, or changes in appetite.  She engages in physical activity, walking regularly, although not consistently for 30 minutes a day.  She mentions her brother passed away about a month ago, which has been a source of sadness for her.  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 This was estrogen and progesterone receptor positive, HER-2 not amplified. Margins were ample. She participated in the MA-27 study and received anastrozole  between June 2005 and June 2010, at which time she was released from follow-up.  More recently, [name] had screening bilateral mammography showing a suspicious area in the right breast and on 12/14/2014 underwent right diagnostic mammography at the breast Center. The breast density was category A. There was a group of pleomorphic calcifications in the upper central right breast spanning up to 2.8 cm. Biopsy of this area was obtained 12/21/2014 ([id number]) and showed ductal carcinoma in situ, grade 2 or 3, estrogen receptor 95% positive, and progesterone receptor 60% positive, both with strong staining intensity.  The patient's case was presented at the multidisciplinary breast cancer conference 01/02/2015. At that time a preliminary plan was proposed, namely genetics counseling and if no mutation noted breast MRI to be followed by lumpectomy, radiation, and anti-estrogens.  Her subsequent history is as detailed below.  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 Two clonal B cell populations by flow cytometry, CD10+ and CD10- each population  23%. Negative for BCL2 or BCL6 rearrangement. IGH::MYC  rearrangment or other MYC rearrangement by FISH.  REVIEW BY [facility] 10/15/2022: Marginal zone lymphoma with florid reactive germinal centers. REVIEW BY [facility] 12/03/2022: Reactive follicular hyperplasia with increased interfollicular B-cells suggestive of involvement by nodal marginal zone lymphoma. 09/10/2022: Echo - EF > 55%, Normal LV systolic function  10/15/2022: Cycle 1 R-CHOP with growth factor support  11/05/2022: Cycle 2 R-CHOP with G-CSF support  11/26/2022: Cycle 3 R-CHOP with G-CSF support 12/15/2022: PET/CT - Resolved hypermetabolic lymphadenopathy ([place] 1).  2. Resolved right upper lobe pulmonary nodule, likely infectious or inflammatory. 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  Additionally, no variants of uncertain significance (VUSs) were found.  The Breast/Ovarian gene panel offered by [facility] [name], MD) includes sequencing and deletion/duplication analysis for the following 19 genes:  ATM, BARD1, BRCA1, BRCA2, BRIP1, CDH1, CHEK2, FANCC, MLH1, MSH2, MSH6, NBN, PALB2, PMS2, PTEN, RAD51C, RAD51D, TP53, and XRCC2.  This panel also includes deletion/duplication analysis (without sequencing) for one gene, EPCAM.  Date of report is January 14, 2015.     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/27/2015   Sarcoid 05/02/2021   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   Right proximal bridge of nose ([id number])   Seasonal allergies    Thyroid  disease    Vaccine counseling 05/02/2021   Vitamin D  deficiency     PAST SURGICAL HISTORY: 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   BREAST LUMPECTOMY Right 2016   BREAST LUMPECTOMY WITH NEEDLE LOCALIZATION Right 02/11/2015   Procedure: WIRE BRACKETED RIGHT BREAST LUMPECTOMY ;  Surgeon: Morene Olives, MD;  Location: [facility];  Service: General;  Laterality: Right;   CATARACT EXTRACTION, BILATERAL  10-05-2017 left ; 11-09-2017 right   Dr enis Burkitt    colonoscopy with biopsy  12/28/2003   neg   EVACUATION BREAST HEMATOMA Left 03/11/2021   Procedure: LEFT AXILLARY SEROMA DRAINAGE AND DRAIN PLACEMENT;  Surgeon: Ebbie Cough, MD;  Location: [facility];  Service: General;  Laterality:  Left;   LYMPH NODE DISSECTION Right 02/23/2020   Procedure: right inguinal lymph node excision;  Surgeon: Ethyl Lenis, MD;  Location: [facility];  Service: General;  Laterality: Right;   RADIOACTIVE SEED GUIDED AXILLARY SENTINEL LYMPH NODE Left 02/18/2021   Procedure: RADIOACTIVE SEED GUIDED LEFT AXILLARY LYMPH NODE EXCISION;  Surgeon: Ebbie Cough, MD;  Location: [facility];  Service: General;  Laterality: Left;   REVERSE SHOULDER ARTHROPLASTY Left 07/14/2018   Procedure: REVERSE SHOULDER ARTHROPLASTY;  Surgeon: Melita Drivers, MD;  Location: [facility];  Service: Orthopedics;  Laterality: Left;  Interscalene Block   TONSILLECTOMY     1954   TOTAL KNEE ARTHROPLASTY Right 05/27/2015   Procedure: RIGHT TOTAL KNEE ARTHROPLASTY;  Surgeon: Dempsey Moan, MD;  Location: [facility];  Service: Orthopedics;  Laterality: Right;   TOTAL KNEE ARTHROPLASTY Left 05/20/2020   Procedure: TOTAL KNEE ARTHROPLASTY;  Surgeon: Moan Dempsey, MD;  Location: [facility];  Service: Orthopedics;  Laterality: Left;    WISDOM TOOTH EXTRACTION Bilateral 1965    FAMILY HISTORY Family History  Problem Relation Age of Onset   Dementia Mother    Other Mother 31       spot on lung; secondhand smoke exposure   Pulmonary fibrosis Father    Diabetes Brother    Thyroid  disease Brother    Leukemia Brother 40       chronic lymphocytic    Stomach cancer Maternal Grandmother        dx. 30s   Heart attack Maternal Grandfather    Diabetes Paternal Grandfather    Stroke Paternal Grandfather    Colitis Paternal Grandmother    Parkinson's disease Maternal Aunt    Diabetes Paternal Aunt    Other Paternal Aunt        bowel obstruction   Heart Problems Paternal Uncle    Ovarian cancer Maternal Aunt        dx. 50s; paternal half sister of her mother   Multiple myeloma Maternal Aunt    Multiple myeloma Maternal Aunt 64   Kidney failure Paternal Aunt    Pulmonary fibrosis Paternal Aunt    Stroke Paternal Aunt    Heart Problems Paternal Aunt    Emphysema Paternal Uncle    Heart Problems Paternal Uncle    Lung cancer Paternal Uncle        smoker   Colon cancer Cousin    Breast cancer Cousin        dx. 13s   Pulmonary fibrosis Arzella Slocumb father died at the age of 33 from pulmonary fibrosis. Her mother died at age 69 with severe dementia. The patient had one brother no sisters.  Her  brother has Parkinson's disease and chronic lymphoid leukemia.  Her maternal grandmother died from stomach cancer the age of 74. The patient's mother had a half sister with ovarian cancer.   GYNECOLOGIC HISTORY:  No LMP recorded. Patient is postmenopausal.  menarche age 54, she isGX P0. Menopause 2003. 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 At home it's her and Stephane Sayres , who also is in real estate, with her own investment business.SABRA Pool is a Catholic, though not currently practicing    ADVANCED DIRECTIVES:  Stephane is Clinical biochemist of attorney   HEALTH MAINTENANCE: Social History   Tobacco Use   Smoking status:  Former    Current packs/day: 0.00    Average packs/day: 1 pack/day for 9.0 years (9.0 ttl pk-yrs)    Types: Cigarettes    Start date: 06/15/1974    Quit date: 06/16/1983    Years since quitting: 40.7   Smokeless tobacco: Never  Vaping Use   Vaping status: Never Used  Substance Use Topics   Alcohol use: Yes    Alcohol/week: 2.0 standard drinks of alcohol    Types: 2 Glasses of wine per week    Comment: Twice a week   Drug use: No     Colonoscopy:  20/15, [place] she knee  PAP: up-to-date/[facility]  Bone density: October 2016; normal  Lipid panel:  Allergies  Allergen Reactions   Levofloxacin Other (See Comments)    Affected tendons, severe pain and swelling   Tetracyclines & Related Swelling    tongue swelling and peeling   Other Diarrhea, Rash and Other (See Comments)    Crabs - diarrhea. Bay leaves - rash in her mouth   Anesthesia S-I-40 [Propofol ]     Other reaction(s): respiratory failure requiring rescue meds   Ciprofloxacin     Tendon problems    Flonase [Fluticasone]     Nasal spray cause nose bleeds   Ibuprofen     Gets mouth ulcers if she takes for more than 3 days; Aleve  okay    Nitrofurantoin   Monohyd Macro Hives   Omeprazole Other (See Comments)   Praluent  [Alirocumab ]     Joint pain   Pravastatin      Muscle aches, rash, constipation on 20mg  every other day dosing   Rosuvastatin      Muscle and joint pain on 10mg  daily dosing   Scopolamine      Headache and redness of eyes    Current Outpatient Medications  Medication Sig Dispense Refill   Calcium  Carb-Cholecalciferol 600-10 MG-MCG TABS Take by mouth.     famotidine  (PEPCID ) 20 MG tablet Take 20 mg by mouth daily.     levothyroxine  (SYNTHROID ) 112 MCG tablet Take 112 mcg by mouth daily before breakfast.     levothyroxine  (SYNTHROID ) 112 MCG tablet Take 112 mcg by mouth daily before breakfast.     loratadine-pseudoephedrine  (CLARITIN-D 24-HOUR) 10-240 MG 24 hr tablet Take by mouth.     valACYclovir (VALTREX) 1000 MG tablet Take 500 mg by mouth as needed.     Current Facility-Administered Medications  Medication Dose Route Frequency Provider Last Rate Last Admin   0.9 %  sodium chloride  infusion   Intravenous PRN Sebastian Lamarr SAUNDERS, PA-C        OBJECTIVE: White woman in no acute distress  Vitals:   03/06/24 1038  BP: 122/82  Pulse: 81  Resp: 14  Temp: 98.5 F (36.9 C)  SpO2: 97%        Body mass index is 36.97 kg/m.    ECOG FS:1 - Symptomatic but completely ambulatory  Physical Exam Constitutional:      Appearance: Normal appearance.  Cardiovascular:     Rate and Rhythm: Normal rate and regular rhythm.  Pulmonary:     Effort: Pulmonary effort is normal.     Breath sounds: Normal breath sounds.  Abdominal:     General: Abdomen is flat.     Palpations: Abdomen is soft.  Musculoskeletal:     Cervical back: Normal range of motion and neck supple. No rigidity.  Skin:    General: Skin is warm and dry.  Neurological:     General: No focal deficit present.  Mental Status: She is alert.    Pt refused breast exam  RESULTS: MM 3D SCREENING MAMMOGRAM BILATERAL BREAST Result Date: 02/16/2024 CLINICAL  DATA:  Screening. EXAM: DIGITAL SCREENING BILATERAL MAMMOGRAM WITH TOMOSYNTHESIS AND CAD TECHNIQUE: Bilateral screening digital craniocaudal and mediolateral oblique mammograms were obtained. Bilateral screening digital breast tomosynthesis was performed. The images were evaluated with computer-aided detection. COMPARISON:  Previous exam(s). ACR Breast Density Category b: There are scattered areas of fibroglandular density. FINDINGS: There are no findings suspicious for malignancy. IMPRESSION: No mammographic evidence of malignancy. A result letter of this screening mammogram will be mailed directly to the patient. RECOMMENDATION: Screening mammogram in one year. (Code:SM-B-01Y) BI-RADS CATEGORY  1: Negative. Electronically Signed   By: Dina  Arceo M.D.   On: 02/16/2024 05:10     CMP     Component Value Date/Time   NA 140 07/16/2022 1600   NA 141 12/30/2021 1137   K 4.5 07/16/2022 1600   CL 105 07/16/2022 1600   CO2 28 07/16/2022 1600   GLUCOSE 79 07/16/2022 1600   BUN 17 07/16/2022 1600   BUN 16 12/30/2021 1137   CREATININE 1.06 (H) 07/16/2022 1600   CALCIUM  9.8 07/16/2022 1600   PROT 6.7 07/16/2022 1600   PROT 6.3 12/30/2021 1137   ALBUMIN 4.2 07/16/2022 1600   ALBUMIN 4.5 12/30/2021 1137   AST 16 07/16/2022 1600   ALT 16 07/16/2022 1600   ALKPHOS 67 07/16/2022 1600   BILITOT 1.2 07/16/2022 1600   GFRNONAA 38 11/09/2023 0000   GFRAA 51 (L) 03/01/2020 1447   GFRAA 47 (L) 01/26/2020 1220    INo results found for: SPEP, UPEP  Lab Results  Component Value Date   WBC 3.4 (L) 07/16/2022   NEUTROABS 1.7 07/16/2022   HGB 14.0 07/16/2022   HCT 39.4 07/16/2022   MCV 88.7 07/16/2022   PLT 111 (L) 07/16/2022      Chemistry      Component Value Date/Time   NA 140 07/16/2022 1600   NA 141 12/30/2021 1137   K 4.5 07/16/2022 1600   CL 105 07/16/2022 1600   CO2 28 07/16/2022 1600   BUN 17 07/16/2022 1600   BUN 16 12/30/2021 1137   CREATININE 1.06 (H) 07/16/2022 1600       Component Value Date/Time   CALCIUM  9.8 07/16/2022 1600   ALKPHOS 67 07/16/2022 1600   AST 16 07/16/2022 1600   ALT 16 07/16/2022 1600   BILITOT 1.2 07/16/2022 1600       Lab Results  Component Value Date   LABCA2 22 12/04/2008    No components found for: [id number]  No results for input(s): INR in the last 168 hours.  Urinalysis    Component Value Date/Time   COLORURINE YELLOW 05/20/2015 1120   APPEARANCEUR Clear 07/14/2022 1326   LABSPEC 1.009 05/20/2015 1120   PHURINE 6.5 05/20/2015 1120   GLUCOSEU Negative 07/14/2022 1326   HGBUR NEGATIVE 05/20/2015 1120   BILIRUBINUR Negative 07/14/2022 1326   KETONESUR NEGATIVE 05/20/2015 1120   PROTEINUR Negative 07/14/2022 1326   PROTEINUR NEGATIVE 05/20/2015 1120   UROBILINOGEN 0.2 12/19/2014 1427   NITRITE Negative 07/14/2022 1326   NITRITE NEGATIVE 05/20/2015 1120   LEUKOCYTESUR Negative 07/14/2022 1326    STUDIES: MM 3D SCREENING MAMMOGRAM BILATERAL BREAST Result Date: 02/16/2024 CLINICAL DATA:  Screening. EXAM: DIGITAL SCREENING BILATERAL MAMMOGRAM WITH TOMOSYNTHESIS AND CAD TECHNIQUE: Bilateral screening digital craniocaudal and mediolateral oblique mammograms were obtained. Bilateral screening digital breast tomosynthesis was performed. The images were evaluated with  computer-aided detection. COMPARISON:  Previous exam(s). ACR Breast Density Category b: There are scattered areas of fibroglandular density. FINDINGS: There are no findings suspicious for malignancy. IMPRESSION: No mammographic evidence of malignancy. A result letter of this screening mammogram will be mailed directly to the patient. RECOMMENDATION: Screening mammogram in one year. (Code:SM-B-01Y) BI-RADS CATEGORY  1: Negative. Electronically Signed   By: Dina  Arceo M.D.   On: 02/16/2024 05:10     ASSESSMENT/PLAN  # 1 Right breast IDC cancer: 78 y.o. 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  She also remembers trying letrozole  which she once again caused her severe arthralgias.  She had genetic testing back in 2016 and it was repeated in 2022 with no deleterious mutations there was VUS identified in ALK gene.  She is doing well from breast cancer stand point. She refused breast exam today Last mammogram neg for malignancy RTC in 1 yr.  # 2 Nodal marginal zone lymphoma.  She was treated at [facility] with 3 cycles of R-CHOP and interim PET which showed complete response and she had 3 more cycles of R-CVP.  She had end of treatment PET which showed complete response.   Most recent visit at [facility], continue surveillance. No ROS or PE concerns today Continue follow up with [facility] per pt preference  She wants to continue follow up with us  once a yr.  Time spent: 30 min  *Total Encounter Time as defined by the Centers for Medicare and Medicaid Services includes, in addition to the face-to-face time of a patient visit (documented in the note above) non-face-to-face time: obtaining and reviewing outside history, ordering and reviewing medications, tests or procedures, care coordination (communications with other health care professionals or caregivers) and documentation in the medical record.

## 2025-03-06 ENCOUNTER — Ambulatory Visit: Admitting: Hematology and Oncology
